# Patient Record
Sex: Male | Born: 1965 | Race: Black or African American | Hispanic: No | Marital: Single | Smoking: Never smoker
Health system: Southern US, Community
[De-identification: ages and names within clinical notes are randomized; demographics above are authoritative.]

## PROBLEM LIST (undated history)

## (undated) DIAGNOSIS — K219 Gastro-esophageal reflux disease without esophagitis: Secondary | ICD-10-CM

## (undated) DIAGNOSIS — M549 Dorsalgia, unspecified: Secondary | ICD-10-CM

## (undated) DIAGNOSIS — I480 Paroxysmal atrial fibrillation: Secondary | ICD-10-CM

## (undated) DIAGNOSIS — Z9989 Dependence on other enabling machines and devices: Secondary | ICD-10-CM

## (undated) DIAGNOSIS — I82621 Acute embolism and thrombosis of deep veins of right upper extremity: Secondary | ICD-10-CM

## (undated) DIAGNOSIS — I5189 Other ill-defined heart diseases: Secondary | ICD-10-CM

## (undated) DIAGNOSIS — G4733 Obstructive sleep apnea (adult) (pediatric): Secondary | ICD-10-CM

## (undated) DIAGNOSIS — K420 Umbilical hernia with obstruction, without gangrene: Secondary | ICD-10-CM

## (undated) DIAGNOSIS — E785 Hyperlipidemia, unspecified: Secondary | ICD-10-CM

## (undated) DIAGNOSIS — J45909 Unspecified asthma, uncomplicated: Secondary | ICD-10-CM

## (undated) DIAGNOSIS — I10 Essential (primary) hypertension: Secondary | ICD-10-CM

## (undated) DIAGNOSIS — Z8709 Personal history of other diseases of the respiratory system: Secondary | ICD-10-CM

## (undated) HISTORY — DX: Umbilical hernia with obstruction, without gangrene: K42.0

## (undated) HISTORY — DX: Dorsalgia, unspecified: M54.9

## (undated) HISTORY — PX: OTHER SURGICAL HISTORY: SHX169

## (undated) HISTORY — DX: Unspecified asthma, uncomplicated: J45.909

## (undated) HISTORY — DX: Other ill-defined heart diseases: I51.89

## (undated) HISTORY — DX: Paroxysmal atrial fibrillation: I48.0

## (undated) HISTORY — DX: Gastro-esophageal reflux disease without esophagitis: K21.9

## (undated) HISTORY — DX: Acute embolism and thrombosis of deep veins of right upper extremity: I82.621

## (undated) HISTORY — DX: Hyperlipidemia, unspecified: E78.5

## (undated) HISTORY — DX: Morbid (severe) obesity due to excess calories: E66.01

## (undated) HISTORY — PX: CYST REMOVAL NECK: SHX6281

## (undated) HISTORY — DX: Essential (primary) hypertension: I10

## (undated) HISTORY — DX: Dependence on other enabling machines and devices: Z99.89

---

## 2005-04-01 ENCOUNTER — Ambulatory Visit: Payer: Self-pay | Admitting: Family Medicine

## 2006-07-28 ENCOUNTER — Ambulatory Visit: Payer: Self-pay | Admitting: Family Medicine

## 2006-09-03 ENCOUNTER — Ambulatory Visit: Payer: Self-pay | Admitting: Family Medicine

## 2006-12-18 ENCOUNTER — Ambulatory Visit: Payer: Self-pay | Admitting: Family Medicine

## 2007-04-14 ENCOUNTER — Ambulatory Visit: Payer: Self-pay | Admitting: Family Medicine

## 2013-02-02 ENCOUNTER — Encounter: Payer: Self-pay | Admitting: *Deleted

## 2013-02-02 ENCOUNTER — Encounter: Payer: Self-pay | Admitting: Cardiology

## 2013-02-02 ENCOUNTER — Ambulatory Visit (INDEPENDENT_AMBULATORY_CARE_PROVIDER_SITE_OTHER): Payer: BC Managed Care – PPO | Admitting: Cardiology

## 2013-02-02 VITALS — BP 140/91 | HR 104 | Ht 71.0 in | Wt 278.0 lb

## 2013-02-02 DIAGNOSIS — R0602 Shortness of breath: Secondary | ICD-10-CM

## 2013-02-02 DIAGNOSIS — I10 Essential (primary) hypertension: Secondary | ICD-10-CM

## 2013-02-02 NOTE — Assessment & Plan Note (Signed)
I think this is probably secondary to his being extremely overweight and lack of conditioning. He could also be increasing his blood pressure remarkably with early exercise. I have arranged him to have an exercise treadmill with oximetry. I suspect that his heart rate and blood pressure would increase early into exercise. If this is unremarkable except for deconditioning, we will followup when necessary. I've made no changes in his medications.  Have encouraged him to lose weight and begin a walking program.

## 2013-02-02 NOTE — Assessment & Plan Note (Signed)
Most likely suboptimal control. He's been advised with weight loss, regular walking, and salt restriction. Recent med changes noted by Dr. Lysbeth Galas. Followup with primary care.

## 2013-02-02 NOTE — Patient Instructions (Addendum)
Your physician recommends that you schedule a follow-up appointment in: AS NEEDED  Your physician has requested that you have an exercise tolerance test. For further information please visit [email or web address] Please also follow instruction sheet, as given.WE WILL CALL YOU WITH THE RESULTS

## 2013-02-02 NOTE — Progress Notes (Signed)
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 Lysbeth Galas for increased dyspnea on exertion and some chest discomfort. He has noted increased shortness of breath early into activity over the past couple months. If he continues to walk he usually gets better. He denies any angina with this. He denies orthopnea, PND or edema. Echocardiogram in April 2013 was normal. There was no sign of pulmonary retention. He does have a history of hypertension which is not well-controlled today. He has gained a substantial amount weight in the last couple years. His BMI is over 35.  He denies any presyncope or syncope with exercise.  He has hyperlipidemia and labs reviewed from Dr. Lysbeth Galas office with patient. Recent med changes noted by Dr. Lysbeth Galas.  Past Medical History  Diagnosis Date  . HTN (hypertension)   . GERD (gastroesophageal reflux disease)   . Back pain     Current Outpatient Prescriptions  Medication Sig Dispense Refill  . atorvastatin (LIPITOR) 10 MG tablet Take 10 mg by mouth daily.      Marland Kitchen esomeprazole (NEXIUM) 40 MG capsule Take 40 mg by mouth daily before breakfast.      . furosemide (LASIX) 20 MG tablet Take 20 mg by mouth 2 (two) times daily.      Marland Kitchen losartan-hydrochlorothiazide (HYZAAR) 100-25 MG per tablet Take 1 tablet by mouth daily.      . meloxicam (MOBIC) 15 MG tablet Take 15 mg by mouth daily.      Marland Kitchen spironolactone (ALDACTONE) 25 MG tablet Take 25 mg by mouth daily.       No current facility-administered medications for this visit.    No Known Allergies  No family history on file.  History   Social History  . Marital Status: Single    Spouse Name: N/A    Number of Children: N/A  . Years of Education: N/A   Occupational History  . Not on file.   Social History Main Topics  . Smoking status: Never Smoker   . Smokeless tobacco: Not on file  . Alcohol Use: Yes     Comment: Socially  . Drug Use: Not on file  . Sexually Active:  Not on file   Other Topics Concern  . Not on file   Social History Narrative  . No narrative on file    ROS ALL NEGATIVE EXCEPT THOSE NOTED IN HPI  PE  General Appearance: well developed, well nourished in no acute distress, muscular, obese HEENT: symmetrical face, PERRLA, good dentition  Neck: no JVD, thyromegaly, or adenopathy, trachea midline Chest: symmetric without deformity Cardiac: PMI non-displaced, RRR, normal S1, S2, no gallop or murmur Lung: clear to ausculation and percussion Vascular: all pulses full without bruits  Abdominal: nondistended, nontender, good bowel sounds, no HSM, no bruits Extremities: no cyanosis, clubbing or edema, no sign of DVT, no varicosities  Skin: normal color, no rashes Neuro: alert and oriented x 3, non-focal Pysch: normal affect  EKG  Normal sinus rhythm, right bundle branch block.  BMET No results found for this basename: na, k, cl, co2, glucose, bun, creatinine, calcium, gfrnonaa, gfraa    Lipid Panel  No results found for this basename: chol, trig, hdl, cholhdl, vldl, ldlcalc    CBC No results found for this basename: wbc, rbc, hgb, hct, plt, mcv, mch, mchc, rdw, neutrabs, lymphsabs, monoabs, eosabs, basosabs

## 2013-02-02 NOTE — Assessment & Plan Note (Signed)
Good control

## 2013-02-02 NOTE — Assessment & Plan Note (Signed)
Noted on EKG but doubt of any clinical significance.

## 2013-02-04 ENCOUNTER — Ambulatory Visit (HOSPITAL_COMMUNITY)
Admission: RE | Admit: 2013-02-04 | Discharge: 2013-02-04 | Disposition: A | Discharge status: Home / Self Care | Payer: BC Managed Care – PPO | Source: Ambulatory Visit | Attending: Cardiology | Admitting: Cardiology

## 2013-02-04 DIAGNOSIS — R0609 Other forms of dyspnea: Secondary | ICD-10-CM | POA: Insufficient documentation

## 2013-02-04 DIAGNOSIS — R079 Chest pain, unspecified: Secondary | ICD-10-CM | POA: Insufficient documentation

## 2013-02-04 DIAGNOSIS — R0989 Other specified symptoms and signs involving the circulatory and respiratory systems: Secondary | ICD-10-CM | POA: Insufficient documentation

## 2013-02-04 NOTE — Progress Notes (Signed)
Stress Lab Nurses Notes - [facility]  ADEBAYO ENSMINGER 02/04/2013 Reason for doing test: Chest Pain and Dyspnea Type of test: Regular GTX Nurse performing test: Parke Poisson, RN Nuclear Medicine Tech: Not Applicable Echo Tech: Not Applicable MD performing test: Lovina Reach Family MD: Dr. Lysbeth Galas Test explained and consent signed: yes IV started: No IV started Symptoms: Mild SOB & O2 Sat resting 94% & exercise 87%-97% Treatment/Intervention: None Reason test stopped: fatigue and reached target HR After recovery IV was: Saline Lock flushed and NA Patient to return to Nuc. Med at :NA Patient discharged: Home Patient's Condition upon discharge was: stable Comments: During test peak BP 182/68 & HR 169.   Recovery BP 124/72 & HR 112.  Symptoms resolved in recovery. Erskine Speed T

## 2013-03-04 ENCOUNTER — Telehealth: Payer: Self-pay | Admitting: Cardiology

## 2013-03-04 NOTE — Telephone Encounter (Signed)
Pt states he has never been called with his stress test results/tmj

## 2013-03-07 NOTE — Telephone Encounter (Signed)
Noted MD Tenny Craw was to read report for MD Wall and noted pt GXT results were normal, will await pt return call to advise

## 2013-03-07 NOTE — Telephone Encounter (Signed)
Late entry: was advised by CR that this test has not been read at this point and will need to be read for pt to have results, left message for pt to be advised the MD has not reviewed the results at this time and when they are addressed we will give him a call back asap and to apologize for any inconvience,

## 2013-03-08 NOTE — Telephone Encounter (Signed)
Spoke to pt to advise results/instructions. Pt understood.  

## 2014-06-14 ENCOUNTER — Encounter: Payer: Self-pay | Admitting: Pulmonary Disease

## 2014-06-14 ENCOUNTER — Ambulatory Visit (INDEPENDENT_AMBULATORY_CARE_PROVIDER_SITE_OTHER)
Admission: RE | Admit: 2014-06-14 | Discharge: 2014-06-14 | Disposition: A | Discharge status: Home / Self Care | Payer: BC Managed Care – PPO | Source: Ambulatory Visit | Attending: Pulmonary Disease | Admitting: Pulmonary Disease

## 2014-06-14 ENCOUNTER — Ambulatory Visit (INDEPENDENT_AMBULATORY_CARE_PROVIDER_SITE_OTHER): Payer: BC Managed Care – PPO | Admitting: Pulmonary Disease

## 2014-06-14 ENCOUNTER — Encounter (INDEPENDENT_AMBULATORY_CARE_PROVIDER_SITE_OTHER): Payer: Self-pay

## 2014-06-14 VITALS — BP 130/76 | HR 92 | Ht 71.0 in | Wt 280.0 lb

## 2014-06-14 DIAGNOSIS — R0989 Other specified symptoms and signs involving the circulatory and respiratory systems: Secondary | ICD-10-CM

## 2014-06-14 DIAGNOSIS — R0602 Shortness of breath: Secondary | ICD-10-CM

## 2014-06-14 DIAGNOSIS — J4520 Mild intermittent asthma, uncomplicated: Secondary | ICD-10-CM | POA: Insufficient documentation

## 2014-06-14 DIAGNOSIS — R0609 Other forms of dyspnea: Secondary | ICD-10-CM

## 2014-06-14 DIAGNOSIS — J449 Chronic obstructive pulmonary disease, unspecified: Secondary | ICD-10-CM

## 2014-06-14 MED ORDER — AEROCHAMBER MV MISC
Status: DC
Start: 2014-06-14 — End: 2014-07-17

## 2014-06-14 NOTE — Assessment & Plan Note (Signed)
He was told as a child that he had asthma but he never required many doctor visits or a hospitalization. He was also told at last years pulmonary function testing was suggestive of asthma. His history is not entirely consistent with that and that there is no intermittent chest tightness or wheezing and there is no clear environmental triggers to his symptoms.  Plan: -As above will obtain records of pulmonary function test and will change Qvar to Symbicort for a trial.

## 2014-06-14 NOTE — Patient Instructions (Signed)
Take the Symbicort inhaler 2 puffs twice a day no matter how you feel with a spacer, let me know if it helps you breath better We will request the pulmonary function tests and the echocardiogram result from last year and will call you with the results Try to lose weight through diet and exercise We will see back in 3 months or sooner if needed

## 2014-06-14 NOTE — Assessment & Plan Note (Signed)
I explained to [name] that the differential diagnosis of shortness of breath is broad and includes lung disease, heart disease, obesity and deconditioning, and anemia as well as neuromuscular weakness. His lung exam was normal today which is reassuring, but he did have a slight drop in his O2 saturation on ambulation. He reached 90% which is a bit unexpected. Because of this, I would like to see the results of his recent echocardiogram as well as pulmonary function testing from about a year ago to see if there is any evidence of a pulmonary abnormality or a suggestion of pulmonary hypertension. I also obtain a chest x-ray today. It sounds like he has had a relatively thorough cardiac workup within the last year which was normal which is reassuring. Further, a hemoglobin level from last week was normal. I do not see evidence of neuromuscular weakness on physical exam today.  He has been told in the past that he has asthma, his history does not seem entirely consistent with this. However, if this is the case then I'm going to increase his inhaled therapy to Symbicort from Qvar to see if that seems to make a difference.  Overall, my strongest suspicion is that his shortness of breath is due to his obesity. He feels that this is likely as well.  Plan: -Obtain results of full pulmonary function testing and echocardiogram -Chest imaging today with a chest x-ray -If pulmonary function testing is abnormal we may need to perform a CT scan of his chest -Stop Qvar -Add Symbicort with a spacer for a trial. -I. encouraged regular exercise and diet and weight loss.

## 2014-06-14 NOTE — Progress Notes (Signed)
Subjective:    Patient ID: Kyle Burns, male    DOB: 09/22/1966, 48 y.o.   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 As a child he was told that he had asthma but he never required doctor visits for this or hospitalizations. Apparently this went away and was never a problem for him as a young adult. As a young adult he occasionally would smoke cigarettes when he was out with friends drinking alcohol but this was never a habitual problem and he has not done that in several decades.  He has no shortness of breath for at least the last year or so he believes that it may have increased during that time. He has felt that it is most likely due to to his obesity but he is here to see me today to try to figure out if there is anything else done on. He says that he gets short of breath when he walks across the parking lot at a brisk pace or if he climbs a flight of stairs. He said that earlier this week he had to climb 3 flights of stairs in the [place] cour[place] and he said he felt like he was about to pass out at home he reads the top. He has not been exercising regularly because of shortness of breath. For diet he has been able to lose 10 pounds over the last month or so. He says that his shortness of breath is worse when he bends over and he will sometimes no wheezing when he bends over. He denies chest pain or chest tightness or wheezing other than when bending over. He does not have environmental triggers which will make his dyspnea worse.  For the last month or so he has been taking Qvar which he says does not make a difference in his breathing. He has not been using it with a spacer but he has been using it regularly twice a day. He also takes albuterol as needed and he says that this has not made a difference in his breathing either.  Last year he had a cardiac workup which included an echocardiogram and  a stress test which apparently were normal.  Past Medical History  Diagnosis Date  . HTN (hypertension)   . GERD (gastroesophageal reflux disease)   . Back pain      Family History  Problem Relation Age of Onset  . Asthma Kyle Burns   . Asthma Kyle Burns   . Asthma Kyle Burns   . Cancer Kyle Burns     stomach     History   Social History  . Marital Status: Single    Spouse Name: N/A    Number of Children: N/A  . Years of Education: N/A   Occupational History  . Not on file.   Social History Main Topics  . Smoking status: Never Smoker   . Smokeless tobacco: Current User    Types: Chew     Comment: Uses chew "when he has a bad day"  . Alcohol Use: No     Comment: Socially  . Drug Use: Not on file  . Sexual Activity: Not on file   Other Topics Concern  . Not on file   Social History Narrative  . No narrative on file     No Known Allergies   Outpatient Prescriptions Prior to Visit  Medication Sig Dispense Refill  .  atorvastatin (LIPITOR) 10 MG tablet Take 10 mg by mouth daily.      Marland Kitchen. esomeprazole (NEXIUM) 40 MG capsule Take 40 mg by mouth daily before breakfast.      . furosemide (LASIX) 20 MG tablet Take 20 mg by mouth 2 (two) times daily.      Marland Kitchen. losartan-hydrochlorothiazide (HYZAAR) 100-25 MG per tablet Take 1 tablet by mouth daily.      . meloxicam (MOBIC) 15 MG tablet Take 15 mg by mouth daily.      Marland Kitchen. spironolactone (ALDACTONE) 25 MG tablet Take 25 mg by mouth daily.       No facility-administered medications prior to visit.      Review of Systems  Constitutional: Negative for fever and unexpected weight change.  HENT: Positive for postnasal drip and sinus pressure. Negative for congestion, dental problem, ear pain, nosebleeds, rhinorrhea, sneezing, sore throat and trouble swallowing.   Eyes: Negative for redness and itching.  Respiratory: Positive for chest tightness and shortness of breath. Negative for cough and wheezing.     Cardiovascular: Negative for palpitations and leg swelling.  Gastrointestinal: Negative for nausea and vomiting.  Genitourinary: Negative for dysuria.  Musculoskeletal: Negative for joint swelling.  Skin: Negative for rash.  Neurological: Negative for headaches.  Hematological: Does not bruise/bleed easily.  Psychiatric/Behavioral: Negative for dysphoric mood. The patient is not nervous/anxious.        Objective:   Physical Exam  Filed Vitals:   06/14/14 1440  BP: 130/76  Pulse: 92  Height: 5\' 11"  (1.803 m)  Weight: 280 lb (127.007 kg)  SpO2: 96%   RA  He ambulated 500 feet today in the office and at the end of that he became short of breath, tachycardic and his oxygen saturation dropped to 90% on room air  Gen: obese but well appearing, no acute distress HEENT: NCAT, PERRL, EOMi, OP clear, neck supple without masses PULM: CTA B CV: RRR, no mgr, no JVD AB: BS+, soft, nontender, no hsm Ext: warm, trace leg edema, no clubbing, no cyanosis Derm: no rash or skin breakdown Neuro: A&Ox4, CN II-XII intact, strength 5/5 in all 4 extremities       Assessment & Plan:   Dyspnea on exertion I explained to Kyle Burns that the differential diagnosis of shortness of breath is broad and includes lung disease, heart disease, obesity and deconditioning, and anemia as well as neuromuscular weakness. His lung exam was normal today which is reassuring, but he did have a slight drop in his O2 saturation on ambulation. He reached 90% which is a bit unexpected. Because of this, I would like to see the results of his recent echocardiogram as well as pulmonary function testing from about a year ago to see if there is any evidence of a pulmonary abnormality or a suggestion of pulmonary hypertension. I also obtain a chest x-ray today. It sounds like he has had a relatively thorough cardiac workup within the last year which was normal which is reassuring. Further, a hemoglobin level from last week was  normal. I do not see evidence of neuromuscular weakness on physical exam today.  He has been told in the past that he has asthma, his history does not seem entirely consistent with this. However, if this is the case then I'm going to increase his inhaled therapy to Symbicort from Qvar to see if that seems to make a difference.  Overall, my strongest suspicion is that his shortness of breath is due to his obesity. He  feels that this is likely as well.  Plan: -Obtain results of full pulmonary function testing and echocardiogram -Chest imaging today with a chest x-ray -If pulmonary function testing is abnormal we may need to perform a CT scan of his chest -Stop Qvar -Add Symbicort with a spacer for a trial. -I. encouraged regular exercise and diet and weight loss.  Chronic obstructive asthma, unspecified He was told as a child that he had asthma but he never required many doctor visits or a hospitalization. He was also told at last years pulmonary function testing was suggestive of asthma. His history is not entirely consistent with that and that there is no intermittent chest tightness or wheezing and there is no clear environmental triggers to his symptoms.  Plan: -As above will obtain records of pulmonary function test and will change Qvar to Symbicort for a trial.    Updated Medication List Outpatient Encounter Prescriptions as of 06/14/2014  Medication Sig  . aspirin 81 MG tablet Take 81 mg by mouth daily.  Marland Kitchen. atorvastatin (LIPITOR) 10 MG tablet Take 10 mg by mouth daily.  Marland Kitchen. esomeprazole (NEXIUM) 40 MG capsule Take 40 mg by mouth daily before breakfast.  . furosemide (LASIX) 20 MG tablet Take 20 mg by mouth 2 (two) times daily.  Marland Kitchen. losartan-hydrochlorothiazide (HYZAAR) 100-25 MG per tablet Take 1 tablet by mouth daily.  . meloxicam (MOBIC) 15 MG tablet Take 15 mg by mouth daily.  Marland Kitchen. spironolactone (ALDACTONE) 25 MG tablet Take 25 mg by mouth daily.

## 2014-06-15 ENCOUNTER — Telehealth: Payer: Self-pay

## 2014-06-15 DIAGNOSIS — J9811 Atelectasis: Secondary | ICD-10-CM

## 2014-06-15 NOTE — Telephone Encounter (Signed)
Spoke with pt, he is aware of results and recs.  A records request to Dr. Joyce CopaNyland's office was sent yesterday, I will continue to follow up on this.  Order placed for sniff test.  Nothing further needed at this time.

## 2014-06-15 NOTE — Telephone Encounter (Signed)
Message copied by Arnetta Odeh L on Thu Jun 15, 2014 11:13 AM ------      Message from: Max FickleMCQUAID, DOUGLAS B      Created: Wed Jun 14, 2014 10:09 PM       A,            Please let him know that I was surprised to see that his left diaphragm looks like it doesn't work (seen on the CXR).  He needs to have a diaphragm fluoroscopy to evaluate this further (to see if it works or not) and we still need to track down all the tests we talked about in his visit.            Thanks      B ------

## 2014-06-19 ENCOUNTER — Telehealth: Payer: Self-pay | Admitting: Cardiology

## 2014-06-19 NOTE — Telephone Encounter (Signed)
Received 7 pages from [facility] [place]. Since patient was seen by Dr. Daleen SquibbWall at [facility] sent in interoffice envelope. 06/19/14/ss.

## 2014-06-20 ENCOUNTER — Encounter: Payer: Self-pay | Admitting: Pulmonary Disease

## 2014-06-20 ENCOUNTER — Ambulatory Visit (HOSPITAL_COMMUNITY)
Admission: RE | Admit: 2014-06-20 | Discharge: 2014-06-20 | Disposition: A | Discharge status: Home / Self Care | Payer: BC Managed Care – PPO | Source: Ambulatory Visit | Attending: Pulmonary Disease | Admitting: Pulmonary Disease

## 2014-06-20 DIAGNOSIS — R0602 Shortness of breath: Secondary | ICD-10-CM | POA: Insufficient documentation

## 2014-06-20 DIAGNOSIS — J9811 Atelectasis: Secondary | ICD-10-CM

## 2014-06-22 ENCOUNTER — Telehealth: Payer: Self-pay | Admitting: Pulmonary Disease

## 2014-06-22 NOTE — Telephone Encounter (Signed)
A, Please let him know that this showed that this showed that his left diaphragm doesn't move. We will discuss more in the next clinic visit. Thanks B   I spoke with patient about results and he verbalized understanding. Pt is not due to follow up until September. He wants to know if this is okay to wait until then? Please advise thanks

## 2014-06-22 NOTE — Telephone Encounter (Signed)
I called made pt aware. Nothing further needed 

## 2014-06-22 NOTE — Telephone Encounter (Signed)
OK to wait until then

## 2014-07-16 ENCOUNTER — Other Ambulatory Visit: Payer: Self-pay | Admitting: Pulmonary Disease

## 2014-07-17 MED ORDER — AEROCHAMBER MV MISC: Status: DC

## 2014-09-12 ENCOUNTER — Encounter: Payer: Self-pay | Admitting: Pulmonary Disease

## 2014-09-12 ENCOUNTER — Ambulatory Visit (INDEPENDENT_AMBULATORY_CARE_PROVIDER_SITE_OTHER): Payer: BC Managed Care – PPO | Admitting: Pulmonary Disease

## 2014-09-12 VITALS — BP 118/66 | HR 88 | Ht 71.0 in | Wt 289.0 lb

## 2014-09-12 DIAGNOSIS — R0989 Other specified symptoms and signs involving the circulatory and respiratory systems: Secondary | ICD-10-CM

## 2014-09-12 DIAGNOSIS — R0609 Other forms of dyspnea: Secondary | ICD-10-CM

## 2014-09-12 DIAGNOSIS — J449 Chronic obstructive pulmonary disease, unspecified: Secondary | ICD-10-CM

## 2014-09-12 NOTE — Assessment & Plan Note (Signed)
He has asthma which has been a problem for years. However, his symptoms do not sound like bronchospasm so I suspect that he on he has mild asthma.  Plan: -Continue Symbicort for now -Obtain pulmonary function testing

## 2014-09-12 NOTE — Progress Notes (Signed)
Subjective:    Patient ID: Kyle Burns, male    DOB: 1966-08-15, 48 y.o.   MRN: [id number]  Synopsis: This is a very pleasant lifelong nonsmoker who first saw [facility] in 2015 for evaluation of shortness of breath. He had previously been seen by cardiology and had a normal stress test in 2015. He also has a history of a normal echocardiogram in 2013. He was told as a child that he has asthma. A chest x-ray in 2015 demonstrated a left hemidiaphragm elevation. Followup fluoroscopy of the diaphragm showed no movement of the left diaphragm. He has no history of trauma.  HPI  09/12/2014 ROV > Mr. Bua thinks that his breathing is about the same. When he is walking he gets short of breath.  Not having to stop at all.  Minimal cough.  No chest pain.  He has been using the Symbicort and he says that it makes his breathing better than the Qvar. He is using it twice a day. He says that his shortness of breath has not worsened but also has not improved. He continues to feel the symptom. He has not lost weight.  Past Medical History  Diagnosis Date  . HTN (hypertension)   . GERD (gastroesophageal reflux disease)   . Back pain      Review of Systems  Constitutional: Negative for fever, chills and fatigue.  HENT: Negative for rhinorrhea, sinus pressure and sneezing.   Respiratory: Positive for shortness of breath. Negative for cough and wheezing.   Cardiovascular: Negative for chest pain, palpitations and leg swelling.       Objective:   Physical Exam Filed Vitals:   09/12/14 1110  BP: 118/66  Pulse: 88  Height: 5\' 11"  (1.803 m)  Weight: 289 lb (131.09 kg)  SpO2: 94%   RA  Gen: obese but well appearing, no acute distress HEENT: NCAT,  EOMi, OP clear, thick neck PULM: Diminished left base otherwise clear CV: RRR, no mgr, no JVD AB: BS+, soft, nontender Ext: warm, no edema, no clubbing, no cyanosis Derm: no rash or skin breakdown Neuro: A&Ox4, MAEW     Assessment &  Plan:   Left hemidiaphragm paralysis I was surprised to find an elevated left hemidiaphragm and evidence of left hemidiaphragm paralysis on the chest x-ray and followup fluoroscopy performed after our last visit. Typically this problem is associated with trauma of which he has no history of any point in his life. Sometimes it can be associated with a viral illness, cervical stenosis, or less likely thoracic malignancy.  It certainly is contributing to his shortness of breath considering his obesity. Today we talked about the fact that it is exceedingly important for him to lose weight. Diaphragm plication surgery has not really been shown to be associated with improvement in symptoms and in someone like him I would never consider this until he loses a significant amount of weight.  Plan: -Lose weight, lose weight, lose weight -Pulmonary function test to evaluate severity of restriction -CT chest to ensure that there is no evidence of thoracic malignancy causing phrenic nerve compression -Followup 6 months  Chronic obstructive asthma, unspecified He has asthma which has been a problem for years. However, his symptoms do not sound like bronchospasm so I suspect that he on he has mild asthma.  Plan: -Continue Symbicort for now -Obtain pulmonary function testing  Morbid obesity I believe that this is the root of all of his respiratory complaints. Today we talked at length about ways  to lose weight including exercise, diet and various weight loss programs. He was educated on calorie counting and we discussed joining a group like [employer].    Updated Medication List Outpatient Encounter Prescriptions as of 09/12/2014  Medication Sig  . aspirin 81 MG tablet Take 81 mg by mouth daily.  Marland Kitchen. atorvastatin (LIPITOR) 10 MG tablet Take 10 mg by mouth daily.  Marland Kitchen. esomeprazole (NEXIUM) 40 MG capsule Take 40 mg by mouth daily before breakfast.  . furosemide (LASIX) 20 MG tablet Take 20 mg by mouth 2  (two) times daily.  Marland Kitchen. losartan-hydrochlorothiazide (HYZAAR) 100-25 MG per tablet Take 1 tablet by mouth daily.  . meloxicam (MOBIC) 15 MG tablet Take 15 mg by mouth daily.  Marland Kitchen. Spacer/Aero-Holding Chambers (AEROCHAMBER MV) inhaler Use as instructed  . spironolactone (ALDACTONE) 25 MG tablet Take 25 mg by mouth daily.

## 2014-09-12 NOTE — Assessment & Plan Note (Signed)
I believe that this is the root of all of his respiratory complaints. Today we talked at length about ways to lose weight including exercise, diet and various weight loss programs. He was educated on calorie counting and we discussed joining a group like [employer].

## 2014-09-12 NOTE — Patient Instructions (Signed)
We will arrange a lung function test and CT [facility] Do your best to lose weight (goal 30 pounds) with diet and exercise We will see you back in 6 months or sooner if needed

## 2014-09-12 NOTE — Assessment & Plan Note (Signed)
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 Typically this problem is associated with trauma of which he has no history of any point in his life. Sometimes it can be associated with a viral illness, cervical stenosis, or less likely thoracic malignancy.  It certainly is contributing to his shortness of breath considering his obesity. Today we talked about the fact that it is exceedingly important for him to lose weight. Diaphragm plication surgery has not really been shown to be associated with improvement in symptoms and in someone like him I would never consider this until he loses a significant amount of weight.  Plan: -Lose weight, lose weight, lose weight -Pulmonary function test to evaluate severity of restriction -CT chest to ensure that there is no evidence of thoracic malignancy causing phrenic nerve compression -Followup 6 months

## 2014-09-18 ENCOUNTER — Ambulatory Visit (HOSPITAL_COMMUNITY)
Admission: RE | Admit: 2014-09-18 | Discharge: 2014-09-18 | Disposition: A | Discharge status: Home / Self Care | Payer: BC Managed Care – PPO | Source: Ambulatory Visit | Attending: Pulmonary Disease | Admitting: Pulmonary Disease

## 2014-09-18 ENCOUNTER — Encounter: Payer: Self-pay | Admitting: Pulmonary Disease

## 2014-09-18 DIAGNOSIS — R0600 Dyspnea, unspecified: Secondary | ICD-10-CM | POA: Diagnosis not present

## 2014-09-18 DIAGNOSIS — J986 Disorders of diaphragm: Secondary | ICD-10-CM | POA: Diagnosis present

## 2014-09-18 DIAGNOSIS — R0602 Shortness of breath: Secondary | ICD-10-CM | POA: Diagnosis not present

## 2014-09-18 DIAGNOSIS — R0609 Other forms of dyspnea: Secondary | ICD-10-CM

## 2014-09-18 LAB — PULMONARY FUNCTION TEST
DL/VA % pred: 132 %
DL/VA: 6.23 ml/min/mmHg/L
DLCO COR: 21.96 ml/min/mmHg
DLCO cor % pred: 65 %
DLCO unc % pred: 65 %
DLCO unc: 21.96 ml/min/mmHg
FEF 25-75 Post: 2.33 L/sec
FEF 25-75 Pre: 2.49 L/sec
FEF2575-%CHANGE-POST: -6 %
FEF2575-%Pred-Post: 68 %
FEF2575-%Pred-Pre: 72 %
FEV1-%Change-Post: -2 %
FEV1-%PRED-POST: 56 %
FEV1-%Pred-Pre: 57 %
FEV1-Post: 1.96 L
FEV1-Pre: 2.01 L
FEV1FVC-%Change-Post: 2 %
FEV1FVC-%PRED-PRE: 108 %
FEV6-%Change-Post: -5 %
FEV6-%PRED-PRE: 54 %
FEV6-%Pred-Post: 51 %
FEV6-POST: 2.2 L
FEV6-PRE: 2.31 L
FEV6FVC-%PRED-POST: 103 %
FEV6FVC-%PRED-PRE: 103 %
FVC-%Change-Post: -5 %
FVC-%PRED-PRE: 53 %
FVC-%Pred-Post: 50 %
FVC-PRE: 2.31 L
FVC-Post: 2.2 L
Post FEV1/FVC ratio: 89 %
Post FEV6/FVC ratio: 100 %
Pre FEV1/FVC ratio: 87 %
Pre FEV6/FVC Ratio: 100 %
RV % pred: 104 %
RV: 2.19 L
TLC % pred: 67 %
TLC: 4.85 L

## 2014-09-18 MED ORDER — ALBUTEROL SULFATE (2.5 MG/3ML) 0.083% IN NEBU
2.5000 mg | INHALATION_SOLUTION | Freq: Once | RESPIRATORY_TRACT | Status: AC
  Administered 2014-09-18: 2.5 mg via RESPIRATORY_TRACT

## 2014-09-19 ENCOUNTER — Telehealth: Payer: Self-pay | Admitting: Pulmonary Disease

## 2014-09-19 NOTE — Telephone Encounter (Signed)
Notes Recorded by Lupita Leashouglas B McQuaid, MD on 09/18/2014 at 9:04 PM A, Please let him know that his CT chest did not show anything worrisome which is good. Also, his PFT did not show severe asthma which is also good Thanks B  Pt advised of Dr. Edwyna ShellMcquaid'ss results. The pt had a question about the 3mm nodule that hte CT scan report mentioned. I explained what a nodule was and that it was not a concern per Dr. Kendrick FriesMcquaid. The pt then asked about the following: Upper Abdomen: Diffuse low-attenuation of the liver parenchyma is  compatible with steatosis.  I advised I did not know much about this and will send a message to Dr. Kendrick FriesMcquaid to clarify what this means. Please advise. Carron CurieJennifer Ana Woodroof, CMA

## 2014-09-20 NOTE — Telephone Encounter (Signed)
It means fatty liver.  Can be due to many things, best to discuss with me or PCP in person

## 2014-09-20 NOTE — Telephone Encounter (Signed)
Called spoke with patient and advised of BQ's response regarding questions about CT.  Pt voiced his understanding and stated that he would like to see BQ to discuss in person rather than PCP.  Pt stated he would like to call back to schedule appt, advised this will be fine.  Nothing further needed at this time; will sign off.

## 2015-01-04 ENCOUNTER — Other Ambulatory Visit: Payer: Self-pay | Admitting: Pulmonary Disease

## 2015-01-08 MED ORDER — AEROCHAMBER MV MISC: Status: DC

## 2015-02-08 ENCOUNTER — Ambulatory Visit (INDEPENDENT_AMBULATORY_CARE_PROVIDER_SITE_OTHER)
Admission: RE | Admit: 2015-02-08 | Discharge: 2015-02-08 | Disposition: A | Discharge status: Home / Self Care | Payer: BLUE CROSS/BLUE SHIELD | Source: Ambulatory Visit | Attending: Adult Health | Admitting: Adult Health

## 2015-02-08 ENCOUNTER — Ambulatory Visit (INDEPENDENT_AMBULATORY_CARE_PROVIDER_SITE_OTHER): Payer: BLUE CROSS/BLUE SHIELD | Admitting: Adult Health

## 2015-02-08 ENCOUNTER — Encounter: Payer: Self-pay | Admitting: Adult Health

## 2015-02-08 VITALS — BP 118/86 | HR 92 | Temp 98.3°F | Ht 71.0 in | Wt 293.8 lb

## 2015-02-08 DIAGNOSIS — R059 Cough, unspecified: Secondary | ICD-10-CM

## 2015-02-08 DIAGNOSIS — R05 Cough: Secondary | ICD-10-CM

## 2015-02-08 DIAGNOSIS — J449 Chronic obstructive pulmonary disease, unspecified: Secondary | ICD-10-CM

## 2015-02-08 NOTE — Progress Notes (Signed)
   Subjective:    Patient ID: Kyle Burns, male    DOB: Dec 05, 1966, 49 y.o.   MRN: [id number]  HPI Synopsis: This is a very pleasant lifelong nonsmoker who first saw [facility] in 2015 for evaluation of shortness of breath. He had previously been seen by cardiology and had a normal stress test in 2015. He also has a history of a normal echocardiogram in 2013. He was told as a child that he has asthma. A chest x-ray in 2015 demonstrated a left hemidiaphragm elevation. Followup fluoroscopy of the diaphragm showed no movement of the left diaphragm. He has no history of trauma.  02/08/2015 Acute OV : obesity /left hemidiaphragm paralysis  Presents for an acute office visit.  Complains of cough, congestion, drainage for 2-3 weeks. Seen by PCP , given Zpack.  Has been using Hydromet, Tessalon, Nasacort, . Cough got better but never went away.  Went back to PCP last week , given zpack and prednisone.  Has few days left of the prednisone.  He denies any hemoptysis, chest pain, orthopnea, PND or leg swelling Has some Tessalon and Hydromet that he is using for cough. Emergency planning/management officerolice officer.        Review of Systems Constitutional:   No  weight loss, night sweats,  Fevers, chills,  +fatigue, or  lassitude.  HEENT:   No headaches,  Difficulty swallowing,  Tooth/dental problems, or  Sore throat,                No sneezing, itching, ear ache, nasal congestion, post nasal drip,   CV:  No chest pain,  Orthopnea, PND, swelling in lower extremities, anasarca, dizziness, palpitations, syncope.   GI  No heartburn, indigestion, abdominal pain, nausea, vomiting, diarrhea, change in bowel habits, loss of appetite, bloody stools.   Resp: .  No chest wall deformity  Skin: no rash or lesions.  GU: no dysuria, change in color of urine, no urgency or frequency.  No flank pain, no hematuria   MS:  No joint pain or swelling.  No decreased range of motion.  No back pain.  Psych:  No change in mood or  affect. No depression or anxiety.  No memory loss.         Objective:   Physical Exam GEN: A/Ox3; pleasant , NAD, well nourished   HEENT:  [place]/AT,  EACs-clear, TMs-wnl, NOSE-clear drainage , THROAT-clear, no lesions, no postnasal drip or exudate noted. Class 2-3 airway   NECK:  Supple w/ fair ROM; no JVD; normal carotid impulses w/o bruits; no thyromegaly or nodules palpated; no lymphadenopathy.  RESP  Clear  P & A; w/o, wheezes/ rales/ or rhonchi.no accessory muscle use, no dullness to percussion  CARD:  RRR, no m/r/g  , no peripheral edema, pulses intact, no cyanosis or clubbing.  GI:   Soft & nt; nml bowel sounds; no organomegaly or masses detected.  Musco: Warm bil, no deformities or joint swelling noted.   Neuro: alert, no focal deficits noted.    Skin: Warm, no lesions or rashes         Assessment & Plan:

## 2015-02-08 NOTE — Assessment & Plan Note (Signed)
Exacerbation -slow to resolve  Check cxr    Finish prednisone taper Begin Delsym 2 teaspoons twice daily as needed for cough May use Tessalon Perles 3 times daily as needed for cough Use Hydromet as directed as needed for cough, may make you sleepy Begin Zyrtec 10 mg at bedtime for 2 weeks, then as needed for drainage Use sips of water and sugarless candy to avoid throat clearing and coughing Avoid all mint products Add Pepcid 20 mg at bedtime for 2 weeks. Chest x-ray today Follow-up with Dr. Kathrin PennerMcQuade next month as planned and as needed Please contact office for sooner follow up if symptoms do not improve or worsen or seek emergency care

## 2015-02-08 NOTE — Patient Instructions (Signed)
Finish prednisone taper Begin Delsym 2 teaspoons twice daily as needed for cough May use Tessalon Perles 3 times daily as needed for cough Use Hydromet as directed as needed for cough, may make you sleepy Begin Zyrtec 10 mg at bedtime for 2 weeks, then as needed for drainage Use sips of water and sugarless candy to avoid throat clearing and coughing Avoid all mint products Add Pepcid 20 mg at bedtime for 2 weeks. Chest x-ray today Follow-up with Dr. Kathrin PennerMcQuade next month as planned and as needed Please contact office for sooner follow up if symptoms do not improve or worsen or seek emergency care

## 2015-02-09 NOTE — Progress Notes (Signed)
Quick Note:  LMOM TCB x1. ______ 

## 2015-02-11 NOTE — Progress Notes (Signed)
I agree with the plan outlined above.    

## 2015-02-19 ENCOUNTER — Ambulatory Visit (INDEPENDENT_AMBULATORY_CARE_PROVIDER_SITE_OTHER): Payer: BLUE CROSS/BLUE SHIELD | Admitting: Pulmonary Disease

## 2015-02-19 ENCOUNTER — Encounter: Payer: Self-pay | Admitting: Pulmonary Disease

## 2015-02-19 VITALS — BP 144/88 | HR 89 | Temp 97.1°F | Ht 71.0 in | Wt 293.6 lb

## 2015-02-19 DIAGNOSIS — J986 Disorders of diaphragm: Secondary | ICD-10-CM

## 2015-02-19 DIAGNOSIS — R0600 Dyspnea, unspecified: Secondary | ICD-10-CM

## 2015-02-19 DIAGNOSIS — J449 Chronic obstructive pulmonary disease, unspecified: Secondary | ICD-10-CM

## 2015-02-19 MED ORDER — BENZONATATE 200 MG PO CAPS: 200.0000 mg | ORAL_CAPSULE | Freq: Three times a day (TID) | ORAL | Status: DC | PRN

## 2015-02-19 MED ORDER — HYDROCODONE-HOMATROPINE 5-1.5 MG/5ML PO SYRP: 5.0000 mL | ORAL_SOLUTION | Freq: Four times a day (QID) | ORAL | Status: DC | PRN

## 2015-02-19 MED ORDER — BUDESONIDE-FORMOTEROL FUMARATE 80-4.5 MCG/ACT IN AERO: 2.0000 | INHALATION_SPRAY | Freq: Two times a day (BID) | RESPIRATORY_TRACT | Status: DC

## 2015-02-19 NOTE — Patient Instructions (Signed)
Continue taking the symbicort as you are doing We will arrange a lung function [facility] in [facility]  Exercise regularly and lose weight  For the cough Take the tessalon as needed during the daytime and hydromet at night You need to try to suppress your cough to allow your larynx (voice box) to heal.  For three days don't talk, laugh, sing, or clear your throat. Do everything you can to suppress the cough during this time. Use hard candies (sugarless Jolly Ranchers) or non-mint or non-menthol containing cough drops during this time to soothe your throat.  Use a cough suppressant (Delsym or what I have prescribed you) around the clock during this time.  After three days, gradually increase the use of your voice and back off on the cough suppressants.  We will see you back in 6 months or sooner if needed

## 2015-02-19 NOTE — Assessment & Plan Note (Addendum)
This has been a stable interval but he has not made any headway with weight loss. I again strongly encouraged weight loss through diet and exercise.  He will see a significant improvement in his breathing if he can lose weight.  Plan: -[facility]

## 2015-02-19 NOTE — Progress Notes (Signed)
Subjective:    Patient ID: Kyle Burns, male    DOB: 12/10/1966, 49 y.o.   MRN: [id number]  Synopsis: This is a very pleasant lifelong nonsmoker who first saw [facility] in 2015 for evaluation of shortness of breath. He had previously been seen by cardiology and had a normal stress test in 2015. He also has a history of a normal echocardiogram in 2013. He was told as a child that he has asthma. A chest x-ray in 2015 demonstrated a left hemidiaphragm elevation. Followup fluoroscopy of the diaphragm showed no movement of the left diaphragm. He has no history of trauma.  HPI  Chief Complaint  Patient presents with  . Follow-up    Pt denies any breathing issues since last OV. Pt reports trying to exercise more frequently. Pt states that at times it is difficult to get a full breath in when SOB.    02/19/2015 ROV> [name] is breathing better but he still has a lingering dry cough.  He says the cough is worse during theh daytime but not too bad at night.  It is worse when he is talking a lot with work.  His breathing has been OK lately with the esception of one episdoe when he had to chase someone at work.  He has been trying to walk more at work.  He has been trying to exercise more and diet.  He has not been using his dip lately.  He does not have a sinus drainage any longer . He does not have much heart burn.  No nighttime cough.  He uses his nasacort regularly.    Past Medical History  Diagnosis Date  . HTN (hypertension)   . GERD (gastroesophageal reflux disease)   . Back pain      Review of Systems  Constitutional: Negative for fever, chills and fatigue.  HENT: Negative for rhinorrhea, sinus pressure and sneezing.   Respiratory: Positive for shortness of breath. Negative for cough and wheezing.   Cardiovascular: Negative for chest pain, palpitations and leg swelling.       Objective:   Physical Exam Filed Vitals:   02/19/15 1546  BP: 144/88  Pulse: 89  Temp: 97.1 F  (36.2 C)  TempSrc: Oral  Height: 5\' 11"  (1.803 m)  Weight: 293 lb 9.6 oz (133.176 kg)  SpO2: 93%   RA  Gen: obese but well appearing, no acute distress HEENT: NCAT,  EOMi, OP clear, thick neck PULM: Diminished left base otherwise clear CV: RRR, no mgr, no JVD AB: BS+, soft, nontender Ext: warm, no edema, no clubbing, no cyanosis Derm: no rash or skin breakdown Neuro: A&Ox4, MAEW     Assessment & Plan:   Left hemidiaphragm paralysis This has been a stable interval but he has not made any headway with weight loss. I again strongly encouraged weight loss through diet and exercise.  He will see a significant improvement in his breathing if he can lose weight.  Plan: -[place] P[place] [facility]   Chronic obstructive airway disease with asthma His asthma was flaring up recently and he now has a lingering dry cough without wheezing or dyspnea.  His cough is exacerbated by GERD and post nasal drip, both of which are well controlled on his current regimen.    He has ongoing laryngeal irritation causing cyclical cough which will only improve if he can rest his voice.  Plan: Continue symbicort Voice rest Tessalon prn cough Tussionex prn cough   >  25 minutes spent today with the patient and his mother  Updated Medication List Outpatient Encounter Prescriptions as of 02/19/2015  Medication Sig  . aspirin 81 MG tablet Take 81 mg by mouth daily.  Marland Kitchen atorvastatin (LIPITOR) 10 MG tablet Take 10 mg by mouth daily.  Marland Kitchen esomeprazole (NEXIUM) 40 MG capsule Take 40 mg by mouth daily before breakfast.  . furosemide (LASIX) 20 MG tablet Take 20 mg by mouth daily.   Marland Kitchen losartan-hydrochlorothiazide (HYZAAR) 100-25 MG per tablet Take 1 tablet by mouth daily.  . meloxicam (MOBIC) 15 MG tablet Take 15 mg by mouth daily.  Marland Kitchen Spacer/Aero-Holding Chambers (AEROCHAMBER MV) inhaler Use as instructed  . spironolactone (ALDACTONE) 25 MG tablet Take 25 mg by mouth daily.  . benzonatate  (TESSALON) 200 MG capsule Take 1 capsule (200 mg total) by mouth 3 (three) times daily as needed for cough.  . budesonide-formoterol (SYMBICORT) 80-4.5 MCG/ACT inhaler Inhale 2 puffs into the lungs 2 (two) times daily.  Marland Kitchen HYDROcodone-homatropine (HYCODAN) 5-1.5 MG/5ML syrup Take 5 mLs by mouth every 6 (six) hours as needed for cough.  . [DISCONTINUED] predniSONE (DELTASONE) 20 MG tablet Take 20 mg by mouth daily.

## 2015-02-19 NOTE — Assessment & Plan Note (Signed)
His asthma was flaring up recently and he now has a lingering dry cough without wheezing or dyspnea.  His cough is exacerbated by GERD and post nasal drip, both of which are well controlled on his current regimen.    He has ongoing laryngeal irritation causing cyclical cough which will only improve if he can rest his voice.  Plan: Continue symbicort Voice rest Tessalon prn cough Tussionex prn cough

## 2015-04-04 ENCOUNTER — Encounter (HOSPITAL_COMMUNITY): Payer: BLUE CROSS/BLUE SHIELD

## 2015-04-06 ENCOUNTER — Encounter (HOSPITAL_COMMUNITY): Payer: BLUE CROSS/BLUE SHIELD

## 2015-04-06 ENCOUNTER — Ambulatory Visit (HOSPITAL_COMMUNITY)
Admission: RE | Admit: 2015-04-06 | Discharge: 2015-04-06 | Disposition: A | Discharge status: Home / Self Care | Payer: BLUE CROSS/BLUE SHIELD | Source: Ambulatory Visit | Attending: Pulmonary Disease | Admitting: Pulmonary Disease

## 2015-04-06 DIAGNOSIS — R0600 Dyspnea, unspecified: Secondary | ICD-10-CM | POA: Diagnosis not present

## 2015-04-06 LAB — PULMONARY FUNCTION TEST
DL/VA % PRED: 111 %
DL/VA: 5.23 ml/min/mmHg/L
DLCO unc % pred: 58 %
DLCO unc: 19.79 ml/min/mmHg
FEF 25-75 Post: 2.38 L/sec
FEF 25-75 Pre: 2.31 L/sec
FEF2575-%Change-Post: 3 %
FEF2575-%Pred-Post: 70 %
FEF2575-%Pred-Pre: 67 %
FEV1-%Change-Post: 1 %
FEV1-%PRED-PRE: 52 %
FEV1-%Pred-Post: 53 %
FEV1-PRE: 1.83 L
FEV1-Post: 1.85 L
FEV1FVC-%Change-Post: 0 %
FEV1FVC-%Pred-Pre: 107 %
FEV6-%CHANGE-POST: 0 %
FEV6-%Pred-Post: 50 %
FEV6-%Pred-Pre: 50 %
FEV6-Post: 2.14 L
FEV6-Pre: 2.13 L
FEV6FVC-%Pred-Post: 103 %
FEV6FVC-%Pred-Pre: 103 %
FVC-%Change-Post: 0 %
FVC-%PRED-POST: 49 %
FVC-%Pred-Pre: 49 %
FVC-Post: 2.14 L
FVC-Pre: 2.13 L
PRE FEV1/FVC RATIO: 86 %
Post FEV1/FVC ratio: 87 %
Post FEV6/FVC ratio: 100 %
Pre FEV6/FVC Ratio: 100 %
RV % pred: 58 %
RV: 1.23 L
TLC % PRED: 53 %
TLC: 3.86 L

## 2015-04-06 MED ORDER — ALBUTEROL SULFATE (2.5 MG/3ML) 0.083% IN NEBU
2.5000 mg | INHALATION_SOLUTION | Freq: Once | RESPIRATORY_TRACT | Status: AC
  Administered 2015-04-06: 2.5 mg via RESPIRATORY_TRACT

## 2015-05-08 ENCOUNTER — Ambulatory Visit (HOSPITAL_BASED_OUTPATIENT_CLINIC_OR_DEPARTMENT_OTHER): Payer: BLUE CROSS/BLUE SHIELD | Attending: Family Medicine | Admitting: Radiology

## 2015-05-08 VITALS — Ht 71.0 in | Wt 295.0 lb

## 2015-05-08 DIAGNOSIS — G4733 Obstructive sleep apnea (adult) (pediatric): Secondary | ICD-10-CM | POA: Diagnosis not present

## 2015-05-08 DIAGNOSIS — R0683 Snoring: Secondary | ICD-10-CM | POA: Insufficient documentation

## 2015-05-08 DIAGNOSIS — G4710 Hypersomnia, unspecified: Secondary | ICD-10-CM | POA: Diagnosis present

## 2015-05-13 DIAGNOSIS — G4733 Obstructive sleep apnea (adult) (pediatric): Secondary | ICD-10-CM | POA: Diagnosis not present

## 2015-05-13 NOTE — Sleep Study (Signed)
   NAME: Kyle Burns DATE OF BIRTH:  [birth year] MEDICAL RECORD NUMBER [id number]  LOCATION: [facility]  PHYSICIAN: Apolonio Cutting D  DATE OF STUDY: 05/08/2015  SLEEP STUDY TYPE: Nocturnal Polysomnogram               REFERRING PHYSICIAN: Joette CatchingNyland, Leonard, MD  INDICATION FOR STUDY: Hypersomnia with sleep apnea  EPWORTH SLEEPINESS SCORE:   15/24 HEIGHT: 5\' 11"  (180.3 cm)  WEIGHT: 295 lb (133.811 kg)    Body mass index is 41.16 kg/(m^2).  NECK SIZE: 20 in.  MEDICATIONS: Charted for review  SLEEP ARCHITECTURE: Split study protocol. During the diagnostic phase, total sleep time 136.5 minutes with sleep efficiency 88.6%. Stage I was 8.8%, stage II 75.8%, stage III absent, REM 15.4% of total sleep time. Sleep latency 10 minutes, REM latency 60.5 minutes, awake after sleep onset 3.5 minutes, arousal index 23.7, bedtime medication: None  RESPIRATORY DATA: Apnea hypopnea index (AHI) 29.5 per hour. 67 total events scored including one obstructive apnea, 1 central apnea, 65 hypopneas. All events were associated with supine sleep position. REM AHI 62.9 per hour. CPAP was titrated to 18 CWP, AHI 0.9 per hour. He wore a medium fullface mask.  OXYGEN DATA: Moderately loud snoring before CPAP with oxygen desaturation to a nadir of 60% on room air. With CPAP control, snoring was prevented with mean oxygen saturation 89.6%.  CARDIAC DATA: Sinus rhythm with rare PAC  MOVEMENT/PARASOMNIA: No significant movement disturbance, bathroom 1  IMPRESSION/ RECOMMENDATION:   1) Moderate obstructive sleep apnea/hypopnea syndrome, AHI 29.5 per hour with supine events. REM AHI 62.9 per hour. Moderately loud snoring with oxygen desaturation to a nadir of 60% on room air. 2) Successful CPAP titration to 18 CWP, AHI 0.9 per hour. He wore a medium F&P Simplus fullface mask with heated humidifier. Snoring was prevented and mean oxygen saturation was 89.6%.       Waymon BudgeYOUNG,Copelan Maultsby D Diplomate,  American Board of Sleep Medicine  ELECTRONICALLY SIGNED ON:  05/13/2015, 8:55 AM [facility] PH: [phone]   FX: [phone] ACCREDITED BY THE AMERICAN ACADEMY OF SLEEP MEDICINE

## 2015-05-15 ENCOUNTER — Other Ambulatory Visit: Payer: Self-pay | Admitting: Pulmonary Disease

## 2015-05-15 MED ORDER — AEROCHAMBER MV MISC: Status: DC

## 2015-05-15 NOTE — Addendum Note (Signed)
Addended by: Jaynee EaglesLEMONS, LINDSAY C on: 05/15/2015 02:48 PM   Modules accepted: Orders

## 2015-05-23 ENCOUNTER — Ambulatory Visit (INDEPENDENT_AMBULATORY_CARE_PROVIDER_SITE_OTHER): Payer: BLUE CROSS/BLUE SHIELD | Admitting: Pulmonary Disease

## 2015-05-23 ENCOUNTER — Encounter: Payer: Self-pay | Admitting: Pulmonary Disease

## 2015-05-23 VITALS — BP 104/60 | HR 108 | Ht 71.0 in | Wt 293.0 lb

## 2015-05-23 DIAGNOSIS — G4733 Obstructive sleep apnea (adult) (pediatric): Secondary | ICD-10-CM

## 2015-05-23 DIAGNOSIS — J986 Disorders of diaphragm: Secondary | ICD-10-CM

## 2015-05-23 DIAGNOSIS — J449 Chronic obstructive pulmonary disease, unspecified: Secondary | ICD-10-CM | POA: Diagnosis not present

## 2015-05-23 NOTE — Assessment & Plan Note (Signed)
His PFT from April showed a slight decline and his total lung capacity but if you look at the data there is a clear difference in the technique used by the technicians, so I do not think that this is representative of a true decline in his total lung capacity. Further, his symptoms have not changed. I explained to him today again at length that the most significant thing he can do is lose weight. I do not think that he would be a candidate for diaphragm plication surgery until he loses weight.  I explained to him at length today that I think he needs to get involved with a medical weight loss program, he apparently has a request in for one in [place]. If that one does not work out a [facility].  Plan: Continue efforts at weight loss Follow-up 6 months

## 2015-05-23 NOTE — Progress Notes (Signed)
Subjective:    Patient ID: Kyle Burns, male    DOB: [birth year], 49 y.o.   MRN: [id number]  Synopsis: This is a very pleasant lifelong nonsmoker who first saw [facility] in 2015 for evaluation of shortness of breath. He had previously been seen by cardiology and had a normal stress test in 2015. He also has a history of a normal echocardiogram in 2013. He was told as a child that he has asthma. A chest x-ray in 2015 demonstrated a left hemidiaphragm elevation. Followup fluoroscopy of the diaphragm showed no movement of the left diaphragm. He has no history of trauma. He was diagnosed with obstructive sleep apnea in June 2016. Polysomnogram showed an AHI of 29. CPAP was titrated to 18 cm of water.  HPI  Chief Complaint  Patient presents with  . Follow-up    Pt c/o stable sob with exertion, as well as rare bouts of chest/back tightness with exertion.  Denies cough, congestion.      Mr. Schiller is still struggling with his weight.  He is supposed to see a local physician for a dietary program for weight loss. He has been having a lot of low back pain with exercise.  He has not had worsening of his dyspnea, but he still gets short of breath with heavy exertion like running up a flight of stairs.  He had an episode of shortness of breath and chest pain.  He says this happens when he is "getting excited".  He also notes that he recently had to sprint at work and this made him quite short of breath with chest tightness. He continues to have daytime fatigue. He recently had a polysomnogram which was positive. He was treated with CPAP during that time. He said that the CPAP greatly improved his breathing in the next day he felt "great".  Past Medical History  Diagnosis Date  . HTN (hypertension)   . GERD (gastroesophageal reflux disease)   . Back pain      Review of Systems  Constitutional: Negative for fever, chills and fatigue.  HENT: Negative for rhinorrhea, sinus pressure and  sneezing.   Respiratory: Positive for shortness of breath. Negative for cough and wheezing.   Cardiovascular: Negative for chest pain, palpitations and leg swelling.       Objective:   Physical Exam Filed Vitals:   05/23/15 1508  BP: 104/60  Pulse: 108  SpO2: 92%   RA  Gen: obese but well appearing, no acute distress HEENT: NCAT,  EOMi, OP clear, thick neck PULM: Diminished left base otherwise clear CV: RRR, no mgr, no JVD AB: BS+, soft, nontender Ext: warm, no edema, no clubbing, no cyanosis Derm: no rash or skin breakdown Neuro: A&Ox4, MAEW  Pulmonary function testing from April reviewed showing a decline in total lung capacity but there is a clear difference in technique compared to the one from October 2015, FVC was relatively stable, there is no airflow obstruction Polysomnogram results reviewed as detailed above     Assessment & Plan:   Left hemidiaphragm paralysis His PFT from April showed a slight decline and his total lung capacity but if you look at the data there is a clear difference in the technique used by the technicians, so I do not think that this is representative of a true decline in his total lung capacity. Further, his symptoms have not changed. I explained to him today again at length that the most significant thing he can do is lose weight.  I do not think that he would be a candidate for diaphragm plication surgery until he loses weight.  I explained to him at length today that I think he needs to get involved with a medical weight loss program, he apparently has a request in for one in [place]. If that one does not work out a [facility] him [facility].  Plan: Continue efforts at weight loss Follow-up 6 months  Chronic obstructive airway disease with asthma Stable interval, continue Symbicort.  Obstructive sleep apnea He has a new diagnosis of sleep apnea which is in keeping with his obesity and daytime fatigue. His polysomnogram  showed an AHI of 29 and CPAP was titrated to 18 cm of water. Even with this he had some mild hypoxemia.  Plan: Start nightly CPAP Check overnight oximetry on CPAP Follow-up 6 months or sooner   Updated Medication List Outpatient Encounter Prescriptions as of 05/23/2015  Medication Sig  . aspirin 81 MG tablet Take 81 mg by mouth daily.  Marland Kitchen. atorvastatin (LIPITOR) 10 MG tablet Take 10 mg by mouth daily.  . budesonide-formoterol (SYMBICORT) 80-4.5 MCG/ACT inhaler Inhale 2 puffs into the lungs 2 (two) times daily.  Marland Kitchen. esomeprazole (NEXIUM) 40 MG capsule Take 40 mg by mouth daily before breakfast.  . furosemide (LASIX) 20 MG tablet Take 20 mg by mouth daily.   Marland Kitchen. losartan-hydrochlorothiazide (HYZAAR) 100-25 MG per tablet Take 1 tablet by mouth daily.  . meloxicam (MOBIC) 15 MG tablet Take 7.5 mg by mouth 2 (two) times daily.   Marland Kitchen. Spacer/Aero-Holding Chambers (AEROCHAMBER MV) inhaler Use as instructed  . spironolactone (ALDACTONE) 25 MG tablet Take 25 mg by mouth daily.  . [DISCONTINUED] benzonatate (TESSALON) 200 MG capsule Take 1 capsule (200 mg total) by mouth 3 (three) times daily as needed for cough. (Patient not taking: Reported on 05/23/2015)  . [DISCONTINUED] HYDROcodone-homatropine (HYCODAN) 5-1.5 MG/5ML syrup Take 5 mLs by mouth every 6 (six) hours as needed for cough. (Patient not taking: Reported on 05/23/2015)   No facility-administered encounter medications on file as of 05/23/2015.

## 2015-05-23 NOTE — Addendum Note (Signed)
Addended by: Velvet BatheAULFIELD, Rosalin Buster L on: 05/23/2015 03:44 PM   Modules accepted: Orders

## 2015-05-23 NOTE — Assessment & Plan Note (Signed)
He has a new diagnosis of sleep apnea which is in keeping with his obesity and daytime fatigue. His polysomnogram showed an AHI of 29 and CPAP was titrated to 18 cm of water. Even with this he had some mild hypoxemia.  Plan: Start nightly CPAP Check overnight oximetry on CPAP Follow-up 6 months or sooner

## 2015-05-23 NOTE — Assessment & Plan Note (Signed)
Stable interval, continue Symbicort.

## 2015-05-23 NOTE — Patient Instructions (Signed)
We will prescribe CPAP and check an oxygen level in about 2 weeks Exercise regularly and lose weight  [facility]  [address]  [phone]   Keep using your symbicort We will see you back in 6 months

## 2015-07-04 ENCOUNTER — Telehealth: Payer: Self-pay

## 2015-07-04 DIAGNOSIS — R0902 Hypoxemia: Secondary | ICD-10-CM

## 2015-07-04 NOTE — Telephone Encounter (Signed)
-----   Message from Lupita Leashouglas B McQuaid, MD sent at 07/03/2015  6:51 PM EDT ----- A, His ONO on CPAP showed that he needs 2L O2 through the CPAP machine. Please arrange. Thanks B

## 2015-07-04 NOTE — Telephone Encounter (Signed)
Spoke with pt, he is aware of ONO results/recs.  Order placed for 2lpm bleed in to cpap qhs.  Nothing further needed.

## 2015-08-06 ENCOUNTER — Encounter: Payer: Self-pay | Admitting: Pulmonary Disease

## 2015-08-21 ENCOUNTER — Ambulatory Visit: Payer: BLUE CROSS/BLUE SHIELD | Admitting: Pulmonary Disease

## 2015-10-11 ENCOUNTER — Encounter: Payer: Self-pay | Admitting: Pulmonary Disease

## 2015-10-11 ENCOUNTER — Other Ambulatory Visit: Payer: Self-pay | Admitting: Pulmonary Disease

## 2015-10-11 MED ORDER — BUDESONIDE-FORMOTEROL FUMARATE 80-4.5 MCG/ACT IN AERO: 2.0000 | INHALATION_SPRAY | Freq: Two times a day (BID) | RESPIRATORY_TRACT | Status: DC

## 2015-10-11 NOTE — Telephone Encounter (Signed)
Pt did not need a refill on the spacer. He needed Symbicort. This has been sent in. Nothing further was needed.

## 2015-11-29 ENCOUNTER — Ambulatory Visit (INDEPENDENT_AMBULATORY_CARE_PROVIDER_SITE_OTHER): Payer: BLUE CROSS/BLUE SHIELD | Admitting: Pulmonary Disease

## 2015-11-29 ENCOUNTER — Encounter: Payer: Self-pay | Admitting: Pulmonary Disease

## 2015-11-29 VITALS — BP 128/76 | HR 91 | Ht 71.0 in | Wt 293.0 lb

## 2015-11-29 DIAGNOSIS — J0100 Acute maxillary sinusitis, unspecified: Secondary | ICD-10-CM | POA: Diagnosis not present

## 2015-11-29 DIAGNOSIS — J019 Acute sinusitis, unspecified: Secondary | ICD-10-CM | POA: Insufficient documentation

## 2015-11-29 DIAGNOSIS — J986 Disorders of diaphragm: Secondary | ICD-10-CM | POA: Diagnosis not present

## 2015-11-29 DIAGNOSIS — G4733 Obstructive sleep apnea (adult) (pediatric): Secondary | ICD-10-CM

## 2015-11-29 MED ORDER — AZITHROMYCIN 500 MG PO TABS
ORAL_TABLET | ORAL | Status: DC
Start: 2015-11-29 — End: 2016-05-27

## 2015-11-29 NOTE — Assessment & Plan Note (Signed)
Continue using CPAP qHS. I have advised that he use saline gel at night for sinus congestion.

## 2015-11-29 NOTE — Assessment & Plan Note (Signed)
He has continued to have symptoms of congestion, facial pressure, and green mucus production despite over 10 days of conservative management. I believe he has an acute bacterial sinus infection. Plan: Z-Pak Saline rinses

## 2015-11-29 NOTE — Assessment & Plan Note (Signed)
This problem has not progressed but is complicated by his obesity. Today we spent an extensive amount of time talking about weight loss options. He is currently trying to diet and exercise to some degree. However, I question how seriously he stayed in this. I advised him that he may want to consider seeing a bariatric surgeon, that he said he is not interested in that right now.  Plan: We'll plan on one more follow-up visit in 6 months, has not made much progress with weight loss then I may strongly encourage him to see a bariatric surgeon At this point I don't see any further indication for lung function testing

## 2015-11-29 NOTE — Patient Instructions (Signed)
Take the Zpack as prescribed Use Lloyd HugerNeil Med saline rinses for the sinus congestion Use saline gel for your sinuses to help reduce congestion with the CPAP Keep working on weight loss We will see you back in 6 months or sooner if needed

## 2015-11-29 NOTE — Progress Notes (Signed)
Subjective:    Patient ID: Kyle Burns, male    DOB: [birth year], 49 y.o.   MRN: [id number]  Synopsis: This is a very pleasant lifelong nonsmoker who first saw [facility] in 2015 for evaluation of shortness of breath. He had previously been seen by cardiology and had a normal stress test in 2015. He also has a history of a normal echocardiogram in 2013. He was told as a child that he has asthma. A chest x-ray in 2015 demonstrated a left hemidiaphragm elevation. Followup fluoroscopy of the diaphragm showed no movement of the left diaphragm. He has no history of trauma. He was diagnosed with obstructive sleep apnea in June 2016. Polysomnogram showed an AHI of 29. CPAP was titrated to 18 cm of water.  HPI  Chief Complaint  Patient presents with  . Follow-up    pt c/o sinus congestion, PND, runny nose, prod cough with green/yellow mucus X1 week.     [name] has been doing OK except he had a bad cold for the last few weeks.  He saw Dr. Lysbeth GalasNyland and he was told to continue using his Flonase.  However after than he got even worse. He has been having a lot of sinus congestion, green mucus production, and trouble breathing from his sinus congestion.  He has bene coughing u breen mucus.  No wheezgin.  He has noticed a little blood in the mucus from time to time.  He is still coughing up some mucus.  He went Monday night to a public event and distracted others due to the coughing.  He denies fever or chills.  No wheezing or true shortness of breaht aside for the sinus congestion.   He continues to use the CPAP machine, but with the cold lately he has not been doing well.    He has been trying to lose weight.  He is not walking regularly.    Past Medical History  Diagnosis Date  . HTN (hypertension)   . GERD (gastroesophageal reflux disease)   . Back pain      Review of Systems  Constitutional: Negative for fever, chills and fatigue.  HENT: Negative for rhinorrhea, sinus pressure and  sneezing.   Respiratory: Positive for shortness of breath. Negative for cough and wheezing.   Cardiovascular: Negative for chest pain, palpitations and leg swelling.       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 Today we spent an extensive amount of time talking about weight loss options. He is currently trying to diet and exercise to some degree. However, I question how seriously he stayed in this. I advised him that he may want to consider seeing a bariatric surgeon, that he said he is not interested in that right now.  Plan: We'll plan on one more follow-up visit in 6 months, has not made much progress with weight loss then I may strongly encourage him  to see a bariatric surgeon At this point I don't see any further indication for lung function testing  Obstructive sleep apnea Continue using CPAP qHS. I have advised that he use saline gel at night for sinus congestion.  Acute sinus infection He has continued to have symptoms of congestion, facial pressure, and green mucus production despite over 10 days of conservative management. I believe he has an acute bacterial sinus infection. Plan: Z-Pak Saline rinses   Updated Medication List Outpatient Encounter Prescriptions as of 11/29/2015  Medication Sig  . aspirin 81 MG tablet Take 81 mg by mouth daily.  Marland Kitchen atorvastatin (LIPITOR) 10 MG tablet Take 10 mg by mouth daily.  . budesonide-formoterol (SYMBICORT)  80-4.5 MCG/ACT inhaler Inhale 2 puffs into the lungs 2 (two) times daily.  Marland Kitchen esomeprazole (NEXIUM) 40 MG capsule Take 40 mg by mouth daily before breakfast.  . furosemide (LASIX) 20 MG tablet Take 20 mg by mouth daily.   Marland Kitchen losartan-hydrochlorothiazide (HYZAAR) 100-25 MG per tablet Take 1 tablet by mouth daily.  . meloxicam (MOBIC) 15 MG tablet Take 7.5 mg by mouth 2 (two) times daily.   Marland Kitchen Spacer/Aero-Holding Chambers (AEROCHAMBER MV) inhaler Use as instructed  . spironolactone (ALDACTONE) 25 MG tablet Take 25 mg by mouth daily.  Marland Kitchen azithromycin (ZITHROMAX) 500 MG tablet  daily for 1 day, then  daily for 4 days   No facility-administered encounter medications on file as of 11/29/2015.

## 2016-04-23 ENCOUNTER — Encounter: Payer: Self-pay | Admitting: Pulmonary Disease

## 2016-04-24 MED ORDER — BUDESONIDE-FORMOTEROL FUMARATE 80-4.5 MCG/ACT IN AERO: 2.0000 | INHALATION_SPRAY | Freq: Two times a day (BID) | RESPIRATORY_TRACT | Status: DC

## 2016-05-27 ENCOUNTER — Ambulatory Visit (INDEPENDENT_AMBULATORY_CARE_PROVIDER_SITE_OTHER): Payer: 59 | Admitting: Pulmonary Disease

## 2016-05-27 ENCOUNTER — Encounter: Payer: Self-pay | Admitting: Pulmonary Disease

## 2016-05-27 VITALS — BP 132/84 | HR 105 | Ht 71.0 in | Wt 286.0 lb

## 2016-05-27 DIAGNOSIS — G4733 Obstructive sleep apnea (adult) (pediatric): Secondary | ICD-10-CM

## 2016-05-27 DIAGNOSIS — J45909 Unspecified asthma, uncomplicated: Secondary | ICD-10-CM

## 2016-05-27 DIAGNOSIS — J449 Chronic obstructive pulmonary disease, unspecified: Secondary | ICD-10-CM | POA: Diagnosis not present

## 2016-05-27 NOTE — Patient Instructions (Signed)
Keep taking Symbicort as you're doing Keep using her CPAP at night, we will request a download Keep making exercise a priority and make an effort to lose weight I will see you back in January

## 2016-05-27 NOTE — Progress Notes (Signed)
Subjective:    Patient ID: Kyle Burns, male    DOB: 10-Feb-1966, 50 y.o.   MRN: [id number]  Synopsis: This is a very pleasant lifelong nonsmoker who first saw [facility] in 2015 for evaluation of shortness of breath. He had previously been seen by cardiology and had a normal stress test in 2015. He also has a history of a normal echocardiogram in 2013. He was told as a child that he has asthma. A chest x-ray in 2015 demonstrated a left hemidiaphragm elevation. Followup fluoroscopy of the diaphragm showed no movement of the left diaphragm. He has no history of trauma. He was diagnosed with obstructive sleep apnea in June 2016. Polysomnogram showed an AHI of 29. CPAP was titrated to 18 cm of water.  HPI  Chief Complaint  Patient presents with  . Follow-up    pt states he's been doing well-notes SOB with exertion.     Kyle Burns has been walking more lately and watching what he eats.  Breathing is fine at rest.  But if he runs he has some dyspnea. He has been trying to exercise more.  No asthma attacks still using Symbicort. He is still using CPAP, sometimes feels like it isn't strong enough.  No longer sleepy during the daytime.   Past Medical History  Diagnosis Date  . HTN (hypertension)   . GERD (gastroesophageal reflux disease)   . Back pain      Review of Systems  Constitutional: Negative for fever, chills and fatigue.  HENT: Negative for rhinorrhea, sinus pressure and sneezing.   Respiratory: Positive for shortness of breath. Negative for cough and wheezing.   Cardiovascular: Negative for chest pain, palpitations and leg swelling.       Objective:   Physical Exam Filed Vitals:   05/27/16 1344  BP: 132/84  Pulse: 105  Height:  (1.803 m)  Weight: 286 lb (129.729 kg)  SpO2: 95%   RA  Gen: obese but well appearing, no acute distress HEENT: NCAT,  EOMi, OP clear, thick neck PULM: Diminished left base otherwise clear CV: RRR, no mgr, no JVD AB: BS+,  soft, nontender Ext: warm, no edema, no clubbing, no cyanosis Derm: no rash or skin breakdown Neuro: A&Ox4, MAEW       Assessment & Plan:   Obstructive sleep apnea From a symptom standpoint is doing really well and he reports compliance. However, he's noted that at sometimes the pressure from his machine seems to be variable and insufficient.  Plan: Request a download from his CPAP machine  Chronic obstructive airway disease with asthma Stable interval.  Plan: Continue Symbicort for now, consider stepdown therapy next visit  Morbid obesity Counseled at length to lose weight again today. We discussed the importance of calorie counting and making exercise a priority.   Updated Medication List Outpatient Encounter Prescriptions as of 05/27/2016  Medication Sig  . aspirin 81 MG tablet Take 81 mg by mouth daily.  Marland Kitchen atorvastatin (LIPITOR) 10 MG tablet Take 10 mg by mouth daily.  . budesonide-formoterol (SYMBICORT) 80-4.5 MCG/ACT inhaler Inhale 2 puffs into the lungs 2 (two) times daily.  Marland Kitchen esomeprazole (NEXIUM) 40 MG capsule Take 40 mg by mouth daily before breakfast.  . furosemide (LASIX) 20 MG tablet Take 20 mg by mouth daily.   Marland Kitchen losartan-hydrochlorothiazide (HYZAAR) 100-25 MG per tablet Take 1 tablet by mouth daily.  . meloxicam (MOBIC) 15 MG tablet Take 7.5 mg by mouth 2 (two) times daily.   Marland Kitchen Spacer/Aero-Holding Chambers (AEROCHAMBER  MV) inhaler Use as instructed  . spironolactone (ALDACTONE) 25 MG tablet Take 25 mg by mouth daily.  . [DISCONTINUED] azithromycin (ZITHROMAX) 500 MG tablet 500mg  daily for 1 day, then 250mg  daily for 4 days (Patient not taking: Reported on 05/27/2016)   No facility-administered encounter medications on file as of 05/27/2016.

## 2016-05-27 NOTE — Assessment & Plan Note (Signed)
Stable interval.  Plan: Continue Symbicort for now, consider stepdown therapy next visit

## 2016-05-27 NOTE — Assessment & Plan Note (Signed)
From a symptom standpoint is doing really well and he reports compliance. However, he's noted that at sometimes the pressure from his machine seems to be variable and insufficient.  Plan: Request a download from his CPAP machine

## 2016-05-27 NOTE — Assessment & Plan Note (Signed)
Counseled at length to lose weight again today. We discussed the importance of calorie counting and making exercise a priority.

## 2016-07-07 ENCOUNTER — Encounter: Payer: Self-pay | Admitting: Pulmonary Disease

## 2016-07-21 DIAGNOSIS — E669 Obesity, unspecified: Secondary | ICD-10-CM | POA: Insufficient documentation

## 2016-07-21 DIAGNOSIS — K219 Gastro-esophageal reflux disease without esophagitis: Secondary | ICD-10-CM | POA: Insufficient documentation

## 2016-10-18 ENCOUNTER — Emergency Department (HOSPITAL_COMMUNITY)
Admission: EM | Admit: 2016-10-18 | Discharge: 2016-10-18 | Disposition: A | Discharge status: Home / Self Care | Payer: Worker's Compensation | Attending: Emergency Medicine | Admitting: Emergency Medicine

## 2016-10-18 ENCOUNTER — Emergency Department (HOSPITAL_COMMUNITY): Payer: Worker's Compensation

## 2016-10-18 ENCOUNTER — Encounter (HOSPITAL_COMMUNITY): Payer: Self-pay | Admitting: Emergency Medicine

## 2016-10-18 DIAGNOSIS — J441 Chronic obstructive pulmonary disease with (acute) exacerbation: Secondary | ICD-10-CM | POA: Diagnosis not present

## 2016-10-18 DIAGNOSIS — I10 Essential (primary) hypertension: Secondary | ICD-10-CM | POA: Diagnosis not present

## 2016-10-18 DIAGNOSIS — M5440 Lumbago with sciatica, unspecified side: Secondary | ICD-10-CM | POA: Insufficient documentation

## 2016-10-18 DIAGNOSIS — Z7982 Long term (current) use of aspirin: Secondary | ICD-10-CM | POA: Insufficient documentation

## 2016-10-18 DIAGNOSIS — F17220 Nicotine dependence, chewing tobacco, uncomplicated: Secondary | ICD-10-CM | POA: Diagnosis not present

## 2016-10-18 DIAGNOSIS — Y929 Unspecified place or not applicable: Secondary | ICD-10-CM | POA: Insufficient documentation

## 2016-10-18 DIAGNOSIS — Y9389 Activity, other specified: Secondary | ICD-10-CM | POA: Insufficient documentation

## 2016-10-18 DIAGNOSIS — Z79899 Other long term (current) drug therapy: Secondary | ICD-10-CM | POA: Insufficient documentation

## 2016-10-18 DIAGNOSIS — M545 Low back pain: Secondary | ICD-10-CM | POA: Diagnosis present

## 2016-10-18 DIAGNOSIS — X500XXA Overexertion from strenuous movement or load, initial encounter: Secondary | ICD-10-CM | POA: Diagnosis not present

## 2016-10-18 DIAGNOSIS — Y990 Civilian activity done for income or pay: Secondary | ICD-10-CM | POA: Insufficient documentation

## 2016-10-18 LAB — URINALYSIS, ROUTINE W REFLEX MICROSCOPIC
Bilirubin Urine: NEGATIVE
Glucose, UA: NEGATIVE mg/dL
Hgb urine dipstick: NEGATIVE
Ketones, ur: NEGATIVE mg/dL
Leukocytes, UA: NEGATIVE
Nitrite: NEGATIVE
Protein, ur: NEGATIVE mg/dL
SPECIFIC GRAVITY, URINE: 1.02 (ref 1.005–1.030)
pH: 6 (ref 5.0–8.0)

## 2016-10-18 MED ORDER — HYDROMORPHONE HCL 2 MG/ML IJ SOLN
2.0000 mg | Freq: Once | INTRAMUSCULAR | Status: AC
  Administered 2016-10-18: 2 mg via INTRAMUSCULAR
  Filled 2016-10-18: qty 1

## 2016-10-18 MED ORDER — KETOROLAC TROMETHAMINE 60 MG/2ML IM SOLN
60.0000 mg | Freq: Once | INTRAMUSCULAR | Status: AC
  Administered 2016-10-18: 60 mg via INTRAMUSCULAR
  Filled 2016-10-18: qty 2

## 2016-10-18 MED ORDER — METHYLPREDNISOLONE SODIUM SUCC 125 MG IJ SOLR
125.0000 mg | Freq: Once | INTRAMUSCULAR | Status: AC
  Administered 2016-10-18: 125 mg via INTRAMUSCULAR
  Filled 2016-10-18: qty 2

## 2016-10-18 MED ORDER — HYDROCODONE-ACETAMINOPHEN 5-325 MG PO TABS: 2.0000 | ORAL_TABLET | ORAL | 0 refills | Status: DC | PRN

## 2016-10-18 NOTE — ED Triage Notes (Signed)
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 Started having some discomfort in his back. Pt states on Thursday he began having much worse pain and radiation of symptoms into his R leg. Pt reports numbness in his R lower leg. Pt ambulatory to Triage.

## 2016-10-18 NOTE — ED Notes (Signed)
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

## 2016-10-18 NOTE — ED Notes (Signed)
Pt was seen by PA at PCP office yesterday, prescribed Robaxin and PO steroids.

## 2016-10-18 NOTE — Discharge Instructions (Signed)
See your Physician for recheck next week  °

## 2016-10-19 NOTE — ED Provider Notes (Signed)
[facility] Provider Note   CSN: [id number] Arrival date & time: 10/18/16  1132     [id number]   Chief Complaint Chief Complaint  Patient presents with  . Back Pain    HPI Kyle Burns is a 50 y.o. male.  The history is provided by the patient. No language interpreter was used.  Back Pain   This is a new problem. The current episode started more than 2 days ago. The problem occurs constantly. The problem has been gradually worsening. The pain is associated with lifting heavy objects and twisting. The pain is present in the lumbar spine. The quality of the pain is described as stabbing. The pain does not radiate. The symptoms are aggravated by bending and twisting. The pain is the same all the time. Pertinent negatives include no chest pain. He has tried muscle relaxants for the symptoms. The treatment provided no relief.  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  Pt has had back problems years ago but no recent problems. No relief with robaxin.  Pt has pain that wraps around to front of leg  Past Medical History:  Diagnosis Date  . Back pain   . GERD (gastroesophageal reflux disease)   . HTN (hypertension)     Patient Active Problem List   Diagnosis Date Noted  . Acute sinus infection 11/29/2015  . Obstructive sleep apnea 05/23/2015  . Chronic obstructive airway disease with asthma (HCC) 06/14/2014  . Left hemidiaphragm paralysis 02/02/2013  . Morbid obesity (HCC) 02/02/2013  . Essential hypertension 02/02/2013  . Hyperlipidemia LDL goal < 100 02/02/2013  . Right bundle branch block 02/02/2013    Past Surgical History:  Procedure Laterality Date  . bilateral hernia repair         Home Medications    Prior to Admission medications   Medication Sig Start Date End Date Taking? Authorizing Provider  aspirin 81 MG EC tablet Take 81 mg by mouth daily.   Yes Historical Provider, MD  atorvastatin (LIPITOR) 10 MG tablet Take 10 mg by mouth daily.    Yes Historical Provider, MD  budesonide-formoterol (SYMBICORT) 80-4.5 MCG/ACT inhaler Inhale 2 puffs into the lungs 2 (two) times daily. 04/24/16  Yes Lupita Leashouglas B McQuaid, MD  esomeprazole (NEXIUM) 40 MG capsule Take 40 mg by mouth daily before breakfast.   Yes Historical Provider, MD  furosemide (LASIX) 20 MG tablet Take 20 mg by mouth daily.    Yes Historical Provider, MD  losartan-hydrochlorothiazide (HYZAAR) 100-25 MG per tablet Take 1 tablet by mouth daily.   Yes Historical Provider, MD  meloxicam (MOBIC) 15 MG tablet Take 15 mg by mouth 2 (two) times daily.    Yes Historical Provider, MD  methocarbamol (ROBAXIN) 500 MG tablet 1 tablet 3 (three) times daily. 10/17/16  Yes Historical Provider, MD  predniSONE (DELTASONE) 10 MG tablet 1 tablet as directed. 10/17/16 10/23/16 Yes Historical Provider, MD  Spacer/Aero-Holding Chambers (AEROCHAMBER MV) inhaler Use as instructed 05/15/15  Yes Lupita Leashouglas B McQuaid, MD  spironolactone (ALDACTONE) 25 MG tablet Take 25 mg by mouth daily.   Yes Historical Provider, MD  HYDROcodone-acetaminophen (NORCO/VICODIN) 5-325 MG tablet Take 2 tablets by mouth every 4 (four) hours as needed. 10/18/16   Elson AreasLeslie K Ahniya Mitchum, PA-C    Family History Family History  Problem Relation Age of Onset  . Asthma Father   . Cancer Father     stomach  . Asthma Maternal Grandfather   . Asthma Paternal Grandfather  Social History Social History  Substance Use Topics  . Smoking status: Never Smoker  . Smokeless tobacco: Current User    Types: Chew     Comment: Uses chew "when he has a bad day"  . Alcohol use No     Comment: Socially     Allergies   Patient has no known allergies.   Review of Systems Review of Systems  Cardiovascular: Negative for chest pain.  Musculoskeletal: Positive for back pain.  All other systems reviewed and are negative.    Physical Exam Updated Vital Signs BP 104/60   Pulse 86   Temp 98.2 F (36.8 C) (Oral)   Resp 18   Ht 5\' 11"  (1.803  m)   Wt 127.5 kg   SpO2 98%   BMI 39.19 kg/m   Physical Exam  Constitutional: He appears well-developed and well-nourished.  HENT:  Head: Normocephalic and atraumatic.  Eyes: Conjunctivae are normal.  Neck: Neck supple.  Cardiovascular: Normal rate and regular rhythm.   No murmur heard. Pulmonary/Chest: Effort normal and breath sounds normal. No respiratory distress.  Abdominal: Soft. There is no tenderness.  Musculoskeletal: He exhibits no edema.  Tender right low back,  Pain with movement  nv and ns intact  Neurological: He is alert.  Skin: Skin is warm and dry.  Psychiatric: He has a normal mood and affect.  Nursing note and vitals reviewed.    ED Treatments / Results  Labs (all labs ordered are listed, but only abnormal results are displayed) Labs Reviewed  URINALYSIS, ROUTINE W REFLEX MICROSCOPIC (NOT AT [facility])    EKG  EKG Interpretation None       Radiology Dg Lumbar Spine Complete  Result Date: 10/18/2016 CLINICAL DATA:  Low back pain radiating into the right leg, initial encounter EXAM: LUMBAR SPINE - COMPLETE 4+ VIEW COMPARISON:  None. FINDINGS: Five lumbar type vertebral bodies are well visualized. Vertebral body height is well maintained. No pars defects are seen. No anterolisthesis is noted. No significant facet hypertrophic changes are noted. Mild disc space narrowing is noted at L4-5. No soft tissue abnormality is seen. IMPRESSION: No acute abnormality noted.  Mild degenerative change is seen. Electronically Signed   By: Alcide CleverMark  Lukens M.D.   On: 10/18/2016 12:39    Procedures Procedures (including critical care time)  Medications Ordered in ED Medications  methylPREDNISolone sodium succinate (SOLU-MEDROL) 125 mg/2 mL injection 125 mg (125 mg Intramuscular Given 10/18/16 1301)  ketorolac (TORADOL) injection 60 mg (60 mg Intramuscular Given 10/18/16 1301)  HYDROmorphone (DILAUDID) injection 2 mg (2 mg Intramuscular Given 10/18/16 1301)     Initial  Impression / Assessment and Plan / ED Course  I have reviewed the triage vital signs and the nursing notes.  Pertinent labs & imaging results that were available during my care of the patient were reviewed by me and considered in my medical decision making (see chart for details).  Clinical Course     I am suspicious of disc disease.   Pt given torodol, dilaudid and solumedrol.   Pt counseled on possible herniated disc vs sciatica.  Pt see Dr. Noel Geroldohen Back Surgery.  I advised pt to call on Monday and schedule to be seen.    Final Clinical Impressions(s) / ED Diagnoses   Final diagnoses:  Acute right-sided low back pain with sciatica, sciatica laterality unspecified    New Prescriptions Discharge Medication List as of 10/18/2016  1:46 PM    START taking these medications   Details  HYDROcodone-acetaminophen (  NORCO/VICODIN) 5-325 MG tablet Take 2 tablets by mouth every 4 (four) hours as needed., Starting Sat 10/18/2016, Print      An After Visit Summary was printed and given to the patient.   Lonia Skinner Palacios, PA-C 10/19/16 1610    Doug Sou, MD 10/19/16 [phone]

## 2018-02-26 LAB — HM HEPATITIS C SCREENING LAB: HM Hepatitis Screen: NEGATIVE

## 2018-04-21 ENCOUNTER — Other Ambulatory Visit (INDEPENDENT_AMBULATORY_CARE_PROVIDER_SITE_OTHER): Payer: 59

## 2018-04-21 ENCOUNTER — Encounter: Payer: Self-pay | Admitting: Pulmonary Disease

## 2018-04-21 ENCOUNTER — Ambulatory Visit: Payer: 59 | Admitting: Pulmonary Disease

## 2018-04-21 ENCOUNTER — Ambulatory Visit (INDEPENDENT_AMBULATORY_CARE_PROVIDER_SITE_OTHER)
Admission: RE | Admit: 2018-04-21 | Discharge: 2018-04-21 | Disposition: A | Discharge status: Home / Self Care | Payer: 59 | Source: Ambulatory Visit | Attending: Pulmonary Disease | Admitting: Pulmonary Disease

## 2018-04-21 VITALS — BP 122/84 | HR 100 | Ht 71.0 in | Wt 288.2 lb

## 2018-04-21 DIAGNOSIS — J449 Chronic obstructive pulmonary disease, unspecified: Secondary | ICD-10-CM

## 2018-04-21 DIAGNOSIS — R0600 Dyspnea, unspecified: Secondary | ICD-10-CM

## 2018-04-21 DIAGNOSIS — J986 Disorders of diaphragm: Secondary | ICD-10-CM | POA: Diagnosis not present

## 2018-04-21 DIAGNOSIS — G4733 Obstructive sleep apnea (adult) (pediatric): Secondary | ICD-10-CM

## 2018-04-21 LAB — BRAIN NATRIURETIC PEPTIDE: Pro B Natriuretic peptide (BNP): 13 pg/mL (ref 0.0–100.0)

## 2018-04-21 NOTE — Progress Notes (Signed)
Subjective:    Patient ID: Kyle Burns, male    DOB: 07-13-1966, 52 y.o.   MRN: [id number]  Synopsis: This is a very pleasant lifelong nonsmoker who first saw [facility] in 2015 for evaluation of shortness of breath. He had previously been seen by cardiology and had a normal stress test in 2015. He also has a history of a normal echocardiogram in 2013. He was told as a child that he has asthma. A chest x-ray in 2015 demonstrated a left hemidiaphragm elevation. Followup fluoroscopy of the diaphragm showed no movement of the left diaphragm. He has no history of trauma. He was diagnosed with obstructive sleep apnea in June 2016. Polysomnogram showed an AHI of 29. CPAP was titrated to 18 cm of water.  HPI  Chief Complaint  Patient presents with  . Follow-up    ROV, PCP suggested pt come back related to CPAP and SOB    HE continues to struggle with dyspnea when he exerts himself.  He hasn't ben exercising too much due to leg cramps.    He has had a couple of episodes of colds that required antibiotics.  He is still taking symbicort 2 puffs twice a day.    He has been having ankle swelling for at least a few months.  He wondered if this was related to his boots being too tight.   OSA: he stopped using CPAP for a while, now he says that he feels like the pressure isn't enough for him when he wears it.  He says taht sometimes he feels like he isn't really getting enough pressure.    He has actually been using lasix some lately that makes him urinate a lot.   Past Medical History:  Diagnosis Date  . Back pain   . GERD (gastroesophageal reflux disease)   . HTN (hypertension)      Review of Systems  Constitutional: Negative for chills, fatigue and fever.  HENT: Negative for rhinorrhea, sinus pressure and sneezing.   Respiratory: Positive for shortness of breath. Negative for cough and wheezing.   Cardiovascular: Negative for chest pain, palpitations and leg swelling.         Objective:   Physical Exam Vitals:   04/21/18 1521  BP: 122/84  Pulse: 100  SpO2: 100%  Weight: 288 lb 3.2 oz (130.7 kg)  Height:  (1.803 m)   RA  Gen: obese but well appearing HENT: OP clear, TM's clear, neck supple PULM: Diminished l base B, normal percussion CV: RRR, no mgr, trace edema GI: BS+, soft, nontender Derm: no cyanosis or rash Psyche: normal mood and affect   No history of trauma 06/2013 sniff test> left hemidiaphragm paralysis 09/2014 PFT> no obstruction, TLC 4.85L (67% pred), DLCO 65% pred 09/2014 CT chest > no mediastinal mass or lymphadenopathy, 3mm RUL nodule April 2016 PFT> ratio normal, FEV1 1.85 L (53% predicted, no change of bronchial dilator), FVC 2.14 L (49% predicted), total lung capacity 3.86 L) 53% predicted), DLCO 19.79 (58% Pred)      Assessment & Plan:    Dyspnea, unspecified type - Plan: Pulmonary function test, ECHOCARDIOGRAM COMPLETE, Cpap titration, DG Chest 2 View, B Nat Peptide, Ambulatory referral to Physical Therapy  Obstructive sleep apnea  Chronic obstructive airway disease with asthma (HCC)  Diaphragm paralysis  Discussion: Kyle Burns returns to clinic today complaining of shortness of breath on exertion.  He has some leg swelling now which makes me worried about pulmonary hypertension because he has not been  compliant with CPAP therapy.  I believe that his deconditioning and obesity other primary causes of his shortness of breath but certainly his diaphragm paralysis uppercase matters.  It does not sound as if his asthma has been worse recently.  We need to work-up to make sure there is no new or worsening lung or heart problem, but I think the major focus of therapy is going to be exercise and weight loss.  To help you lose weight: The following behaviors have been associated with weight loss: Weigh yourself daily Write down everything you eat Drink a glass of water prior to eating a meal Only eat when you are  hungry Buy food from the periphery of the grocery store, not the middle  For deconditioning: We will refer you to Dr. Micheline Chapman for physical therapy and exercise therapy  For asthma: We will check a lung function test Keep taking Symbicort 2 puffs twice a day  For leg swelling and shortness of breath: We will check an echocardiogram and a blood test called a BNP to see if you are retaining too much fluid or if you have evidence of early heart failure  For obstructive sleep apnea: Because you feel that the pressure from your machine is inaccurate and your weight has changed some over the years we will get a CPAP titration study to make sure that the pressure we are using is appropriate for CPAP Do not drive while feeling sleepy  We will see you back in 6 to 8 weeks or sooner if needed  20 minutes spent with patient, 30-minute visit    Current Outpatient Medications:  .  aspirin 81 MG EC tablet, Take 81 mg by mouth daily., Disp: , Rfl:  .  atorvastatin (LIPITOR) 10 MG tablet, Take 10 mg by mouth daily., Disp: , Rfl:  .  budesonide-formoterol (SYMBICORT) 80-4.5 MCG/ACT inhaler, Inhale 2 puffs into the lungs 2 (two) times daily., Disp: 1 Inhaler, Rfl: 5 .  esomeprazole (NEXIUM) 40 MG capsule, Take 40 mg by mouth daily before breakfast., Disp: , Rfl:  .  furosemide (LASIX) 20 MG tablet, Take 20 mg by mouth daily. , Disp: , Rfl:  .  losartan-hydrochlorothiazide (HYZAAR) 100-25 MG per tablet, Take 1 tablet by mouth daily., Disp: , Rfl:  .  meloxicam (MOBIC) 15 MG tablet, Take 15 mg by mouth 2 (two) times daily. , Disp: , Rfl:  .  Spacer/Aero-Holding Chambers (AEROCHAMBER MV) inhaler, Use as instructed, Disp: 1 each, Rfl: 0 .  spironolactone (ALDACTONE) 25 MG tablet, Take 25 mg by mouth daily., Disp: , Rfl:

## 2018-04-21 NOTE — Patient Instructions (Signed)
To help you lose weight: The following behaviors have been associated with weight loss: Weigh yourself daily Write down everything you eat Drink a glass of water prior to eating a meal Only eat when you are hungry Buy food from the periphery of the grocery store, not the middle  For deconditioning: We will refer you to Dr. Micheline Chapman for physical therapy and exercise therapy  For asthma: We will check a lung function test Keep taking Symbicort 2 puffs twice a day  For leg swelling and shortness of breath: We will check an echocardiogram and a blood test called a BNP to see if you are retaining too much fluid or if you have evidence of early heart failure  For obstructive sleep apnea: Because you feel that the pressure from your machine is inaccurate and your weight has changed some over the years we will get a CPAP titration study to make sure that the pressure we are using is appropriate for CPAP Do not drive while feeling sleepy  We will see you back in 6 to 8 weeks or sooner if needed

## 2018-04-22 ENCOUNTER — Telehealth: Payer: Self-pay | Admitting: Pulmonary Disease

## 2018-04-22 NOTE — Telephone Encounter (Signed)
Pt is calling back [phone]

## 2018-04-22 NOTE — Telephone Encounter (Signed)
Called pt, no answer but there is no VM to leave message. Will try again later.    Notes recorded by Lupita Leash, MD on 04/22/2018 at 10:44 AM EDT A, Please let the patient know this was OK Thanks, B

## 2018-04-22 NOTE — Telephone Encounter (Signed)
Pt called back. Advised pt of results. Pt understood and nothing further is needed.

## 2018-04-23 ENCOUNTER — Ambulatory Visit (HOSPITAL_COMMUNITY)
Admission: RE | Admit: 2018-04-23 | Discharge: 2018-04-23 | Disposition: A | Discharge status: Home / Self Care | Payer: 59 | Source: Ambulatory Visit | Attending: Pulmonary Disease | Admitting: Pulmonary Disease

## 2018-04-23 DIAGNOSIS — I4510 Unspecified right bundle-branch block: Secondary | ICD-10-CM | POA: Diagnosis not present

## 2018-04-23 DIAGNOSIS — R0600 Dyspnea, unspecified: Secondary | ICD-10-CM

## 2018-04-23 DIAGNOSIS — Z6841 Body Mass Index (BMI) 40.0 and over, adult: Secondary | ICD-10-CM | POA: Diagnosis not present

## 2018-04-23 DIAGNOSIS — I10 Essential (primary) hypertension: Secondary | ICD-10-CM | POA: Insufficient documentation

## 2018-04-23 DIAGNOSIS — E785 Hyperlipidemia, unspecified: Secondary | ICD-10-CM | POA: Diagnosis not present

## 2018-04-23 DIAGNOSIS — J45909 Unspecified asthma, uncomplicated: Secondary | ICD-10-CM | POA: Insufficient documentation

## 2018-04-23 DIAGNOSIS — G4733 Obstructive sleep apnea (adult) (pediatric): Secondary | ICD-10-CM | POA: Diagnosis not present

## 2018-04-23 NOTE — Progress Notes (Signed)
*  PRELIMINARY RESULTS* Echocardiogram 2D Echocardiogram has been performed.  Kyle Burns 04/23/2018, 3:15 PM

## 2018-05-12 ENCOUNTER — Ambulatory Visit: Payer: 59 | Attending: Pulmonary Disease | Admitting: Pulmonary Disease

## 2018-05-12 DIAGNOSIS — R0600 Dyspnea, unspecified: Secondary | ICD-10-CM | POA: Diagnosis present

## 2018-05-12 DIAGNOSIS — G4733 Obstructive sleep apnea (adult) (pediatric): Secondary | ICD-10-CM | POA: Insufficient documentation

## 2018-05-13 DIAGNOSIS — G4714 Hypersomnia due to medical condition: Secondary | ICD-10-CM | POA: Diagnosis not present

## 2018-05-13 NOTE — Progress Notes (Signed)
BJ, We need to change him to BIPAP 17/13 settings. Thanks, B

## 2018-05-13 NOTE — Procedures (Signed)
Patient Name: Kyle Burns, Kyle Burns Date: 05/12/2018 Gender: Male D.O.B: [birth year] Age (years): 51 Referring Provider: Max Fickle Height (inches): 71 Interpreting Physician: Cyril Mourning MD, ABSM Weight (lbs): 288 RPSGT: Peak, Robert BMI: 40 MRN: [id number] Neck Size: 21.00 <br> <br> CLINICAL INFORMATION The patient is referred for a PAP titration to treat sleep apnea.  Date of Split Night : June 2016. Polysomnogram showed an AHI of 29. CPAP was titrated to 18 cm of water.???????  SLEEP STUDY TECHNIQUE As per the AASM Manual for the Scoring of Sleep and Associated Events v2.3 (April 2016) with a hypopnea requiring 4% desaturations.  The channels recorded and monitored were frontal, central and occipital EEG, electrooculogram (EOG), submentalis EMG (chin), nasal and oral airflow, thoracic and abdominal wall motion, anterior tibialis EMG, snore microphone, electrocardiogram, and pulse oximetry. Bilevel positive airway pressure (BPAP) was initiated at the beginning of the study and titrated to treat sleep-disordered breathing.  MEDICATIONS Medications self-administered by patient taken the night of the study : N/A  RESPIRATORY PARAMETERS Optimal IPAP Pressure (cm): 17 AHI at Optimal Pressure (/hr) 0.9 Optimal EPAP Pressure (cm): 13   Overall Minimal O2 (%): 75.0 Minimal O2 at Optimal Pressure (%): 88.0 SLEEP ARCHITECTURE Start Time: 10:33:57 PM Stop Time: 5:29:58 AM Total Time (min): 416 Total Sleep Time (min): 408.1 Sleep Latency (min): 0.4 Sleep Efficiency (%): 98.1% REM Latency (min): 54.5 WASO (min): 7.5 Stage N1 (%): 7.5% Stage N2 (%): 67.3% Stage N3 (%): 0.0% Stage R (%): 25.24 Supine (%): 100.00 Arousal Index (/hr): 7.2     CARDIAC DATA The 2 lead EKG demonstrated sinus rhythm. The mean heart rate was 69.1 beats per minute. Other EKG findings include: None. LEG MOVEMENT DATA The total Periodic Limb Movements of Sleep (PLMS) were 0. The PLMS index was 0.0. A PLMS  index of <15 is considered normal in adults.  IMPRESSIONS - An optimal BiPAP pressure was selected for this patient ( 17 /13 cm of water). Patient could not tolerate higher pressuer on CPAP during the study & events persisted on 15cm - Central sleep apnea was not noted during this titration (CAI = 0.1/h). - Severe oxygen desaturations were observed during this titration (min O2 = 75.0%). - The patient snored with loud snoring volume. - No cardiac abnormalities were observed during this study. - Clinically significant periodic limb movements were not noted during this study. Arousals associated with PLMs were rare.   DIAGNOSIS - Obstructive Sleep Apnea (327.23 [G47.33 ICD-10])    RECOMMENDATIONS - Trial of BiPAP therapy on 17/13 cm H2O with a Small Wide size Resmed Nasal CPAP Mask AirFit N30i mask and heated humidification. - Avoid alcohol, sedatives and other CNS depressants that may worsen sleep apnea and disrupt normal sleep architecture. - Sleep hygiene should be reviewed to assess factors that may improve sleep quality. - Weight management and regular exercise should be initiated or continued. - Return to [facility] Center for re-evaluation after 4 weeks of therapy   Cyril Mourning MD Board Certified in Sleep medicine

## 2018-05-14 ENCOUNTER — Other Ambulatory Visit: Payer: Self-pay | Admitting: Pulmonary Disease

## 2018-05-14 DIAGNOSIS — G4733 Obstructive sleep apnea (adult) (pediatric): Secondary | ICD-10-CM

## 2018-05-19 ENCOUNTER — Encounter: Payer: Self-pay | Admitting: Pulmonary Disease

## 2018-05-20 NOTE — Telephone Encounter (Signed)
BQ please advise, patient is requesting results from the CPAP titration.   Thank you.

## 2018-06-03 NOTE — Progress Notes (Signed)
@Patient  ID: Kyle Burns, male    DOB: [birth year], 52 y.o.   MRN: [id number]  Chief Complaint  Patient presents with  . Follow-up    PFT today    Referring provider: Joette Catching, MD  HPI: This is a very pleasant lifelong nonsmoker who first saw [facility] in 2015 for evaluation of shortness of breath. He had previously been seen by cardiology and had a normal stress test in 2015. He also has a history of a normal echocardiogram in 2013. He was told as a child that he has asthma. A chest x-ray in 2015 demonstrated a left hemidiaphragm elevation. Followup fluoroscopy of the diaphragm showed no movement of the left diaphragm. He has no history of trauma. He was diagnosed with obstructive sleep apnea in June 2016. Polysomnogram showed an AHI of 29. CPAP was titrated to 18 cm of water.  Recent [facility] Encounters:  04/21/2018-office visit-[facility] Patient returns the office under recommendations from PCP due to CPAP shortness of breath.  Patient reports he continues to struggle with dyspnea.  Patient also reporting he stopped using his CPAP for a while now does not feel the pressures in the forearm. Plan: Pulmonary function test, echocardiogram, CPAP titration, chest x-ray, BNP, referral to physical therapy, keep taking Symbicort 2 puffs twice a day, follow-up in 6 to 8 weeks   Tests:  Polysomnogram showed an AHI of 29. CPAP was titrated to 18 cm of water. 05/12/2018- CPAP titration study transitioned to BiPAP-and optimal BiPAP pressure was selected for this patient 17/13 cm of water  Imaging:  04/21/2018-chest x-ray- chronic elevation of left hemidiaphragm, no acute findings 09/18/2014-CT chest without contrast- no evidence of thoracic lymphedema or mediastinal mass lesion, elevation of left hemidiaphragm, 3 mm right upper lobe pulmonary nodule   Cardiac:  04/23/2018-echocardiogram- LV ejection fraction 60 to 65%, mild LVH,   Labs:  04/21/2018-BNP-13   Micro:    Chart Review:     06/04/18 OV Pleasant 52 year old patient seen in office today after completing pulmonary function test.  Patient recently worked up by [name] a month ago for progressive dyspnea.  The patient had echocardiogram, BNP, referral to physical therapy, and PFTs ordered.  Patient also at that time was not wearing his CPAP.  Patient is now on BiPAP.  Compliance report showing that patient has worn 10 out of the last 10 days, with 8 of those days being greater than 4 hours.  Patient reporting his average usage is about 4 hours and 33 minutes.  Patient confirms this.  Patient's AHI over the last 10 days with use is 0.1.  Unfortunately patient has not followed up with physical therapy.  He admits that they have contacted him but he is not followed back up with them.  He knows that he needs to do this.  Patient reports that his symptoms have been a little better with using his Symbicort.  Patient does report that he was not using his Symbicort correctly.  Patient will correct that and will do the 2 puffs twice daily as initially discussed.  Patient is using a spacer.  No Known Allergies  Immunization History  Administered Date(s) Administered  . Influenza Split 10/15/2013, 09/14/2014, 08/30/2015  . Influenza-Unspecified 10/14/2017    Past Medical History:  Diagnosis Date  . Back pain   . GERD (gastroesophageal reflux disease)   . HTN (hypertension)     Tobacco History: Social History   Tobacco Use  Smoking Status Never Smoker  Smokeless Tobacco Current  User  . Types: Chew  Tobacco Comment   Uses chew "when he has a bad day"   Ready to quit: Not Answered Counseling given: Yes Comment: Uses chew "when he has a bad day"   Outpatient Encounter Medications as of 06/04/2018  Medication Sig  . aspirin 81 MG EC tablet Take 81 mg by mouth daily.  Marland Kitchen. atorvastatin (LIPITOR) 10 MG tablet Take 10 mg by mouth daily.  . budesonide-formoterol (SYMBICORT) 80-4.5 MCG/ACT  inhaler Inhale 2 puffs into the lungs 2 (two) times daily.  Marland Kitchen. esomeprazole (NEXIUM) 40 MG capsule Take 40 mg by mouth daily before breakfast.  . furosemide (LASIX) 20 MG tablet Take 20 mg by mouth daily.   Marland Kitchen. losartan-hydrochlorothiazide (HYZAAR) 100-25 MG per tablet Take 1 tablet by mouth daily.  . meloxicam (MOBIC) 15 MG tablet Take 15 mg by mouth 2 (two) times daily.   Marland Kitchen. Spacer/Aero-Holding Chambers (AEROCHAMBER MV) inhaler Use as instructed  . spironolactone (ALDACTONE) 25 MG tablet Take 25 mg by mouth daily.  . budesonide-formoterol (SYMBICORT) 80-4.5 MCG/ACT inhaler Inhale 2 puffs into the lungs every 12 (twelve) hours.   No facility-administered encounter medications on file as of 06/04/2018.      Review of Systems  Constitutional:  +fatigue  No  weight loss, night sweats,  fevers, chills HEENT:   No headaches,  Difficulty swallowing,  Tooth/dental problems, or  Sore throat, No sneezing, itching, ear ache, nasal congestion, post nasal drip  CV: No chest pain,  orthopnea, PND, swelling in lower extremities, anasarca, dizziness, palpitations, syncope  GI: No heartburn, indigestion, abdominal pain, nausea, vomiting, diarrhea, change in bowel habits, loss of appetite, bloody stools Resp: +occasional sob with exertion   No shortness of breath at rest.  No excess mucus, no productive cough,  No non-productive cough,  No coughing up of blood.  No change in color of mucus.  No wheezing.  No chest wall deformity Skin: no rash, lesions, no skin changes. GU: no dysuria, change in color of urine, no urgency or frequency.  No flank pain, no hematuria  MS:  No joint pain or swelling.  No decreased range of motion.  No back pain. Psych:  No change in mood or affect. No depression or anxiety.  No memory loss.   Physical Exam  BP 118/70   Pulse 85   Ht 5\' 11"  (1.803 m)   Wt 290 lb (131.5 kg)   SpO2 97%   BMI 40.45 kg/m   Wt Readings from Last 3 Encounters:  06/04/18 290 lb (131.5 kg)    04/21/18 288 lb 3.2 oz (130.7 kg)  10/18/16 281 lb (127.5 kg)   >>>weight increasing - discussed with patient   GEN: A/Ox3; pleasant , NAD, well nourished  HEENT:  [place]/AT,  EACs-clear, TMs-wnl, NOSE-clear, THROAT-clear, mallampati III, no lesions, no postnasal drip or exudate noted.  NECK:  Supple w/ fair ROM; no JVD;  no thyromegaly or nodules palpated; no lymphadenopathy.   RESP:  Clear  P & A; w/o, diminished in bases, wheezes/ rales/ or rhonchi. no accessory muscle use, no dullness to percussion CARD:  RRR, no m/r/g, no peripheral edema, pulses intact, no cyanosis or clubbing. Musco: Warm bil, no deformities or joint swelling noted.  Neuro: alert, no focal deficits noted.   Skin: Warm, no lesions or rashes    Lab Results:  CBC No results found for: WBC, RBC, HGB, HCT, PLT, MCV, MCH, MCHC, RDW, LYMPHSABS, MONOABS, EOSABS, BASOSABS  BMET No results found for: NA,  K, CL, CO2, GLUCOSE, BUN, CREATININE, CALCIUM, GFRNONAA, GFRAA  BNP No results found for: BNP  ProBNP    Component Value Date/Time   PROBNP 13.0 04/21/2018 1643    Imaging: No results found.   Assessment & Plan:   Pleasant 52 year old patient seen in office today.  PFT completed showing some restriction.  No obstruction seen.  No significant response to bronchodilators.  Discussed results with patient.  Emphasized the importance of losing weight, wearing BiPAP daily, and continuing using Symbicort.  Patient to work aggressively on managing diet as well as exercise.  Losing weight can help manage her obstructive sleep apnea as well as a multitude of other comorbidities.  Patient to follow-up with [name] in 4 months.  Left hemidiaphragm paralysis Continue BiPAP use >>>Follow-up with our office if you have any difficulties using this device  We will have you follow back up with [name] in 4 months >>> Follow-up with our office if you have any changes in your breathing or concerns we will gladly see  you sooner than this     Obstructive sleep apnea Continue BiPAP use >>>Follow-up with our office if you have any difficulties using this device  We will have you follow back up with [name] in 4 months >>> Follow-up with our office if you have any changes in your breathing or concerns we will gladly see you sooner than this  Continue to focus on working on diet, working towards a healthy weight Make sure you follow-up with physical therapy to work on conditioning    Morbid obesity Discussed increasing weight Patient to continue to work on diet, exercise Patient to respond to physical therapy referral from last appointment Patient to work on eating healthy whole foods, specifically shopping the periphery of the grocery store, avoiding high carbohydrate or processed foods Continue to avoid fast food or eating out Working on a healthy appropriate diet can help your breathing as well as other chronic diseases     Coral Ceo, NP 06/04/2018

## 2018-06-04 ENCOUNTER — Ambulatory Visit: Payer: 59 | Admitting: Pulmonary Disease

## 2018-06-04 ENCOUNTER — Ambulatory Visit (INDEPENDENT_AMBULATORY_CARE_PROVIDER_SITE_OTHER): Payer: 59 | Admitting: Pulmonary Disease

## 2018-06-04 ENCOUNTER — Encounter: Payer: Self-pay | Admitting: Pulmonary Disease

## 2018-06-04 DIAGNOSIS — R0600 Dyspnea, unspecified: Secondary | ICD-10-CM | POA: Diagnosis not present

## 2018-06-04 DIAGNOSIS — G4733 Obstructive sleep apnea (adult) (pediatric): Secondary | ICD-10-CM | POA: Diagnosis not present

## 2018-06-04 DIAGNOSIS — J986 Disorders of diaphragm: Secondary | ICD-10-CM

## 2018-06-04 LAB — PULMONARY FUNCTION TEST
DL/VA % pred: 118 %
DL/VA: 5.54 ml/min/mmHg/L
DLCO unc % pred: 62 %
DLCO unc: 21.16 ml/min/mmHg
FEF 25-75 Post: 2.64 L/sec
FEF 25-75 Pre: 3.09 L/sec
FEF2575-%CHANGE-POST: -14 %
FEF2575-%Pred-Post: 80 %
FEF2575-%Pred-Pre: 94 %
FEV1-%CHANGE-POST: 0 %
FEV1-%PRED-POST: 62 %
FEV1-%PRED-PRE: 62 %
FEV1-PRE: 2.11 L
FEV1-Post: 2.11 L
FEV1FVC-%Change-Post: 1 %
FEV1FVC-%Pred-Pre: 110 %
FEV6-%CHANGE-POST: 0 %
FEV6-%PRED-PRE: 57 %
FEV6-%Pred-Post: 57 %
FEV6-PRE: 2.39 L
FEV6-Post: 2.38 L
FEV6FVC-%PRED-PRE: 103 %
FEV6FVC-%Pred-Post: 103 %
FVC-%CHANGE-POST: -1 %
FVC-%PRED-PRE: 56 %
FVC-%Pred-Post: 55 %
FVC-POST: 2.38 L
FVC-Pre: 2.41 L
POST FEV1/FVC RATIO: 89 %
POST FEV6/FVC RATIO: 100 %
PRE FEV6/FVC RATIO: 100 %
Pre FEV1/FVC ratio: 87 %
RV % PRED: 78 %
RV: 1.7 L
TLC % pred: 68 %
TLC: 4.89 L

## 2018-06-04 MED ORDER — BUDESONIDE-FORMOTEROL FUMARATE 80-4.5 MCG/ACT IN AERO: 2.0000 | INHALATION_SPRAY | Freq: Two times a day (BID) | RESPIRATORY_TRACT | 0 refills | Status: DC

## 2018-06-04 NOTE — Assessment & Plan Note (Signed)
Continue BiPAP use >>>Follow-up with our office if you have any difficulties using this device  We will have you follow back up with Dr. Kendrick FriesMcquaid in 4 months >>> Follow-up with our office if you have any changes in your breathing or concerns we will gladly see you sooner than this

## 2018-06-04 NOTE — Assessment & Plan Note (Signed)
Continue BiPAP use >>>Follow-up with our office if you have any difficulties using this device  We will have you follow back up with Dr. Kendrick FriesMcquaid in 4 months >>> Follow-up with our office if you have any changes in your breathing or concerns we will gladly see you sooner than this  Continue to focus on working on diet, working towards a healthy weight Make sure you follow-up with physical therapy to work on conditioning

## 2018-06-04 NOTE — Assessment & Plan Note (Signed)
Discussed increasing weight Patient to continue to work on diet, exercise Patient to respond to physical therapy referral from last appointment Patient to work on eating healthy whole foods, specifically shopping the periphery of the grocery store, avoiding high carbohydrate or processed foods Continue to avoid fast food or eating out Working on a healthy appropriate diet can help your breathing as well as other chronic diseases

## 2018-06-04 NOTE — Progress Notes (Signed)
PFT completed today. 06/04/18  

## 2018-06-04 NOTE — Patient Instructions (Addendum)
Continue BiPAP use >>>Follow-up with our office if you have any difficulties using this device  Continue Symbicort >>>2 puffs in the morning right when you wake up, rinse out your mouth, 12 hours later repeat 2 more puffs, rinse out your mouth >>>Take this daily  We will have you follow back up with Dr. Kendrick FriesMcquaid in 4 months >>> Follow-up with our office if you have any changes in your breathing or concerns we will gladly see you sooner than this  Continue to focus on working on diet, working towards a healthy weight Make sure you follow-up with physical therapy to work on conditioning     Please contact the office if your symptoms worsen or you have concerns that you are not improving.   Thank you for choosing [facility] for your healthcare, and for allowing us to partner with you on your healthcare journey. I am thankful to be able to provide care to you today.   Elisha HeadlandBrian Mack FNP-C

## 2018-06-05 NOTE — Progress Notes (Signed)
Reviewed, agree Restriction is due to obesity and elevated left hemidiaphragm paralysis

## 2019-07-08 ENCOUNTER — Encounter: Payer: Self-pay | Admitting: Pulmonary Disease

## 2019-07-08 ENCOUNTER — Telehealth (INDEPENDENT_AMBULATORY_CARE_PROVIDER_SITE_OTHER): Payer: 59 | Admitting: Pulmonary Disease

## 2019-07-08 ENCOUNTER — Encounter: Payer: Self-pay | Admitting: *Deleted

## 2019-07-08 DIAGNOSIS — J449 Chronic obstructive pulmonary disease, unspecified: Secondary | ICD-10-CM | POA: Diagnosis not present

## 2019-07-08 DIAGNOSIS — J986 Disorders of diaphragm: Secondary | ICD-10-CM | POA: Diagnosis not present

## 2019-07-08 DIAGNOSIS — J4489 Other specified chronic obstructive pulmonary disease: Secondary | ICD-10-CM

## 2019-07-08 DIAGNOSIS — G4733 Obstructive sleep apnea (adult) (pediatric): Secondary | ICD-10-CM | POA: Diagnosis not present

## 2019-07-08 NOTE — Assessment & Plan Note (Signed)
Plan: Continue BiPAP Follow-up in 1 year

## 2019-07-08 NOTE — Progress Notes (Signed)
Virtual Visit via Video Note  I connected with Kyle Burns on 07/08/19 at 12:00 PM EDT by a video enabled telemedicine application and verified that I am speaking with the correct person using two identifiers.  Location: Patient: Home Provider: Office - [facility] - [address], [address], [place], [place] [address]  I discussed the limitations of evaluation and management by telemedicine and the availability of in person appointments. The patient expressed understanding and agreed to proceed. I also discussed with the patient that there may be a patient responsible charge related to this service. The patient expressed understanding and agreed to proceed.  Patient consented to consult via telephone: Yes People present and their role in pt care: Pt   History of Present Illness:  53 year old male followed in our office for moderate obstructive sleep apnea, 2015 chest x-ray demonstrated left hemo-diaphragm elevation  Past medical history: Obesity, GERD, high blood pressure, hyperlipidemia Smoking history: Never smoker Maintenance: Symbicort 80 Patient of Dr. Lake Bells  Chief complaint: BiPAP follow up    53 year old male never smoker followed in our office for moderate obstructive sleep apnea as well as chronic left hemidiaphragm elevation.  Patient is currently managed on BiPAP therapy.  Patient reports that the BiPAP therapy is going well.  BiPAP compliance report confirms this.  See compliance report listed below  06/08/2019-07/07/2019 20-30 had a last 30 days use, 22 those days greater than 4 hours, 5 hours and 13 minutes average use, IPAP 17, EPAP 13, AHI 0.2  Patient reports that when he started on BiPAP he was given a nasal mask which worked well.  The nasal mask broke in July and then he has had to use his old CPAP fullface mask which he has been having lots of leaks.  CPAP compliance report confirms this.  Patient reports the cost of a replacement mask was over $80.   Patient also reports that the monthly cost of his BiPAP has been quite high 88 bucks a month plus an additional charge of around $50 a month.  He says this is different than his initial quote from [place] which was given be around $17 a month.  He is wondering if there is any way to reduce these cost.  Observations/Objective:  06/04/2018-pulmonary function test- FVC 2.41 (56% predicted), postbronchodilator ratio 89, postbronchodilator FEV1 2.11 (62% predicted), no bronchodilator response, DLCO 62  Polysomnogram showed an AHI of 29. CPAP was titrated to 18 cm of water. 05/12/2018- CPAP titration study transitioned to BiPAP-and optimal BiPAP pressure was selected for this patient 17/13 cm of water  Imaging:  04/21/2018-chest x-ray- chronic elevation of left hemidiaphragm, no acute findings 09/18/2014-CT chest without contrast- no evidence of thoracic lymphedema or mediastinal mass lesion, elevation of left hemidiaphragm, 3 mm right upper lobe pulmonary nodule  Cardiac:  04/23/2018-echocardiogram- LV ejection fraction 60 to 65%, mild LVH  Labs:  04/21/2018-BNP-13  Assessment and Plan:  Left hemidiaphragm paralysis Plan: Continue BiPAP Follow-up in 1 year  Obstructive sleep apnea Plan: Continue BiPAP Mask samples provided to patient today, patient to pick up at his convenience 26-month compliance download We will follow-up with another DME company to evaluate patient's out-of-pocket cost Follow-up in 1 year  Morbid obesity Plan: Continue to work towards reducing weight and BMI  Chronic obstructive airway disease with asthma Plan: Continue Symbicort 80  Follow Up Instructions:  Return in about 1 year (around 07/07/2020), or if symptoms worsen or fail to improve, for Follow up with Wyn Quaker FNP-C.  I discussed the assessment and treatment plan with the patient. The patient was provided an opportunity to ask questions and all were answered. The patient agreed with the  plan and demonstrated an understanding of the instructions.   The patient was advised to call back or seek an in-person evaluation if the symptoms worsen or if the condition fails to improve as anticipated.  I provided 18 minutes of non-face-to-face time during this encounter.   Coral CeoBrian P Raechelle Sarti, NP

## 2019-07-08 NOTE — Assessment & Plan Note (Signed)
Plan: Continue to work towards reducing weight and BMI

## 2019-07-08 NOTE — Assessment & Plan Note (Signed)
Plan: Continue BiPAP Mask samples provided to patient today, patient to pick up at his convenience 49-month compliance download We will follow-up with another DME company to evaluate patient's out-of-pocket cost Follow-up in 1 year

## 2019-07-08 NOTE — Progress Notes (Signed)
Reviewed, agree 

## 2019-07-08 NOTE — Patient Instructions (Addendum)
We will look to see if we have any sample mask for you to use with your BiPAP  We will also follow-up with a different DME company to see if they could provide similar care with lower cost    We recommend that you continue using your BIPAP daily >>>Keep up the hard work using your device >>> Goal should be wearing this for the entire night that you are sleeping, at least 4 to 6 hours  Remember:  . Do not drive or operate heavy machinery if tired or drowsy.  . Please notify the supply company and office if you are unable to use your device regularly due to missing supplies or machine being broken.  . Work on maintaining a healthy weight and following your recommended nutrition plan  . Maintain proper daily exercise and movement  . Maintaining proper use of your device can also help improve management of other chronic illnesses such as: Blood pressure, blood sugars, and weight management.   BiPAP/ CPAP Cleaning:  >>>Clean weekly, with Dawn soap, and bottle brush.  Set up to air dry.   Return in about 1 year (around 07/07/2020), or if symptoms worsen or fail to improve, for Follow up with Elisha HeadlandBrian Jailen Coward FNP-C.    Coronavirus (COVID-19) Are you at risk?  Are you at risk for the Coronavirus (COVID-19)?  To be considered HIGH RISK for Coronavirus (COVID-19), you have to meet the following criteria:  . Traveled to [place], [place], [place], [place] or [place]; or in the [place] to [place], [place], or [place]; and have fever, cough, and shortness of breath within the last 2 weeks of travel OR . Been in close contact with a person diagnosed with COVID-19 within the last 2 weeks and have fever, cough, and shortness of breath . IF YOU DO NOT MEET THESE CRITERIA, YOU ARE CONSIDERED LOW RISK FOR COVID-19.  What to do if you are HIGH RISK for COVID-19?  Marland Kitchen. If you are having a medical emergency, call 911. . Seek medical care right away. Before you go to a doctor's office, urgent  care or emergency department, call ahead and tell them about your recent travel, contact with someone diagnosed with COVID-19, and your symptoms. You should receive instructions from your physician's office regarding next steps of care.  . When you arrive at healthcare provider, tell the healthcare staff immediately you have returned from visiting [place], [place], [place], [place] or [place]; or traveled in the [place] to [place], [place], [place], or [place]; in the last two weeks or you have been in close contact with a person diagnosed with COVID-19 in the last 2 weeks.   . Tell the health care staff about your symptoms: fever, cough and shortness of breath. . After you have been seen by a medical provider, you will be either: o Tested for (COVID-19) and discharged home on quarantine except to seek medical care if symptoms worsen, and asked to  - Stay home and avoid contact with others until you get your results (4-5 days)  - Avoid travel on public transportation if possible (such as bus, train, or airplane) or o Sent to the Emergency Department by EMS for evaluation, COVID-19 testing, and possible admission depending on your condition and test results.  What to do if you are LOW RISK for COVID-19?  Reduce your risk of any infection by using the same precautions used for avoiding the common cold or flu:  Marland Kitchen. Wash  your hands often with soap and warm water for at least 20 seconds.  If soap and water are not readily available, use an alcohol-based hand sanitizer with at least 60% alcohol.  . If coughing or sneezing, cover your mouth and nose by coughing or sneezing into the elbow areas of your shirt or coat, into a tissue or into your sleeve (not your hands). . Avoid shaking hands with others and consider head nods or verbal greetings only. . Avoid touching your eyes, nose, or mouth with unwashed hands.  . Avoid close contact with people who are sick. . Avoid places or events with large  numbers of people in one location, like concerts or sporting events. . Carefully consider travel plans you have or are making. . If you are planning any travel outside or inside the [place], visit the CDC's Travelers' Health webpage for the latest health notices. . If you have some symptoms but not all symptoms, continue to monitor at home and seek medical attention if your symptoms worsen. . If you are having a medical emergency, call 911.   [place] / e-Visit: [email or web address]         [facility]: [facility]: [phone]                   [facility]: [phone]           It is flu season:   >>> Best ways to protect herself from the flu: Receive the yearly flu vaccine, practice good hand hygiene washing with soap and also using hand sanitizer when available, eat a nutritious meals, get adequate rest, hydrate appropriately   Please contact the office if your symptoms worsen or you have concerns that you are not improving.   Thank you for choosing [facility] for your healthcare, and for allowing [place] to partner with you on your healthcare journey. I am thankful to be able to provide care to you today.   Wyn Quaker FNP-C

## 2019-07-08 NOTE — Assessment & Plan Note (Addendum)
Plan: Continue Symbicort 80 

## 2019-07-22 DIAGNOSIS — G4733 Obstructive sleep apnea (adult) (pediatric): Secondary | ICD-10-CM

## 2019-07-22 NOTE — Telephone Encounter (Signed)
Spoke with pt. He is needing an order sent to [facility] for BiPAP supplies. Pt is needing a certain mask, this will have to be special ordered for him through his DME. Order has been placed for BiPAP. Nothing further was needed.

## 2019-11-15 ENCOUNTER — Other Ambulatory Visit: Payer: Self-pay

## 2019-11-16 ENCOUNTER — Ambulatory Visit (INDEPENDENT_AMBULATORY_CARE_PROVIDER_SITE_OTHER): Payer: 59 | Admitting: Family Medicine

## 2019-11-16 ENCOUNTER — Encounter: Payer: Self-pay | Admitting: Family Medicine

## 2019-11-16 VITALS — BP 113/71 | HR 81 | Temp 98.7°F | Resp 20 | Ht 71.0 in | Wt 293.0 lb

## 2019-11-16 DIAGNOSIS — Z125 Encounter for screening for malignant neoplasm of prostate: Secondary | ICD-10-CM

## 2019-11-16 DIAGNOSIS — Z6841 Body Mass Index (BMI) 40.0 and over, adult: Secondary | ICD-10-CM

## 2019-11-16 DIAGNOSIS — J449 Chronic obstructive pulmonary disease, unspecified: Secondary | ICD-10-CM | POA: Diagnosis not present

## 2019-11-16 DIAGNOSIS — G4733 Obstructive sleep apnea (adult) (pediatric): Secondary | ICD-10-CM

## 2019-11-16 DIAGNOSIS — I10 Essential (primary) hypertension: Secondary | ICD-10-CM

## 2019-11-16 DIAGNOSIS — K219 Gastro-esophageal reflux disease without esophagitis: Secondary | ICD-10-CM | POA: Diagnosis not present

## 2019-11-16 DIAGNOSIS — M1990 Unspecified osteoarthritis, unspecified site: Secondary | ICD-10-CM | POA: Insufficient documentation

## 2019-11-16 DIAGNOSIS — E782 Mixed hyperlipidemia: Secondary | ICD-10-CM

## 2019-11-16 MED ORDER — FUROSEMIDE 20 MG PO TABS: 20.0000 mg | ORAL_TABLET | Freq: Every day | ORAL | 6 refills | Status: DC

## 2019-11-16 MED ORDER — ALBUTEROL SULFATE HFA 108 (90 BASE) MCG/ACT IN AERS: INHALATION_SPRAY | RESPIRATORY_TRACT | 11 refills | Status: DC

## 2019-11-16 MED ORDER — MELOXICAM 15 MG PO TABS: 15.0000 mg | ORAL_TABLET | Freq: Every day | ORAL | 2 refills | Status: AC

## 2019-11-16 MED ORDER — LOSARTAN POTASSIUM-HCTZ 100-25 MG PO TABS: 1.0000 | ORAL_TABLET | Freq: Every day | ORAL | 2 refills | Status: DC

## 2019-11-16 MED ORDER — ATORVASTATIN CALCIUM 10 MG PO TABS: 10.0000 mg | ORAL_TABLET | Freq: Every day | ORAL | 2 refills | Status: DC

## 2019-11-16 MED ORDER — BUDESONIDE-FORMOTEROL FUMARATE 80-4.5 MCG/ACT IN AERO: 2.0000 | INHALATION_SPRAY | Freq: Two times a day (BID) | RESPIRATORY_TRACT | 11 refills | Status: DC

## 2019-11-16 MED ORDER — SPIRONOLACTONE 25 MG PO TABS: 25.0000 mg | ORAL_TABLET | Freq: Every day | ORAL | 2 refills | Status: DC

## 2019-11-16 NOTE — Progress Notes (Signed)
Subjective:  Patient ID: Kyle Burns, male    DOB: [birth year], 53 y.o.   MRN: [id number]  Patient Care Team: Dione Housekeeper, MD as PCP - General (Family Medicine) Verl Blalock, Marijo Conception, MD (Inactive) as Attending Physician (Cardiology)   Chief Complaint:  Establish Care (new - [place] )   HPI: Kyle Burns is a 53 y.o. male presenting on 11/16/2019 for Establish Care (new - [place] )   Pt presents today to establish with new PCP as his previous PCP retired. Pt is also here for management of chronic medical conditions. 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 He states he has been taking zinc and Vit C since the pandemic started. He has a history of HTN that has been well controlled. States he takes medications as prescribed without associated side effects. He does try to watch diet. Does try to stay active.  He has a history of asthma and is on Symbicort and Albuterol. Uses as prescribed. SABA use is infrequent.  He has a history of GERD and is on omeprazole for control. States this is working well. No red flags such as cough, voice change, dysphagia, hemoptysis, melena, or hematochezia.  Pt has OSA and a history of left hemidiaphragm paralysis. He does use a BiPAP and is tolerating therapy well. He is followed by pulmonology.  He has hyperlipidemia. He does take his statin as prescribed without associated side effects. He watches diet but does not exercise on a regular basis.  He does suffer from arthritic pain. States he usually manages this with tylenol or rest when the meloxicam does not work.        Relevant past medical, surgical, family, and social history reviewed and updated as indicated.  Allergies and medications reviewed and updated. Date reviewed: Chart in Epic.   Past Medical History:  Diagnosis Date  . Asthma    hx of   . Back pain   . Bilevel positive airway pressure (BPAP) dependence   . GERD (gastroesophageal reflux disease)   . HTN  (hypertension)   . Hyperlipidemia     Past Surgical History:  Procedure Laterality Date  . bilateral hernia repair     total of 3   . CYST REMOVAL NECK     age - 59s    Social History   Socioeconomic History  . Marital status: Single    Spouse name: Not on file  . Number of children: 0  . Years of education: Not on file  . Highest education level: Not on file  Occupational History  . Not on file  Social Needs  . Financial resource strain: Not on file  . Food insecurity    Worry: Not on file    Inability: Not on file  . Transportation needs    Medical: Not on file    Non-medical: Not on file  Tobacco Use  . Smoking status: Never Smoker  . Smokeless tobacco: Current User    Types: Chew  . Tobacco comment: Uses chew "when he has a bad day"  Substance and Sexual Activity  . Alcohol use: No    Comment: Socially  . Drug use: No  . Sexual activity: Not on file  Lifestyle  . Physical activity    Days per week: Not on file    Minutes per session: Not on file  . Stress: Not on file  Relationships  . Social Herbalist on phone: Not  on file    Gets together: Not on file    Attends religious service: Not on file    Active member of club or organization: Not on file    Attends meetings of clubs or organizations: Not on file    Relationship status: Not on file  . Intimate partner violence    Fear of current or ex partner: Not on file    Emotionally abused: Not on file    Physically abused: Not on file    Forced sexual activity: Not on file  Other Topics Concern  . Not on file  Social History Narrative  . Not on file    Outpatient Encounter Medications as of 11/16/2019  Medication Sig  . albuterol (VENTOLIN HFA) 108 (90 Base) MCG/ACT inhaler USE 2 PUFFS EVERY 6 HOURS AS NEEDED FOR WHEEZING  . aspirin 81 MG EC tablet Take 162 mg by mouth daily.   Marland Kitchen atorvastatin (LIPITOR) 10 MG tablet Take 1 tablet (10 mg total) by mouth daily.  . budesonide-formoterol  (SYMBICORT) 80-4.5 MCG/ACT inhaler Inhale 2 puffs into the lungs 2 (two) times daily.  . furosemide (LASIX) 20 MG tablet Take 1 tablet (20 mg total) by mouth daily.  Marland Kitchen losartan-hydrochlorothiazide (HYZAAR) 100-25 MG tablet Take 1 tablet by mouth daily.  . meloxicam (MOBIC) 15 MG tablet Take 1 tablet (15 mg total) by mouth daily.  Marland Kitchen Spacer/Aero-Holding Chambers (AEROCHAMBER MV) inhaler Use as instructed  . spironolactone (ALDACTONE) 25 MG tablet Take 1 tablet (25 mg total) by mouth daily.  . [DISCONTINUED] albuterol (VENTOLIN HFA) 108 (90 Base) MCG/ACT inhaler USE 2 PUFFS EVERY 6 HOURS AS NEEDED FOR WHEEZING  . [DISCONTINUED] atorvastatin (LIPITOR) 10 MG tablet Take 10 mg by mouth daily.  . [DISCONTINUED] budesonide-formoterol (SYMBICORT) 80-4.5 MCG/ACT inhaler Inhale 2 puffs into the lungs 2 (two) times daily.  . [DISCONTINUED] furosemide (LASIX) 20 MG tablet Take 20 mg by mouth daily.   . [DISCONTINUED] losartan-hydrochlorothiazide (HYZAAR) 100-25 MG per tablet Take 1 tablet by mouth daily.  . [DISCONTINUED] meloxicam (MOBIC) 15 MG tablet Take 15 mg by mouth daily.   . [DISCONTINUED] spironolactone (ALDACTONE) 25 MG tablet Take 25 mg by mouth daily.  . [DISCONTINUED] budesonide-formoterol (SYMBICORT) 80-4.5 MCG/ACT inhaler Inhale 2 puffs into the lungs every 12 (twelve) hours.  . [DISCONTINUED] esomeprazole (NEXIUM) 40 MG capsule Take 40 mg by mouth daily before breakfast.   No facility-administered encounter medications on file as of 11/16/2019.     No Known Allergies  Review of Systems  Constitutional: Negative for activity change, appetite change, chills, diaphoresis, fatigue, fever and unexpected weight change.  HENT: Negative.  Negative for sore throat, trouble swallowing and voice change.   Eyes: Negative.  Negative for photophobia and visual disturbance.  Respiratory: Negative for cough, choking, chest tightness, shortness of breath, wheezing and stridor.   Cardiovascular: Negative  for chest pain, palpitations and leg swelling.  Gastrointestinal: Negative for abdominal pain, blood in stool, constipation, diarrhea, nausea and vomiting.  Endocrine: Negative.  Negative for cold intolerance, heat intolerance, polydipsia, polyphagia and polyuria.  Genitourinary: Negative for decreased urine volume, difficulty urinating, dysuria, frequency and urgency.  Musculoskeletal: Positive for arthralgias. Negative for myalgias.  Skin: Negative.  Negative for rash.  Allergic/Immunologic: Negative.   Neurological: Negative for dizziness, tremors, seizures, syncope, facial asymmetry, speech difficulty, weakness, light-headedness, numbness and headaches.  Hematological: Negative.   Psychiatric/Behavioral: Negative for confusion, hallucinations, sleep disturbance and suicidal ideas.  All other systems reviewed and are negative.  Objective:  BP 113/71   Pulse 81   Temp 98.7 F (37.1 C)   Resp 20   Ht '5\' 11"'$  (1.803 m)   Wt 293 lb (132.9 kg)   SpO2 96%   BMI 40.87 kg/m    Wt Readings from Last 3 Encounters:  11/16/19 293 lb (132.9 kg)  06/04/18 290 lb (131.5 kg)  04/21/18 288 lb 3.2 oz (130.7 kg)    Physical Exam Vitals signs and nursing note reviewed.  Constitutional:      General: He is not in acute distress.    Appearance: Normal appearance. He is well-developed and well-groomed. He is morbidly obese. He is not ill-appearing, toxic-appearing or diaphoretic.  HENT:     Head: Normocephalic and atraumatic.     Jaw: There is normal jaw occlusion.     Right Ear: Hearing normal.     Left Ear: Hearing normal.     Nose: Nose normal.     Mouth/Throat:     Lips: Pink.     Mouth: Mucous membranes are moist.     Pharynx: Oropharynx is clear. Uvula midline.  Eyes:     General: Lids are normal.     Extraocular Movements: Extraocular movements intact.     Conjunctiva/sclera: Conjunctivae normal.     Pupils: Pupils are equal, round, and reactive to light.  Neck:      Musculoskeletal: Normal range of motion and neck supple.     Thyroid: No thyroid mass, thyromegaly or thyroid tenderness.     Vascular: No carotid bruit or JVD.     Trachea: Trachea and phonation normal.  Cardiovascular:     Rate and Rhythm: Normal rate and regular rhythm.     Chest Wall: PMI is not displaced.     Pulses: Normal pulses.     Heart sounds: Normal heart sounds. No murmur. No friction rub. No gallop.   Pulmonary:     Effort: Pulmonary effort is normal. No respiratory distress.     Breath sounds: Normal breath sounds. No wheezing.  Abdominal:     General: Bowel sounds are normal. There is no distension or abdominal bruit.     Palpations: Abdomen is soft. There is no hepatomegaly or splenomegaly.     Tenderness: There is no abdominal tenderness. There is no right CVA tenderness or left CVA tenderness.     Hernia: No hernia is present.  Musculoskeletal: Normal range of motion.     Right lower leg: No edema.     Left lower leg: No edema.  Lymphadenopathy:     Cervical: No cervical adenopathy.  Skin:    General: Skin is warm and dry.     Capillary Refill: Capillary refill takes less than 2 seconds.     Coloration: Skin is not cyanotic, jaundiced or pale.     Findings: No rash.  Neurological:     General: No focal deficit present.     Mental Status: He is alert and oriented to person, place, and time.     Cranial Nerves: Cranial nerves are intact. No cranial nerve deficit.     Sensory: Sensation is intact. No sensory deficit.     Motor: Motor function is intact. No weakness.     Coordination: Coordination is intact. Coordination normal.     Gait: Gait is intact. Gait normal.     Deep Tendon Reflexes: Reflexes are normal and symmetric. Reflexes normal.  Psychiatric:        Attention and Perception: Attention and perception normal.  Mood and Affect: Mood and affect normal.        Speech: Speech normal.        Behavior: Behavior normal. Behavior is cooperative.         Thought Content: Thought content normal.        Cognition and Memory: Cognition and memory normal.        Judgment: Judgment normal.     Results for orders placed or performed in visit on 06/04/18  Pulmonary function test  Result Value Ref Range   FVC-Pre 2.41 L   FVC-%Pred-Pre 56 %   FVC-Post 2.38 L   FVC-%Pred-Post 55 %   FVC-%Change-Post -1 %   FEV1-Pre 2.11 L   FEV1-%Pred-Pre 62 %   FEV1-Post 2.11 L   FEV1-%Pred-Post 62 %   FEV1-%Change-Post 0 %   FEV6-Pre 2.39 L   FEV6-%Pred-Pre 57 %   FEV6-Post 2.38 L   FEV6-%Pred-Post 57 %   FEV6-%Change-Post 0 %   Pre FEV1/FVC ratio 87 %   FEV1FVC-%Pred-Pre 110 %   Post FEV1/FVC ratio 89 %   FEV1FVC-%Change-Post 1 %   Pre FEV6/FVC Ratio 100 %   FEV6FVC-%Pred-Pre 103 %   Post FEV6/FVC ratio 100 %   FEV6FVC-%Pred-Post 103 %   FEF 25-75 Pre 3.09 L/sec   FEF2575-%Pred-Pre 94 %   FEF 25-75 Post 2.64 L/sec   FEF2575-%Pred-Post 80 %   FEF2575-%Change-Post -14 %   RV 1.70 L   RV % pred 78 %   TLC 4.89 L   TLC % pred 68 %   DLCO unc 21.16 ml/min/mmHg   DLCO unc % pred 62 %   DL/VA 5.54 ml/min/mmHg/L   DL/VA % pred 118 %       Pertinent labs & imaging results that were available during my care of the patient were reviewed by me and considered in my medical decision making.  Assessment & Plan:  Omid was seen today for establish care.  Diagnoses and all orders for this visit:  Essential hypertension with goal blood pressure less than 130/80 BP well controlled. Changes were not made in regimen. Goal BP is 130/80. Pt aware to report any persistent high or low readings. DASH diet and exercise encouraged. Exercise at least 150 minutes per week and increase as tolerated. Goal BMI > 25. Stress management encouraged. Avoid nicotine and tobacco product use. Avoid excessive alcohol and NSAID's. Avoid more than 2000 mg of sodium daily. Medications as prescribed. Follow up as scheduled.  -     furosemide (LASIX) 20 MG tablet; Take 1  tablet (20 mg total) by mouth daily. -     losartan-hydrochlorothiazide (HYZAAR) 100-25 MG tablet; Take 1 tablet by mouth daily. -     spironolactone (ALDACTONE) 25 MG tablet; Take 1 tablet (25 mg total) by mouth daily. -     CBC with Differential/Platelet -     CMP14+EGFR -     Thyroid Panel With TSH  Chronic obstructive airway disease with asthma ([place]) Doing well with current medications. No recent exacerbations. Pt is followed by pulmonology.  -     albuterol (VENTOLIN HFA) 108 (90 Base) MCG/ACT inhaler; USE 2 PUFFS EVERY 6 HOURS AS NEEDED FOR WHEEZING -     budesonide-formoterol (SYMBICORT) 80-4.5 MCG/ACT inhaler; Inhale 2 puffs into the lungs 2 (two) times daily.  Class 3 severe obesity due to excess calories with serious comorbidity and body mass index (BMI) of 40.0 to 44.9 in adult [facility]) Diet and exercise encouraged. Labs pending.  -  CBC with Differential/Platelet -     CMP14+EGFR -     Lipid panel -     Thyroid Panel With TSH  Gastroesophageal reflux disease without esophagitis No red flags present. Diet discussed. Avoid fried, spicy, fatty, greasy, and acidic foods. Avoid caffeine, nicotine, and alcohol. Do not eat 2-3 hours before bedtime and stay upright for at least 1-2 hours after eating. Eat small frequent meals. Avoid NSAID's like motrin and aleve. Medications as prescribed. Report any new or worsening symptoms. Follow up as discussed or sooner if needed.   -     CBC with Differential/Platelet  Obstructive sleep apnea Doing well with BiPAP therapy. Keep follow up with pulmonology.   Mixed hyperlipidemia Diet encouraged - increase intake of fresh fruits and vegetables, increase intake of lean proteins. Bake, broil, or grill foods. Avoid fried, greasy, and fatty foods. Avoid fast foods. Increase intake of fiber-rich whole grains. Exercise encouraged - at least 150 minutes per week and advance as tolerated.  Goal BMI < 25. Continue medications as prescribed. Follow up in  3-6 months as discussed.  -     atorvastatin (LIPITOR) 10 MG tablet; Take 1 tablet (10 mg total) by mouth daily. -     Lipid panel  Chronic arthritis Does well with Mobic. Uses tylenol for breakthrough. Discussed topical Biofreeze. Report any new or worsening symptoms.  -     meloxicam (MOBIC) 15 MG tablet; Take 1 tablet (15 mg total) by mouth daily.  Screening for prostate cancer -     PSA, total and free     Continue all other maintenance medications.  Follow up plan: Return in about 6 months (around 05/16/2020), or if symptoms worsen or fail to improve.  Continue healthy lifestyle choices, including diet (rich in fruits, vegetables, and lean proteins, and low in salt and simple carbohydrates) and exercise (at least 30 minutes of moderate physical activity daily).  Educational handout given for survey, COVID-19  The above assessment and management plan was discussed with the patient. The patient verbalized understanding of and has agreed to the management plan. Patient is aware to call the clinic if they develop any new symptoms or if symptoms persist or worsen. Patient is aware when to return to the clinic for a follow-up visit. Patient educated on when it is appropriate to go to the emergency department.   Monia Pouch, FNP-C [facility] [phone]

## 2019-11-16 NOTE — Patient Instructions (Signed)
It was a pleasure seeing you today, Kyle Burns.  Information regarding what we discussed is included in this packet.  Please make an appointment to see me in 6 months.    If you had labs performed today, you will be contacted with the abnormal results once they are available, usually in the next 3 business days for routine lab work.  If you had STI testing, a pap smear, or a biopsy performed, expect to be contacted in about 7-10 days. If results are normal, you will not be notified.    In a few days you may receive a survey in the mail or online from [employer] regarding your visit with [place] today. Please take a moment to fill this out. Your feedback is very important to our office. It can help [place] better understand your needs as well as improve your experience and satisfaction. Thank you for taking your time to complete it. We care about you.  Because of recent events of COVID-19 ("Coronavirus"), please follow CDC recommendations:   1. Wash your hand frequently 2. Avoid touching your face 3. Stay away from people who are sick 4. If you have symptoms such as fever, cough, shortness of breath then call your healthcare provider for further guidance 5. If you are sick, STAY AT HOME, unless otherwise directed by your healthcare provider. 6. Follow directions from state and national officials regarding staying safe    Please feel free to call our office if any questions or concerns arise.  Warm Regards, Monia Pouch, FNP-C [facility] [address] [place], [place] [address] [phone]

## 2019-11-17 ENCOUNTER — Encounter: Payer: Self-pay | Admitting: Family Medicine

## 2019-11-17 LAB — CMP14+EGFR
ALT: 29 IU/L (ref 0–44)
AST: 21 IU/L (ref 0–40)
Albumin/Globulin Ratio: 1.9 (ref 1.2–2.2)
Albumin: 4.8 g/dL (ref 3.8–4.9)
Alkaline Phosphatase: 83 IU/L (ref 39–117)
BUN/Creatinine Ratio: 20 (ref 9–20)
BUN: 23 mg/dL (ref 6–24)
Bilirubin Total: 0.3 mg/dL (ref 0.0–1.2)
CO2: 27 mmol/L (ref 20–29)
Calcium: 10.1 mg/dL (ref 8.7–10.2)
Chloride: 101 mmol/L (ref 96–106)
Creatinine, Ser: 1.16 mg/dL (ref 0.76–1.27)
GFR calc Af Amer: 81 mL/min/{1.73_m2} (ref >59)
GFR calc non Af Amer: 70 mL/min/{1.73_m2} (ref >59)
Globulin, Total: 2.5 g/dL (ref 1.5–4.5)
Glucose: 107 mg/dL — ABNORMAL HIGH (ref 65–99)
Potassium: 3.9 mmol/L (ref 3.5–5.2)
Sodium: 140 mmol/L (ref 134–144)
Total Protein: 7.3 g/dL (ref 6.0–8.5)

## 2019-11-17 LAB — CBC WITH DIFFERENTIAL/PLATELET
Basophils Absolute: 0.1 10*3/uL (ref 0.0–0.2)
Basos: 1 %
EOS (ABSOLUTE): 0.1 10*3/uL (ref 0.0–0.4)
Eos: 2 %
Hematocrit: 47.1 % (ref 37.5–51.0)
Hemoglobin: 15.6 g/dL (ref 13.0–17.7)
Immature Grans (Abs): 0 10*3/uL (ref 0.0–0.1)
Immature Granulocytes: 1 %
Lymphocytes Absolute: 2.7 10*3/uL (ref 0.7–3.1)
Lymphs: 34 %
MCH: 28.9 pg (ref 26.6–33.0)
MCHC: 33.1 g/dL (ref 31.5–35.7)
MCV: 87 fL (ref 79–97)
Monocytes Absolute: 0.8 10*3/uL (ref 0.1–0.9)
Monocytes: 10 %
Neutrophils Absolute: 4.2 10*3/uL (ref 1.4–7.0)
Neutrophils: 52 %
Platelets: 338 10*3/uL (ref 150–450)
RBC: 5.39 x10E6/uL (ref 4.14–5.80)
RDW: 12 % (ref 11.6–15.4)
WBC: 7.9 10*3/uL (ref 3.4–10.8)

## 2019-11-17 LAB — LIPID PANEL
Chol/HDL Ratio: 2.6 ratio (ref 0.0–5.0)
Cholesterol, Total: 125 mg/dL (ref 100–199)
HDL: 48 mg/dL (ref >39)
LDL Chol Calc (NIH): 66 mg/dL (ref 0–99)
Triglycerides: 49 mg/dL (ref 0–149)
VLDL Cholesterol Cal: 11 mg/dL (ref 5–40)

## 2019-11-17 LAB — PSA, TOTAL AND FREE
PSA, Free Pct: 50 %
PSA, Free: 0.2 ng/mL
Prostate Specific Ag, Serum: 0.4 ng/mL (ref 0.0–4.0)

## 2019-11-17 LAB — THYROID PANEL WITH TSH
Free Thyroxine Index: 1.9 (ref 1.2–4.9)
T3 Uptake Ratio: 26 % (ref 24–39)
T4, Total: 7.4 ug/dL (ref 4.5–12.0)
TSH: 1.27 u[IU]/mL (ref 0.450–4.500)

## 2019-11-29 NOTE — Telephone Encounter (Signed)
Received the following message from patient:   "I wanted to reach out to you and Dr. Lake Bells to get your guidance on my taking the COVID vaccine with my medical conditions????  Thank Dennis Bast,  Willard Farquharson [phone]"  Patient last had a video visit on 07/08/19 with Aaron Edelman for F/U on OSA, dyspnea and left hemidiaphragm paralysis. He was advised to follow up in a year. He wants to know if he should receive the COVID vaccine. Based on his problem list I started to advise him yes but wanted to make sure.   Aaron Edelman, please advise. Thanks!

## 2019-11-29 NOTE — Telephone Encounter (Signed)
11/29/2019 1124  Hi Mr. Theil hope you are doing well.  Continue to stay safe.  For right now based off your obstructive sleep apnea, obesity, and left diaphragm paralysis.  I would recommend that you receive the Covid vaccine when it is available to the general public.  This will be sometime over the coming months.  We are still getting more information on this daily.  I would continue to pay attention with the news.  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  If you are please contact our office around that time and if we have different information we can always discuss at that time.  But for right now I would plan on receiving the vaccine that would be our recommendation.  As for a new provider since Dr. Lake Bells is no longer seeing patients in the outpatient setting with our team.  He is working full-time inpatient at [place].  I would recommend that we get you established with either Dr. Halford Chessman or Dr. Ander Slade.  They are both pulmonologist as well as board-certified sleep physicians.  I would recommend that you get established with one of them in about 3 months in a 30-minute office slot.  Continue to stay safe.  Thank you for your service.  Happy holidays.  Wyn Quaker FNP

## 2019-12-22 ENCOUNTER — Telehealth: Payer: Self-pay | Admitting: Pulmonary Disease

## 2019-12-22 NOTE — Telephone Encounter (Signed)
Please coordinate the patient to be scheduled with our clinical pharmacy team.  This can be over the phone they can help him with his cost of his inhalers.  It appears that [facility] may be the best option for him.  But clinical pharmacy can review the link and see what their options are.Yes he would still be a candidate and okay to receive his Covid vaccine.  This is ultimately his choice but yes I recommendation would be that he receives it.  I would recommend getting him established with Dr. [name] in a slot.   [name] FNP

## 2019-12-22 NOTE — Telephone Encounter (Signed)
Called the patient to make him aware of the response received from [name], NP. The patient has been schedule to see Dr. [name] 01/11/20 at 9:30 and the pharmacy team same day at 10:00.  Nothing further needed at this time.

## 2019-12-22 NOTE — Telephone Encounter (Addendum)
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 He wants to know with his diaphragm issues would it be ok to receive the covid vaccine.   He also needs another inhaler to use because the Symbicort is over 100 dollars and he will not be able to afford this. I looked at [facility]'s formulary and the tier one medications are the nebulizer medications. The link to the formulary is below and the inhalers are on page 62 if you wanted to review them. Arlys John please advise   Please advise on which physician you recommend to take over his care. He wants to make an appt when I call him back.   [email or web address]

## 2019-12-30 ENCOUNTER — Encounter: Payer: Self-pay | Admitting: Family Medicine

## 2019-12-30 ENCOUNTER — Other Ambulatory Visit: Payer: Self-pay

## 2019-12-30 ENCOUNTER — Ambulatory Visit (INDEPENDENT_AMBULATORY_CARE_PROVIDER_SITE_OTHER): Payer: 59 | Admitting: Family Medicine

## 2019-12-30 DIAGNOSIS — R42 Dizziness and giddiness: Secondary | ICD-10-CM

## 2019-12-30 DIAGNOSIS — H9312 Tinnitus, left ear: Secondary | ICD-10-CM | POA: Diagnosis not present

## 2019-12-30 MED ORDER — FLUTICASONE PROPIONATE 50 MCG/ACT NA SUSP: 2.0000 | Freq: Every day | NASAL | 6 refills | Status: DC

## 2019-12-30 NOTE — Progress Notes (Signed)
Virtual Visit via telephone Note Due to COVID-19 pandemic this visit was conducted virtually. This visit type was conducted due to national recommendations for restrictions regarding the COVID-19 Pandemic (e.g. social distancing, sheltering in place) in an effort to limit this patient's exposure and mitigate transmission in our community. All issues noted in this document were discussed and addressed.  A physical exam was not performed with this format.   I connected with Kyle Burns on 12/30/2019 at 1355 by telephone and verified that I am speaking with the correct person using two identifiers. Kyle Burns is currently located at home and family is currently with them during visit. The provider, Kari Baars, FNP is located in their office at time of visit.  I discussed the limitations, risks, security and privacy concerns of performing an evaluation and management service by telephone and the availability of in person appointments. I also discussed with the patient that there may be a patient responsible charge related to this service. The patient expressed understanding and agreed to proceed.  Subjective:  Patient ID: Kyle Burns, male    DOB: [birth year], 54 y.o.   MRN: [id number]  Chief Complaint:  Dizziness   HPI: Kyle Burns is a 54 y.o. male presenting on 12/30/2019 for Dizziness   Patient reports 2-3 incidents of dizziness and slight nausea. Patient states this 1st happened about 2 to 3 weeks ago and has occurred 1-2 times since then. He noticed the incidents when he has not had anything to eat or drink. States after he eats or drinks something the symptoms seem to go away. States that he has had some popping and ringing in his left ear. No ear pain or drainage. No fever, chills, or weakness. He denies any focal deficits, slurred speech, facial droop, unilateral weakness, visual changes, confusion, or syncope. He has not checked his blood pressure, heart rate, or blood  sugar during any of these episodes. He states he was taking some vitamin C, zinc and vitamin D3 for immunity support and was concerned this could have been causative. He stopped these items and the symptoms did not cease. No chest pain, shortness of breath, or palpitations. No diaphoresis or vomiting.  Dizziness This is a new problem. The current episode started 1 to 4 weeks ago. The problem occurs intermittently. Associated symptoms include nausea. Pertinent negatives include no abdominal pain, anorexia, arthralgias, change in bowel habit, chest pain, chills, congestion, coughing, diaphoresis, fatigue, fever, headaches, joint swelling, myalgias, neck pain, numbness, rash, sore throat, swollen glands, urinary symptoms, vertigo, visual change, vomiting or weakness. Nothing aggravates the symptoms. He has tried eating for the symptoms. The treatment provided significant relief.     Relevant past medical, surgical, family, and social history reviewed and updated as indicated.  Allergies and medications reviewed and updated.   Past Medical History:  Diagnosis Date  . Asthma    hx of   . Back pain   . Bilevel positive airway pressure (BPAP) dependence   . GERD (gastroesophageal reflux disease)   . HTN (hypertension)   . Hyperlipidemia     Past Surgical History:  Procedure Laterality Date  . bilateral hernia repair     total of 3   . CYST REMOVAL NECK     age - 89s    Social History   Socioeconomic History  . Marital status: Single    Spouse name: Not on file  . Number of children: 0  . Years of education: Not on file  .  Highest education level: Not on file  Occupational History  . Not on file  Tobacco Use  . Smoking status: Never Smoker  . Smokeless tobacco: Current User    Types: Chew  . Tobacco comment: Uses chew "when he has a bad day"  Substance and Sexual Activity  . Alcohol use: No    Comment: Socially  . Drug use: No  . Sexual activity: Not on file  Other Topics  Concern  . Not on file  Social History Narrative  . Not on file   Social Determinants of Health   Financial Resource Strain:   . Difficulty of Paying Living Expenses: Not on file  Food Insecurity:   . Worried About Programme researcher, broadcasting/film/video in the Last Year: Not on file  . Ran Out of Food in the Last Year: Not on file  Transportation Needs:   . Lack of Transportation (Medical): Not on file  . Lack of Transportation (Non-Medical): Not on file  Physical Activity:   . Days of Exercise per Week: Not on file  . Minutes of Exercise per Session: Not on file  Stress:   . Feeling of Stress : Not on file  Social Connections:   . Frequency of Communication with Friends and Family: Not on file  . Frequency of Social Gatherings with Friends and Family: Not on file  . Attends Religious Services: Not on file  . Active Member of Clubs or Organizations: Not on file  . Attends Banker Meetings: Not on file  . Marital Status: Not on file  Intimate Partner Violence:   . Fear of Current or Ex-Partner: Not on file  . Emotionally Abused: Not on file  . Physically Abused: Not on file  . Sexually Abused: Not on file    Outpatient Encounter Medications as of 12/30/2019  Medication Sig  . albuterol (VENTOLIN HFA) 108 (90 Base) MCG/ACT inhaler USE 2 PUFFS EVERY 6 HOURS AS NEEDED FOR WHEEZING  . aspirin 81 MG EC tablet Take 162 mg by mouth daily.   Marland Kitchen atorvastatin (LIPITOR) 10 MG tablet Take 1 tablet (10 mg total) by mouth daily.  . budesonide-formoterol (SYMBICORT) 80-4.5 MCG/ACT inhaler Inhale 2 puffs into the lungs 2 (two) times daily.  . fluticasone (FLONASE) 50 MCG/ACT nasal spray Place 2 sprays into both nostrils daily.  . furosemide (LASIX) 20 MG tablet Take 1 tablet (20 mg total) by mouth daily.  Marland Kitchen losartan-hydrochlorothiazide (HYZAAR) 100-25 MG tablet Take 1 tablet by mouth daily.  . meloxicam (MOBIC) 15 MG tablet Take 1 tablet (15 mg total) by mouth daily.  Marland Kitchen Spacer/Aero-Holding  Chambers (AEROCHAMBER MV) inhaler Use as instructed  . spironolactone (ALDACTONE) 25 MG tablet Take 1 tablet (25 mg total) by mouth daily.   No facility-administered encounter medications on file as of 12/30/2019.    No Known Allergies  Review of Systems  Constitutional: Negative for activity change, appetite change, chills, diaphoresis, fatigue, fever and unexpected weight change.  HENT: Positive for tinnitus. Negative for congestion, ear discharge, ear pain and sore throat.   Eyes: Negative.  Negative for photophobia and visual disturbance.  Respiratory: Negative for cough, chest tightness and shortness of breath.   Cardiovascular: Negative for chest pain, palpitations and leg swelling.  Gastrointestinal: Positive for nausea. Negative for abdominal pain, anorexia, blood in stool, change in bowel habit, constipation, diarrhea and vomiting.  Endocrine: Negative.   Genitourinary: Negative for decreased urine volume, difficulty urinating, dysuria, frequency and urgency.  Musculoskeletal: Negative for arthralgias, gait  problem, joint swelling, myalgias and neck pain.  Skin: Negative.  Negative for color change, pallor and rash.  Allergic/Immunologic: Negative.   Neurological: Positive for dizziness. Negative for vertigo, tremors, seizures, syncope, facial asymmetry, speech difficulty, weakness, light-headedness, numbness and headaches.  Hematological: Negative.   Psychiatric/Behavioral: Negative for confusion, hallucinations, sleep disturbance and suicidal ideas.  All other systems reviewed and are negative.        Observations/Objective: No vital signs or physical exam, this was a telephone or virtual health encounter.  Pt alert and oriented, answers all questions appropriately, and able to speak in full sentences.    Assessment and Plan: Laron was seen today for dizziness.  Diagnoses and all orders for this visit:  Episode of dizziness Tinnitus of left ear Patient reports  intermittent episodes of dizziness. These episodes seem to occur when patient has not had anything to eat. The dizziness does resolve after eating. Episodes of dizziness could be associated with low blood pressure, low heart rate, or low blood sugar. Patient advised to keep a log of episodes and surrounding circumstances. Patient aware to try to check blood pressure, heart rate, and blood sugar during the episodes. Patient aware of symptoms that require emergent evaluation and treatment. No red flags today concerning for acute neurovascular event. Patient aware of these symptoms and to go the emergency room if they occur. Patient does report intermittent tendinitis of the left ear, symptoms could be due to otitis media with effusion. Symptomatic care discussed. Will initiate Flonase. Patient to follow-up in office in about 2 to 3 weeks for reevaluation and lab work. -     fluticasone (FLONASE) 50 MCG/ACT nasal spray; Place 2 sprays into both nostrils daily.     Follow Up Instructions: Return in about 2 weeks (around 01/13/2020), or if symptoms worsen or fail to improve, for dizziness.    I discussed the assessment and treatment plan with the patient. The patient was provided an opportunity to ask questions and all were answered. The patient agreed with the plan and demonstrated an understanding of the instructions.   The patient was advised to call back or seek an in-person evaluation if the symptoms worsen or if the condition fails to improve as anticipated.  The above assessment and management plan was discussed with the patient. The patient verbalized understanding of and has agreed to the management plan. Patient is aware to call the clinic if they develop any new symptoms or if symptoms persist or worsen. Patient is aware when to return to the clinic for a follow-up visit. Patient educated on when it is appropriate to go to the emergency department.    I provided 25 minutes of non-face-to-face  time during this encounter. The call started at 1355. The call ended at 1425. The other time was used for coordination of care.    Monia Pouch, FNP-C [facility] [address] [place], [place] [address] [phone] 12/30/2019

## 2020-01-03 ENCOUNTER — Encounter: Payer: Self-pay | Admitting: Family Medicine

## 2020-01-03 ENCOUNTER — Other Ambulatory Visit: Payer: Self-pay

## 2020-01-03 ENCOUNTER — Ambulatory Visit: Payer: 59 | Admitting: Family Medicine

## 2020-01-03 VITALS — BP 125/67 | HR 104 | Temp 98.0°F | Resp 20 | Ht 71.0 in | Wt 289.0 lb

## 2020-01-03 DIAGNOSIS — R42 Dizziness and giddiness: Secondary | ICD-10-CM | POA: Diagnosis not present

## 2020-01-03 DIAGNOSIS — R000 Tachycardia, unspecified: Secondary | ICD-10-CM

## 2020-01-03 NOTE — Patient Instructions (Signed)

## 2020-01-03 NOTE — Progress Notes (Signed)
Subjective:  Patient ID: Kyle Burns, male    DOB: 1966-05-06, 54 y.o.   MRN: [id number]  Patient Care Team: Baruch Gouty, FNP as PCP - General (Family Medicine) Verl Blalock, Marijo Conception, MD (Inactive) as Attending Physician (Cardiology)   Chief Complaint:  Dizziness and increased heart rate at times   HPI: Kyle Burns is a 54 y.o. male presenting on 01/03/2020 for Dizziness and increased heart rate at times   Pt presents today for ongoing intermittent dizziness with palpitations. States he has had three episodes in the last 5 days. States he notices this when he is getting worked up or nervous about something going on at work. 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 He states the last incident occurred when one of his officers had a car pulled over and would not answer his radio after reporting several individuals approaching him during the stop. States he had a brief episode of palpitations and dizziness that subsided when the officer responded. States he was able to calm himself down and all of the symptoms subsided. He denies other associated symptoms.     Relevant past medical, surgical, family, and social history reviewed and updated as indicated.  Allergies and medications reviewed and updated. Date reviewed: Chart in Epic.   Past Medical History:  Diagnosis Date  . Asthma    hx of   . Back pain   . Bilevel positive airway pressure (BPAP) dependence   . GERD (gastroesophageal reflux disease)   . HTN (hypertension)   . Hyperlipidemia     Past Surgical History:  Procedure Laterality Date  . bilateral hernia repair     total of 3   . CYST REMOVAL NECK     age - 32s    Social History   Socioeconomic History  . Marital status: Single    Spouse name: Not on file  . Number of children: 0  . Years of education: Not on file  . Highest education level: Not on file  Occupational History  .  Not on file  Tobacco Use  . Smoking status: Never Smoker  . Smokeless tobacco: Current User    Types: Chew  . Tobacco comment: Uses chew "when he has a bad day"  Substance and Sexual Activity  . Alcohol use: No    Comment: Socially  . Drug use: No  . Sexual activity: Not on file  Other Topics Concern  . Not on file  Social History Narrative  . Not on file   Social Determinants of Health   Financial Resource Strain:   . Difficulty of Paying Living Expenses: Not on file  Food Insecurity:   . Worried About Charity fundraiser in the Last Year: Not on file  . Ran Out of Food in the Last Year: Not on file  Transportation Needs:   . Lack of Transportation (Medical): Not on file  . Lack of Transportation (Non-Medical): Not on file  Physical Activity:   . Days of Exercise per Week: Not on file  . Minutes of Exercise per Session: Not on file  Stress:   . Feeling of Stress : Not on file  Social Connections:   . Frequency of Communication with Friends and Family: Not on file  . Frequency of Social Gatherings with Friends and Family: Not on file  . Attends Religious Services: Not on file  . Active Member  of Clubs or Organizations: Not on file  . Attends Archivist Meetings: Not on file  . Marital Status: Not on file  Intimate Partner Violence:   . Fear of Current or Ex-Partner: Not on file  . Emotionally Abused: Not on file  . Physically Abused: Not on file  . Sexually Abused: Not on file    Outpatient Encounter Medications as of 01/03/2020  Medication Sig  . albuterol (VENTOLIN HFA) 108 (90 Base) MCG/ACT inhaler USE 2 PUFFS EVERY 6 HOURS AS NEEDED FOR WHEEZING  . aspirin 81 MG EC tablet Take 162 mg by mouth daily.   Marland Kitchen atorvastatin (LIPITOR) 10 MG tablet Take 1 tablet (10 mg total) by mouth daily.  . budesonide-formoterol (SYMBICORT) 80-4.5 MCG/ACT inhaler Inhale 2 puffs into the lungs 2 (two) times daily.  . fluticasone (FLONASE) 50 MCG/ACT nasal spray Place 2 sprays  into both nostrils daily.  . furosemide (LASIX) 20 MG tablet Take 1 tablet (20 mg total) by mouth daily.  Marland Kitchen losartan-hydrochlorothiazide (HYZAAR) 100-25 MG tablet Take 1 tablet by mouth daily.  . meloxicam (MOBIC) 15 MG tablet Take 1 tablet (15 mg total) by mouth daily.  Marland Kitchen OVER THE COUNTER MEDICATION VIT C , VIT D, Zinc, MVI 50 plus - all one a day  . Spacer/Aero-Holding Chambers (AEROCHAMBER MV) inhaler Use as instructed  . spironolactone (ALDACTONE) 25 MG tablet Take 1 tablet (25 mg total) by mouth daily.   No facility-administered encounter medications on file as of 01/03/2020.    No Known Allergies  Review of Systems  Constitutional: Negative for activity change, appetite change, chills, diaphoresis, fatigue, fever and unexpected weight change.  HENT: Negative.   Eyes: Negative.  Negative for photophobia and visual disturbance.  Respiratory: Negative for cough, chest tightness and shortness of breath.   Cardiovascular: Positive for palpitations. Negative for chest pain and leg swelling.  Gastrointestinal: Negative for blood in stool, constipation, diarrhea, nausea and vomiting.  Endocrine: Negative.   Genitourinary: Negative for decreased urine volume, difficulty urinating, dysuria, frequency and urgency.  Musculoskeletal: Negative for arthralgias and myalgias.  Skin: Negative.   Allergic/Immunologic: Negative.   Neurological: Positive for dizziness. Negative for tremors, seizures, syncope, facial asymmetry, speech difficulty, weakness, light-headedness, numbness and headaches.  Hematological: Negative.   Psychiatric/Behavioral: Negative for confusion, hallucinations, sleep disturbance and suicidal ideas. The patient is nervous/anxious. The patient is not hyperactive.   All other systems reviewed and are negative.       Objective:  BP 125/67   Pulse (!) 104   Temp 98 F (36.7 C)   Resp 20   Ht '5\' 11"'$  (1.803 m)   Wt 289 lb (131.1 kg)   SpO2 96%   BMI 40.31 kg/m    Wt  Readings from Last 3 Encounters:  01/03/20 289 lb (131.1 kg)  11/16/19 293 lb (132.9 kg)  06/04/18 290 lb (131.5 kg)    Physical Exam Vitals and nursing note reviewed.  Constitutional:      General: He is not in acute distress.    Appearance: Normal appearance. He is well-developed and well-groomed. He is not ill-appearing, toxic-appearing or diaphoretic.  HENT:     Head: Normocephalic and atraumatic.     Jaw: There is normal jaw occlusion.     Right Ear: Hearing normal.     Left Ear: Hearing normal.     Nose: Nose normal.     Mouth/Throat:     Lips: Pink.     Mouth: Mucous membranes are moist.  Pharynx: Oropharynx is clear. Uvula midline.  Eyes:     General: Lids are normal.     Extraocular Movements: Extraocular movements intact.     Conjunctiva/sclera: Conjunctivae normal.     Pupils: Pupils are equal, round, and reactive to light.  Neck:     Thyroid: No thyroid mass, thyromegaly or thyroid tenderness.     Vascular: No carotid bruit or JVD.     Trachea: Trachea and phonation normal.  Cardiovascular:     Rate and Rhythm: Normal rate and regular rhythm.     Chest Wall: PMI is not displaced.     Pulses: Normal pulses.     Heart sounds: Normal heart sounds. No murmur. No friction rub. No gallop.   Pulmonary:     Effort: Pulmonary effort is normal. No respiratory distress.     Breath sounds: Normal breath sounds. No wheezing.  Abdominal:     General: Bowel sounds are normal. There is no distension or abdominal bruit.     Palpations: Abdomen is soft. There is no hepatomegaly or splenomegaly.     Tenderness: There is no abdominal tenderness. There is no right CVA tenderness or left CVA tenderness.     Hernia: No hernia is present.  Musculoskeletal:        General: Normal range of motion.     Cervical back: Normal range of motion and neck supple.     Right lower leg: No edema.     Left lower leg: No edema.  Lymphadenopathy:     Cervical: No cervical adenopathy.   Skin:    General: Skin is warm and dry.     Capillary Refill: Capillary refill takes less than 2 seconds.     Coloration: Skin is not cyanotic, jaundiced or pale.     Findings: No rash.  Neurological:     General: No focal deficit present.     Mental Status: He is alert and oriented to person, place, and time.     Cranial Nerves: Cranial nerves are intact. No cranial nerve deficit.     Sensory: Sensation is intact. No sensory deficit.     Motor: Motor function is intact. No weakness.     Coordination: Coordination is intact. Coordination normal.     Gait: Gait is intact. Gait normal.     Deep Tendon Reflexes: Reflexes are normal and symmetric. Reflexes normal.  Psychiatric:        Attention and Perception: Attention and perception normal.        Mood and Affect: Mood and affect normal.        Speech: Speech normal.        Behavior: Behavior normal. Behavior is cooperative.        Thought Content: Thought content normal.        Cognition and Memory: Cognition and memory normal.        Judgment: Judgment normal.     Results for orders placed or performed in visit on 11/16/19  CBC with Differential/Platelet  Result Value Ref Range   WBC 7.9 3.4 - 10.8 x10E3/uL   RBC 5.39 4.14 - 5.80 x10E6/uL   Hemoglobin 15.6 13.0 - 17.7 g/dL   Hematocrit 47.1 37.5 - 51.0 %   MCV 87 79 - 97 fL   MCH 28.9 26.6 - 33.0 pg   MCHC 33.1 31.5 - 35.7 g/dL   RDW 12.0 11.6 - 15.4 %   Platelets 338 150 - 450 x10E3/uL   Neutrophils 52 Not Estab. %  Lymphs 34 Not Estab. %   Monocytes 10 Not Estab. %   Eos 2 Not Estab. %   Basos 1 Not Estab. %   Neutrophils Absolute 4.2 1.4 - 7.0 x10E3/uL   Lymphocytes Absolute 2.7 0.7 - 3.1 x10E3/uL   Monocytes Absolute 0.8 0.1 - 0.9 x10E3/uL   EOS (ABSOLUTE) 0.1 0.0 - 0.4 x10E3/uL   Basophils Absolute 0.1 0.0 - 0.2 x10E3/uL   Immature Granulocytes 1 Not Estab. %   Immature Grans (Abs) 0.0 0.0 - 0.1 x10E3/uL  CMP14+EGFR  Result Value Ref Range   Glucose 107 (H)  65 - 99 mg/dL   BUN 23 6 - 24 mg/dL   Creatinine, Ser 1.16 0.76 - 1.27 mg/dL   GFR calc non Af Amer 70 >59 mL/min/1.73   GFR calc Af Amer 81 >59 mL/min/1.73   BUN/Creatinine Ratio 20 9 - 20   Sodium 140 134 - 144 mmol/L   Potassium 3.9 3.5 - 5.2 mmol/L   Chloride 101 96 - 106 mmol/L   CO2 27 20 - 29 mmol/L   Calcium 10.1 8.7 - 10.2 mg/dL   Total Protein 7.3 6.0 - 8.5 g/dL   Albumin 4.8 3.8 - 4.9 g/dL   Globulin, Total 2.5 1.5 - 4.5 g/dL   Albumin/Globulin Ratio 1.9 1.2 - 2.2   Bilirubin Total 0.3 0.0 - 1.2 mg/dL   Alkaline Phosphatase 83 39 - 117 IU/L   AST 21 0 - 40 IU/L   ALT 29 0 - 44 IU/L  Lipid panel  Result Value Ref Range   Cholesterol, Total 125 100 - 199 mg/dL   Triglycerides 49 0 - 149 mg/dL   HDL 48 >39 mg/dL   VLDL Cholesterol Cal 11 5 - 40 mg/dL   LDL Chol Calc (NIH) 66 0 - 99 mg/dL   Chol/HDL Ratio 2.6 0.0 - 5.0 ratio  Thyroid Panel With TSH  Result Value Ref Range   TSH 1.270 0.450 - 4.500 uIU/mL   T4, Total 7.4 4.5 - 12.0 ug/dL   T3 Uptake Ratio 26 24 - 39 %   Free Thyroxine Index 1.9 1.2 - 4.9  PSA, total and free  Result Value Ref Range   Prostate Specific Ag, Serum 0.4 0.0 - 4.0 ng/mL   PSA, Free 0.20 N/A ng/mL   PSA, Free Pct 50.0 %  [facility] HEPATITIS C SCREENING LAB  Result Value Ref Range   [facility] Hepatitis Screen Negative-Validated      EKG: RBBB without ectopy or acute ST changes. No changes from previous EKG. Monia Pouch, FNP-C  Pertinent labs & imaging results that were available during my care of the patient were reviewed by me and considered in my medical decision making.  Assessment & Plan:  Erven was seen today for dizziness and increased heart rate at times.  Diagnoses and all orders for this visit:  Increased heart rate Reported symptoms consistent with anxiety response. No associated chest pain. EKG without acute changes. Discussed symptomatic and stress control. If symptoms persist, will refer to cardiology for holter monitor and  further evaluation. Will check TSH today.  -     EKG 12-Lead -     Thyroid Panel With TSH  Dizziness Reported symptoms consistent with anxiety response. Pt declines medications at this time. Will like to trial symptomatic and stress management. Pt aware of symptoms that require emergent evaluation and treatment. No red flags concerning for acute cardiovascular or neurovascular events. Labs pending. Follow up in 6 weeks or sooner if symptoms  persist.   -     Thyroid Panel With TSH     Continue all other maintenance medications.  Follow up plan: Return in 6 weeks (on 02/14/2020), or if symptoms worsen or fail to improve.  Continue healthy lifestyle choices, including diet (rich in fruits, vegetables, and lean proteins, and low in salt and simple carbohydrates) and exercise (at least 30 minutes of moderate physical activity daily).  Educational handout given for GAD  The above assessment and management plan was discussed with the patient. The patient verbalized understanding of and has agreed to the management plan. Patient is aware to call the clinic if they develop any new symptoms or if symptoms persist or worsen. Patient is aware when to return to the clinic for a follow-up visit. Patient educated on when it is appropriate to go to the emergency department.   Monia Pouch, FNP-C [facility] [phone]

## 2020-01-04 LAB — THYROID PANEL WITH TSH
Free Thyroxine Index: 1.8 (ref 1.2–4.9)
T3 Uptake Ratio: 25 % (ref 24–39)
T4, Total: 7.3 ug/dL (ref 4.5–12.0)
TSH: 0.662 u[IU]/mL (ref 0.450–4.500)

## 2020-01-09 NOTE — Progress Notes (Signed)
**  Patient seen by the pharmacy team in conjunction with [name], FNP.  Please refer to office visit encounter on 01/11/2020 for provider and pharmacy assessment and plan.Verlin Fester, PharmD, [place], CPP Clinical Specialty Pharmacist [phone]  01/11/2020 9:45 AM

## 2020-01-10 NOTE — Progress Notes (Signed)
@Patient  ID: Kyle Burns, male    DOB: 1966/08/19, 54 y.o.   MRN: [id number]  Chief Complaint  Patient presents with  . Follow-up    F/U on OSA.     Referring provider: Baruch Gouty, FNP  HPI:  54 year old male followed in our office for moderate obstructive sleep apnea, 2015 chest x-ray demonstrated left hemo-diaphragm elevation  Past medical history: Obesity, GERD, high blood pressure, hyperlipidemia Smoking history: Never smoker Maintenance: Symbicort 80 Patient of Dr. Lake Bells  01/11/2020  - Visit   54 year old male never smoker followed in our office for obstructive sleep apnea and suspected asthma.  Patient reports has been doing well since last being seen virtually.  He has followed up with primary care for some intermittent dizzy spells where he was diagnosed with eustachian tube dysfunction.  He is now started Zyrtec as well as Flonase.  He reports that his symptoms have improved.  He continues to use his BiPAP.  BiPAP compliance report confirms this:  12/11/2019-01/09/2020-29 of the last 30 days greater than 4 hours, average usage 7 hours and 11 minutes, IPAP 17, EPAP 13, AHI 0.3  Patient is also planning on working with pharmacy team today regarding cost of inhalers.  He is also interested in discussing the COVID-19 vaccine as this will be available to him through his work.  Tests:   06/04/2018-pulmonary function test- FVC 2.41 (56% predicted), postbronchodilator ratio 89, postbronchodilator FEV1 2.11 (62% predicted), no bronchodilator response, DLCO 62  Polysomnogram showed an AHI of 29. CPAP was titrated to 18 cm of water. 05/12/2018- CPAP titration study transitioned to BiPAP-and optimal BiPAP pressure was selected for this patient 17/13 cm of water  Imaging:  04/21/2018-chest x-ray- chronic elevation of left hemidiaphragm, no acute findings 09/18/2014-CT chest without contrast- no evidence of thoracic lymphedema or mediastinal mass lesion, elevation of left  hemidiaphragm, 3 mm right upper lobe pulmonary nodule  Cardiac:  04/23/2018-echocardiogram- LV ejection fraction 60 to 65%, mild LVH  Labs:  04/21/2018-BNP-13  FENO:  No results found for: NITRICOXIDE  PFT: PFT Results Latest Ref Rng & Units 06/04/2018 04/06/2015 09/18/2014  FVC-Pre L 2.41 2.13 2.31  FVC-Predicted Pre % 56 49 53  FVC-Post L 2.38 2.14 2.20  FVC-Predicted Post % 55 49 50  Pre FEV1/FVC % % 87 86 87  Post FEV1/FCV % % 89 87 89  FEV1-Pre L 2.11 1.83 2.01  FEV1-Predicted Pre % 62 52 57  FEV1-Post L 2.11 1.85 1.96  DLCO UNC% % 62 58 65  DLCO COR %Predicted % 118 111 132  TLC L 4.89 3.86 4.85  TLC % Predicted % 68 53 67  RV % Predicted % 78 58 104    WALK:  SIX MIN WALK 06/14/2014  Supplimental Oxygen during Test? (L/min) No    Imaging: No results found.  Lab Results:  CBC    Component Value Date/Time   WBC 7.9 11/16/2019 1040   RBC 5.39 11/16/2019 1040   HGB 15.6 11/16/2019 1040   HCT 47.1 11/16/2019 1040   PLT 338 11/16/2019 1040   MCV 87 11/16/2019 1040   MCH 28.9 11/16/2019 1040   MCHC 33.1 11/16/2019 1040   RDW 12.0 11/16/2019 1040   LYMPHSABS 2.7 11/16/2019 1040   EOSABS 0.1 11/16/2019 1040   BASOSABS 0.1 11/16/2019 1040    BMET    Component Value Date/Time   NA 140 11/16/2019 1040   K 3.9 11/16/2019 1040   CL 101 11/16/2019 1040   CO2 27 11/16/2019  1040   GLUCOSE 107 (H) 11/16/2019 1040   BUN 23 11/16/2019 1040   CREATININE 1.16 11/16/2019 1040   CALCIUM 10.1 11/16/2019 1040   GFRNONAA 70 11/16/2019 1040   GFRAA 81 11/16/2019 1040    BNP No results found for: BNP  ProBNP    Component Value Date/Time   PROBNP 13.0 04/21/2018 1643    Specialty Problems      Pulmonary Problems   Left hemidiaphragm paralysis    No history of trauma 06/2013 sniff test> left hemidiaphragm paralysis 09/2014 PFT> no obstruction, TLC 4.85L (67% pred), DLCO 65% pred 09/2014 CT chest > no mediastinal mass or lymphadenopathy, 66mm RUL nodule April 2016  PFT> ratio normal, FEV1 1.85 L (53% predicted, no change of bronchial dilator), FVC 2.14 L (49% predicted), total lung capacity 3.86 L) 53% predicted), DLCO 19.79 (58% Pred)       Chronic obstructive airway disease with asthma (HCC)   Obstructive sleep apnea    May 2016 polysomnogram AHI 29, CPAP titrated to 18  cm of water, O2 saturation 89% on CPAP. 06/2015 ONO on CPAP < 89% 27 min 05/2016 Download> 89% usage, average time is 4 hours and 30 minutes         No Known Allergies  Immunization History  Administered Date(s) Administered  . Influenza Split 10/15/2013, 09/14/2014, 08/30/2015  . Influenza, Quadrivalent, Recombinant, Inj, Pf 09/27/2019  . Influenza-Unspecified 10/14/2017  . Pneumococcal Polysaccharide-23 12/15/2010  . Tdap 03/20/2016    Past Medical History:  Diagnosis Date  . Asthma    hx of   . Back pain   . Bilevel positive airway pressure (BPAP) dependence   . GERD (gastroesophageal reflux disease)   . HTN (hypertension)   . Hyperlipidemia     Tobacco History: Social History   Tobacco Use  Smoking Status Never Smoker  Smokeless Tobacco Current User  . Types: Chew  Tobacco Comment   Uses chew "when he has a bad day"   Ready to quit: Yes Counseling given: Yes Comment: Uses chew "when he has a bad day"   Continue to not smoke  Outpatient Encounter Medications as of 01/11/2020  Medication Sig  . albuterol (VENTOLIN HFA) 108 (90 Base) MCG/ACT inhaler USE 2 PUFFS EVERY 6 HOURS AS NEEDED FOR WHEEZING  . aspirin 81 MG EC tablet Take 162 mg by mouth daily.   Marland Kitchen atorvastatin (LIPITOR) 10 MG tablet Take 1 tablet (10 mg total) by mouth daily.  . budesonide-formoterol (SYMBICORT) 80-4.5 MCG/ACT inhaler Inhale 2 puffs into the lungs 2 (two) times daily.  . fluticasone (FLONASE) 50 MCG/ACT nasal spray Place 2 sprays into both nostrils daily.  . furosemide (LASIX) 20 MG tablet Take 1 tablet (20 mg total) by mouth daily.  Marland Kitchen losartan-hydrochlorothiazide (HYZAAR)  100-25 MG tablet Take 1 tablet by mouth daily.  . meloxicam (MOBIC) 15 MG tablet Take 1 tablet (15 mg total) by mouth daily.  Marland Kitchen OVER THE COUNTER MEDICATION VIT C , VIT D, Zinc, MVI 50 plus - all one a day  . Spacer/Aero-Holding Chambers (AEROCHAMBER MV) inhaler Use as instructed  . spironolactone (ALDACTONE) 25 MG tablet Take 1 tablet (25 mg total) by mouth daily.   No facility-administered encounter medications on file as of 01/11/2020.     Review of Systems  Review of Systems  Constitutional: Negative for activity change, chills, fatigue, fever and unexpected weight change.  HENT: Positive for congestion and postnasal drip. Negative for rhinorrhea, sinus pressure, sinus pain and sore throat.  Eyes: Negative.   Respiratory: Negative for cough, shortness of breath and wheezing.   Cardiovascular: Negative for chest pain and palpitations.  Gastrointestinal: Negative for constipation, diarrhea, nausea and vomiting.  Endocrine: Negative.   Genitourinary: Negative.   Musculoskeletal: Negative.   Skin: Negative.   Neurological: Negative for dizziness and headaches.  Psychiatric/Behavioral: Negative for dysphoric mood. The patient is nervous/anxious (working with PCP on this ).   All other systems reviewed and are negative.    Physical Exam  BP 118/80   Pulse 89   Temp 98 F (36.7 C) (Temporal)   Ht 5\' 11"  (1.803 m)   Wt 290 lb (131.5 kg)   SpO2 98%   BMI 40.45 kg/m   Wt Readings from Last 5 Encounters:  01/11/20 290 lb (131.5 kg)  01/03/20 289 lb (131.1 kg)  11/16/19 293 lb (132.9 kg)  06/04/18 290 lb (131.5 kg)  04/21/18 288 lb 3.2 oz (130.7 kg)    BMI Readings from Last 5 Encounters:  01/11/20 40.45 kg/m  01/03/20 40.31 kg/m  11/16/19 40.87 kg/m  06/04/18 40.45 kg/m  04/21/18 40.20 kg/m     Physical Exam Vitals and nursing note reviewed.  Constitutional:      General: He is not in acute distress.    Appearance: Normal appearance. He is obese.  HENT:      Head: Normocephalic and atraumatic.     Right Ear: Hearing, tympanic membrane, ear canal and external ear normal. There is no impacted cerumen.     Left Ear: Hearing, tympanic membrane, ear canal and external ear normal. There is no impacted cerumen.     Nose: Nose normal. No mucosal edema or rhinorrhea.     Right Turbinates: Not enlarged.     Left Turbinates: Not enlarged.     Mouth/Throat:     Mouth: Mucous membranes are dry.     Pharynx: Oropharynx is clear. No oropharyngeal exudate.  Eyes:     Pupils: Pupils are equal, round, and reactive to light.  Cardiovascular:     Rate and Rhythm: Normal rate and regular rhythm.     Pulses: Normal pulses.     Heart sounds: Normal heart sounds. No murmur.  Pulmonary:     Effort: Pulmonary effort is normal.     Breath sounds: Normal breath sounds. No decreased breath sounds, wheezing or rales.  Musculoskeletal:     Cervical back: Normal range of motion.     Right lower leg: No edema.     Left lower leg: No edema.  Lymphadenopathy:     Cervical: No cervical adenopathy.  Skin:    General: Skin is warm and dry.     Capillary Refill: Capillary refill takes less than 2 seconds.     Findings: No erythema or rash.  Neurological:     General: No focal deficit present.     Mental Status: He is alert and oriented to person, place, and time.     Motor: No weakness.     Coordination: Coordination normal.     Gait: Gait is intact. Gait normal.  Psychiatric:        Mood and Affect: Mood normal.        Behavior: Behavior normal. Behavior is cooperative.        Thought Content: Thought content normal.        Judgment: Judgment normal.       Assessment & Plan:   Obstructive sleep apnea Plan: Continue BiPAP  Chronic obstructive airway disease with asthma  Plan: Work with clinical pharmacy team today to see which inhalers may be more affordable May not be unreasonable consider as needed ICS/LABA use or trialing off of inhalers completely   Left hemidiaphragm paralysis Plan: Continue BiPAP  Medication management Plan: Complete medication reconciliation with clinical pharmacy team today Work with clinical pharmacy team today and inspiratory flows for inhaler education Work with clinical pharmacy team today to see which inhalers may be most affordable Okay to give Pneumovax 23 if patient would like  Healthcare maintenance Plan:  Okay to receive Pneumovax 23 today with pharmacy team if patient would like  Patient be great candidate for Covid vaccine if and when it is available  Eustachian tube dysfunction Plan: Continue Flonase Continue Zyrtec     Return in about 6 months (around 07/10/2020), or if symptoms worsen or fail to improve, for Follow up with Dr. [name]- former BQ pt.   Coral Ceo, NP 01/11/2020   This appointment required 50 minutes of patient care (this includes precharting, chart review, review of results, face-to-face care, pharmacy team appt etc.).

## 2020-01-11 ENCOUNTER — Ambulatory Visit: Payer: 59 | Admitting: Pulmonary Disease

## 2020-01-11 ENCOUNTER — Other Ambulatory Visit: Payer: Self-pay

## 2020-01-11 ENCOUNTER — Encounter: Payer: Self-pay | Admitting: Pulmonary Disease

## 2020-01-11 ENCOUNTER — Ambulatory Visit (INDEPENDENT_AMBULATORY_CARE_PROVIDER_SITE_OTHER): Payer: 59 | Admitting: Pharmacist

## 2020-01-11 VITALS — BP 118/80 | HR 89 | Temp 98.0°F | Ht 71.0 in | Wt 290.0 lb

## 2020-01-11 DIAGNOSIS — J449 Chronic obstructive pulmonary disease, unspecified: Secondary | ICD-10-CM | POA: Diagnosis not present

## 2020-01-11 DIAGNOSIS — J986 Disorders of diaphragm: Secondary | ICD-10-CM

## 2020-01-11 DIAGNOSIS — H6983 Other specified disorders of Eustachian tube, bilateral: Secondary | ICD-10-CM

## 2020-01-11 DIAGNOSIS — G4733 Obstructive sleep apnea (adult) (pediatric): Secondary | ICD-10-CM

## 2020-01-11 DIAGNOSIS — Z79899 Other long term (current) drug therapy: Secondary | ICD-10-CM

## 2020-01-11 DIAGNOSIS — H6980 Other specified disorders of Eustachian tube, unspecified ear: Secondary | ICD-10-CM | POA: Insufficient documentation

## 2020-01-11 DIAGNOSIS — Z6841 Body Mass Index (BMI) 40.0 and over, adult: Secondary | ICD-10-CM

## 2020-01-11 DIAGNOSIS — Z0000 Encounter for general adult medical examination without abnormal findings: Secondary | ICD-10-CM | POA: Insufficient documentation

## 2020-01-11 NOTE — Assessment & Plan Note (Signed)
Plan: Continue BiPAP 

## 2020-01-11 NOTE — Assessment & Plan Note (Signed)
Plan: Continue Flonase Continue Zyrtec

## 2020-01-11 NOTE — Progress Notes (Signed)
   Subjective Patient presents today to [facility] to see pharmacy team for inhaler optimization.  He was referred by [name], FNP and last visit was virtual in July 2020. Pertinent medical history includes COPD, OSA, GERD, HTN, hyperlipidemia and obesity.  Respiratory medications Current: Symbicort, flonase, albuterol rescue inhaler Patient reports no known adherence challenges  Objective  Outpatient Encounter Medications as of 01/11/2020  Medication Sig  . albuterol (VENTOLIN HFA) 108 (90 Base) MCG/ACT inhaler USE 2 PUFFS EVERY 6 HOURS AS NEEDED FOR WHEEZING  . aspirin 81 MG EC tablet Take 162 mg by mouth daily.   Marland Kitchen atorvastatin (LIPITOR) 10 MG tablet Take 1 tablet (10 mg total) by mouth daily.  . budesonide-formoterol (SYMBICORT) 80-4.5 MCG/ACT inhaler Inhale 2 puffs into the lungs 2 (two) times daily.  . fluticasone (FLONASE) 50 MCG/ACT nasal spray Place 2 sprays into both nostrils daily.  . furosemide (LASIX) 20 MG tablet Take 1 tablet (20 mg total) by mouth daily.  Marland Kitchen losartan-hydrochlorothiazide (HYZAAR) 100-25 MG tablet Take 1 tablet by mouth daily.  . meloxicam (MOBIC) 15 MG tablet Take 1 tablet (15 mg total) by mouth daily.  Marland Kitchen OVER THE COUNTER MEDICATION VIT C , VIT D, Zinc, MVI 50 plus - all one a day  . Spacer/Aero-Holding Chambers (AEROCHAMBER MV) inhaler Use as instructed  . spironolactone (ALDACTONE) 25 MG tablet Take 1 tablet (25 mg total) by mouth daily.   No facility-administered encounter medications on file as of 01/11/2020.   Immunization History  Administered Date(s) Administered  . Influenza Split 10/15/2013, 09/14/2014, 08/30/2015  . Influenza, Quadrivalent, Recombinant, Inj, Pf 09/27/2019  . Influenza-Unspecified 10/14/2017  . Pneumococcal Polysaccharide-23 12/15/2010  . Tdap 03/20/2016   Assessment and Plan  1. Inhaler Optimization  Patient is currently on Symbicort 2 puffs twice a day.  His copay is over $100.  Elwin Sleight requires a PA but has a  co-pay card available. All other ICS/LABA combination inhalers had similar co-pays.  Also reviewed ICS and LABA non-combination inhalers and each preferred inhaler in the class had a $60 co-pay.  Patient would like to continue on Symbicort.  His PFT's do not show true obstruction but does have restriction due to weight. Questionable how much benefit he is getting from his inhaler.  Discussed a trial off Symbicort and to use as needed.  Patient is agreeable.  He also plans to try and lose weight.  Instructed patient to reach out if his symptoms worsen or would like to do a trial of another inhaler.  Patient verbalized understanding.  2. Medication Reconciliation  A drug regimen assessment was performed, including review of allergies, interactions, disease-state management, dosing and immunization history. Medications were reviewed with the patient, including name, instructions, indication, goals of therapy, potential side effects, importance of adherence, and safe use.    Identified drug interaction between lasix, Mobic, and HyZaar increasing risk of nephrotoxicity.  Discussed with patient and reviewed indication for each medication.  He is taking Mobic for back pain but doesn't feel it is very effective.  Recommended patient to stop Mobic and replace with Tylenol 500 mg 2 tabs twice daily if needed.  Patient verbalized understanding.  3. Immunizations  Patient is up to date with annual flu vaccine and pneumonia vaccine.  Recommend Shingrix and Covid-19 vaccine.  Thank you for allowing pharmacy to participate in this patient's care.  Verlin Fester, PharmD, [place], CPP Clinical Specialty Pharmacist [phone]  01/11/2020 10:46 AM

## 2020-01-11 NOTE — Assessment & Plan Note (Signed)
Plan: Complete medication reconciliation with clinical pharmacy team today Work with clinical pharmacy team today and inspiratory flows for inhaler education Work with clinical pharmacy team today to see which inhalers may be most affordable Okay to give Pneumovax 23 if patient would like

## 2020-01-11 NOTE — Assessment & Plan Note (Signed)
Plan:  Okay to receive Pneumovax 23 today with pharmacy team if patient would like  Patient be great candidate for Covid vaccine if and when it is available

## 2020-01-11 NOTE — Patient Instructions (Addendum)
You were seen today by Coral Ceo, NP  for:   1. Obstructive sleep apnea  We recommend that you continue using your BIPAP daily >>>Keep up the hard work using your device >>> Goal should be wearing this for the entire night that you are sleeping, at least 4 to 6 hours  Remember:  . Do not drive or operate heavy machinery if tired or drowsy.  . Please notify the supply company and office if you are unable to use your device regularly due to missing supplies or machine being broken.  . Work on maintaining a healthy weight and following your recommended nutrition plan  . Maintain proper daily exercise and movement  . Maintaining proper use of your device can also help improve management of other chronic illnesses such as: Blood pressure, blood sugars, and weight management.   BiPAP/ CPAP Cleaning:  >>>Clean weekly, with Dawn soap, and bottle brush.  Set up to air dry. >>> Wipe mask out daily with wet wipe or towelette   2. Chronic obstructive airway disease with asthma (HCC)  Continue Symbicort 80 >>> 2 puffs in the morning right when you wake up, rinse out your mouth after use, 12 hours later 2 puffs, rinse after use >>> Take this daily, no matter what >>> This is not a rescue inhaler    3. Left hemidiaphragm paralysis  4. Class 3 severe obesity due to excess calories with serious comorbidity and body mass index (BMI) of 40.0 to 44.9 in adult [facility])    Follow Up:    Return in about 6 months (around 07/10/2020), or if symptoms worsen or fail to improve, for Follow up with Dr. [name]- former BQ pt.   Please do your part to reduce the spread of COVID-19:      Reduce your risk of any infection  and COVID19 by using the similar precautions used for avoiding the common cold or flu:  Marland Kitchen Wash your hands often with soap and warm water for at least 20 seconds.  If soap and water are not readily available, use an alcohol-based hand sanitizer with at least 60% alcohol.  . If  coughing or sneezing, cover your mouth and nose by coughing or sneezing into the elbow areas of your shirt or coat, into a tissue or into your sleeve (not your hands). Drinda Butts A MASK when in public  . Avoid shaking hands with others and consider head nods or verbal greetings only. . Avoid touching your eyes, nose, or mouth with unwashed hands.  . Avoid close contact with people who are sick. . Avoid places or events with large numbers of people in one location, like concerts or sporting events. . If you have some symptoms but not all symptoms, continue to monitor at home and seek medical attention if your symptoms worsen. . If you are having a medical emergency, call 911.   ADDITIONAL HEALTHCARE OPTIONS FOR PATIENTS  [facility] / e-Visit: [email or web address]         [facility]: [phone]  [facility]: [phone]                   [facility]: [phone]     It is flu season:   >>> Best ways to protect herself from the flu: Receive the yearly flu vaccine, practice good hand hygiene washing with soap and also using hand sanitizer when available, eat a nutritious meals, get adequate rest, hydrate appropriately  Please contact the office if your symptoms worsen or you have concerns that you are not improving.   Thank you for choosing [facility] for your healthcare, and for allowing [place] to partner with you on your healthcare journey. I am thankful to be able to provide care to you today.   Wyn Quaker FNP-C

## 2020-01-11 NOTE — Assessment & Plan Note (Signed)
Plan: Work with clinical pharmacy team today to see which inhalers may be more affordable May not be unreasonable consider as needed ICS/LABA use or trialing off of inhalers completely

## 2020-01-23 ENCOUNTER — Other Ambulatory Visit: Payer: Self-pay | Admitting: Family Medicine

## 2020-03-01 ENCOUNTER — Encounter: Payer: Self-pay | Admitting: Family Medicine

## 2020-03-01 ENCOUNTER — Other Ambulatory Visit: Payer: Self-pay

## 2020-03-01 ENCOUNTER — Ambulatory Visit: Payer: 59 | Admitting: Family Medicine

## 2020-03-01 ENCOUNTER — Ambulatory Visit (INDEPENDENT_AMBULATORY_CARE_PROVIDER_SITE_OTHER): Payer: 59

## 2020-03-01 VITALS — BP 113/70 | HR 92 | Temp 98.7°F | Resp 20 | Ht 71.0 in | Wt 292.0 lb

## 2020-03-01 DIAGNOSIS — M545 Low back pain, unspecified: Secondary | ICD-10-CM

## 2020-03-01 DIAGNOSIS — F418 Other specified anxiety disorders: Secondary | ICD-10-CM | POA: Diagnosis not present

## 2020-03-01 DIAGNOSIS — R221 Localized swelling, mass and lump, neck: Secondary | ICD-10-CM

## 2020-03-01 MED ORDER — PREDNISONE 20 MG PO TABS: ORAL_TABLET | ORAL | 0 refills | Status: DC

## 2020-03-01 MED ORDER — FLUOXETINE HCL 10 MG PO CAPS: 10.0000 mg | ORAL_CAPSULE | Freq: Every day | ORAL | 3 refills | Status: DC

## 2020-03-01 NOTE — Progress Notes (Addendum)
Subjective:  Patient ID: Kyle Burns, male    DOB: 13-Jan-1966, 54 y.o.   MRN: [id number]  Patient Care Team: Raliegh Ip, DO as PCP - General (Family Medicine) Daleen Squibb, Jesse Sans, MD (Inactive) as Attending Physician (Cardiology)   Chief Complaint:  Back Pain (lower - center - running down bilateral buttock and thigh)   HPI: Kyle Burns is a 54 y.o. male presenting on 03/01/2020 for Back Pain (lower - center - running down bilateral buttock and thigh)   Back Pain This is a new problem. The current episode started 1 to 4 weeks ago. The problem occurs intermittently. The problem has been waxing and waning since onset. The pain is present in the gluteal and lumbar spine. The quality of the pain is described as aching. The pain does not radiate. The pain is at a severity of 3/10. The pain is mild. The symptoms are aggravated by bending (walking). Stiffness is present in the morning. Associated symptoms include tingling. Pertinent negatives include no abdominal pain, bladder incontinence, bowel incontinence, chest pain, dysuria, fever, headaches, leg pain, numbness, paresis, paresthesias, pelvic pain, perianal numbness, weakness or weight loss. Risk factors include obesity. He has tried analgesics for the symptoms. The treatment provided mild relief.   Pt also reports continued stress with noted heart palpitations when he feels worked up about something. 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 He has tried stress management and relaxation without relief of symptoms. States he feels the symptoms are not worsening but are still present.   He complains of a lump on his neck. States this has been present for a while but seems to be getting bigger. No erythema or pain. No known injury.    Relevant past medical, surgical, family, and social history reviewed and updated  as indicated.  Allergies and medications reviewed and updated. Date reviewed: Chart in Epic.   Past Medical History:  Diagnosis Date  . Asthma    hx of   . Back pain   . Bilevel positive airway pressure (BPAP) dependence   . GERD (gastroesophageal reflux disease)   . HTN (hypertension)   . Hyperlipidemia     Past Surgical History:  Procedure Laterality Date  . bilateral hernia repair     total of 3   . CYST REMOVAL NECK     age - 41s    Social History   Socioeconomic History  . Marital status: Single    Spouse name: Not on file  . Number of children: 0  . Years of education: Not on file  . Highest education level: Not on file  Occupational History  . Not on file  Tobacco Use  . Smoking status: Never Smoker  . Smokeless tobacco: Current User    Types: Chew  . Tobacco comment: Uses chew "when he has a bad day"  Substance and Sexual Activity  . Alcohol use: No    Comment: Socially  . Drug use: No  . Sexual activity: Not on file  Other Topics Concern  . Not on file  Social History Narrative  . Not on file   Social Determinants of Health   Financial Resource Strain:   . Difficulty of Paying Living Expenses:   Food Insecurity:   . Worried About Programme researcher, broadcasting/film/video in the Last Year:   .  Ran Out of Food in the Last Year:   Transportation Needs:   . Freight forwarder (Medical):   Marland Kitchen Lack of Transportation (Non-Medical):   Physical Activity:   . Days of Exercise per Week:   . Minutes of Exercise per Session:   Stress:   . Feeling of Stress :   Social Connections:   . Frequency of Communication with Friends and Family:   . Frequency of Social Gatherings with Friends and Family:   . Attends Religious Services:   . Active Member of Clubs or Organizations:   . Attends Banker Meetings:   Marland Kitchen Marital Status:   Intimate Partner Violence:   . Fear of Current or Ex-Partner:   . Emotionally Abused:   Marland Kitchen Physically Abused:   . Sexually Abused:      Outpatient Encounter Medications as of 03/01/2020  Medication Sig  . albuterol (VENTOLIN HFA) 108 (90 Base) MCG/ACT inhaler USE 2 PUFFS EVERY 6 HOURS AS NEEDED FOR WHEEZING  . aspirin 81 MG EC tablet Take 162 mg by mouth daily.   Marland Kitchen atorvastatin (LIPITOR) 10 MG tablet Take 1 tablet (10 mg total) by mouth daily.  . budesonide-formoterol (SYMBICORT) 80-4.5 MCG/ACT inhaler Inhale 2 puffs into the lungs 2 (two) times daily.  . cetirizine (ZYRTEC) 10 MG tablet Take 10 mg by mouth at bedtime.  . fluticasone (FLONASE) 50 MCG/ACT nasal spray Place 2 sprays into both nostrils daily.  . furosemide (LASIX) 20 MG tablet Take 1 tablet (20 mg total) by mouth daily.  Marland Kitchen losartan-hydrochlorothiazide (HYZAAR) 100-25 MG tablet Take 1 tablet by mouth daily.  Marland Kitchen omeprazole (PRILOSEC) 40 MG capsule TAKE ONE (1) CAPSULE EACH DAY  . OVER THE COUNTER MEDICATION VIT C , VIT D, Zinc, MVI 50 plus - all one a day  . Spacer/Aero-Holding Chambers (AEROCHAMBER MV) inhaler Use as instructed  . spironolactone (ALDACTONE) 25 MG tablet Take 1 tablet (25 mg total) by mouth daily.  Marland Kitchen FLUoxetine (PROZAC) 10 MG capsule Take 1 capsule (10 mg total) by mouth daily.  . predniSONE (DELTASONE) 20 MG tablet 2 po at sametime daily for 5 days   No facility-administered encounter medications on file as of 03/01/2020.    No Known Allergies  Review of Systems  Constitutional: Negative for activity change, appetite change, chills, diaphoresis, fatigue, fever, unexpected weight change and weight loss.  HENT: Negative.   Eyes: Negative.  Negative for photophobia and visual disturbance.  Respiratory: Negative for cough, chest tightness and shortness of breath.   Cardiovascular: Positive for palpitations. Negative for chest pain and leg swelling.  Gastrointestinal: Negative for abdominal pain, blood in stool, bowel incontinence, constipation, diarrhea, nausea and vomiting.  Endocrine: Negative.  Negative for cold intolerance, heat  intolerance, polydipsia, polyphagia and polyuria.  Genitourinary: Negative for bladder incontinence, decreased urine volume, difficulty urinating, dysuria, frequency, pelvic pain and urgency.  Musculoskeletal: Positive for arthralgias, back pain and myalgias.  Skin: Negative.   Allergic/Immunologic: Negative.   Neurological: Positive for tingling. Negative for dizziness, tremors, seizures, syncope, facial asymmetry, speech difficulty, weakness, light-headedness, numbness, headaches and paresthesias.  Hematological: Negative.   Psychiatric/Behavioral: Negative for agitation, behavioral problems, confusion, decreased concentration, dysphoric mood, hallucinations, self-injury, sleep disturbance and suicidal ideas. The patient is nervous/anxious. The patient is not hyperactive.   All other systems reviewed and are negative.       Objective:  BP 113/70   Pulse 92   Temp 98.7 F (37.1 C)   Resp 20   Ht 5\' 11"  (  1.803 m)   Wt 292 lb (132.5 kg) Comment: with police attire)  SpO2 96%   BMI 40.73 kg/m    Wt Readings from Last 3 Encounters:  03/01/20 292 lb (132.5 kg)  01/11/20 290 lb (131.5 kg)  01/03/20 289 lb (131.1 kg)    Physical Exam Vitals and nursing note reviewed.  Constitutional:      General: He is not in acute distress.    Appearance: Normal appearance. He is well-developed and well-groomed. He is obese. He is not ill-appearing, toxic-appearing or diaphoretic.  HENT:     Head: Normocephalic and atraumatic.     Jaw: There is normal jaw occlusion.     Right Ear: Hearing normal.     Left Ear: Hearing normal.     Nose: Nose normal.     Mouth/Throat:     Lips: Pink.     Mouth: Mucous membranes are moist.     Pharynx: Oropharynx is clear. Uvula midline.  Eyes:     General: Lids are normal.     Extraocular Movements: Extraocular movements intact.     Conjunctiva/sclera: Conjunctivae normal.     Pupils: Pupils are equal, round, and reactive to light.  Neck:     Thyroid: No  thyroid mass, thyromegaly or thyroid tenderness.     Vascular: No carotid bruit or JVD.     Trachea: Trachea and phonation normal.   Cardiovascular:     Rate and Rhythm: Normal rate and regular rhythm.     Chest Wall: PMI is not displaced.     Pulses: Normal pulses.     Heart sounds: Normal heart sounds. No murmur. No friction rub. No gallop.   Pulmonary:     Effort: Pulmonary effort is normal. No respiratory distress.     Breath sounds: Normal breath sounds. No wheezing.  Abdominal:     General: Bowel sounds are normal. There is no distension or abdominal bruit.     Palpations: Abdomen is soft. There is no hepatomegaly or splenomegaly.     Tenderness: There is no abdominal tenderness. There is no right CVA tenderness or left CVA tenderness.     Hernia: No hernia is present.  Musculoskeletal:     Cervical back: Normal range of motion and neck supple.     Lumbar back: Positive left straight leg raise test.     Right lower leg: No edema.     Left lower leg: No edema.  Lymphadenopathy:     Cervical: No cervical adenopathy.  Skin:    General: Skin is warm and dry.     Capillary Refill: Capillary refill takes less than 2 seconds.     Coloration: Skin is not cyanotic, jaundiced or pale.     Findings: No rash.  Neurological:     General: No focal deficit present.     Mental Status: He is alert and oriented to person, place, and time.     Cranial Nerves: Cranial nerves are intact. No cranial nerve deficit.     Sensory: Sensation is intact. No sensory deficit.     Motor: Motor function is intact. No weakness.     Coordination: Coordination is intact. Coordination normal.     Gait: Gait is intact. Gait normal.     Deep Tendon Reflexes: Reflexes are normal and symmetric. Reflexes normal.  Psychiatric:        Attention and Perception: Attention and perception normal.        Mood and Affect: Mood and affect normal.  Speech: Speech normal.        Behavior: Behavior normal. Behavior  is cooperative.        Thought Content: Thought content normal.        Cognition and Memory: Cognition and memory normal.        Judgment: Judgment normal.     Results for orders placed or performed in visit on 01/03/20  Thyroid Panel With TSH  Result Value Ref Range   TSH 0.662 0.450 - 4.500 uIU/mL   T4, Total 7.3 4.5 - 12.0 ug/dL   T3 Uptake Ratio 25 24 - 39 %   Free Thyroxine Index 1.8 1.2 - 4.9     X-Ray: lumbar spine: No acute findings. Preliminary x-ray reading by Monia Pouch, FNP-C, [facility].   Pertinent labs & imaging results that were available during my care of the patient were reviewed by me and considered in my medical decision making.  Assessment & Plan:  Kyndall was seen today for back pain.  Diagnoses and all orders for this visit:  Bilateral low back pain, unspecified chronicity, unspecified whether sciatica present Imaging without acute findings in the office. Will notify pt if radiology reading differs. No red flags concerning for Causa Equina syndrome. Will burst with steroids. Pt will use Voltaren gel three times daily.  -     DG Lumbar Spine 2-3 Views; Future -     predniSONE (DELTASONE) 20 MG tablet; 2 po at sametime daily for 5 days  Situational anxiety Has failed stress management for symptom control. Will initiate below. Pt aware to give medications time to work. Pt aware to report any new, worsening, or persistent symptoms.  -     FLUoxetine (PROZAC) 10 MG capsule; Take 1 capsule (10 mg total) by mouth daily.  Neck mass Will obtain US.  -     US Soft Tissue Head/Neck (NON-THYROID); Future     Continue all other maintenance medications.  Follow up plan: Return if symptoms worsen or fail to improve.  Continue healthy lifestyle choices, including diet (rich in fruits, vegetables, and lean proteins, and low in salt and simple carbohydrates) and exercise (at least 30 minutes of moderate physical activity daily).  Educational handout given for  sciatica.  The above assessment and management plan was discussed with the patient. The patient verbalized understanding of and has agreed to the management plan. Patient is aware to call the clinic if they develop any new symptoms or if symptoms persist or worsen. Patient is aware when to return to the clinic for a follow-up visit. Patient educated on when it is appropriate to go to the emergency department.   Robynn Pane, FNP student [facility] [phone]  I personally was present during the history, physical exam, and medical decision-making activities of this service and have verified that the service and findings are accurately documented in the nurse practitioner student's note.  Monia Pouch, FNP-C [facility] [address] [place], [place] [address] [phone]

## 2020-03-06 ENCOUNTER — Ambulatory Visit (HOSPITAL_COMMUNITY)
Admission: RE | Admit: 2020-03-06 | Discharge: 2020-03-06 | Disposition: A | Discharge status: Home / Self Care | Payer: 59 | Source: Ambulatory Visit | Attending: Family Medicine | Admitting: Family Medicine

## 2020-03-06 ENCOUNTER — Other Ambulatory Visit: Payer: Self-pay

## 2020-03-06 DIAGNOSIS — R221 Localized swelling, mass and lump, neck: Secondary | ICD-10-CM | POA: Diagnosis present

## 2020-03-22 ENCOUNTER — Ambulatory Visit: Payer: 59 | Attending: Internal Medicine

## 2020-03-22 DIAGNOSIS — Z23 Encounter for immunization: Secondary | ICD-10-CM

## 2020-03-22 NOTE — Progress Notes (Signed)
   Covid-19 Vaccination Clinic  Name:  Kyle Burns    MRN: [id number] DOB: 1966-03-13  03/22/2020  [name] was observed post Covid-19 immunization for 15 minutes without incident. He was provided with Vaccine Information Sheet and instruction to access the V-Safe system.   [name] was instructed to call 911 with any severe reactions post vaccine: Marland Kitchen Difficulty breathing  . Swelling of face and throat  . A fast heartbeat  . A bad rash all over body  . Dizziness and weakness   Immunizations Administered    Name Date Dose VIS Date Route   Moderna COVID-19 Vaccine 03/22/2020 10:45 AM 0.5 mL 11/15/2019 Intramuscular   Manufacturer: Moderna   Lot: [id number]   NDC: 99833-825-05

## 2020-03-27 ENCOUNTER — Encounter: Payer: Self-pay | Admitting: Family Medicine

## 2020-03-27 ENCOUNTER — Other Ambulatory Visit: Payer: Self-pay

## 2020-03-27 ENCOUNTER — Ambulatory Visit: Payer: 59 | Admitting: Family Medicine

## 2020-03-27 VITALS — BP 103/65 | HR 85 | Temp 98.1°F | Ht 71.0 in | Wt 291.0 lb

## 2020-03-27 DIAGNOSIS — F418 Other specified anxiety disorders: Secondary | ICD-10-CM | POA: Diagnosis not present

## 2020-03-27 MED ORDER — FLUOXETINE HCL 20 MG PO CAPS: 20.0000 mg | ORAL_CAPSULE | Freq: Every day | ORAL | 3 refills | Status: DC

## 2020-03-27 NOTE — Progress Notes (Signed)
Subjective:  Patient ID: Kyle Burns, male    DOB: [birth year], 54 y.o.   MRN: [id number]  Patient Care Team: Raliegh Ip, DO as PCP - General (Family Medicine) Daleen Squibb, Jesse Sans, MD (Inactive) as Attending Physician (Cardiology)   Chief Complaint:  Anxiety (following up with Dr. Nadine Counts)   HPI: Kyle Burns is a 54 y.o. male presenting on 03/27/2020 for Anxiety (following up with Dr. Nadine Counts)   Pt reports his anxiety has decreased but he does still have situational anxiety at times. He reports he will feel slightly nervous and have palpitations, states this only lasts a few seconds and then subsides. He is compliant with the fluoxetine without associated side effects.   Anxiety Presents for follow-up visit. Symptoms include muscle tension, nervous/anxious behavior and palpitations. Patient reports no chest pain, compulsions, confusion, decreased concentration, depressed mood, dizziness, dry mouth, excessive worry, feeling of choking, hyperventilation, impotence, insomnia, irritability, malaise, nausea, obsessions, panic, restlessness, shortness of breath or suicidal ideas. Symptoms occur occasionally. The severity of symptoms is mild. The quality of sleep is good.   Compliance with medications is 76-100%.    Relevant past medical, surgical, family, and social history reviewed and updated as indicated.  Allergies and medications reviewed and updated. Date reviewed: Chart in Epic.   Past Medical History:  Diagnosis Date  . Asthma    hx of   . Back pain   . Bilevel positive airway pressure (BPAP) dependence   . GERD (gastroesophageal reflux disease)   . HTN (hypertension)   . Hyperlipidemia     Past Surgical History:  Procedure Laterality Date  . bilateral hernia repair     total of 3   . CYST REMOVAL NECK     age - 45s    Social History   Socioeconomic History  . Marital status: Single    Spouse name: Not on file  . Number of children: 0  .  Years of education: Not on file  . Highest education level: Not on file  Occupational History  . Not on file  Tobacco Use  . Smoking status: Never Smoker  . Smokeless tobacco: Current User    Types: Chew  . Tobacco comment: Uses chew "when he has a bad day"  Substance and Sexual Activity  . Alcohol use: No    Comment: Socially  . Drug use: No  . Sexual activity: Not on file  Other Topics Concern  . Not on file  Social History Narrative  . Not on file   Social Determinants of Health   Financial Resource Strain:   . Difficulty of Paying Living Expenses:   Food Insecurity:   . Worried About Programme researcher, broadcasting/film/video in the Last Year:   . Barista in the Last Year:   Transportation Needs:   . Freight forwarder (Medical):   Marland Kitchen Lack of Transportation (Non-Medical):   Physical Activity:   . Days of Exercise per Week:   . Minutes of Exercise per Session:   Stress:   . Feeling of Stress :   Social Connections:   . Frequency of Communication with Friends and Family:   . Frequency of Social Gatherings with Friends and Family:   . Attends Religious Services:   . Active Member of Clubs or Organizations:   . Attends Banker Meetings:   Marland Kitchen Marital Status:   Intimate Partner Violence:   . Fear of Current or Ex-Partner:   . Emotionally  Abused:   Marland Kitchen Physically Abused:   . Sexually Abused:     Outpatient Encounter Medications as of 03/27/2020  Medication Sig  . albuterol (VENTOLIN HFA) 108 (90 Base) MCG/ACT inhaler USE 2 PUFFS EVERY 6 HOURS AS NEEDED FOR WHEEZING  . aspirin 81 MG EC tablet Take 162 mg by mouth daily.   . budesonide-formoterol (SYMBICORT) 80-4.5 MCG/ACT inhaler Inhale 2 puffs into the lungs 2 (two) times daily.  . cetirizine (ZYRTEC) 10 MG tablet Take 10 mg by mouth at bedtime.  . fluticasone (FLONASE) 50 MCG/ACT nasal spray Place 2 sprays into both nostrils daily.  . furosemide (LASIX) 20 MG tablet Take 1 tablet (20 mg total) by mouth daily.  Marland Kitchen  omeprazole (PRILOSEC) 40 MG capsule TAKE ONE (1) CAPSULE EACH DAY  . OVER THE COUNTER MEDICATION VIT C , VIT D, Zinc, MVI 50 plus - all one a day  . Spacer/Aero-Holding Chambers (AEROCHAMBER MV) inhaler Use as instructed  . [DISCONTINUED] FLUoxetine (PROZAC) 10 MG capsule Take 1 capsule (10 mg total) by mouth daily.  . [DISCONTINUED] predniSONE (DELTASONE) 20 MG tablet 2 po at sametime daily for 5 days  . atorvastatin (LIPITOR) 10 MG tablet Take 1 tablet (10 mg total) by mouth daily.  Marland Kitchen FLUoxetine (PROZAC) 20 MG capsule Take 1 capsule (20 mg total) by mouth daily.  Marland Kitchen losartan-hydrochlorothiazide (HYZAAR) 100-25 MG tablet Take 1 tablet by mouth daily.  Marland Kitchen spironolactone (ALDACTONE) 25 MG tablet Take 1 tablet (25 mg total) by mouth daily.   No facility-administered encounter medications on file as of 03/27/2020.    No Known Allergies  Review of Systems  Constitutional: Negative for activity change, appetite change, chills, diaphoresis, fatigue, fever, irritability and unexpected weight change.  HENT: Negative.   Eyes: Negative.  Negative for photophobia and visual disturbance.  Respiratory: Negative for cough, chest tightness and shortness of breath.   Cardiovascular: Positive for palpitations. Negative for chest pain and leg swelling.  Gastrointestinal: Negative for abdominal pain, blood in stool, constipation, diarrhea, nausea and vomiting.  Endocrine: Negative.  Negative for polydipsia, polyphagia and polyuria.  Genitourinary: Negative for decreased urine volume, difficulty urinating, dysuria, frequency, impotence and urgency.  Musculoskeletal: Positive for myalgias. Negative for arthralgias.  Skin: Negative.   Allergic/Immunologic: Negative.   Neurological: Negative for dizziness, tremors, seizures, syncope, facial asymmetry, speech difficulty, weakness, light-headedness, numbness and headaches.  Hematological: Negative.   Psychiatric/Behavioral: Negative for confusion, decreased  concentration, hallucinations, sleep disturbance and suicidal ideas. The patient is nervous/anxious. The patient does not have insomnia.   All other systems reviewed and are negative.       Objective:  BP 103/65   Pulse 85   Temp 98.1 F (36.7 C)   Ht 5\' 11"  (1.803 m)   Wt 291 lb (132 kg)   SpO2 97%   BMI 40.59 kg/m    Wt Readings from Last 3 Encounters:  03/27/20 291 lb (132 kg)  03/01/20 292 lb (132.5 kg)  01/11/20 290 lb (131.5 kg)    Physical Exam Vitals and nursing note reviewed.  Constitutional:      General: He is not in acute distress.    Appearance: Normal appearance. He is well-developed and well-groomed. He is obese. He is not ill-appearing, toxic-appearing or diaphoretic.  HENT:     Head: Normocephalic and atraumatic.     Jaw: There is normal jaw occlusion.     Right Ear: Hearing normal.     Left Ear: Hearing normal.     Nose: Nose  normal.     Mouth/Throat:     Lips: Pink.     Mouth: Mucous membranes are moist.     Pharynx: Oropharynx is clear. Uvula midline.  Eyes:     General: Lids are normal.     Extraocular Movements: Extraocular movements intact.     Conjunctiva/sclera: Conjunctivae normal.     Pupils: Pupils are equal, round, and reactive to light.  Neck:     Thyroid: No thyroid mass, thyromegaly or thyroid tenderness.     Vascular: No carotid bruit or JVD.     Trachea: Trachea and phonation normal.  Cardiovascular:     Rate and Rhythm: Normal rate and regular rhythm.     Chest Wall: PMI is not displaced.     Pulses: Normal pulses.     Heart sounds: Normal heart sounds. No murmur. No friction rub. No gallop.   Pulmonary:     Effort: Pulmonary effort is normal. No respiratory distress.     Breath sounds: Normal breath sounds. No wheezing.  Abdominal:     General: Bowel sounds are normal. There is no distension or abdominal bruit.     Palpations: Abdomen is soft. There is no hepatomegaly or splenomegaly.     Tenderness: There is no abdominal  tenderness. There is no right CVA tenderness or left CVA tenderness.     Hernia: No hernia is present.  Musculoskeletal:        General: Normal range of motion.     Cervical back: Normal range of motion and neck supple.     Right lower leg: No edema.     Left lower leg: No edema.  Lymphadenopathy:     Cervical: No cervical adenopathy.  Skin:    General: Skin is warm and dry.     Capillary Refill: Capillary refill takes less than 2 seconds.     Coloration: Skin is not cyanotic, jaundiced or pale.     Findings: No rash.  Neurological:     General: No focal deficit present.     Mental Status: He is alert and oriented to person, place, and time.     Cranial Nerves: Cranial nerves are intact. No cranial nerve deficit.     Sensory: Sensation is intact. No sensory deficit.     Motor: Motor function is intact. No weakness.     Coordination: Coordination is intact. Coordination normal.     Gait: Gait is intact. Gait normal.     Deep Tendon Reflexes: Reflexes are normal and symmetric. Reflexes normal.  Psychiatric:        Attention and Perception: Attention and perception normal.        Mood and Affect: Mood and affect normal.        Speech: Speech normal.        Behavior: Behavior normal. Behavior is cooperative.        Thought Content: Thought content normal.        Cognition and Memory: Cognition and memory normal.        Judgment: Judgment normal.     Results for orders placed or performed in visit on 01/03/20  Thyroid Panel With TSH  Result Value Ref Range   TSH 0.662 0.450 - 4.500 uIU/mL   T4, Total 7.3 4.5 - 12.0 ug/dL   T3 Uptake Ratio 25 24 - 39 %   Free Thyroxine Index 1.8 1.2 - 4.9       Pertinent labs & imaging results that were available during my care of  the patient were reviewed by me and considered in my medical decision making.  Assessment & Plan:  Majour was seen today for medical management of chronic issues, back pain and heart palpatations.  Diagnoses and  all orders for this visit:  Situational anxiety Has improved greatly on 10 mg daily but still having anxiety at times with palpitations. Will increase to 20 mg daily. Pt aware to report any new, worsening, or persistent symptoms. Follow up in 3 months.  -     FLUoxetine (PROZAC) 20 MG capsule; Take 1 capsule (20 mg total) by mouth daily.     Continue all other maintenance medications.  Follow up plan: Return in about 3 months (around 06/26/2020), or if symptoms worsen or fail to improve.  Continue healthy lifestyle choices, including diet (rich in fruits, vegetables, and lean proteins, and low in salt and simple carbohydrates) and exercise (at least 30 minutes of moderate physical activity daily).  The above assessment and management plan was discussed with the patient. The patient verbalized understanding of and has agreed to the management plan. Patient is aware to call the clinic if they develop any new symptoms or if symptoms persist or worsen. Patient is aware when to return to the clinic for a follow-up visit. Patient educated on when it is appropriate to go to the emergency department.   Kari Baars, FNP-C [facility] [phone]

## 2020-04-19 ENCOUNTER — Ambulatory Visit: Payer: 59 | Attending: Internal Medicine

## 2020-04-19 DIAGNOSIS — Z23 Encounter for immunization: Secondary | ICD-10-CM

## 2020-04-19 NOTE — Progress Notes (Signed)
   Covid-19 Vaccination Clinic  Name:  ROCHELLE NEPHEW    MRN: [id number] DOB: 1966-08-11  04/19/2020  Mr. Chittum was observed post Covid-19 immunization for 15 minutes without incident. He was provided with Vaccine Information Sheet and instruction to access the V-Safe system.   Mr. Brozek was instructed to call 911 with any severe reactions post vaccine: Marland Kitchen Difficulty breathing  . Swelling of face and throat  . A fast heartbeat  . A bad rash all over body  . Dizziness and weakness   Immunizations Administered    Name Date Dose VIS Date Route   Moderna COVID-19 Vaccine 04/19/2020 10:15 AM 0.5 mL 11/2019 Intramuscular   Manufacturer: Moderna   Lot: [id number]   NDC: 26203-559-74

## 2020-05-16 ENCOUNTER — Ambulatory Visit: Payer: 59 | Admitting: Family Medicine

## 2020-05-16 ENCOUNTER — Other Ambulatory Visit: Payer: Self-pay

## 2020-05-16 ENCOUNTER — Encounter: Payer: Self-pay | Admitting: Family Medicine

## 2020-05-16 VITALS — BP 132/89 | HR 77 | Temp 96.9°F | Ht 70.0 in | Wt 284.0 lb

## 2020-05-16 DIAGNOSIS — G4733 Obstructive sleep apnea (adult) (pediatric): Secondary | ICD-10-CM

## 2020-05-16 DIAGNOSIS — J449 Chronic obstructive pulmonary disease, unspecified: Secondary | ICD-10-CM | POA: Diagnosis not present

## 2020-05-16 DIAGNOSIS — Z125 Encounter for screening for malignant neoplasm of prostate: Secondary | ICD-10-CM

## 2020-05-16 DIAGNOSIS — I10 Essential (primary) hypertension: Secondary | ICD-10-CM | POA: Diagnosis not present

## 2020-05-16 DIAGNOSIS — E782 Mixed hyperlipidemia: Secondary | ICD-10-CM

## 2020-05-16 DIAGNOSIS — F418 Other specified anxiety disorders: Secondary | ICD-10-CM

## 2020-05-16 DIAGNOSIS — Z7689 Persons encountering health services in other specified circumstances: Secondary | ICD-10-CM

## 2020-05-16 DIAGNOSIS — Z6841 Body Mass Index (BMI) 40.0 and over, adult: Secondary | ICD-10-CM

## 2020-05-16 MED ORDER — ALBUTEROL SULFATE HFA 108 (90 BASE) MCG/ACT IN AERS: INHALATION_SPRAY | RESPIRATORY_TRACT | 1 refills | Status: DC

## 2020-05-16 NOTE — Progress Notes (Signed)
Subjective: CC: HTN, HLD, OSA, COPD/ Asthma, est care PCP: Janora Norlander, DO HGD:[id number] Kyle Burns is a 54 y.o. male presenting to clinic today for:  1.  Hypertension with hyperlipidemia Patient reports compliance with Lipitor 10 mg daily, Hyzaar 100-25 mg daily, Lasix 20 mg daily and Aldactone 25 mg daily.  No chest pain, shortness of breath, visual disturbance, lower extremity edema.  He reports occasional sensation of heart palpitations but he finds this to be true when he drinks his flavored waters which are high in potassium.  2.  OSA/COPD/asthma Patient is followed closely by Dr. Lake Bells every 6 months.  He uses Symbicort and albuterol as needed.  He thinks his albuterol is about to expire would like to go to get new ones.  He reports very rare use of the albuterol, less than 1 time per month.  3.  Situational anxiety Patient notes that he typically has a globus sensation whenever he is in public eating.  He does have history of injury to the neck such that he developed a cyst that was never fully removed secondary to excessive bleeding in his 79s.  He does not have globus sensation outside of being in public.  Symptoms do seem to be improved after adding the fluoxetine 20 mg daily.  There certainly less frequent/intense.   Past Medical History:  Diagnosis Date   Asthma    hx of    Back pain    Bilevel positive airway pressure (BPAP) dependence    GERD (gastroesophageal reflux disease)    HTN (hypertension)    Hyperlipidemia     Current Outpatient Medications:    albuterol (VENTOLIN HFA) 108 (90 Base) MCG/ACT inhaler, USE 2 PUFFS EVERY 6 HOURS AS NEEDED FOR WHEEZING, Disp: 18 g, Rfl: 11   aspirin 81 MG EC tablet, Take 162 mg by mouth daily. , Disp: , Rfl:    atorvastatin (LIPITOR) 10 MG tablet, Take 1 tablet (10 mg total) by mouth daily., Disp: 90 tablet, Rfl: 2   budesonide-formoterol (SYMBICORT) 80-4.5 MCG/ACT inhaler, Inhale 2 puffs into the lungs 2 (two)  times daily., Disp: 1 Inhaler, Rfl: 11   cetirizine (ZYRTEC) 10 MG tablet, Take 10 mg by mouth at bedtime., Disp: , Rfl:    FLUoxetine (PROZAC) 20 MG capsule, Take 1 capsule (20 mg total) by mouth daily., Disp: 90 capsule, Rfl: 3   fluticasone (FLONASE) 50 MCG/ACT nasal spray, Place 2 sprays into both nostrils daily., Disp: 16 g, Rfl: 6   furosemide (LASIX) 20 MG tablet, Take 1 tablet (20 mg total) by mouth daily., Disp: 90 tablet, Rfl: 6   losartan-hydrochlorothiazide (HYZAAR) 100-25 MG tablet, Take 1 tablet by mouth daily., Disp: 90 tablet, Rfl: 2   omeprazole (PRILOSEC) 40 MG capsule, TAKE ONE (1) CAPSULE EACH DAY, Disp: 90 capsule, Rfl: 1   OVER THE COUNTER MEDICATION, VIT C , VIT D, Zinc, MVI 50 plus - all one a day, Disp: , Rfl:    Spacer/Aero-Holding Chambers (AEROCHAMBER MV) inhaler, Use as instructed, Disp: 1 each, Rfl: 0   spironolactone (ALDACTONE) 25 MG tablet, Take 1 tablet (25 mg total) by mouth daily., Disp: 90 tablet, Rfl: 2 Social History   Socioeconomic History   Marital status: Single    Spouse name: Not on file   Number of children: 0   Years of education: Not on file   Highest education level: Not on file  Occupational History   Not on file  Tobacco Use   Smoking status: Never  Smoker   Smokeless tobacco: Current User    Types: Chew   Tobacco comment: Uses chew "when he has a bad day"  Substance and Sexual Activity   Alcohol use: No    Comment: Socially   Drug use: No   Sexual activity: Not on file  Other Topics Concern   Not on file  Social History Narrative   Not on file   Social Determinants of Health   Financial Resource Strain:    Difficulty of Paying Living Expenses:   Food Insecurity:    Worried About Charity fundraiser in the Last Year:    Arboriculturist in the Last Year:   Transportation Needs:    Film/video editor (Medical):    Lack of Transportation (Non-Medical):   Physical Activity:    Days of Exercise  per Week:    Minutes of Exercise per Session:   Stress:    Feeling of Stress :   Social Connections:    Frequency of Communication with Friends and Family:    Frequency of Social Gatherings with Friends and Family:    Attends Religious Services:    Active Member of Clubs or Organizations:    Attends Music therapist:    Marital Status:   Intimate Partner Violence:    Fear of Current or Ex-Partner:    Emotionally Abused:    Physically Abused:    Sexually Abused:    Family History  Problem Relation Age of Onset   Asthma Father    Cancer Father        stomach   Arthritis Father    Asthma Maternal Grandfather    Diabetes Maternal Grandfather    Stroke Maternal Grandfather    Asthma Paternal Grandfather    Crohn's disease Mother    Arthritis Mother        back pain    Diabetes Mother    Hypertension Sister    Multiple sclerosis Brother    Hypertension Maternal Grandmother     Objective: Office vital signs reviewed. BP 132/89    Pulse 77    Temp (!) 96.9 F (36.1 C) (Temporal)    Ht _0  (1.778 m)    Wt 284 lb (128.8 kg)    BMI 40.75 kg/m   Physical Examination:  General: Awake, alert, well nourished, No acute distress HEENT: Normal, sclera white, MMM Cardio: regular rate and rhythm, S1S2 heard, no murmurs appreciated Pulm: Decreased breath sounds in the left lower bases.  He is otherwise clear to auscultation bilaterally with no wheezes, rhonchi or rales.  His normal work of breathing on room air. Extremities: warm, well perfused, No edema, cyanosis or clubbing; +2 pulses bilaterally MSK: normal gait and station Psych: Mood stable, speech normal, affect appropriate, pleasant and interactive Depression screen [facility] 2/9 05/16/2020 03/27/2020 03/27/2020  Decreased Interest 0 0 0  Down, Depressed, Hopeless 0 0 0  PHQ - 2 Score 0 0 0  Altered sleeping 0 0 -  Tired, decreased energy 0 0 -  Change in appetite 0 0 -  Feeling bad or failure  about yourself  0 0 -  Trouble concentrating 0 0 -  Moving slowly or fidgety/restless 0 0 -  Suicidal thoughts 0 0 -  PHQ-9 Score 0 0 -   GAD 7 : Generalized Anxiety Score 05/16/2020  Nervous, Anxious, on Edge 1  Control/stop worrying 0  Worry too much - different things 0  Trouble relaxing 1  Restless 1  Easily annoyed or irritable 0  Afraid - awful might happen 0  Total GAD 7 Score 3   Assessment/ Plan: 54 y.o. male   1. Essential hypertension with goal blood pressure less than 130/80 Well-controlled.  Continue current regimen - CMP14+EGFR; Future  2. Mixed hyperlipidemia Plan for fasting lipid panel in 6 months at his annual physical - CMP14+EGFR; Future - Lipid panel; Future - TSH; Future  3. Obstructive sleep apnea  4. Chronic obstructive airway disease with asthma ([place]) As needed use of inhalers.  Albuterol sent - albuterol (VENTOLIN HFA) 108 (90 Base) MCG/ACT inhaler; USE 2 PUFFS EVERY 6 HOURS AS NEEDED FOR WHEEZING (1 for home, 1 for work)  Dispense: 36 g; Refill: 1  5. Class 3 severe obesity due to excess calories with serious comorbidity and body mass index (BMI) of 40.0 to 44.9 in adult [facility]) Working on weight modification  6. Establishing care with new doctor, encounter for  7. Screening for malignant neoplasm of prostate Advised to abstain from intercourse for 24 hours prior to collection. - PSA; Future  8. Situational anxiety Well-controlled with present   No orders of the defined types were placed in this encounter.  No orders of the defined types were placed in this encounter.    Janora Norlander, DO [place] [phone]

## 2020-05-16 NOTE — Patient Instructions (Signed)
See me in 6 months.  I have put future labs in for you.  Come fasting a few days before the appointment.

## 2020-06-26 ENCOUNTER — Other Ambulatory Visit: Payer: Self-pay | Admitting: *Deleted

## 2020-06-26 MED ORDER — OMEPRAZOLE 40 MG PO CPDR: 40.0000 mg | DELAYED_RELEASE_CAPSULE | Freq: Every day | ORAL | 3 refills | Status: DC

## 2020-07-23 ENCOUNTER — Other Ambulatory Visit: Payer: Self-pay | Admitting: *Deleted

## 2020-07-23 DIAGNOSIS — I10 Essential (primary) hypertension: Secondary | ICD-10-CM

## 2020-07-23 MED ORDER — SPIRONOLACTONE 25 MG PO TABS
25.0000 mg | ORAL_TABLET | Freq: Every day | ORAL | 1 refills | Status: DC
Start: 2020-07-23 — End: 2021-01-23

## 2020-07-25 ENCOUNTER — Encounter: Payer: Self-pay | Admitting: *Deleted

## 2020-08-13 ENCOUNTER — Ambulatory Visit: Payer: PRIVATE HEALTH INSURANCE | Admitting: Nurse Practitioner

## 2020-08-13 ENCOUNTER — Other Ambulatory Visit: Payer: Self-pay

## 2020-08-13 ENCOUNTER — Encounter: Payer: Self-pay | Admitting: Nurse Practitioner

## 2020-08-13 VITALS — BP 114/79 | HR 79 | Temp 97.0°F | Ht 70.0 in | Wt 289.4 lb

## 2020-08-13 DIAGNOSIS — L918 Other hypertrophic disorders of the skin: Secondary | ICD-10-CM | POA: Diagnosis not present

## 2020-08-13 NOTE — Assessment & Plan Note (Signed)
Patient is a 54 year old male who presents to clinic with bleeding skin tag on right side of face closer to ear..  Patient reports that in the last few weeks after shaving his shaving blade will nick skin tag and cause it to bleed.  Patient went to the urgent care and was given clindamycin antibiotic.  Patient wants to make sure that skin is fine.  After assessment, advised patient to complete antibiotic and return in 2 to 3 weeks for reassessment and continuous management.  Patient denies fever, ear pain.  Advised patient to continue to keep skin dry and avoid using blade around skin.

## 2020-08-13 NOTE — Patient Instructions (Signed)
Skin Tag, Adult  A skin tag (acrochordon) is a soft, extra growth of skin. Most skin tags are flesh-colored and rarely bigger than a pencil eraser. They commonly form near areas where there are folds in the skin, such as the armpit or groin. Skin tags are not dangerous, and they do not spread from person to person (are not contagious). You may have one skin tag or several. Skin tags do not require treatment. However, your health care provider may recommend removal of a skin tag if it:  Gets irritated from clothing.  Bleeds.  Is visible and unsightly. Your health care provider can remove skin tags with a simple surgical procedure or a procedure that involves freezing the skin tag. Follow these instructions at home:  Watch for any changes in your skin tag. A normal skin tag does not require any other special care at home.  Take over-the-counter and prescription medicines only as told by your health care provider.  Keep all follow-up visits as told by your health care provider. This is important. Contact a health care provider if:  You have a skin tag that: ? Becomes painful. ? Changes color. ? Bleeds. ? Swells.  You develop more skin tags. This information is not intended to replace advice given to you by your health care provider. Make sure you discuss any questions you have with your health care provider. Document Revised: 11/13/2017 Document Reviewed: 12/16/2015 Elsevier Patient Education  2020 [employer]  

## 2020-08-13 NOTE — Progress Notes (Signed)
Acute Office Visit  Subjective:    Patient ID: Kyle Burns, male    DOB: May 29, 1966, 54 y.o.   MRN: [id number]  Chief Complaint  Patient presents with   Sore on Face    right side     Patient is 54 year old male who is in clinic today for  skin tag on right side of face closer to ear.  Patient reports that in the last few weeks after shaving his shaving blade nicks skin tag and cause it to bleed.  Patient went to the urgent care and was given clindamycin antibiotics.  Patient wants to make sure his skin is fine and needs no further treatments.  Past Medical History:  Diagnosis Date   Asthma    hx of    Back pain    Bilevel positive airway pressure (BPAP) dependence    GERD (gastroesophageal reflux disease)    HTN (hypertension)    Hyperlipidemia     Past Surgical History:  Procedure Laterality Date   bilateral hernia repair     total of 3    CYST REMOVAL NECK     age - 64s    Family History  Problem Relation Age of Onset   Asthma Father    Cancer Father        stomach   Arthritis Father    Asthma Maternal Grandfather    Diabetes Maternal Grandfather    Stroke Maternal Grandfather    Asthma Paternal Grandfather    Crohn's disease Mother    Arthritis Mother        back pain    Diabetes Mother    Hypertension Sister    Multiple sclerosis Brother    Hypertension Maternal Grandmother     Social History   Socioeconomic History   Marital status: Single    Spouse name: Not on file   Number of children: 0   Years of education: Not on file   Highest education level: Not on file  Occupational History   Not on file  Tobacco Use   Smoking status: Never Smoker   Smokeless tobacco: Current User    Types: Chew   Tobacco comment: Uses chew "when he has a bad day"  Vaping Use   Vaping Use: Never used  Substance and Sexual Activity   Alcohol use: No    Comment: Socially   Drug use: No   Sexual activity: Not on file  Other  Topics Concern   Not on file  Social History Narrative   Not on file   Social Determinants of Health                                                                         Outpatient Medications Prior to Visit  Medication Sig Dispense Refill   albuterol (VENTOLIN HFA) 108 (90 Base) MCG/ACT inhaler USE 2 PUFFS EVERY 6 HOURS AS NEEDED FOR WHEEZING (1 for home, 1 for work) 36 g 1   aspirin 81 MG EC tablet Take 162 mg by mouth daily.      budesonide-formoterol (SYMBICORT) 80-4.5 MCG/ACT inhaler Inhale 2 puffs into the lungs 2 (two) times daily. 1 Inhaler 11   cetirizine (ZYRTEC) 10 MG tablet Take 10 mg by  mouth at bedtime.     doxycycline (VIBRA-TABS) 100 MG tablet Take by mouth.     fluticasone (FLONASE) 50 MCG/ACT nasal spray Place 2 sprays into both nostrils daily. 16 g 6   furosemide (LASIX) 20 MG tablet Take 1 tablet (20 mg total) by mouth daily. 90 tablet 6   omeprazole (PRILOSEC) 40 MG capsule Take 1 capsule (40 mg total) by mouth daily. 90 capsule 3   OVER THE COUNTER MEDICATION VIT C , VIT D, Zinc, MVI 50 plus - all one a day     Spacer/Aero-Holding Chambers (AEROCHAMBER MV) inhaler Use as instructed 1 each 0   spironolactone (ALDACTONE) 25 MG tablet Take 1 tablet (25 mg total) by mouth daily. 90 tablet 1   atorvastatin (LIPITOR) 10 MG tablet Take 1 tablet (10 mg total) by mouth daily. 90 tablet 2   FLUoxetine (PROZAC) 20 MG capsule Take 1 capsule (20 mg total) by mouth daily. 90 capsule 3   losartan-hydrochlorothiazide (HYZAAR) 100-25 MG tablet Take 1 tablet by mouth daily. 90 tablet 2   No facility-administered medications prior to visit.    No Known Allergies  Review of Systems  Constitutional: Negative.   HENT: Negative.   Eyes: Negative.   Respiratory: Negative.   Cardiovascular: Negative.   Gastrointestinal: Negative.   Musculoskeletal: Negative.   Skin:       Skin tag       Objective:    Physical Exam Vitals  reviewed.  Constitutional:      Appearance: Normal appearance.  HENT:     Nose: Nose normal.  Eyes:     Conjunctiva/sclera: Conjunctivae normal.  Cardiovascular:     Rate and Rhythm: Normal rate and regular rhythm.     Pulses: Normal pulses.     Heart sounds: Normal heart sounds.  Pulmonary:     Effort: Pulmonary effort is normal.     Breath sounds: Normal breath sounds.  Abdominal:     General: Bowel sounds are normal.  Musculoskeletal:        General: No tenderness.  Neurological:     Mental Status: He is alert.  Psychiatric:        Mood and Affect: Mood normal.        Behavior: Behavior normal.     BP 114/79    Pulse 79    Temp (!) 97 F (36.1 C) (Temporal)    Ht 5\' 10"  (1.778 m)    Wt 289 lb 6.4 oz (131.3 kg)    SpO2 93%    BMI 41.52 kg/m  Wt Readings from Last 3 Encounters:  08/13/20 289 lb 6.4 oz (131.3 kg)  05/16/20 284 lb (128.8 kg)  03/27/20 291 lb (132 kg)    Health Maintenance Due  Topic Date Due   INFLUENZA VACCINE  07/15/2020      Lab Results  Component Value Date   TSH 0.662 01/03/2020   Lab Results  Component Value Date   WBC 7.9 11/16/2019   HGB 15.6 11/16/2019   HCT 47.1 11/16/2019   MCV 87 11/16/2019   PLT 338 11/16/2019   Lab Results  Component Value Date   NA 140 11/16/2019   K 3.9 11/16/2019   CO2 27 11/16/2019   GLUCOSE 107 (H) 11/16/2019   BUN 23 11/16/2019   CREATININE 1.16 11/16/2019   BILITOT 0.3 11/16/2019   ALKPHOS 83 11/16/2019   AST 21 11/16/2019   ALT 29 11/16/2019   PROT 7.3 11/16/2019   ALBUMIN 4.8 11/16/2019  CALCIUM 10.1 11/16/2019   Lab Results  Component Value Date   CHOL 125 11/16/2019   Lab Results  Component Value Date   HDL 48 11/16/2019   Lab Results  Component Value Date   LDLCALC 66 11/16/2019   Lab Results  Component Value Date   TRIG 49 11/16/2019   Lab Results  Component Value Date   CHOLHDL 2.6 11/16/2019     Assessment & Plan:  Skin tag Patient is a 54 year old male who  presents to clinic with bleeding skin tag on right side of face closer to ear..  Patient reports that in the last few weeks after shaving his shaving blade will nick skin tag and cause it to bleed.  Patient went to the urgent care and was given clindamycin antibiotic.  Patient wants to make sure that skin is fine.  After assessment, advised patient to complete antibiotic and return in 2 to 3 weeks for reassessment and continuous management.  Patient denies fever, ear pain.  Advised patient to continue to keep skin dry and avoid using blade around skin.     Problem List Items Addressed This Visit      Musculoskeletal and Integument   Skin tag - Primary         Daryll Drown, NP

## 2020-08-14 ENCOUNTER — Other Ambulatory Visit: Payer: Self-pay | Admitting: *Deleted

## 2020-08-14 DIAGNOSIS — E782 Mixed hyperlipidemia: Secondary | ICD-10-CM

## 2020-08-14 MED ORDER — ATORVASTATIN CALCIUM 10 MG PO TABS: 10.0000 mg | ORAL_TABLET | Freq: Every day | ORAL | 0 refills | Status: DC

## 2020-09-03 ENCOUNTER — Encounter: Payer: Self-pay | Admitting: Nurse Practitioner

## 2020-09-03 ENCOUNTER — Other Ambulatory Visit: Payer: Self-pay

## 2020-09-03 ENCOUNTER — Ambulatory Visit: Payer: PRIVATE HEALTH INSURANCE | Admitting: Nurse Practitioner

## 2020-09-03 VITALS — BP 105/58 | HR 87 | Temp 97.4°F | Ht 70.0 in | Wt 290.6 lb

## 2020-09-03 DIAGNOSIS — Z23 Encounter for immunization: Secondary | ICD-10-CM | POA: Diagnosis not present

## 2020-09-03 DIAGNOSIS — L918 Other hypertrophic disorders of the skin: Secondary | ICD-10-CM

## 2020-09-03 NOTE — Assessment & Plan Note (Signed)
Patient's skin tag is unchanged in the last 3 weeks.  Patient is not reporting worsening symptoms. Redness remains unchanged, no signs or symptoms of bleeding or infection.  Provided education with printed handouts given to patient.  Referral to dermatology completed  Follow-up with worsening or unresolved symptoms.

## 2020-09-03 NOTE — Patient Instructions (Addendum)
Skin Tag, Adult  A skin tag (acrochordon) is a soft, extra growth of skin. Most skin tags are flesh-colored and rarely bigger than a pencil eraser. They commonly form near areas where there are folds in the skin, such as the armpit or groin. Skin tags are not dangerous, and they do not spread from person to person (are not contagious). You may have one skin tag or several. Skin tags do not require treatment. However, your health care provider may recommend removal of a skin tag if it: Gets irritated from Hypertension, Adult Hypertension is another name for high blood pressure. High blood pressure forces your heart to work harder to pump blood. This can cause problems over time. There are two numbers in a blood pressure reading. There is a top number (systolic) over a bottom number (diastolic). It is best to have a blood pressure that is below 120/80. Healthy choices can help lower your blood pressure, or you may need medicine to help lower it. What are the causes? The cause of this condition is not known. Some conditions may be related to high blood pressure. What increases the risk?  Smoking.  Having type 2 diabetes mellitus, high cholesterol, or both.  Not getting enough exercise or physical activity.  Being overweight.  Having too much fat, sugar, calories, or salt (sodium) in your diet.  Drinking too much alcohol.  Having long-term (chronic) kidney disease.  Having a family history of high blood pressure.  Age. Risk increases with age.  Race. You may be at higher risk if you are African American.  Gender. Men are at higher risk than women before age 16. After age 74, women are at higher risk than men.  Having obstructive sleep apnea.  Stress. What are the signs or symptoms?  High blood pressure may not cause symptoms. Very high blood pressure (hypertensive crisis) may cause: ? Headache. ? Feelings of worry or nervousness (anxiety). ? Shortness of  breath. ? Nosebleed. ? A feeling of being sick to your stomach (nausea). ? Throwing up (vomiting). ? Changes in how you see. ? Very bad chest pain. ? Seizures. How is this treated?  This condition is treated by making healthy lifestyle changes, such as: ? Eating healthy foods. ? Exercising more. ? Drinking less alcohol.  Your health care provider may prescribe medicine if lifestyle changes are not enough to get your blood pressure under control, and if: ? Your top number is above 130. ? Your bottom number is above 80.  Your personal target blood pressure may vary. Follow these instructions at home: Eating and drinking   If told, follow the DASH eating plan. To follow this plan: ? Fill one half of your plate at each meal with fruits and vegetables. ? Fill one fourth of your plate at each meal with whole grains. Whole grains include whole-wheat pasta, brown rice, and whole-grain bread. ? Eat or drink low-fat dairy products, such as skim milk or low-fat yogurt. ? Fill one fourth of your plate at each meal with low-fat (lean) proteins. Low-fat proteins include fish, chicken without skin, eggs, beans, and tofu. ? Avoid fatty meat, cured and processed meat, or chicken with skin. ? Avoid pre-made or processed food.  Eat less than 1,500 mg of salt each day.  Do not drink alcohol if: ? Your doctor tells you not to drink. ? You are pregnant, may be pregnant, or are planning to become pregnant.  If you drink alcohol: ? Limit how much you use to:  0-1 drink a day for women.  0-2 drinks a day for men. ? Be aware of how much alcohol is in your drink. In the [place] one drink equals one 12 oz bottle of beer (355 mL), one 5 oz glass of wine (148 mL), or one 1 oz glass of hard liquor (44 mL). Lifestyle   Work with your doctor to stay at a healthy weight or to lose weight. Ask your doctor what the best weight is for you.  Get at least 30 minutes of exercise most days of the week. This  may include walking, swimming, or biking.  Get at least 30 minutes of exercise that strengthens your muscles (resistance exercise) at least 3 days a week. This may include lifting weights or doing Pilates.  Do not use any products that contain nicotine or tobacco, such as cigarettes, e-cigarettes, and chewing tobacco. If you need help quitting, ask your doctor.  Check your blood pressure at home as told by your doctor.  Keep all follow-up visits as told by your doctor. This is important. Medicines  Take over-the-counter and prescription medicines only as told by your doctor. Follow directions carefully.  Do not skip doses of blood pressure medicine. The medicine does not work as well if you skip doses. Skipping doses also puts you at risk for problems.  Ask your doctor about side effects or reactions to medicines that you should watch for. Contact a doctor if you:  Think you are having a reaction to the medicine you are taking.  Have headaches that keep coming back (recurring).  Feel dizzy.  Have swelling in your ankles.  Have trouble with your vision. Get help right away if you:  Get a very bad headache.  Start to feel mixed up (confused).  Feel weak or numb.  Feel faint.  Have very bad pain in your: ? Chest. ? Belly (abdomen).  Throw up more than once.  Have trouble breathing. Summary  Hypertension is another name for high blood pressure.  High blood pressure forces your heart to work harder to pump blood.  For most people, a normal blood pressure is less than 120/80.  Making healthy choices can help lower blood pressure. If your blood pressure does not get lower with healthy choices, you may need to take medicine. This information is not intended to replace advice given to you by your health care provider. Make sure you discuss any questions you have with your health care provider. Document Revised: 08/11/2018 Document Reviewed: 08/11/2018 Elsevier Patient  Education  2020 [employer].   clothing.  Bleeds.  Is visible and unsightly. Your health care provider can remove skin tags with a simple surgical procedure or a procedure that involves freezing the skin tag. Follow these instructions at home:  Watch for any changes in your skin tag. A normal skin tag does not require any other special care at home.  Take over-the-counter and prescription medicines only as told by your health care provider.  Keep all follow-up visits as told by your health care provider. This is important. Contact a health care provider if:  You have a skin tag that: ? Becomes painful. ? Changes color. ? Bleeds. ? Swells.  You develop more skin tags. This information is not intended to replace advice given to you by your health care provider. Make sure you discuss any questions you have with your health care provider. Document Revised: 11/13/2017 Document Reviewed: 12/16/2015 Elsevier Patient Education  2020 [employer].

## 2020-09-03 NOTE — Progress Notes (Signed)
Established Patient Office Visit  Subjective:  Patient ID: Kyle Burns, male    DOB: [birth year]  Age: 54 y.o. MRN: [id number]  CC:  Chief Complaint  Patient presents with  . Follow-up    skin tag    HPI THERMAN HUGHLETT is a 54 year old male who presents for follow-up skin tag on right side of face.  Patient is not reporting worsening symptoms.  Skin tag is not any better, redness unchanged.  Patient is not reporting any bleeding, signs or symptoms of infection.  Past Medical History:  Diagnosis Date  . Asthma    hx of   . Back pain   . Bilevel positive airway pressure (BPAP) dependence   . GERD (gastroesophageal reflux disease)   . HTN (hypertension)   . Hyperlipidemia     Past Surgical History:  Procedure Laterality Date  . bilateral hernia repair     total of 3   . CYST REMOVAL NECK     age - 20s    Family History  Problem Relation Age of Onset  . Asthma Father   . Cancer Father        stomach  . Arthritis Father   . Asthma Maternal Grandfather   . Diabetes Maternal Grandfather   . Stroke Maternal Grandfather   . Asthma Paternal Grandfather   . Crohn's disease Mother   . Arthritis Mother        back pain   . Diabetes Mother   . Hypertension Sister   . Multiple sclerosis Brother   . Hypertension Maternal Grandmother     Social History   Socioeconomic History  . Marital status: Single    Spouse name: Not on file  . Number of children: 0  . Years of education: Not on file  . Highest education level: Not on file  Occupational History  . Not on file  Tobacco Use  . Smoking status: Never Smoker  . Smokeless tobacco: Current User    Types: Chew  . Tobacco comment: Uses chew "when he has a bad day"  Vaping Use  . Vaping Use: Never used  Substance and Sexual Activity  . Alcohol use: No    Comment: Socially  . Drug use: No  . Sexual activity: Not on file  Other Topics Concern  . Not on file  Social History Narrative  . Not on file    Social Determinants of Health   Financial Resource Strain:   . Difficulty of Paying Living Expenses: Not on file  Food Insecurity:   . Worried About Programme researcher, broadcasting/film/video in the Last Year: Not on file  . Ran Out of Food in the Last Year: Not on file  Transportation Needs:   . Lack of Transportation (Medical): Not on file  . Lack of Transportation (Non-Medical): Not on file  Physical Activity:   . Days of Exercise per Week: Not on file  . Minutes of Exercise per Session: Not on file  Stress:   . Feeling of Stress : Not on file  Social Connections:   . Frequency of Communication with Friends and Family: Not on file  . Frequency of Social Gatherings with Friends and Family: Not on file  . Attends Religious Services: Not on file  . Active Member of Clubs or Organizations: Not on file  . Attends Banker Meetings: Not on file  . Marital Status: Not on file  Intimate Partner Violence:   . Fear of Current or Ex-Partner:  Not on file  . Emotionally Abused: Not on file  . Physically Abused: Not on file  . Sexually Abused: Not on file    Outpatient Medications Prior to Visit  Medication Sig Dispense Refill  . albuterol (VENTOLIN HFA) 108 (90 Base) MCG/ACT inhaler USE 2 PUFFS EVERY 6 HOURS AS NEEDED FOR WHEEZING (1 for home, 1 for work) 36 g 1  . aspirin 81 MG EC tablet Take 162 mg by mouth daily.     Marland Kitchen atorvastatin (LIPITOR) 10 MG tablet Take 1 tablet (10 mg total) by mouth daily. 90 tablet 0  . budesonide-formoterol (SYMBICORT) 80-4.5 MCG/ACT inhaler Inhale 2 puffs into the lungs 2 (two) times daily. 1 Inhaler 11  . cetirizine (ZYRTEC) 10 MG tablet Take 10 mg by mouth at bedtime.    . fluticasone (FLONASE) 50 MCG/ACT nasal spray Place 2 sprays into both nostrils daily. 16 g 6  . furosemide (LASIX) 20 MG tablet Take 1 tablet (20 mg total) by mouth daily. 90 tablet 6  . omeprazole (PRILOSEC) 40 MG capsule Take 1 capsule (40 mg total) by mouth daily. 90 capsule 3  . OVER THE  COUNTER MEDICATION VIT C , VIT D, Zinc, MVI 50 plus - all one a day    . Spacer/Aero-Holding Chambers (AEROCHAMBER MV) inhaler Use as instructed 1 each 0  . spironolactone (ALDACTONE) 25 MG tablet Take 1 tablet (25 mg total) by mouth daily. 90 tablet 1  . FLUoxetine (PROZAC) 20 MG capsule Take 1 capsule (20 mg total) by mouth daily. 90 capsule 3  . losartan-hydrochlorothiazide (HYZAAR) 100-25 MG tablet Take 1 tablet by mouth daily. 90 tablet 2   No facility-administered medications prior to visit.      ROS Review of Systems  Skin: Positive for color change.       Skin tag  All other systems reviewed and are negative.     Objective:    Physical Exam Vitals reviewed.  HENT:     Head: Normocephalic.  Eyes:     Conjunctiva/sclera: Conjunctivae normal.  Cardiovascular:     Rate and Rhythm: Normal rate.     Pulses: Normal pulses.  Abdominal:     General: Bowel sounds are normal.  Musculoskeletal:        General: Normal range of motion.     Cervical back: Normal range of motion.  Skin:    Findings: Erythema present.     Comments: Skin tag  Neurological:     Mental Status: He is alert and oriented to person, place, and time.  Psychiatric:        Mood and Affect: Mood normal.     BP (!) 105/58   Pulse 87   Temp (!) 97.4 F (36.3 C)   Ht 5\' 10"  (1.778 m)   Wt 290 lb 9.6 oz (131.8 kg)   SpO2 95%   BMI 41.70 kg/m  Wt Readings from Last 3 Encounters:  09/03/20 290 lb 9.6 oz (131.8 kg)  08/13/20 289 lb 6.4 oz (131.3 kg)  05/16/20 284 lb (128.8 kg)     There are no preventive care reminders to display for this patient.    Lab Results  Component Value Date   TSH 0.662 01/03/2020   Lab Results  Component Value Date   WBC 7.9 11/16/2019   HGB 15.6 11/16/2019   HCT 47.1 11/16/2019   MCV 87 11/16/2019   PLT 338 11/16/2019   Lab Results  Component Value Date   NA 140 11/16/2019  K 3.9 11/16/2019   CO2 27 11/16/2019   GLUCOSE 107 (H) 11/16/2019   BUN 23  11/16/2019   CREATININE 1.16 11/16/2019   BILITOT 0.3 11/16/2019   ALKPHOS 83 11/16/2019   AST 21 11/16/2019   ALT 29 11/16/2019   PROT 7.3 11/16/2019   ALBUMIN 4.8 11/16/2019   CALCIUM 10.1 11/16/2019   Lab Results  Component Value Date   CHOL 125 11/16/2019   Lab Results  Component Value Date   HDL 48 11/16/2019   Lab Results  Component Value Date   LDLCALC 66 11/16/2019   Lab Results  Component Value Date   TRIG 49 11/16/2019   Lab Results  Component Value Date   CHOLHDL 2.6 11/16/2019   No results found for: HGBA1C    Assessment & Plan:   Problem List Items Addressed This Visit      Musculoskeletal and Integument   Skin tag - Primary    Patient's skin tag is unchanged in the last 3 weeks.  Patient is not reporting worsening symptoms. Redness remains unchanged, no signs or symptoms of bleeding or infection.  Provided education with printed handouts given to patient.  Referral to dermatology completed  Follow-up with worsening or unresolved symptoms.      Relevant Orders   Ambulatory referral to Dermatology    Other Visit Diagnoses    Need for immunization against influenza       Relevant Orders   Flu Vaccine QUAD 36+ mos IM (Completed)       Follow-up: Return if symptoms worsen or fail to improve.    Daryll Drown, NP

## 2020-10-19 ENCOUNTER — Other Ambulatory Visit: Payer: Self-pay | Admitting: *Deleted

## 2020-10-19 DIAGNOSIS — I10 Essential (primary) hypertension: Secondary | ICD-10-CM

## 2020-10-19 MED ORDER — FUROSEMIDE 20 MG PO TABS: 20.0000 mg | ORAL_TABLET | Freq: Every day | ORAL | 3 refills | Status: DC

## 2020-11-03 DIAGNOSIS — G4733 Obstructive sleep apnea (adult) (pediatric): Secondary | ICD-10-CM

## 2020-11-05 NOTE — Telephone Encounter (Signed)
01/06/2020  Patient was last seen by me in January/2021.  He had excellent compliance at that point in time.  He should easily be able to obtain supplies until January/2022.  He does need to be scheduled for a appointment in January/2022 with a new sleep MD.  Please ensure the patient is scheduled with a sleep MD (former [facility] pt).  I am okay signing for supplies through January/2022 as patient has been seen within the last year and had excellent compliance.  [name], FNP

## 2020-11-05 NOTE — Telephone Encounter (Signed)
Message was received from Patient this weekend. 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  They sent me a letter stating that the prescription on file for BPAP supplies has expired, they need a new one from you. You may fax to them at [phone]. Any questions Resp. Dept is [phone]. Letter was sent by Terrilee Croak  Also when is my next appointment with you, I feel I need to set up one.  Thank you  Kyle Burns   Message routed to [facility]

## 2020-11-12 ENCOUNTER — Other Ambulatory Visit: Payer: Self-pay | Admitting: Family Medicine

## 2020-11-12 DIAGNOSIS — E782 Mixed hyperlipidemia: Secondary | ICD-10-CM

## 2020-11-13 ENCOUNTER — Other Ambulatory Visit: Payer: Self-pay

## 2020-11-13 ENCOUNTER — Encounter: Payer: Self-pay | Admitting: Pulmonary Disease

## 2020-11-13 ENCOUNTER — Ambulatory Visit (INDEPENDENT_AMBULATORY_CARE_PROVIDER_SITE_OTHER): Payer: PRIVATE HEALTH INSURANCE

## 2020-11-13 ENCOUNTER — Ambulatory Visit: Payer: PRIVATE HEALTH INSURANCE | Admitting: Pulmonary Disease

## 2020-11-13 VITALS — BP 120/78 | HR 90 | Temp 97.6°F | Ht 71.0 in | Wt 297.6 lb

## 2020-11-13 DIAGNOSIS — R0602 Shortness of breath: Secondary | ICD-10-CM | POA: Diagnosis not present

## 2020-11-13 DIAGNOSIS — G4733 Obstructive sleep apnea (adult) (pediatric): Secondary | ICD-10-CM | POA: Diagnosis not present

## 2020-11-13 LAB — COMPREHENSIVE METABOLIC PANEL
ALT: 40 U/L (ref 0–53)
AST: 26 U/L (ref 0–37)
Albumin: 4.7 g/dL (ref 3.5–5.2)
Alkaline Phosphatase: 86 U/L (ref 39–117)
BUN: 19 mg/dL (ref 6–23)
CO2: 32 mEq/L (ref 19–32)
Calcium: 9.8 mg/dL (ref 8.4–10.5)
Chloride: 101 mEq/L (ref 96–112)
Creatinine, Ser: 1.07 mg/dL (ref 0.40–1.50)
GFR: 77.7 mL/min (ref >60.00)
Glucose, Bld: 98 mg/dL (ref 70–99)
Potassium: 3.7 mEq/L (ref 3.5–5.1)
Sodium: 140 mEq/L (ref 135–145)
Total Bilirubin: 0.4 mg/dL (ref 0.2–1.2)
Total Protein: 7.7 g/dL (ref 6.0–8.3)

## 2020-11-13 LAB — PSA: PSA: 0.47 ng/mL (ref 0.10–4.00)

## 2020-11-13 LAB — TSH: TSH: 0.78 u[IU]/mL (ref 0.35–4.50)

## 2020-11-13 LAB — CBC WITH DIFFERENTIAL/PLATELET
Basophils Absolute: 0.1 10*3/uL (ref 0.0–0.1)
Basophils Relative: 0.6 % (ref 0.0–3.0)
Eosinophils Absolute: 0.1 10*3/uL (ref 0.0–0.7)
Eosinophils Relative: 1.1 % (ref 0.0–5.0)
HCT: 44.2 % (ref 39.0–52.0)
Hemoglobin: 14.7 g/dL (ref 13.0–17.0)
Lymphocytes Relative: 26.2 % (ref 12.0–46.0)
Lymphs Abs: 2.6 10*3/uL (ref 0.7–4.0)
MCHC: 33.4 g/dL (ref 30.0–36.0)
MCV: 87.8 fl (ref 78.0–100.0)
Monocytes Absolute: 0.9 10*3/uL (ref 0.1–1.0)
Monocytes Relative: 9.1 % (ref 3.0–12.0)
Neutro Abs: 6.3 10*3/uL (ref 1.4–7.7)
Neutrophils Relative %: 63 % (ref 43.0–77.0)
Platelets: 336 10*3/uL (ref 150.0–400.0)
RBC: 5.03 Mil/uL (ref 4.22–5.81)
RDW: 13.4 % (ref 11.5–15.5)
WBC: 10 10*3/uL (ref 4.0–10.5)

## 2020-11-13 LAB — BRAIN NATRIURETIC PEPTIDE: Pro B Natriuretic peptide (BNP): 46 pg/mL (ref 0.0–100.0)

## 2020-11-13 NOTE — Patient Instructions (Addendum)
You were seen today by Coral Ceo, NP  for:   1. Obstructive sleep apnea  - Desensitization mask fit; Future  We recommend that you continue using your BIPAP daily >>>Keep up the hard work using your device >>> Goal should be wearing this for the entire night that you are sleeping, at least 4 to 6 hours  Remember:   Do not drive or operate heavy machinery if tired or drowsy.   Please notify the supply company and office if you are unable to use your device regularly due to missing supplies or machine being broken.   Work on maintaining a healthy weight and following your recommended nutrition plan   Maintain proper daily exercise and movement   Maintaining proper use of your device can also help improve management of other chronic illnesses such as: Blood pressure, blood sugars, and weight management.   BiPAP/ CPAP Cleaning:  >>>Clean weekly, with Dawn soap, and bottle brush.  Set up to air dry. >>> Wipe mask out daily with wet wipe or towelette    2. Shortness of breath  - DG Chest 2 View; Future - Comp Met (CMET); Future - TSH; Future - PSA; Future - B Nat Peptide; Future - CBC with Differential/Platelet; Future   We recommend today:  Orders Placed This Encounter  Procedures   DG Chest 2 View    Standing Status:   Future    Standing Expiration Date:   03/13/2021    Order Specific Question:   Reason for Exam (SYMPTOM  OR DIAGNOSIS REQUIRED)    Answer:   doe    Order Specific Question:   Preferred imaging location?    Answer:   Internal    Order Specific Question:   Radiology Contrast Protocol - do NOT remove file path    Answer:   [email or web address]   Comp Met (CMET)    Standing Status:   Future    Standing Expiration Date:   11/13/2021   TSH    Standing Status:   Future    Standing Expiration Date:   11/13/2021   PSA    Standing Status:   Future    Standing Expiration Date:   11/13/2021   B Nat  Peptide    Standing Status:   Future    Standing Expiration Date:   11/13/2021   CBC with Differential/Platelet    Standing Status:   Future    Standing Expiration Date:   11/13/2021   Desensitization mask fit    Standing Status:   Future    Standing Expiration Date:   11/13/2021    Scheduling Instructions:     Schedule in jan/2022     With Marita Kansas    Order Specific Question:   Where should this test be performed:    Answer:   [facility]   Orders Placed This Encounter  Procedures   DG Chest 2 View   Comp Met (CMET)   TSH   PSA   B Nat Peptide   CBC with Differential/Platelet   Desensitization mask fit   No orders of the defined types were placed in this encounter.   Follow Up:    Return in about 3 months (around 02/11/2021), or if symptoms worsen or fail to improve, for Follow up with Dr. [name], 30 MINUTE SLOT.   Notification of test results are managed in the following manner: If there are  any recommendations or changes to the  plan of care discussed in office today,  we will contact you and let you know what they are. If you do not hear from [place], then your results are normal and you can view them through your  MyChart account , or a letter will be sent to you. Thank you again for trusting [place] with your care  - Thank you, [facility]    It is flu season:   >>> Best ways to protect herself from the flu: Receive the yearly flu vaccine, practice good hand hygiene washing with soap and also using hand sanitizer when available, eat a nutritious meals, get adequate rest, hydrate appropriately       Please contact the office if your symptoms worsen or you have concerns that you are not improving.   Thank you for choosing [facility] for your healthcare, and for allowing [place] to partner with you on your healthcare journey. I am thankful to be able to provide care to you today.   Wyn Quaker FNP-C

## 2020-11-13 NOTE — Progress Notes (Signed)
@Patient  ID: , male    DOB: 04/18/1966, 54 y.o.   MRN: [id number]  Chief Complaint  Patient presents with  . Follow-up    "winded" quick     Referring provider: [id number], DO  HPI:  54 year old male followed in our office for moderate obstructive sleep apnea, 2015 chest x-ray demonstrated left hemo-diaphragm elevation  Past medical history: Obesity, GERD, high blood pressure, hyperlipidemia Smoking history: Never smoker Maintenance: None  Patient of Dr. 59  11/13/2020  - Visit   54 year old male never smoker current smokeless tobacco user upon our office for moderate effective sleep apnea.  Previously established with Dr. 59.  Needs new outpatient pulmonologist/sleep MD in our practice.  Presenting to our office today as a follow-up visit for refill of supplies.  He is reporting today that he has noticed he is had more shortness of breath lately.  We will discuss and evaluate this.  BiPAP compliance report shows excellent compliance see compliance were listed below:  10/13/2020-11/11/2020-30 had a last 30 days used, 28 of those days greater than 4 hours, average usage 6 hours and 6 minutes, IPAP setting 17, EPAP setting 13, AHI 0.4  Patient feels he has had increased shortness of breath.  He is unsure what may be driving this.  Could be increased weight gain.  He has not had baseline lab work in quite some time.  He has plans obtain baseline lab work with primary care next week for follow-up.  We will discuss this today.  Questionaires / Pulmonary Flowsheets:   ACT:  No flowsheet data found.  MMRC: mMRC Dyspnea Scale mMRC Score  11/13/2020 2  01/11/2020 0    Epworth:  No flowsheet data found.  Tests:   06/04/2018-pulmonary function test- FVC 2.41 (56% predicted), postbronchodilator ratio 89, postbronchodilator FEV1 2.11 (62% predicted), no bronchodilator response, DLCO 62  Polysomnogram showed an AHI of 29. CPAP was titrated to 18 cm of  water. 05/12/2018- CPAP titration study transitioned to BiPAP-and optimal BiPAP pressure was selected for this patient 17/13 cm of water  Imaging:  04/21/2018-chest x-ray- chronic elevation of left hemidiaphragm, no acute findings 09/18/2014-CT chest without contrast- no evidence of thoracic lymphedema or mediastinal mass lesion, elevation of left hemidiaphragm, 3 mm right upper lobe pulmonary nodule  Cardiac:  04/23/2018-echocardiogram- LV ejection fraction 60 to 65%, mild LVH  Labs:  04/21/2018-BNP-13  FENO:  No results found for: NITRICOXIDE  PFT: PFT Results Latest Ref Rng & Units 06/04/2018 04/06/2015 09/18/2014  FVC-Pre L 2.41 2.13 2.31  FVC-Predicted Pre % 56 49 53  FVC-Post L 2.38 2.14 2.20  FVC-Predicted Post % 55 49 50  Pre FEV1/FVC % % 87 86 87  Post FEV1/FCV % % 89 87 89  FEV1-Pre L 2.11 1.83 2.01  FEV1-Predicted Pre % 62 52 57  FEV1-Post L 2.11 1.85 1.96  DLCO uncorrected ml/min/mmHg 21.16 19.79 21.96  DLCO UNC% % 62 58 65  DLCO corrected ml/min/mmHg - - 21.96  DLCO COR %Predicted % - - 65  DLVA Predicted % 118 111 132  TLC L 4.89 3.86 4.85  TLC % Predicted % 68 53 67  RV % Predicted % 78 58 104    WALK:  SIX MIN WALK 06/14/2014  Supplimental Oxygen during Test? (L/min) No    Imaging: No results found.  Lab Results:  CBC    Component Value Date/Time   WBC 7.9 11/16/2019 1040   RBC 5.39 11/16/2019 1040   HGB 15.6 11/16/2019 1040  HCT 47.1 11/16/2019 1040   PLT 338 11/16/2019 1040   MCV 87 11/16/2019 1040   MCH 28.9 11/16/2019 1040   MCHC 33.1 11/16/2019 1040   RDW 12.0 11/16/2019 1040   LYMPHSABS 2.7 11/16/2019 1040   EOSABS 0.1 11/16/2019 1040   BASOSABS 0.1 11/16/2019 1040    BMET    Component Value Date/Time   NA 140 11/16/2019 1040   K 3.9 11/16/2019 1040   CL 101 11/16/2019 1040   CO2 27 11/16/2019 1040   GLUCOSE 107 (H) 11/16/2019 1040   BUN 23 11/16/2019 1040   CREATININE 1.16 11/16/2019 1040   CALCIUM 10.1 11/16/2019 1040   GFRNONAA  70 11/16/2019 1040   GFRAA 81 11/16/2019 1040    BNP No results found for: BNP  ProBNP    Component Value Date/Time   PROBNP 13.0 04/21/2018 1643    Specialty Problems      Pulmonary Problems   Left hemidiaphragm paralysis    No history of trauma 06/2013 sniff test> left hemidiaphragm paralysis 09/2014 PFT> no obstruction, TLC 4.85L (67% pred), DLCO 65% pred 09/2014 CT chest > no mediastinal mass or lymphadenopathy, 69mm RUL nodule April 2016 PFT> ratio normal, FEV1 1.85 L (53% predicted, no change of bronchial dilator), FVC 2.14 L (49% predicted), total lung capacity 3.86 L) 53% predicted), DLCO 19.79 (58% Pred)       Chronic obstructive airway disease with asthma (HCC)   Obstructive sleep apnea    May 2016 polysomnogram AHI 29, CPAP titrated to 18  cm of water, O2 saturation 89% on CPAP. 06/2015 ONO on CPAP < 89% 27 min 05/2016 Download> 89% usage, average time is 4 hours and 30 minutes      Shortness of breath      No Known Allergies  Immunization History  Administered Date(s) Administered  . Influenza Split 10/15/2013, 09/14/2014, 08/30/2015  . Influenza, Quadrivalent, Recombinant, Inj, Pf 09/27/2019  . Influenza,inj,Quad PF,6+ Mos 09/03/2020  . Influenza-Unspecified 10/14/2017  . Moderna SARS-COV2 Booster Vaccination 11/05/2020  . Moderna SARS-COVID-2 Vaccination 03/22/2020, 04/19/2020  . Pneumococcal Polysaccharide-23 12/15/2010  . Tdap 03/20/2016    Past Medical History:  Diagnosis Date  . Asthma    hx of   . Back pain   . Bilevel positive airway pressure (BPAP) dependence   . GERD (gastroesophageal reflux disease)   . HTN (hypertension)   . Hyperlipidemia     Tobacco History: Social History   Tobacco Use  Smoking Status Never Smoker  Smokeless Tobacco Former Neurosurgeon  . Types: Chew  Tobacco Comment   Uses chew "when he has a bad day"   Counseling given: Not Answered Comment: Uses chew "when he has a bad day"   Continue to not  smoke  Outpatient Encounter Medications as of 11/13/2020  Medication Sig  . albuterol (VENTOLIN HFA) 108 (90 Base) MCG/ACT inhaler USE 2 PUFFS EVERY 6 HOURS AS NEEDED FOR WHEEZING (1 for home, 1 for work)  . aspirin 81 MG EC tablet Take 162 mg by mouth daily.   Marland Kitchen atorvastatin (LIPITOR) 10 MG tablet TAKE ONE (1) TABLET EACH DAY  . cetirizine (ZYRTEC) 10 MG tablet Take 10 mg by mouth at bedtime.  . fluticasone (FLONASE) 50 MCG/ACT nasal spray Place 2 sprays into both nostrils daily.  . furosemide (LASIX) 20 MG tablet Take 1 tablet (20 mg total) by mouth daily.  Marland Kitchen omeprazole (PRILOSEC) 40 MG capsule Take 1 capsule (40 mg total) by mouth daily.  Marland Kitchen OVER THE COUNTER MEDICATION VIT  C , VIT D, Zinc, MVI 50 plus - all one a day  . Spacer/Aero-Holding Chambers (AEROCHAMBER MV) inhaler Use as instructed  . budesonide-formoterol (SYMBICORT) 80-4.5 MCG/ACT inhaler Inhale 2 puffs into the lungs 2 (two) times daily. (Patient not taking: Reported on 11/13/2020)  . FLUoxetine (PROZAC) 20 MG capsule Take 1 capsule (20 mg total) by mouth daily.  Marland Kitchen losartan-hydrochlorothiazide (HYZAAR) 100-25 MG tablet Take 1 tablet by mouth daily.  Marland Kitchen spironolactone (ALDACTONE) 25 MG tablet Take 1 tablet (25 mg total) by mouth daily.   No facility-administered encounter medications on file as of 11/13/2020.     Review of Systems  Review of Systems  Constitutional: Negative for activity change, chills, fatigue, fever and unexpected weight change.  HENT: Negative for postnasal drip, rhinorrhea, sinus pressure, sinus pain and sore throat.   Eyes: Negative.   Respiratory: Positive for shortness of breath. Negative for cough and wheezing.   Cardiovascular: Negative for chest pain and palpitations.  Gastrointestinal: Negative for constipation, diarrhea, nausea and vomiting.  Endocrine: Negative.   Genitourinary: Negative.   Musculoskeletal: Negative.   Skin: Negative.   Neurological: Negative for dizziness and headaches.   Psychiatric/Behavioral: Negative.  Negative for dysphoric mood. The patient is not nervous/anxious.   All other systems reviewed and are negative.    Physical Exam  BP 120/78 (BP Location: Left Arm, Cuff Size: Large)   Pulse 90   Temp 97.6 F (36.4 C)   Ht 5\' 11"  (1.803 m)   Wt 297 lb 9.6 oz (135 kg)   SpO2 96%   BMI 41.51 kg/m   Wt Readings from Last 5 Encounters:  11/13/20 297 lb 9.6 oz (135 kg)  09/03/20 290 lb 9.6 oz (131.8 kg)  08/13/20 289 lb 6.4 oz (131.3 kg)  05/16/20 284 lb (128.8 kg)  03/27/20 291 lb (132 kg)    BMI Readings from Last 5 Encounters:  11/13/20 41.51 kg/m  09/03/20 41.70 kg/m  08/13/20 41.52 kg/m  05/16/20 40.75 kg/m  03/27/20 40.59 kg/m     Physical Exam Vitals and nursing note reviewed.  Constitutional:      General: He is not in acute distress.    Appearance: Normal appearance. He is obese.  HENT:     Head: Normocephalic and atraumatic.     Right Ear: Hearing and external ear normal.     Left Ear: Hearing and external ear normal.     Nose: Nose normal. No mucosal edema or rhinorrhea.     Right Turbinates: Not enlarged.     Left Turbinates: Not enlarged.     Mouth/Throat:     Mouth: Mucous membranes are dry.     Pharynx: Oropharynx is clear. No oropharyngeal exudate.  Eyes:     Pupils: Pupils are equal, round, and reactive to light.  Cardiovascular:     Rate and Rhythm: Normal rate and regular rhythm.     Pulses: Normal pulses.     Heart sounds: Normal heart sounds. No murmur heard.   Pulmonary:     Effort: Pulmonary effort is normal.     Breath sounds: Normal breath sounds. No decreased breath sounds, wheezing or rales.  Musculoskeletal:     Cervical back: Normal range of motion.     Right lower leg: No edema.     Left lower leg: No edema.  Lymphadenopathy:     Cervical: No cervical adenopathy.  Skin:    General: Skin is warm and dry.     Capillary Refill: Capillary refill takes  less than 2 seconds.     Findings: No  erythema or rash.  Neurological:     General: No focal deficit present.     Mental Status: He is alert and oriented to person, place, and time.     Motor: No weakness.     Coordination: Coordination normal.     Gait: Gait is intact. Gait normal.  Psychiatric:        Mood and Affect: Mood normal.        Behavior: Behavior normal. Behavior is cooperative.        Thought Content: Thought content normal.        Judgment: Judgment normal.       Assessment & Plan:   Obstructive sleep apnea Plan: Continue BiPAP New mask order to DME  We will coordinate mask fitting and sleep lab with sleep tech Marita KansasVernon  Shortness of breath Plan:  Chest Xray today  Lab work today      Return in about 3 months (around 02/11/2021), or if symptoms worsen or fail to improve, for Follow up with Dr. Wynona Neatlalere, 30 MINUTE SLOT.   Coral CeoBrian P Aryaan Persichetti, NP 11/13/2020   This appointment required 32 minutes of patient care (this includes precharting, chart review, review of results, face-to-face care, etc.).

## 2020-11-13 NOTE — Assessment & Plan Note (Addendum)
Plan: Continue BiPAP New mask order to DME  We will coordinate mask fitting and sleep lab with sleep tech Marita Kansas

## 2020-11-13 NOTE — Assessment & Plan Note (Signed)
Plan:  Chest Xray today  Lab work today

## 2020-11-16 ENCOUNTER — Encounter: Payer: 59 | Admitting: Family Medicine

## 2020-11-19 ENCOUNTER — Other Ambulatory Visit: Payer: Self-pay

## 2020-11-19 ENCOUNTER — Encounter: Payer: Self-pay | Admitting: Family Medicine

## 2020-11-19 ENCOUNTER — Telehealth: Payer: Self-pay

## 2020-11-19 ENCOUNTER — Ambulatory Visit (INDEPENDENT_AMBULATORY_CARE_PROVIDER_SITE_OTHER): Payer: PRIVATE HEALTH INSURANCE | Admitting: Family Medicine

## 2020-11-19 VITALS — BP 128/79 | HR 91 | Temp 97.9°F | Ht 71.0 in | Wt 278.8 lb

## 2020-11-19 DIAGNOSIS — J3489 Other specified disorders of nose and nasal sinuses: Secondary | ICD-10-CM

## 2020-11-19 DIAGNOSIS — Z6841 Body Mass Index (BMI) 40.0 and over, adult: Secondary | ICD-10-CM

## 2020-11-19 DIAGNOSIS — I10 Essential (primary) hypertension: Secondary | ICD-10-CM | POA: Diagnosis not present

## 2020-11-19 DIAGNOSIS — Z0001 Encounter for general adult medical examination with abnormal findings: Secondary | ICD-10-CM

## 2020-11-19 DIAGNOSIS — K429 Umbilical hernia without obstruction or gangrene: Secondary | ICD-10-CM

## 2020-11-19 DIAGNOSIS — E782 Mixed hyperlipidemia: Secondary | ICD-10-CM | POA: Diagnosis not present

## 2020-11-19 DIAGNOSIS — Z0000 Encounter for general adult medical examination without abnormal findings: Secondary | ICD-10-CM

## 2020-11-19 MED ORDER — LOSARTAN POTASSIUM-HCTZ 100-25 MG PO TABS: 1.0000 | ORAL_TABLET | Freq: Every day | ORAL | 3 refills | Status: DC

## 2020-11-19 MED ORDER — MUPIROCIN CALCIUM 2 % EX CREA: TOPICAL_CREAM | CUTANEOUS | 0 refills | Status: DC

## 2020-11-19 MED ORDER — ATORVASTATIN CALCIUM 10 MG PO TABS: ORAL_TABLET | ORAL | 3 refills | Status: DC

## 2020-11-19 NOTE — Progress Notes (Signed)
Kyle Burns is a 54 y.o. male presents to office today for annual physical exam examination.    Concerns today include: 1.  Nasal lesions Patient reports that since he switched his CPAP machine mask he has been developing these small nasal lesions exteriorly.  They are red and slightly irritated.  No treatments thus far but has had similar lesions in the past that required treatment.  Occupation: Technical sales engineer  Diet: Fair, Exercise: Tries to stay active  Past Medical History:  Diagnosis Date  . Asthma    hx of   . Back pain   . Bilevel positive airway pressure (BPAP) dependence   . GERD (gastroesophageal reflux disease)   . HTN (hypertension)   . Hyperlipidemia    Social History   Socioeconomic History  . Marital status: Single    Spouse name: Not on file  . Number of children: 0  . Years of education: Not on file  . Highest education level: Not on file  Occupational History  . Not on file  Tobacco Use  . Smoking status: Never Smoker  . Smokeless tobacco: Former Neurosurgeon    Types: Chew  . Tobacco comment: Uses chew "when he has a bad day"  Vaping Use  . Vaping Use: Never used  Substance and Sexual Activity  . Alcohol use: No    Comment: Socially  . Drug use: No  . Sexual activity: Not on file  Other Topics Concern  . Not on file  Social History Narrative  . Not on file   Social Determinants of Health   Financial Resource Strain:   . Difficulty of Paying Living Expenses: Not on file  Food Insecurity:   . Worried About Programme researcher, broadcasting/film/video in the Last Year: Not on file  . Ran Out of Food in the Last Year: Not on file  Transportation Needs:   . Lack of Transportation (Medical): Not on file  . Lack of Transportation (Non-Medical): Not on file  Physical Activity:   . Days of Exercise per Week: Not on file  . Minutes of Exercise per Session: Not on file  Stress:   . Feeling of Stress : Not on file  Social Connections:   . Frequency of Communication with Friends  and Family: Not on file  . Frequency of Social Gatherings with Friends and Family: Not on file  . Attends Religious Services: Not on file  . Active Member of Clubs or Organizations: Not on file  . Attends Banker Meetings: Not on file  . Marital Status: Not on file  Intimate Partner Violence:   . Fear of Current or Ex-Partner: Not on file  . Emotionally Abused: Not on file  . Physically Abused: Not on file  . Sexually Abused: Not on file   Past Surgical History:  Procedure Laterality Date  . bilateral hernia repair     total of 3   . CYST REMOVAL NECK     age - 20s   Family History  Problem Relation Age of Onset  . Asthma Father   . Cancer Father        stomach  . Arthritis Father   . Asthma Maternal Grandfather   . Diabetes Maternal Grandfather   . Stroke Maternal Grandfather   . Asthma Paternal Grandfather   . Crohn's disease Mother   . Arthritis Mother        back pain   . Diabetes Mother   . Hypertension Sister   .  Multiple sclerosis Brother   . Hypertension Maternal Grandmother     Current Outpatient Medications:  .  albuterol (VENTOLIN HFA) 108 (90 Base) MCG/ACT inhaler, USE 2 PUFFS EVERY 6 HOURS AS NEEDED FOR WHEEZING (1 for home, 1 for work), Disp: 36 g, Rfl: 1 .  aspirin 81 MG EC tablet, Take 162 mg by mouth daily. , Disp: , Rfl:  .  atorvastatin (LIPITOR) 10 MG tablet, TAKE ONE (1) TABLET EACH DAY, Disp: 90 tablet, Rfl: 0 .  budesonide-formoterol (SYMBICORT) 80-4.5 MCG/ACT inhaler, Inhale 2 puffs into the lungs 2 (two) times daily. (Patient not taking: Reported on 11/13/2020), Disp: 1 Inhaler, Rfl: 11 .  cetirizine (ZYRTEC) 10 MG tablet, Take 10 mg by mouth at bedtime., Disp: , Rfl:  .  FLUoxetine (PROZAC) 20 MG capsule, Take 1 capsule (20 mg total) by mouth daily., Disp: 90 capsule, Rfl: 3 .  fluticasone (FLONASE) 50 MCG/ACT nasal spray, Place 2 sprays into both nostrils daily., Disp: 16 g, Rfl: 6 .  furosemide (LASIX) 20 MG tablet, Take 1 tablet  (20 mg total) by mouth daily., Disp: 90 tablet, Rfl: 3 .  losartan-hydrochlorothiazide (HYZAAR) 100-25 MG tablet, Take 1 tablet by mouth daily., Disp: 90 tablet, Rfl: 2 .  omeprazole (PRILOSEC) 40 MG capsule, Take 1 capsule (40 mg total) by mouth daily., Disp: 90 capsule, Rfl: 3 .  OVER THE COUNTER MEDICATION, VIT C , VIT D, Zinc, MVI 50 plus - all one a day, Disp: , Rfl:  .  Spacer/Aero-Holding Chambers (AEROCHAMBER MV) inhaler, Use as instructed, Disp: 1 each, Rfl: 0 .  spironolactone (ALDACTONE) 25 MG tablet, Take 1 tablet (25 mg total) by mouth daily., Disp: 90 tablet, Rfl: 1  No Known Allergies   ROS: Review of Systems Pertinent items noted in HPI and remainder of comprehensive ROS otherwise negative.    Physical exam BP 128/79   Pulse 91   Temp 97.9 F (36.6 C)   Ht 5\' 11"  (1.803 m)   Wt 278 lb 12.8 oz (126.5 kg)   SpO2 94%   BMI 38.88 kg/m  General appearance: alert, cooperative, appears stated age and no distress Head: Normocephalic, without obvious abnormality, atraumatic Eyes: negative findings: lids and lashes normal, conjunctivae and sclerae normal, corneas clear and pupils equal, round, reactive to light and accomodation Ears: normal TM's and external ear canals both ears Nose: Nares normal. Septum midline. Mucosa normal. No drainage or sinus tenderness. Throat: Moist mucous membranes.  Oropharynx without masses Neck: no adenopathy, no carotid bruit, no JVD, supple, symmetrical, trachea midline and thyroid not enlarged, symmetric, no tenderness/mass/nodules Back: symmetric, no curvature. ROM normal. No CVA tenderness. Lungs: clear to auscultation bilaterally Chest wall: no tenderness Heart: regular rate and rhythm, S1, S2 normal, no murmur, click, rub or gallop Abdomen: Obese but nontender, nondistended; he has about a golf ball size umbilical hernia that is easily reduced and nontender Extremities: extremities normal, atraumatic, no cyanosis or edema Pulses: 2+ and  symmetric Skin: He has small areas of erythema and mild inflammation noted along the left base of the nare and right ala.  No no exudates.  No significant tenderness to touch Lymph nodes: Cervical, supraclavicular, and axillary nodes normal. Neurologic: Alert and oriented X 3, normal strength and tone. Normal symmetric reflexes. Normal coordination and gait Psych: Mood stable, speech normal, affect appropriate   Assessment/ Plan: here for annual physical exam.   Annual physical exam  Essential hypertension with goal blood pressure less than 130/80 -  Plan: losartan-hydrochlorothiazide (HYZAAR) 100-25 MG tablet  Class 3 severe obesity due to excess calories with serious comorbidity and body mass index (BMI) of 40.0 to 44.9 in adult [facility]) - Plan: Lipid Panel  Mixed hyperlipidemia - Plan: Lipid Panel, atorvastatin (LIPITOR) 10 MG tablet  Nasal vestibulitis - Plan: mupirocin cream (BACTROBAN) 2 %  Umbilical hernia without obstruction and without gangrene  Blood pressure at goal.  Continue current regimen.  Check fasting labs.  Plan to continue statin  Nasal symptoms are likely secondary to use of new CPAP machine.  They may need to consider switching back to his old fit.  In the meantime I want to empirically treat him with Bactroban topically.  If symptoms worsen for any reason he will come back and be reevaluated.  May need to consider oral antibiotics at that point  He is noted to have umbilical hernia on exam today but this did not show evidence of complication.  We discussed consideration for evaluation by general surgery for intervention of this as he has had issues with hernias in the groin which required surgical intervention previously.  Handout on healthy lifestyle choices, including diet (rich in fruits, vegetables and lean meats and low in salt and simple carbohydrates) and exercise (at least 30 minutes of moderate physical activity daily).  Patient to follow up  in 1 year for annual exam or sooner if needed.  Taym Twist M. Nadine Counts, DO

## 2020-11-19 NOTE — Patient Instructions (Signed)
You had labs performed today.  You will be contacted with the results of the labs once they are available, usually in the next 3 business days for routine lab work.  If you have an active my chart account, they will be released to your MyChart.  If you prefer to have these labs released to you via telephone, please let us know.  If you had a pap smear or biopsy performed, expect to be contacted in about 7-10 days.   Preventive Care 40-56 Years Old, Male Preventive care refers to lifestyle choices and visits with your health care provider that can promote health and wellness. This includes:  A yearly physical exam. This is also called an annual well check.  Regular dental and eye exams.  Immunizations.  Screening for certain conditions.  Healthy lifestyle choices, such as eating a healthy diet, getting regular exercise, not using drugs or products that contain nicotine and tobacco, and limiting alcohol use. What can I expect for my preventive care visit? Physical exam Your health care provider will check:  Height and weight. These may be used to calculate body mass index (BMI), which is a measurement that tells if you are at a healthy weight.  Heart rate and blood pressure.  Your skin for abnormal spots. Counseling Your health care provider may ask you questions about:  Alcohol, tobacco, and drug use.  Emotional well-being.  Home and relationship well-being.  Sexual activity.  Eating habits.  Work and work environment. What immunizations do I need?  Influenza (flu) vaccine  This is recommended every year. Tetanus, diphtheria, and pertussis (Tdap) vaccine  You may need a Td booster every 10 years. Varicella (chickenpox) vaccine  You may need this vaccine if you have not already been vaccinated. Zoster (shingles) vaccine  You may need this after age 60. Measles, mumps, and rubella (MMR) vaccine  You may need at least one dose of MMR if you were born in 1957 or  later. You may also need a second dose. Pneumococcal conjugate (PCV13) vaccine  You may need this if you have certain conditions and were not previously vaccinated. Pneumococcal polysaccharide (PPSV23) vaccine  You may need one or two doses if you smoke cigarettes or if you have certain conditions. Meningococcal conjugate (MenACWY) vaccine  You may need this if you have certain conditions. Hepatitis A vaccine  You may need this if you have certain conditions or if you travel or work in places where you may be exposed to hepatitis A. Hepatitis B vaccine  You may need this if you have certain conditions or if you travel or work in places where you may be exposed to hepatitis B. Haemophilus influenzae type b (Hib) vaccine  You may need this if you have certain risk factors. Human papillomavirus (HPV) vaccine  If recommended by your health care provider, you may need three doses over 6 months. You may receive vaccines as individual doses or as more than one vaccine together in one shot (combination vaccines). Talk with your health care provider about the risks and benefits of combination vaccines. What tests do I need? Blood tests  Lipid and cholesterol levels. These may be checked every 5 years, or more frequently if you are over 56 years old.  Hepatitis C test.  Hepatitis B test. Screening  Lung cancer screening. You may have this screening every year starting at age 56 if you have a 30-pack-year history of smoking and currently smoke or have quit within the past 15 years.    Prostate cancer screening. Recommendations will vary depending on your family history and other risks.  Colorectal cancer screening. All adults should have this screening starting at age 56 and continuing until age 75. Your health care provider may recommend screening at age 45 if you are at increased risk. You will have tests every 1-10 years, depending on your results and the type of screening  test.  Diabetes screening. This is done by checking your blood sugar (glucose) after you have not eaten for a while (fasting). You may have this done every 1-3 years.  Sexually transmitted disease (STD) testing. Follow these instructions at home: Eating and drinking  Eat a diet that includes fresh fruits and vegetables, whole grains, lean protein, and low-fat dairy products.  Take vitamin and mineral supplements as recommended by your health care provider.  Do not drink alcohol if your health care provider tells you not to drink.  If you drink alcohol: ? Limit how much you have to 0-2 drinks a day. ? Be aware of how much alcohol is in your drink. In the [place] one drink equals one 12 oz bottle of beer (355 mL), one 5 oz glass of wine (148 mL), or one 1 oz glass of hard liquor (44 mL). Lifestyle  Take daily care of your teeth and gums.  Stay active. Exercise for at least 30 minutes on 5 or more days each week.  Do not use any products that contain nicotine or tobacco, such as cigarettes, e-cigarettes, and chewing tobacco. If you need help quitting, ask your health care provider.  If you are sexually active, practice safe sex. Use a condom or other form of protection to prevent STIs (sexually transmitted infections).  Talk with your health care provider about taking a low-dose aspirin every day starting at age 56. What's next?  Go to your health care provider once a year for a well check visit.  Ask your health care provider how often you should have your eyes and teeth checked.  Stay up to date on all vaccines. This information is not intended to replace advice given to you by your health care provider. Make sure you discuss any questions you have with your health care provider. Document Revised: 11/25/2018 Document Reviewed: 11/25/2018 Elsevier Patient Education  2020 [employer] Inc[employer]   

## 2020-11-19 NOTE — Telephone Encounter (Signed)
Did not see anything in chart yet was something going to be called in?

## 2020-11-19 NOTE — Telephone Encounter (Signed)
Meds have been sent.

## 2020-11-20 LAB — LIPID PANEL
Chol/HDL Ratio: 3.2 ratio (ref 0.0–5.0)
Cholesterol, Total: 152 mg/dL (ref 100–199)
HDL: 47 mg/dL (ref >39)
LDL Chol Calc (NIH): 93 mg/dL (ref 0–99)
Triglycerides: 57 mg/dL (ref 0–149)
VLDL Cholesterol Cal: 12 mg/dL (ref 5–40)

## 2020-11-20 NOTE — Telephone Encounter (Signed)
Patient aware.

## 2020-11-27 ENCOUNTER — Other Ambulatory Visit: Payer: Self-pay

## 2020-11-27 ENCOUNTER — Ambulatory Visit (HOSPITAL_BASED_OUTPATIENT_CLINIC_OR_DEPARTMENT_OTHER): Payer: PRIVATE HEALTH INSURANCE | Attending: Pulmonary Disease | Admitting: Internal Medicine

## 2020-11-27 DIAGNOSIS — G4733 Obstructive sleep apnea (adult) (pediatric): Secondary | ICD-10-CM

## 2021-01-16 IMAGING — DX DG CHEST 2V
2 series · 2 of 2 positions shown · non-contrast
Comparison: April 21, 2018

CLINICAL DATA: Shortness of breath

EXAM:
CHEST - 2 VIEW

[chest pa]
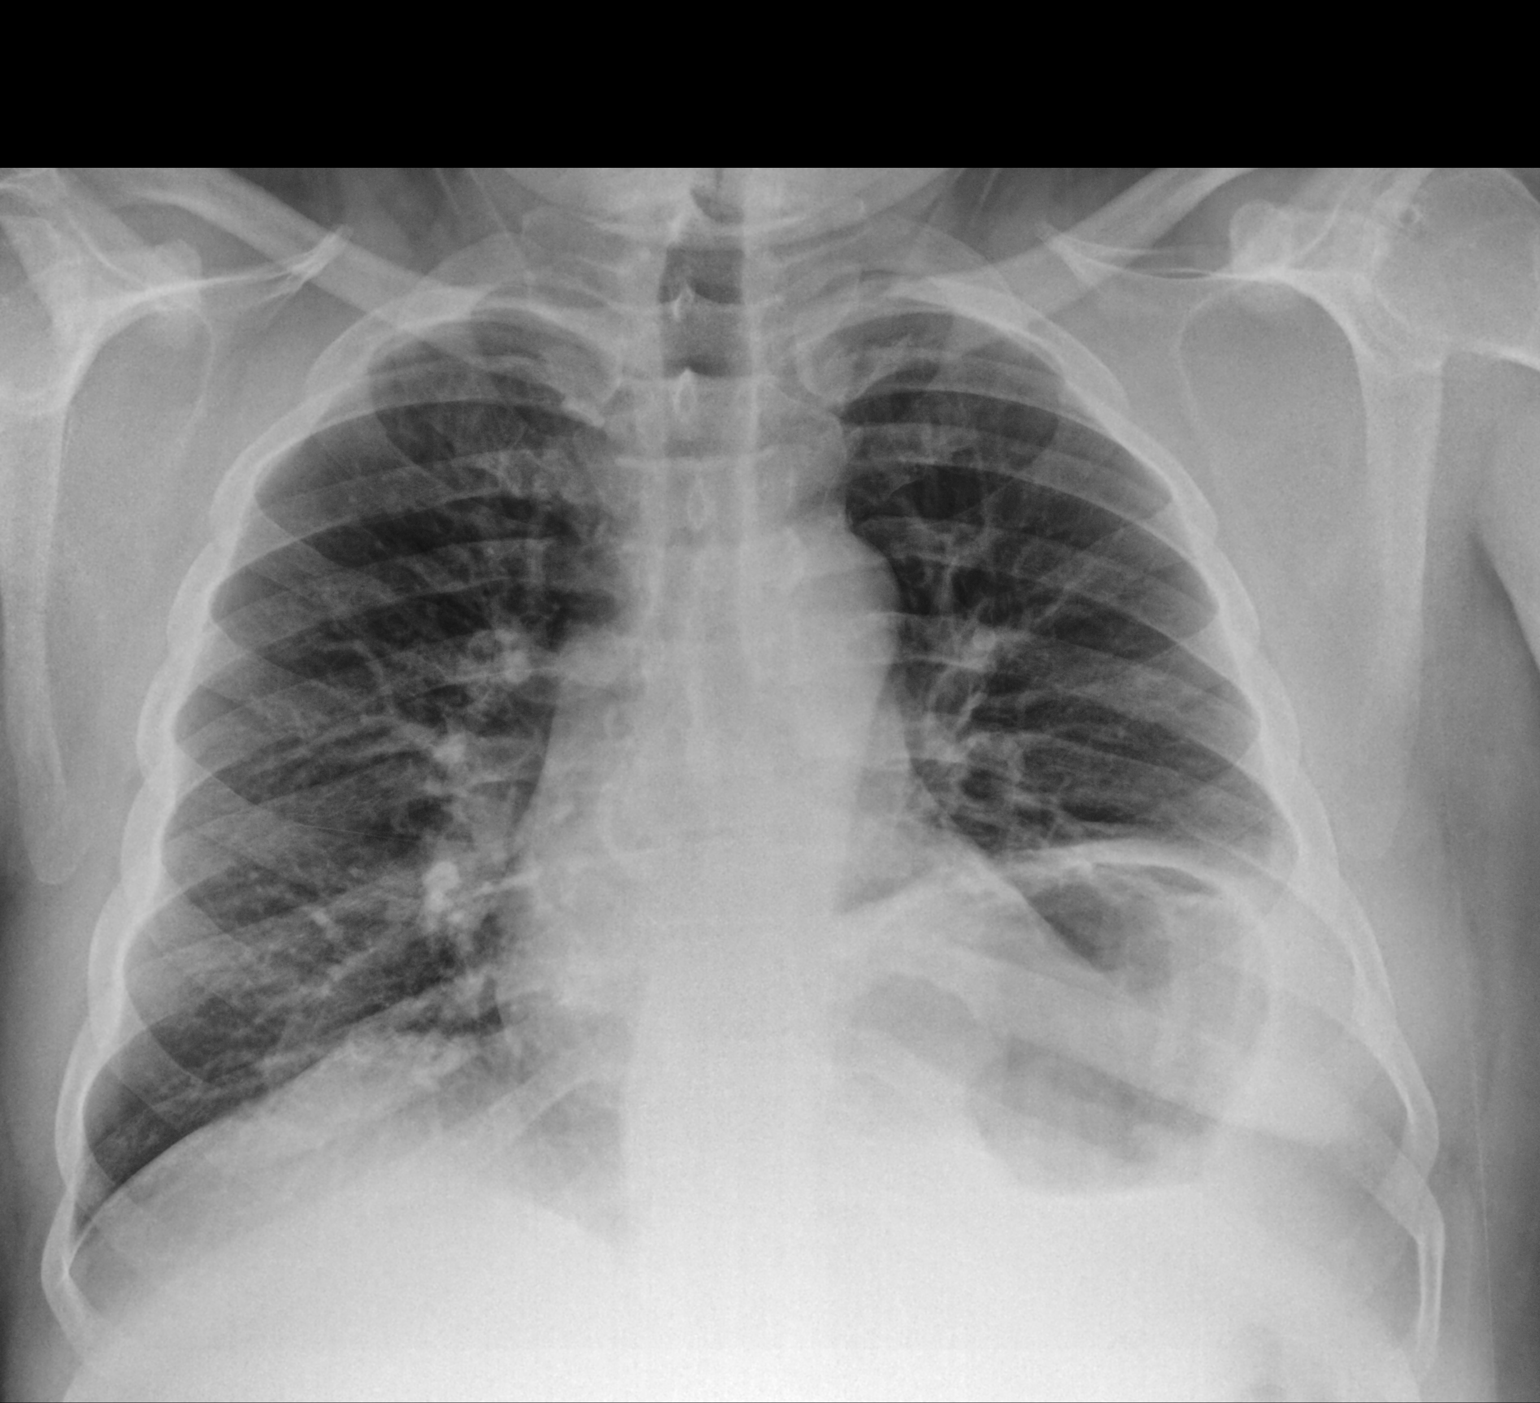

[chest lat]
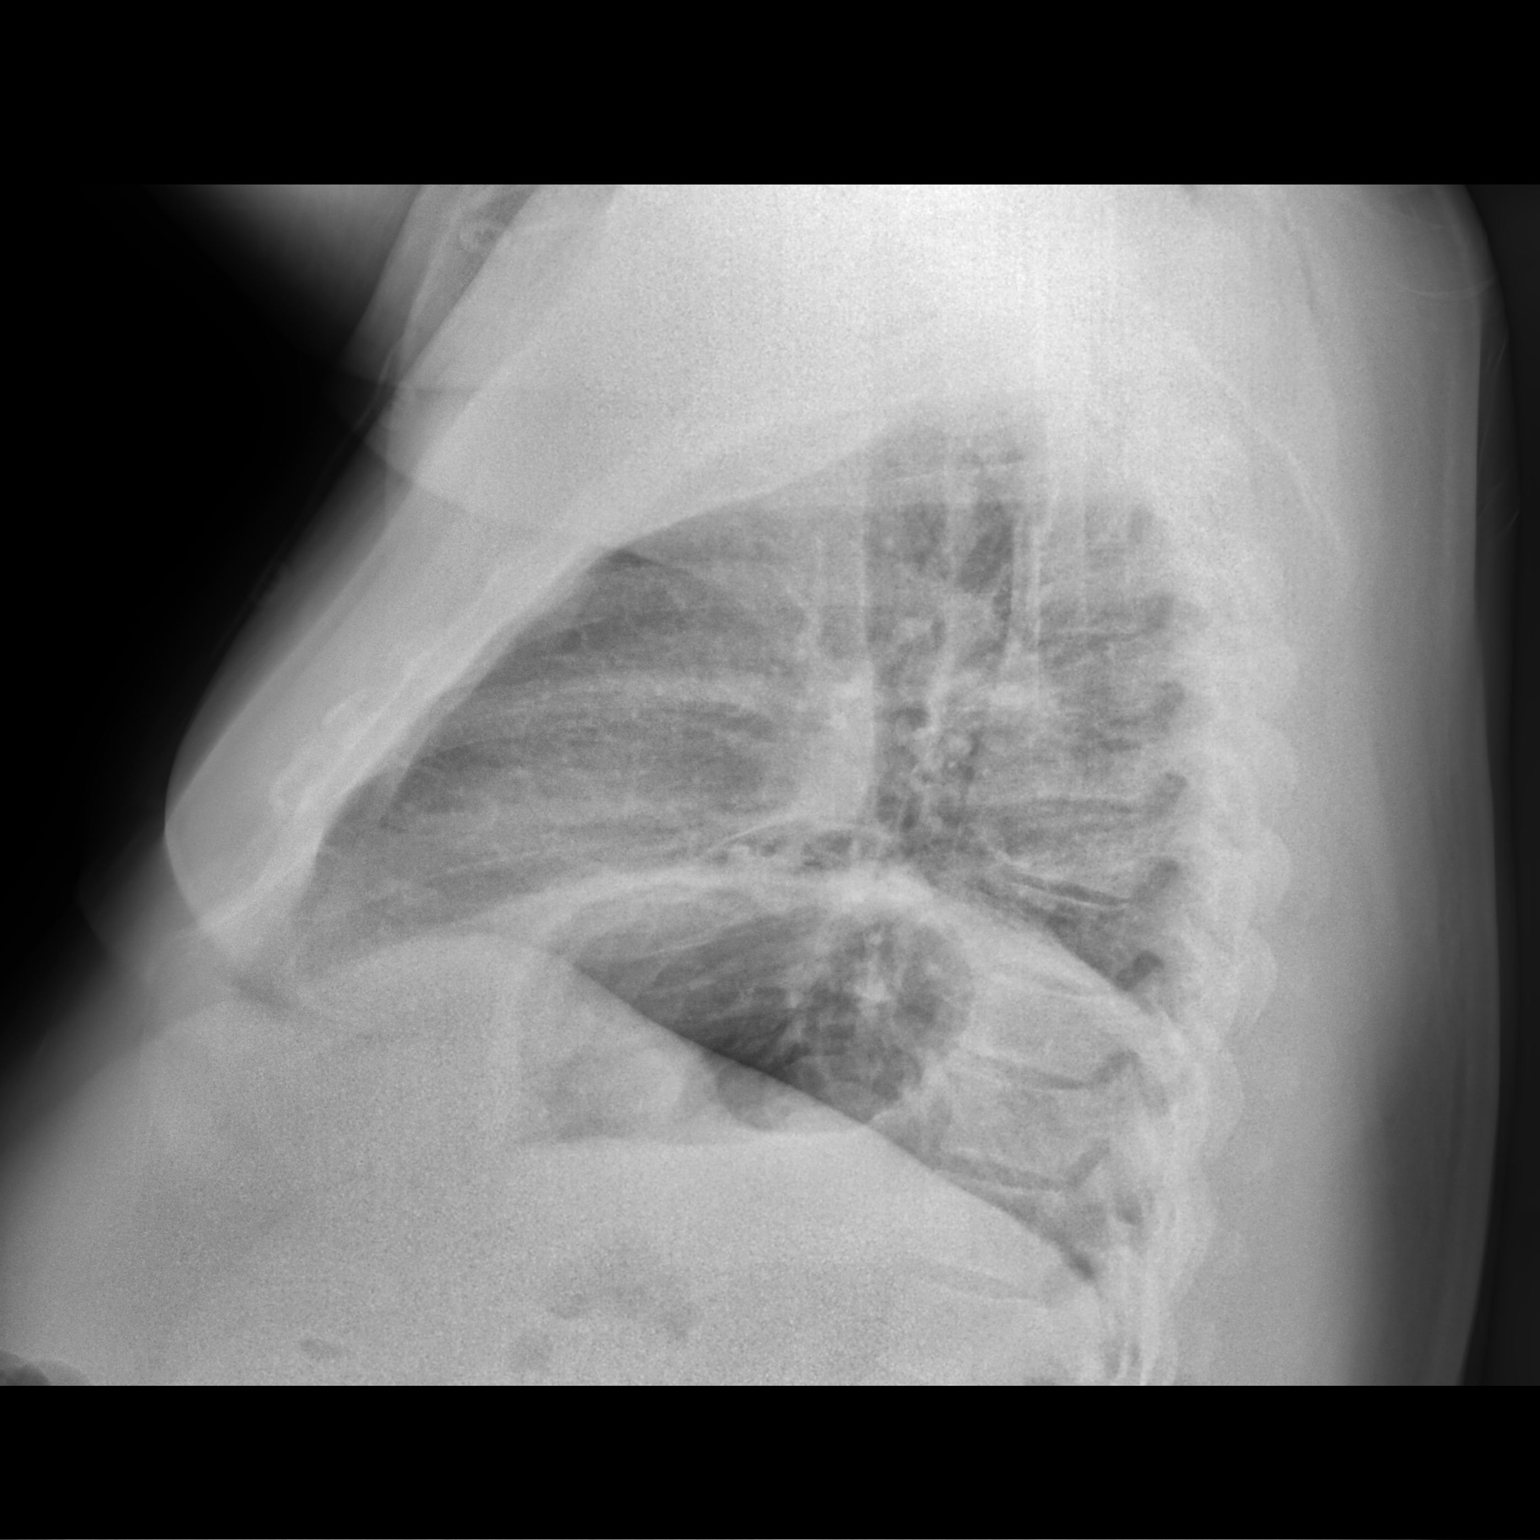

[2 of 2 positions shown; findings below may reference images not displayed]

FINDINGS: There is stable elevation of the left hemidiaphragm. There is mild
left base atelectasis. Lungs elsewhere are clear. Heart size and
pulmonary vascularity are normal. No adenopathy. No bone lesions.
IMPRESSION: Stable elevation left hemidiaphragm with mild left base atelectasis.
Lungs elsewhere clear. Cardiac silhouette normal.

## 2021-01-22 ENCOUNTER — Other Ambulatory Visit: Payer: Self-pay | Admitting: Family Medicine

## 2021-01-22 DIAGNOSIS — I10 Essential (primary) hypertension: Secondary | ICD-10-CM

## 2021-01-28 NOTE — Telephone Encounter (Signed)
Please advise on patient mychart message   I hate doing this through the portal but I have called a few times and the process was long. I need to have mailed to me what I paid in for visits, etc. 2021.

## 2021-02-06 ENCOUNTER — Ambulatory Visit (INDEPENDENT_AMBULATORY_CARE_PROVIDER_SITE_OTHER): Payer: PRIVATE HEALTH INSURANCE

## 2021-02-06 ENCOUNTER — Encounter: Payer: Self-pay | Admitting: Family Medicine

## 2021-02-06 ENCOUNTER — Other Ambulatory Visit: Payer: Self-pay

## 2021-02-06 ENCOUNTER — Ambulatory Visit: Payer: PRIVATE HEALTH INSURANCE | Admitting: Family Medicine

## 2021-02-06 VITALS — BP 129/86 | HR 82 | Temp 98.1°F | Resp 20 | Ht 71.0 in | Wt 294.1 lb

## 2021-02-06 DIAGNOSIS — M25532 Pain in left wrist: Secondary | ICD-10-CM | POA: Diagnosis not present

## 2021-02-06 NOTE — Progress Notes (Signed)
Subjective:  Patient ID: Kyle Burns, male    DOB: April 04, 1966  Age: 55 y.o. MRN: [id number]  CC: Left wrist pain   HPI BRONSYN SHAPPELL presents for wrist pain present for couple of weeks.  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  They recently moved to their facility to the new building.  He was doing a lot of bending and lifting but does not remember any specific injury.  Currently he is having pain in the left wrist.  He is left-handed.  It hurts primarily to dorsiflex the wrist.  He denies numbness.  It does feel weak.  Depression screen [facility] 2/9 02/06/2021 11/19/2020 09/03/2020  Decreased Interest 0 0 0  Down, Depressed, Hopeless 0 0 0  PHQ - 2 Score 0 0 0  Altered sleeping - - -  Tired, decreased energy - - -  Change in appetite - - -  Feeling bad or failure about yourself  - - -  Trouble concentrating - - -  Moving slowly or fidgety/restless - - -  Suicidal thoughts - - -  PHQ-9 Score - - -    History Gurman has a past medical history of Asthma, Back pain, Bilevel positive airway pressure (BPAP) dependence, GERD (gastroesophageal reflux disease), HTN (hypertension), and Hyperlipidemia.   He has a past surgical history that includes bilateral hernia repair and Cyst removal neck.   His family history includes Arthritis in his father and mother; Asthma in his father, maternal grandfather, and paternal grandfather; Cancer in his father; Crohn's disease in his mother; Diabetes in his maternal grandfather and mother; Hypertension in his maternal grandmother and sister; Multiple sclerosis in his brother; Stroke in his maternal grandfather.He reports that he has never smoked. He quit smokeless tobacco use about 15 months ago.  His smokeless tobacco use included chew. He reports that he does not drink alcohol and does not use drugs.    ROS Review of Systems  Constitutional: Negative for chills, diaphoresis and fever.  Respiratory: Negative for shortness of  breath.   Cardiovascular: Negative for chest pain.  Musculoskeletal: Positive for arthralgias. Negative for back pain.  Skin: Negative for rash.  Neurological: Positive for weakness. Negative for numbness.    Objective:  BP 129/86   Pulse 82   Temp 98.1 F (36.7 C) (Temporal)   Resp 20   Ht 5\' 11"  (1.803 m)   Wt 294 lb 2 oz (133.4 kg)   SpO2 98%   BMI 41.02 kg/m   BP Readings from Last 3 Encounters:  02/06/21 129/86  11/19/20 128/79  11/13/20 120/78    Wt Readings from Last 3 Encounters:  02/06/21 294 lb 2 oz (133.4 kg)  11/19/20 278 lb 12.8 oz (126.5 kg)  11/13/20 297 lb 9.6 oz (135 kg)     Physical Exam Vitals reviewed.  Constitutional:      Appearance: He is well-developed and well-nourished.  HENT:     Head: Normocephalic and atraumatic.     Right Ear: External ear normal.     Left Ear: External ear normal.  Eyes:     Pupils: Pupils are equal, round, and reactive to light.  Cardiovascular:     Rate and Rhythm: Normal rate and regular rhythm.     Heart sounds: No murmur heard.   Pulmonary:     Effort: No respiratory distress.     Breath sounds: Normal breath sounds.  Musculoskeletal:        General: Tenderness (  Dorsum of left wrist with decreased dorsiflexion) present.  Neurological:     Mental Status: He is alert and oriented to person, place, and time.       Assessment & Plan:   Denzell was seen today for left wrist pain.  Diagnoses and all orders for this visit:  Wrist pain, acute, left -     DG Wrist Complete Left       I am having Susann Givens E. Tennyson "Homero Fellers" maintain his aspirin, AeroChamber MV, budesonide-formoterol, fluticasone, OVER THE COUNTER MEDICATION, cetirizine, FLUoxetine, albuterol, omeprazole, furosemide, mupirocin cream, losartan-hydrochlorothiazide, atorvastatin, and spironolactone.  Allergies as of 02/06/2021   No Known Allergies     Medication List       Accurate as of February 06, 2021  6:49 PM. If you have any  questions, ask your nurse or doctor.        AeroChamber MV inhaler Use as instructed   albuterol 108 (90 Base) MCG/ACT inhaler Commonly known as: Ventolin HFA USE 2 PUFFS EVERY 6 HOURS AS NEEDED FOR WHEEZING (1 for home, 1 for work)   aspirin 81 MG EC tablet Take 162 mg by mouth daily.   atorvastatin 10 MG tablet Commonly known as: LIPITOR TAKE ONE (1) TABLET EACH DAY   budesonide-formoterol 80-4.5 MCG/ACT inhaler Commonly known as: Symbicort Inhale 2 puffs into the lungs 2 (two) times daily.   cetirizine 10 MG tablet Commonly known as: ZYRTEC Take 10 mg by mouth at bedtime.   FLUoxetine 20 MG capsule Commonly known as: PROZAC Take 1 capsule (20 mg total) by mouth daily.   fluticasone 50 MCG/ACT nasal spray Commonly known as: FLONASE Place 2 sprays into both nostrils daily.   furosemide 20 MG tablet Commonly known as: LASIX Take 1 tablet (20 mg total) by mouth daily.   losartan-hydrochlorothiazide 100-25 MG tablet Commonly known as: HYZAAR Take 1 tablet by mouth daily.   mupirocin cream 2 % Commonly known as: Bactroban Apply to affected areas twice daily for 5 days.   omeprazole 40 MG capsule Commonly known as: PRILOSEC Take 1 capsule (40 mg total) by mouth daily.   OVER THE COUNTER MEDICATION VIT C , VIT D, Zinc, MVI 50 plus - all one a day   spironolactone 25 MG tablet Commonly known as: ALDACTONE TAKE ONE (1) TABLET EACH DAY        Follow-up: Return if symptoms worsen or fail to improve.  Mechele Claude, M.D.

## 2021-02-15 NOTE — Telephone Encounter (Signed)
Patrice, See patient message.

## 2021-02-25 ENCOUNTER — Other Ambulatory Visit: Payer: Self-pay | Admitting: Family Medicine

## 2021-02-25 DIAGNOSIS — F418 Other specified anxiety disorders: Secondary | ICD-10-CM

## 2021-04-10 ENCOUNTER — Other Ambulatory Visit: Payer: Self-pay

## 2021-04-10 ENCOUNTER — Encounter: Payer: Self-pay | Admitting: Family Medicine

## 2021-04-10 ENCOUNTER — Ambulatory Visit: Payer: PRIVATE HEALTH INSURANCE | Admitting: Family Medicine

## 2021-04-10 VITALS — BP 114/77 | HR 81 | Temp 97.6°F | Ht 71.0 in | Wt 295.2 lb

## 2021-04-10 DIAGNOSIS — L249 Irritant contact dermatitis, unspecified cause: Secondary | ICD-10-CM

## 2021-04-10 DIAGNOSIS — K219 Gastro-esophageal reflux disease without esophagitis: Secondary | ICD-10-CM

## 2021-04-10 MED ORDER — ESOMEPRAZOLE MAGNESIUM 40 MG PO CPDR: 40.0000 mg | DELAYED_RELEASE_CAPSULE | Freq: Every day | ORAL | 3 refills | Status: DC

## 2021-04-10 MED ORDER — TRIAMCINOLONE ACETONIDE 0.1 % EX CREA: 1.0000 "application " | TOPICAL_CREAM | Freq: Two times a day (BID) | CUTANEOUS | 0 refills | Status: DC

## 2021-04-10 NOTE — Patient Instructions (Signed)
Ok to use Allegra each morning or Zyrtec each evening.   Triamcinolone cream applied to spots on your legs twice daily for 1 week for itching and inflammation  Omeprazole switched to Nexium.   Insect Bite, Adult An insect bite can make your skin red, itchy, and swollen. Some insects can spread disease to people with a bite. However, most insect bites do not lead to disease, and most are not serious. What are the causes? Insects may bite for many reasons, including:  Hunger.  To defend themselves. Insects that bite include:  Spiders.  Mosquitoes.  Ticks.  Fleas.  Ants.  Flies.  Kissing bugs.  Chiggers. What are the signs or symptoms? Symptoms of this condition include:  Itching or pain in the bite area.  Redness and swelling in the bite area.  An open wound (skin ulcer). Symptoms often last for 2-4 days. In rare cases, a person may have a very bad allergic reaction (anaphylactic reaction) to a bite. Symptoms of an anaphylactic reaction may include:  Feeling warm in the face (flushed). Your face may turn red.  Itchy, red, swollen areas of skin (hives).  Swelling of the: ? Eyes. ? Lips. ? Face. ? Mouth. ? Tongue. ? Throat.  Trouble with any of these: ? Breathing. ? Talking. ? Swallowing.  Loud breathing (wheezing).  Feeling dizzy or light-headed.  Passing out (fainting).  Pain or cramps in your belly.  Throwing up (vomiting).  Watery poop (diarrhea). How is this treated? Treatment is usually not needed. Symptoms often go away on their own. When treatment is needed, it may involve:  Putting a cream or lotion on the bite area. This helps with itching.  Taking an antibiotic medicine. This treatment is needed if the bite area gets infected.  Getting a tetanus shot, if you are not up to date on this vaccine.  Putting ice on the affected area.  Using medicines called antihistamines. This treatment may be needed if you have itching or an allergic  reaction to the insect bite.  Giving yourself a shot of medicine (epinephrine) using an auto-injector "pen" if you have an anaphylactic reaction to a bite. Your doctor will teach you how to use this pen. Follow these instructions at home: Bite area care  Do not scratch the bite area.  Keep the bite area clean and dry.  Wash the bite area every day with soap and water as told by your doctor.  Check the bite area every day for signs of infection. Check for: ? Redness, swelling, or pain. ? Fluid or blood. ? Warmth. ? Pus or a bad smell.   Managing pain, itching, and swelling  You may put any of these on the bite area as told by your doctor: ? A paste made of baking soda and water. ? Cortisone cream. ? Calamine lotion.  If told, put ice on the bite area. ? Put ice in a plastic bag. ? Place a towel between your skin and the bag. ? Leave the ice on for 20 minutes, 2-3 times a day.   General instructions  Apply or take over-the-counter and prescription medicines only as told by your doctor.  If you were prescribed an antibiotic medicine, take or apply it as told by your doctor. Do not stop using the antibiotic even if your condition improves.  Keep all follow-up visits as told by your doctor. This is important. How is this prevented? To help you have a lower risk of insect bites:  When you  are outside, wear clothing that covers your arms and legs.  Use insect repellent. The best insect repellents contain one of these: ? DEET. ? Picaridin. ? Oil of lemon eucalyptus (OLE). ? IR3535.  Consider spraying your clothing with a pesticide called permethrin. Permethrin helps prevent insect bites. It works for several weeks and for up to 5-6 clothing washes. Do not apply permethrin directly to the skin.  If your home windows do not have screens, think about putting some in.  If you will be sleeping in an area where there are mosquitoes, consider covering your sleeping area with a  mosquito net. Contact a doctor if:  You have redness, swelling, or pain in the bite area.  You have fluid or blood coming from the bite area.  The bite area feels warm to the touch.  You have pus or a bad smell coming from the bite area.  You have a fever. Get help right away if:  You have joint pain.  You have a rash.  You feel more tired or sleepy than you normally do.  You have neck pain.  You have a headache.  You feel weaker than you normally do.  You have signs of an anaphylactic reaction. Signs may include: ? Feeling warm in the face. ? Itchy, red, swollen areas of skin. ? Swelling of your:  Eyes.  Lips.  Face.  Mouth.  Tongue.  Throat. ? Trouble with any of these:  Breathing.  Talking.  Swallowing. ? Loud breathing. ? Feeling dizzy or light-headed. ? Passing out. ? Pain or cramps in your belly. ? Throwing up. ? Watery poop. These symptoms may be an emergency. Do not wait to see if the symptoms will go away. Do this right away:  Use your auto-injector pen as you have been told.  Get medical help. Call your local emergency services (911 in the [place] Do not drive yourself to the hospital. Summary  An insect bite can make your skin red, itchy, and swollen.  Treatment is usually not needed. Symptoms often go away on their own.  Do not scratch the bite area. Keep it clean and dry.  Ice can help with pain and itching from the bite. This information is not intended to replace advice given to you by your health care provider. Make sure you discuss any questions you have with your health care provider. Document Revised: 10/23/2020 Document Reviewed: 06/11/2018 Elsevier Patient Education  2021 [employer].

## 2021-04-10 NOTE — Progress Notes (Signed)
Subjective: CC: Rash PCP: Raliegh Ip, DO KKX:FGHWEXHB Kyle Burns is a 55 y.o. male presenting to clinic today for:  1.  Rash Patient reports he developed a rash after putting together a playset outside.  He notes it is itchy and localized to the lower extremities.  No lesions elsewhere.  He thought it might be consistent with chiggers but was not sure.  He takes Allegra daily but again still has itching.  Not using any topicals currently.  No one with lesions similar  2.  GERD Patient would like to try going back on Nexium.  He felt this worked better than the Prilosec.  He denies any vomiting, hematochezia or melena   ROS: Per HPI  No Known Allergies Past Medical History:  Diagnosis Date  . Asthma    hx of   . Back pain   . Bilevel positive airway pressure (BPAP) dependence   . GERD (gastroesophageal reflux disease)   . HTN (hypertension)   . Hyperlipidemia     Current Outpatient Medications:  .  albuterol (VENTOLIN HFA) 108 (90 Base) MCG/ACT inhaler, USE 2 PUFFS EVERY 6 HOURS AS NEEDED FOR WHEEZING (1 for home, 1 for work), Disp: 36 g, Rfl: 1 .  aspirin 81 MG EC tablet, Take 162 mg by mouth daily. , Disp: , Rfl:  .  atorvastatin (LIPITOR) 10 MG tablet, TAKE ONE (1) TABLET EACH DAY, Disp: 90 tablet, Rfl: 3 .  budesonide-formoterol (SYMBICORT) 80-4.5 MCG/ACT inhaler, Inhale 2 puffs into the lungs 2 (two) times daily., Disp: 1 Inhaler, Rfl: 11 .  cetirizine (ZYRTEC) 10 MG tablet, Take 10 mg by mouth at bedtime., Disp: , Rfl:  .  FLUoxetine (PROZAC) 20 MG capsule, TAKE ONE (1) CAPSULE EACH DAY, Disp: 90 capsule, Rfl: 3 .  fluticasone (FLONASE) 50 MCG/ACT nasal spray, Place 2 sprays into both nostrils daily., Disp: 16 g, Rfl: 6 .  furosemide (LASIX) 20 MG tablet, Take 1 tablet (20 mg total) by mouth daily., Disp: 90 tablet, Rfl: 3 .  losartan-hydrochlorothiazide (HYZAAR) 100-25 MG tablet, Take 1 tablet by mouth daily., Disp: 90 tablet, Rfl: 3 .  mupirocin cream (BACTROBAN)  2 %, Apply to affected areas twice daily for 5 days., Disp: 15 g, Rfl: 0 .  omeprazole (PRILOSEC) 40 MG capsule, Take 1 capsule (40 mg total) by mouth daily., Disp: 90 capsule, Rfl: 3 .  OVER THE COUNTER MEDICATION, VIT C , VIT D, Zinc, MVI 50 plus - all one a day, Disp: , Rfl:  .  Spacer/Aero-Holding Chambers (AEROCHAMBER MV) inhaler, Use as instructed, Disp: 1 each, Rfl: 0 .  spironolactone (ALDACTONE) 25 MG tablet, TAKE ONE (1) TABLET EACH DAY, Disp: 90 tablet, Rfl: 1 Social History   Socioeconomic History  . Marital status: Single    Spouse name: Not on file  . Number of children: 0  . Years of education: Not on file  . Highest education level: Not on file  Occupational History  . Not on file  Tobacco Use  . Smoking status: Never Smoker  . Smokeless tobacco: Former Neurosurgeon    Types: Chew  . Tobacco comment: Uses chew "when he has a bad day"  Vaping Use  . Vaping Use: Never used  Substance and Sexual Activity  . Alcohol use: No    Comment: Socially  . Drug use: No  . Sexual activity: Not on file  Other Topics Concern  . Not on file  Social History Narrative  . Not on file  Social Determinants of Health   Financial Resource Strain: Not on file  Food Insecurity: Not on file  Transportation Needs: Not on file  Physical Activity: Not on file  Stress: Not on file  Social Connections: Not on file  Intimate Partner Violence: Not on file   Family History  Problem Relation Age of Onset  . Asthma Father   . Cancer Father        stomach  . Arthritis Father   . Asthma Maternal Grandfather   . Diabetes Maternal Grandfather   . Stroke Maternal Grandfather   . Asthma Paternal Grandfather   . Crohn's disease Mother   . Arthritis Mother        back pain   . Diabetes Mother   . Hypertension Sister   . Multiple sclerosis Brother   . Hypertension Maternal Grandmother     Objective: Office vital signs reviewed. BP 114/77   Pulse 81   Temp 97.6 F (36.4 C)   Ht 5\' 11"   (1.803 m)   Wt 295 lb 3.2 oz (133.9 kg)   SpO2 97%   BMI 41.17 kg/m   Physical Examination:  General: Awake, alert, well nourished, No acute distress Skin: Various maculopapular lesions noted along the bilateral anterior legs.  Some are excoriated.  No evidence of secondary bacterial infection Extremities: Warm, well perfused.  No significant edema appreciated  Assessment/ Plan: 55 y.o. male   Irritant dermatitis - Plan: triamcinolone cream (KENALOG) 0.1 %  Gastroesophageal reflux disease without esophagitis - Plan: esomeprazole (NEXIUM) 40 MG capsule  I agree that this looks to be irritant dermatitis in the setting of chigger.  Continue oral antihistamine each morning and could add supplemental antihistamine like Zyrtec or Xyzal each evening.  Triamcinolone apply topically twice daily for up to 10 days.  Home care instructions reviewed and handout was provided  Switch Prilosec to Nexium.  Follow-up as needed  No orders of the defined types were placed in this encounter.  No orders of the defined types were placed in this encounter.    57, DO [facility] [phone]

## 2021-04-11 IMAGING — DX DG WRIST COMPLETE 3+V*L*
4 series · 4 of 4 positions shown · non-contrast
Comparison: None.

CLINICAL DATA: Pain

EXAM:
LEFT WRIST - COMPLETE 3+ VIEW

[wrist ap (1 of 3)]
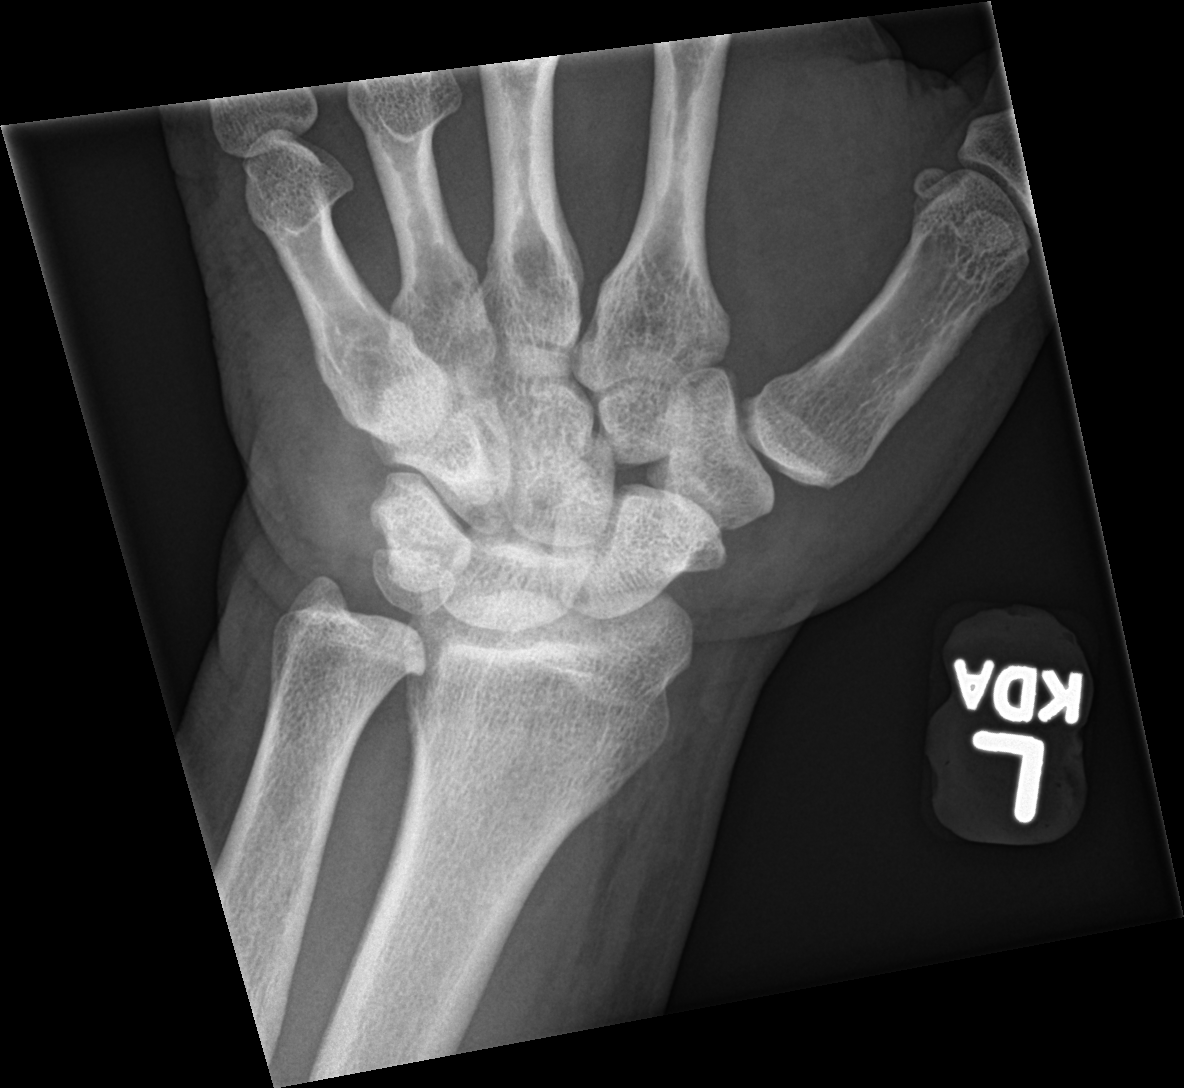

[wrist lat]
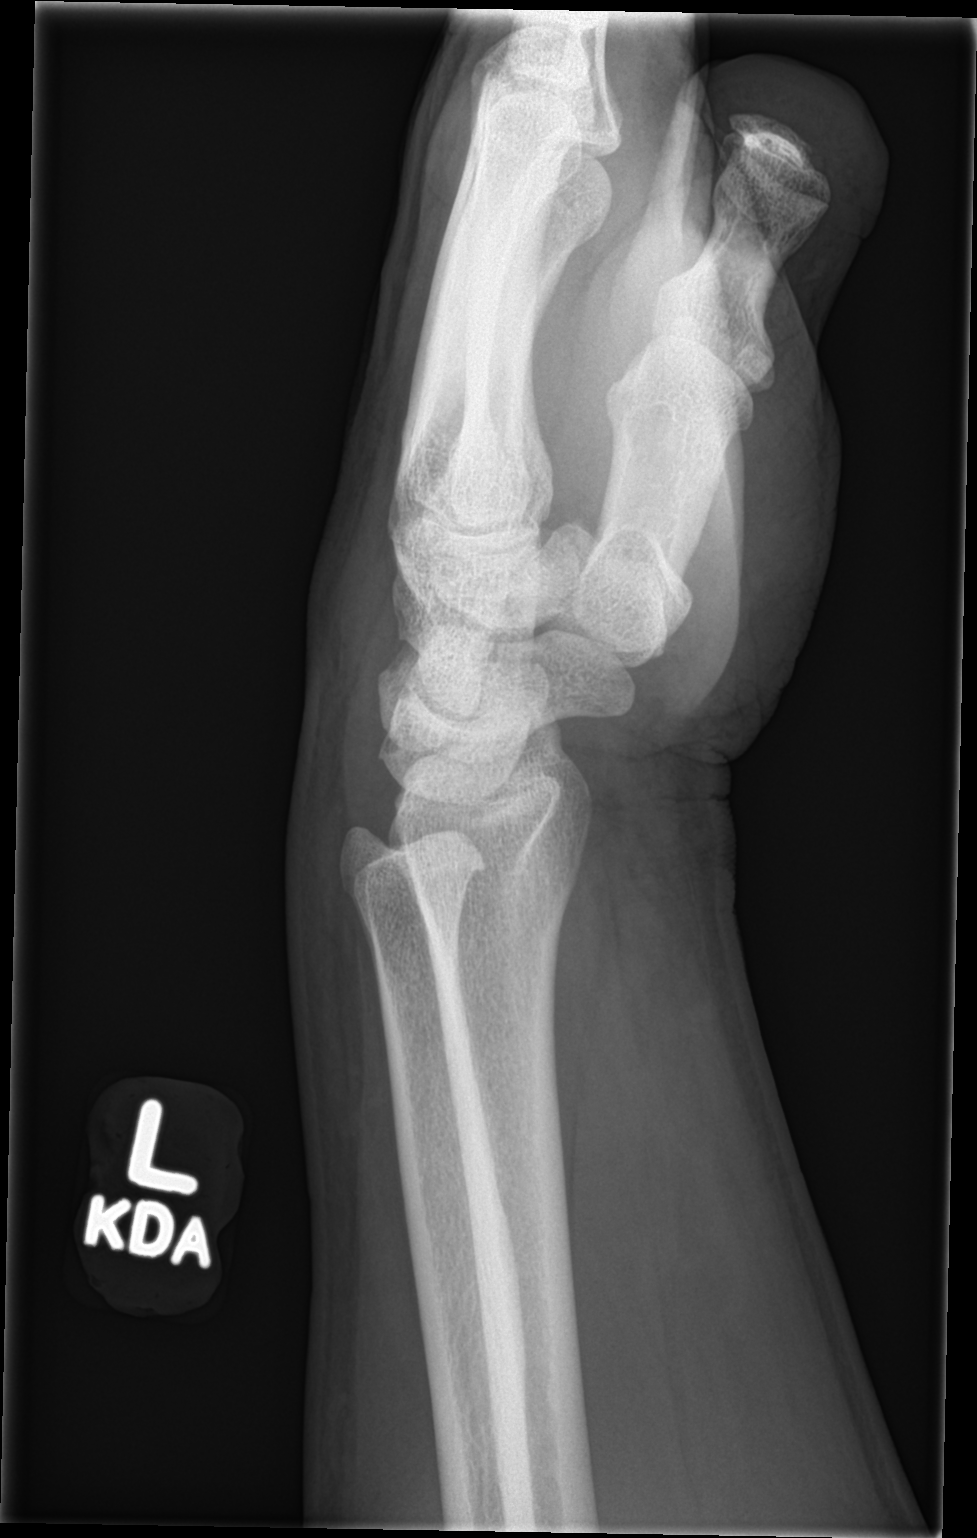

[wrist ap (2 of 3)]
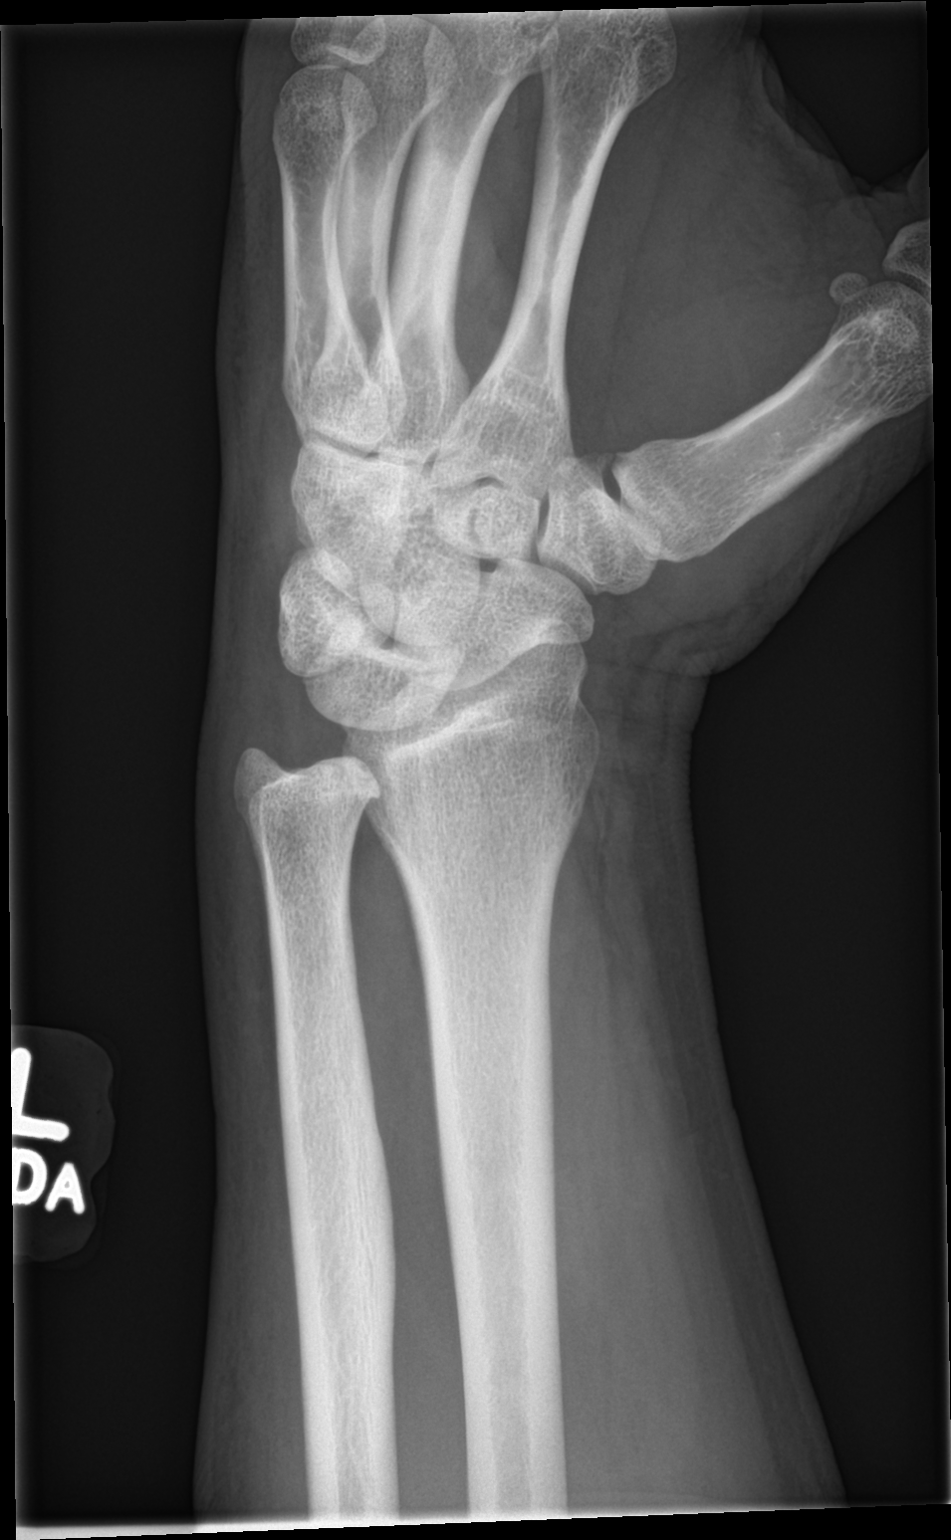

[wrist ap (3 of 3)]
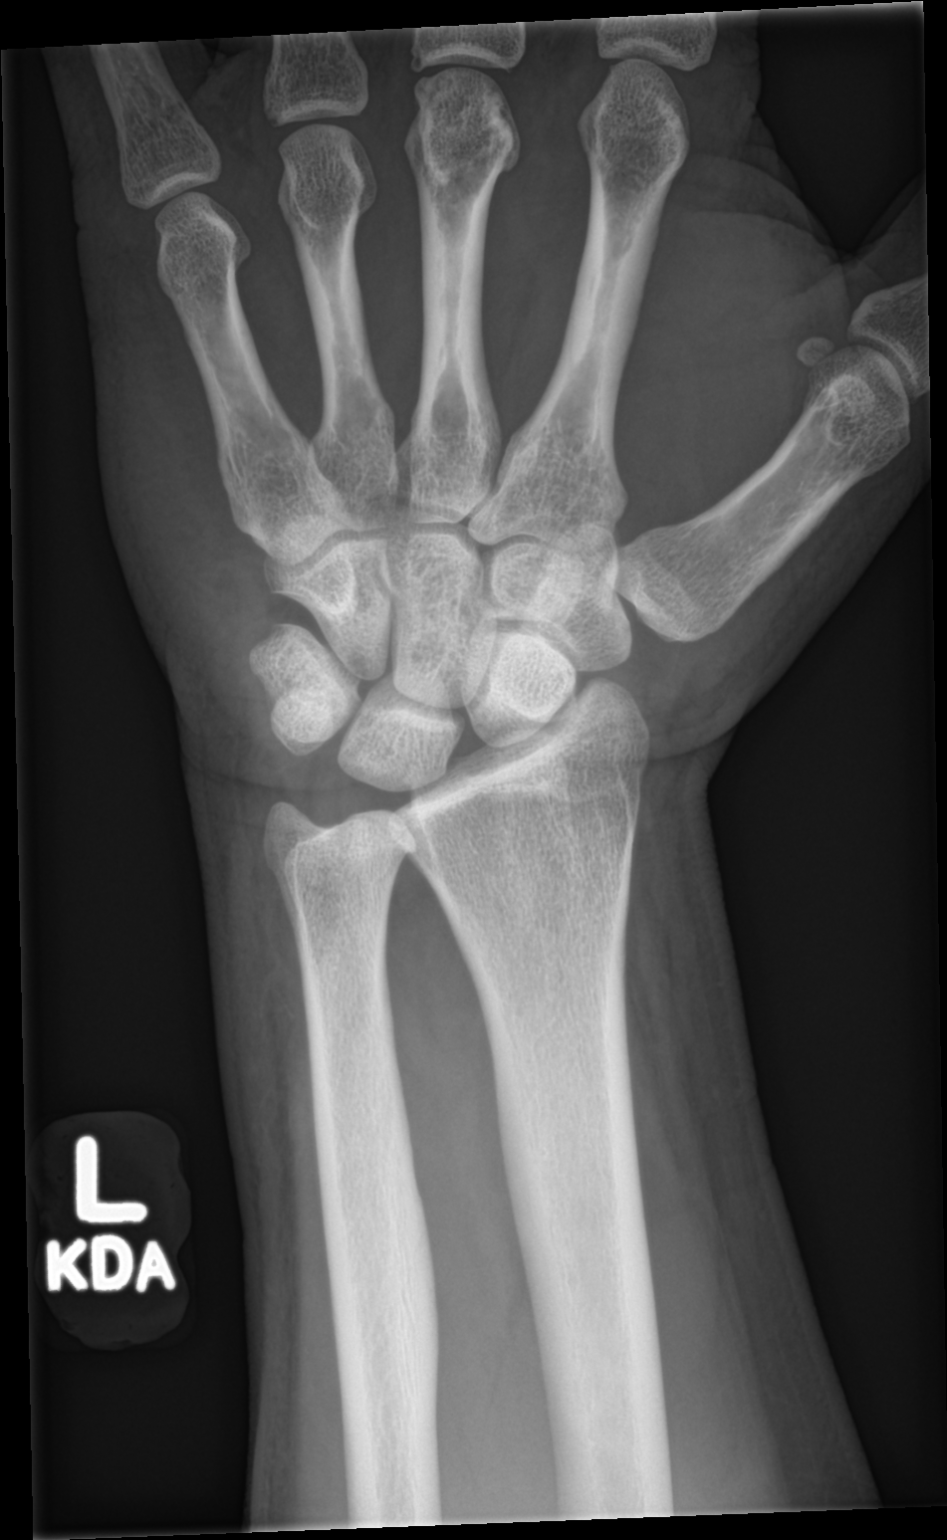

[4 of 4 positions shown; findings below may reference images not displayed]

FINDINGS: Frontal, oblique, lateral, and ulnar deviation scaphoid images were
obtained. No fracture or dislocation. Joint spaces appear normal. No
erosive change.
IMPRESSION: No fracture or dislocation.  No evident arthropathy.

## 2021-04-18 ENCOUNTER — Encounter: Payer: Self-pay | Admitting: Family Medicine

## 2021-07-19 ENCOUNTER — Other Ambulatory Visit: Payer: Self-pay | Admitting: Family Medicine

## 2021-07-19 DIAGNOSIS — I10 Essential (primary) hypertension: Secondary | ICD-10-CM

## 2021-08-27 ENCOUNTER — Other Ambulatory Visit: Payer: Self-pay

## 2021-08-27 ENCOUNTER — Encounter: Payer: Self-pay | Admitting: Nurse Practitioner

## 2021-08-27 ENCOUNTER — Ambulatory Visit: Payer: No Typology Code available for payment source | Admitting: Nurse Practitioner

## 2021-08-27 VITALS — BP 99/63 | HR 71 | Temp 97.7°F | Ht 71.0 in | Wt 281.0 lb

## 2021-08-27 DIAGNOSIS — M25512 Pain in left shoulder: Secondary | ICD-10-CM

## 2021-08-27 DIAGNOSIS — M25511 Pain in right shoulder: Secondary | ICD-10-CM | POA: Diagnosis not present

## 2021-08-27 MED ORDER — CYCLOBENZAPRINE HCL 5 MG PO TABS: 5.0000 mg | ORAL_TABLET | Freq: Three times a day (TID) | ORAL | 1 refills | Status: DC | PRN

## 2021-08-27 MED ORDER — IBUPROFEN 600 MG PO TABS: 600.0000 mg | ORAL_TABLET | Freq: Three times a day (TID) | ORAL | 0 refills | Status: DC | PRN

## 2021-08-27 NOTE — Assessment & Plan Note (Signed)
Unresolved shoulder pain in the past 3 to 4 days.  Patient verbalized helping family member assemble toys and was doing a lot of hand raises and lifting.  Advised patient to rest shoulders, apply warm compress as tolerated, anti-inflammatory ibuprofen 600 mg tablet by mouth as needed.  Advised patient not to take anti-inflammatory at the same time with antidepressants.  Hold aspirin while on anti-inflammatory due to risk of bleeding.  Patient verbalized understanding.  Education printed out and given to patient.  Rx sent to pharmacy.  Follow-up with unresolved symptoms.

## 2021-08-27 NOTE — Progress Notes (Signed)
Acute Office Visit  Subjective:    Patient ID: Kyle Burns, male    DOB: Jun 19, 1966, 55 y.o.   MRN: [id number]  Chief Complaint  Patient presents with  . Shoulder Pain    Shoulder Pain  The pain is present in the left shoulder and right shoulder. This is a new problem. The current episode started yesterday (3-4 days). The problem has been unchanged. The pain is at a severity of 4/10. The pain is mild. Pertinent negatives include no fever or joint locking. The symptoms are aggravated by activity.    Past Medical History:  Diagnosis Date  . Asthma    hx of   . Back pain   . Bilevel positive airway pressure (BPAP) dependence   . GERD (gastroesophageal reflux disease)   . HTN (hypertension)   . Hyperlipidemia     Past Surgical History:  Procedure Laterality Date  . bilateral hernia repair     total of 3   . CYST REMOVAL NECK     age - 12s    Family History  Problem Relation Age of Onset  . Asthma Father   . Cancer Father        stomach  . Arthritis Father   . Asthma Maternal Grandfather   . Diabetes Maternal Grandfather   . Stroke Maternal Grandfather   . Asthma Paternal Grandfather   . Crohn's disease Mother   . Arthritis Mother        back pain   . Diabetes Mother   . Hypertension Sister   . Multiple sclerosis Brother   . Hypertension Maternal Grandmother     Social History   Socioeconomic History  . Marital status: Single    Spouse name: Not on file  . Number of children: 0  . Years of education: Not on file  . Highest education level: Not on file  Occupational History  . Not on file  Tobacco Use  . Smoking status: Never  . Smokeless tobacco: Former    Types: Chew    Quit date: 10/2019  . Tobacco comments:    Uses chew "when he has a bad day"  Vaping Use  . Vaping Use: Never used  Substance and Sexual Activity  . Alcohol use: No    Comment: Socially  . Drug use: No  . Sexual activity: Not on file  Other Topics Concern  . Not on file   Social History Narrative  . Not on file   Social Determinants of Health   Financial Resource Strain: Not on file  Food Insecurity: Not on file  Transportation Needs: Not on file  Physical Activity: Not on file  Stress: Not on file  Social Connections: Not on file  Intimate Partner Violence: Not on file    Outpatient Medications Prior to Visit  Medication Sig Dispense Refill  . albuterol (VENTOLIN HFA) 108 (90 Base) MCG/ACT inhaler USE 2 PUFFS EVERY 6 HOURS AS NEEDED FOR WHEEZING (1 for home, 1 for work) 36 g 1  . aspirin 81 MG EC tablet Take 162 mg by mouth daily.     Marland Kitchen atorvastatin (LIPITOR) 10 MG tablet TAKE ONE (1) TABLET EACH DAY 90 tablet 3  . budesonide-formoterol (SYMBICORT) 80-4.5 MCG/ACT inhaler Inhale 2 puffs into the lungs 2 (two) times daily. 1 Inhaler 11  . cetirizine (ZYRTEC) 10 MG tablet Take 10 mg by mouth at bedtime.    Marland Kitchen esomeprazole (NEXIUM) 40 MG capsule Take 1 capsule (40 mg total) by mouth  daily. 90 capsule 3  . FLUoxetine (PROZAC) 20 MG capsule TAKE ONE (1) CAPSULE EACH DAY 90 capsule 3  . fluticasone (FLONASE) 50 MCG/ACT nasal spray Place 2 sprays into both nostrils daily. 16 g 6  . furosemide (LASIX) 20 MG tablet Take 1 tablet (20 mg total) by mouth daily. 90 tablet 3  . losartan-hydrochlorothiazide (HYZAAR) 100-25 MG tablet Take 1 tablet by mouth daily. 90 tablet 3  . OVER THE COUNTER MEDICATION VIT C , VIT D, Zinc, MVI 50 plus - all one a day    . Spacer/Aero-Holding Chambers (AEROCHAMBER MV) inhaler Use as instructed 1 each 0  . spironolactone (ALDACTONE) 25 MG tablet TAKE ONE (1) TABLET EACH DAY 90 tablet 0  . mupirocin cream (BACTROBAN) 2 % Apply to affected areas twice daily for 5 days. (Patient not taking: No sig reported) 15 g 0  . triamcinolone cream (KENALOG) 0.1 % Apply 1 application topically 2 (two) times daily. x7-10 days for itching/ inflammation 30 g 0   No facility-administered medications prior to visit.    No Known Allergies  Review  of Systems  Constitutional:  Negative for fever.  HENT: Negative.    Respiratory: Negative.    Gastrointestinal: Negative.   Musculoskeletal: Negative.   Skin:  Negative for rash.  All other systems reviewed and are negative.     Objective:    Physical Exam Vitals and nursing note reviewed.  Constitutional:      Appearance: Normal appearance.  HENT:     Head: Normocephalic.     Nose: Nose normal.     Mouth/Throat:     Mouth: Mucous membranes are moist.     Pharynx: Oropharynx is clear.  Eyes:     Conjunctiva/sclera: Conjunctivae normal.  Cardiovascular:     Rate and Rhythm: Normal rate and regular rhythm.  Pulmonary:     Effort: Pulmonary effort is normal.     Breath sounds: Normal breath sounds.  Abdominal:     General: Bowel sounds are normal.  Musculoskeletal:     Right shoulder: Tenderness present. Decreased range of motion.     Left shoulder: Tenderness present. Decreased range of motion.  Skin:    Findings: No rash.  Neurological:     Mental Status: He is alert and oriented to person, place, and time.  Psychiatric:        Behavior: Behavior normal.    BP 99/63   Pulse 71   Temp 97.7 F (36.5 C) (Temporal)   Ht 5\' 11"  (1.803 m)   Wt 281 lb (127.5 kg)   BMI 39.19 kg/m  Wt Readings from Last 3 Encounters:  08/27/21 281 lb (127.5 kg)  04/10/21 295 lb 3.2 oz (133.9 kg)  02/06/21 294 lb 2 oz (133.4 kg)    Health Maintenance Due  Topic Date Due  . HIV Screening  Never done  . Zoster Vaccines- Shingrix (1 of 2) Never done  . Pneumococcal Vaccine 4-21 Years old (2 - PCV) 12/16/2011  . COVID-19 Vaccine (4 - Booster for Moderna series) 01/28/2021  . INFLUENZA VACCINE  07/15/2021    There are no preventive care reminders to display for this patient.   Lab Results  Component Value Date   TSH 0.78 11/13/2020   Lab Results  Component Value Date   WBC 10.0 11/13/2020   HGB 14.7 11/13/2020   HCT 44.2 11/13/2020   MCV 87.8 11/13/2020   PLT 336.0  11/13/2020   Lab Results  Component Value Date  NA 140 11/13/2020   K 3.7 11/13/2020   CO2 32 11/13/2020   GLUCOSE 98 11/13/2020   BUN 19 11/13/2020   CREATININE 1.07 11/13/2020   BILITOT 0.4 11/13/2020   ALKPHOS 86 11/13/2020   AST 26 11/13/2020   ALT 40 11/13/2020   PROT 7.7 11/13/2020   ALBUMIN 4.7 11/13/2020   CALCIUM 9.8 11/13/2020   GFR 77.70 11/13/2020   Lab Results  Component Value Date   CHOL 152 11/19/2020   Lab Results  Component Value Date   HDL 47 11/19/2020   Lab Results  Component Value Date   LDLCALC 93 11/19/2020   Lab Results  Component Value Date   TRIG 57 11/19/2020   Lab Results  Component Value Date   CHOLHDL 3.2 11/19/2020   No results found for: HGBA1C     Assessment & Plan:   Problem List Items Addressed This Visit       Other   Acute pain of both shoulders - Primary    Unresolved shoulder pain in the past 3 to 4 days.  Patient verbalized helping family member assemble toys and was doing a lot of hand raises and lifting.  Advised patient to rest shoulders, apply warm compress as tolerated, anti-inflammatory ibuprofen 600 mg tablet by mouth as needed.  Advised patient not to take anti-inflammatory at the same time with antidepressants.  Hold aspirin while on anti-inflammatory due to risk of bleeding.  Patient verbalized understanding.  Education printed out and given to patient.  Rx sent to pharmacy.  Follow-up with unresolved symptoms.      Relevant Medications   ibuprofen (ADVIL) 600 MG tablet   cyclobenzaprine (FLEXERIL) 5 MG tablet     Meds ordered this encounter  Medications  . ibuprofen (ADVIL) 600 MG tablet    Sig: Take 1 tablet (600 mg total) by mouth every 8 (eight) hours as needed.    Dispense:  30 tablet    Refill:  0    Order Specific Question:   Supervising Provider    Answer:   Raliegh Ip [[id number]]  . cyclobenzaprine (FLEXERIL) 5 MG tablet    Sig: Take 1 tablet (5 mg total) by mouth 3 (three) times  daily as needed for muscle spasms.    Dispense:  30 tablet    Refill:  1    Order Specific Question:   Supervising Provider    Answer:   Raliegh Ip [[id number]]     Daryll Drown, NP

## 2021-08-27 NOTE — Patient Instructions (Signed)
Shoulder Pain °Many things can cause shoulder pain, including: °An injury to the shoulder. °Overuse of the shoulder. °Arthritis. °The source of the pain can be: °Inflammation. °An injury to the shoulder joint. °An injury to a tendon, ligament, or bone. °Follow these instructions at home: °Pay attention to changes in your symptoms. Let your health care provider know about them. Follow these instructions to relieve your pain. °If you have a sling: °Wear the sling as told by your health care provider. Remove it only as told by your health care provider. °Loosen the sling if your fingers tingle, become numb, or turn cold and blue. °Keep the sling clean. °If the sling is not waterproof: °Do not let it get wet. Remove it to shower or bathe. °Move your arm as little as possible, but keep your hand moving to prevent swelling. °Managing pain, stiffness, and swelling ° °If directed, put ice on the painful area: °Put ice in a plastic bag. °Place a towel between your skin and the bag. °Leave the ice on for 20 minutes, 2-3 times per day. Stop applying ice if it does not help with the pain. °Squeeze a soft ball or a foam pad as much as possible. This helps to keep the shoulder from swelling. It also helps to strengthen the arm. °General instructions °Take over-the-counter and prescription medicines only as told by your health care provider. °Keep all follow-up visits as told by your health care provider. This is important. °Contact a health care provider if: °Your pain gets worse. °Your pain is not relieved with medicines. °New pain develops in your arm, hand, or fingers. °Get help right away if: °Your arm, hand, or fingers: °Tingle. °Become numb. °Become swollen. °Become painful. °Turn white or blue. °Summary °Shoulder pain can be caused by an injury, overuse, or arthritis. °Pay attention to changes in your symptoms. Let your health care provider know about them. °This condition may be treated with a sling, ice, and pain  medicines. °Contact your health care provider if the pain gets worse or new pain develops. Get help right away if your arm, hand, or fingers tingle or become numb, swollen, or painful. °Keep all follow-up visits as told by your health care provider. This is important. °This information is not intended to replace advice given to you by your health care provider. Make sure you discuss any questions you have with your health care provider. °Document Revised: 06/15/2018 Document Reviewed: 06/15/2018 °Elsevier Patient Education © 2022 [employer] ° °

## 2021-10-07 ENCOUNTER — Other Ambulatory Visit: Payer: Self-pay | Admitting: Family Medicine

## 2021-10-07 DIAGNOSIS — I10 Essential (primary) hypertension: Secondary | ICD-10-CM

## 2021-10-16 ENCOUNTER — Other Ambulatory Visit: Payer: Self-pay | Admitting: Family Medicine

## 2021-10-16 DIAGNOSIS — I10 Essential (primary) hypertension: Secondary | ICD-10-CM

## 2021-10-21 ENCOUNTER — Other Ambulatory Visit: Payer: Self-pay | Admitting: Family Medicine

## 2021-10-21 DIAGNOSIS — I10 Essential (primary) hypertension: Secondary | ICD-10-CM

## 2021-10-30 ENCOUNTER — Encounter: Payer: Self-pay | Admitting: Family Medicine

## 2021-11-01 ENCOUNTER — Other Ambulatory Visit: Payer: Self-pay | Admitting: Family Medicine

## 2021-11-01 DIAGNOSIS — E782 Mixed hyperlipidemia: Secondary | ICD-10-CM

## 2021-11-01 DIAGNOSIS — M25511 Pain in right shoulder: Secondary | ICD-10-CM

## 2021-11-01 NOTE — Telephone Encounter (Signed)
Apt scheduled 11/04/2021

## 2021-11-01 NOTE — Telephone Encounter (Signed)
I've placed for him to see Dr Dallas Schimke.  I think he has a visit to our office in the next week or so?

## 2021-11-01 NOTE — Telephone Encounter (Signed)
30 day supply sent  Needs chronic follow up with PCP

## 2021-11-04 ENCOUNTER — Encounter: Payer: Self-pay | Admitting: Family Medicine

## 2021-11-04 ENCOUNTER — Ambulatory Visit: Payer: No Typology Code available for payment source | Admitting: Family Medicine

## 2021-11-04 ENCOUNTER — Other Ambulatory Visit: Payer: Self-pay

## 2021-11-04 ENCOUNTER — Telehealth: Payer: Self-pay | Admitting: Family Medicine

## 2021-11-04 VITALS — BP 109/65 | HR 80 | Temp 98.0°F | Resp 20 | Ht 71.0 in | Wt 292.0 lb

## 2021-11-04 DIAGNOSIS — I10 Essential (primary) hypertension: Secondary | ICD-10-CM | POA: Diagnosis not present

## 2021-11-04 DIAGNOSIS — Z6841 Body Mass Index (BMI) 40.0 and over, adult: Secondary | ICD-10-CM

## 2021-11-04 DIAGNOSIS — Z125 Encounter for screening for malignant neoplasm of prostate: Secondary | ICD-10-CM

## 2021-11-04 DIAGNOSIS — J449 Chronic obstructive pulmonary disease, unspecified: Secondary | ICD-10-CM | POA: Diagnosis not present

## 2021-11-04 DIAGNOSIS — H9312 Tinnitus, left ear: Secondary | ICD-10-CM

## 2021-11-04 DIAGNOSIS — E782 Mixed hyperlipidemia: Secondary | ICD-10-CM

## 2021-11-04 DIAGNOSIS — F418 Other specified anxiety disorders: Secondary | ICD-10-CM

## 2021-11-04 DIAGNOSIS — M25511 Pain in right shoulder: Secondary | ICD-10-CM

## 2021-11-04 DIAGNOSIS — H65193 Other acute nonsuppurative otitis media, bilateral: Secondary | ICD-10-CM

## 2021-11-04 DIAGNOSIS — M25512 Pain in left shoulder: Secondary | ICD-10-CM

## 2021-11-04 DIAGNOSIS — K219 Gastro-esophageal reflux disease without esophagitis: Secondary | ICD-10-CM

## 2021-11-04 MED ORDER — FLUOXETINE HCL 20 MG PO CAPS: ORAL_CAPSULE | ORAL | 3 refills | Status: DC

## 2021-11-04 MED ORDER — DICLOFENAC SODIUM 75 MG PO TBEC: 75.0000 mg | DELAYED_RELEASE_TABLET | Freq: Two times a day (BID) | ORAL | 0 refills | Status: DC | PRN

## 2021-11-04 MED ORDER — LEVOCETIRIZINE DIHYDROCHLORIDE 5 MG PO TABS: 5.0000 mg | ORAL_TABLET | Freq: Every evening | ORAL | 3 refills | Status: DC

## 2021-11-04 MED ORDER — LOSARTAN POTASSIUM-HCTZ 100-25 MG PO TABS: 1.0000 | ORAL_TABLET | Freq: Every day | ORAL | 3 refills | Status: DC

## 2021-11-04 MED ORDER — ESOMEPRAZOLE MAGNESIUM 40 MG PO CPDR: 40.0000 mg | DELAYED_RELEASE_CAPSULE | Freq: Every day | ORAL | 3 refills | Status: DC

## 2021-11-04 MED ORDER — BUDESONIDE-FORMOTEROL FUMARATE 80-4.5 MCG/ACT IN AERO: 2.0000 | INHALATION_SPRAY | Freq: Two times a day (BID) | RESPIRATORY_TRACT | 4 refills | Status: DC

## 2021-11-04 MED ORDER — ATORVASTATIN CALCIUM 10 MG PO TABS: 10.0000 mg | ORAL_TABLET | Freq: Every day | ORAL | 3 refills | Status: DC

## 2021-11-04 MED ORDER — SPIRONOLACTONE 25 MG PO TABS: ORAL_TABLET | ORAL | 3 refills | Status: DC

## 2021-11-04 MED ORDER — FUROSEMIDE 20 MG PO TABS: ORAL_TABLET | ORAL | 3 refills | Status: DC

## 2021-11-04 MED ORDER — FLUTICASONE PROPIONATE 50 MCG/ACT NA SUSP: 2.0000 | Freq: Every day | NASAL | 6 refills | Status: AC

## 2021-11-04 NOTE — Telephone Encounter (Signed)
Arlys John called from [facility] to let us know that they received pts Symbicort Rx but says insurance requires dosage to be listed a certain way. Says if provider sent it in for 3 inhalers then it needs to be listed as 30.6 grams.  Please correct and resend.

## 2021-11-04 NOTE — Patient Instructions (Addendum)
Concern for possible rotator cuff tendinitis vs partial tear given findings on exam. I have replaced the Motrin with Diclofenac.  USE IF NEEDED ONLY  You have prescribed a nonsteroidal anti-inflammatory drug (NSAID) today. This will help with your pain and inflammation. Please do not take any other NSAIDs (ibuprofen/Motrin/Advil, naproxen/Aleve, meloxicam/Mobic, Voltaren/diclofenac). Please make sure to eat a meal when taking this medication.   Caution: If you have a history of acid reflux/indigestion, I recommend that you take an antacid (such as Prilosec, Prevacid) daily while on the NSAID. If you have a history of bleeding disorder, gastric ulcer, are on a blood thinner (like warfarin/Coumadin, Xarelto, Eliquis, etc) please do not take NSAID. If you have ever had a heart attack, you should not take NSAIDs.

## 2021-11-04 NOTE — Progress Notes (Signed)
Subjective: CC: Shoulder pain PCP: Janora Norlander, DO SMO:Kyle Burns is a 55 y.o. male presenting to clinic today for:  1.  Shoulder pain Patient with bilateral shoulder pain.  He initially had a right shoulder injury this summer where he had a pop when wrestling with someone.  However, that injury seemed to improve.  He had an insidious onset of left shoulder pain which she points to the posterior lateral aspect of the shoulder as the primary area of concern.  He notes pain with elevation of the arm beyond 90 degrees.  He has been utilizing OTC NSAIDs but this has not been helpful.  He never started the Flexeril that was prescribed to him.  2.  Hyperlipidemia and hypertension Compliant with medications.  No chest pain, shortness of breath outside of normal reported.  Compliant with Symbicort.  Does not need Ventolin.  Had fasting labs performed this morning  3.  Ear ringing Patient reports some ear ringing/wheezy headedness that occurs intermittently bilaterally.  He is currently treated with Flonase and Zyrtec.  No reports of hearing loss, drainage or fevers.   ROS: Per HPI  No Known Allergies Past Medical History:  Diagnosis Date   Asthma    hx of    Back pain    Bilevel positive airway pressure (BPAP) dependence    GERD (gastroesophageal reflux disease)    HTN (hypertension)    Hyperlipidemia     Current Outpatient Medications:    albuterol (VENTOLIN HFA) 108 (90 Base) MCG/ACT inhaler, USE 2 PUFFS EVERY 6 HOURS AS NEEDED FOR WHEEZING (1 for home, 1 for work), Disp: 36 g, Rfl: 1   aspirin 81 MG EC tablet, Take 162 mg by mouth daily. , Disp: , Rfl:    atorvastatin (LIPITOR) 10 MG tablet, TAKE ONE (1) TABLET EACH DAY, Disp: 30 tablet, Rfl: 0   budesonide-formoterol (SYMBICORT) 80-4.5 MCG/ACT inhaler, Inhale 2 puffs into the lungs 2 (two) times daily., Disp: 1 Inhaler, Rfl: 11   cetirizine (ZYRTEC) 10 MG tablet, Take 10 mg by mouth at bedtime., Disp: , Rfl:     cyclobenzaprine (FLEXERIL) 5 MG tablet, Take 1 tablet (5 mg total) by mouth 3 (three) times daily as needed for muscle spasms., Disp: 30 tablet, Rfl: 1   esomeprazole (NEXIUM) 40 MG capsule, Take 1 capsule (40 mg total) by mouth daily., Disp: 90 capsule, Rfl: 3   FLUoxetine (PROZAC) 20 MG capsule, TAKE ONE (1) CAPSULE EACH DAY, Disp: 90 capsule, Rfl: 3   fluticasone (FLONASE) 50 MCG/ACT nasal spray, Place 2 sprays into both nostrils daily., Disp: 16 g, Rfl: 6   furosemide (LASIX) 20 MG tablet, TAKE ONE (1) TABLET EACH DAY, Disp: 90 tablet, Rfl: 0   ibuprofen (ADVIL) 600 MG tablet, Take 1 tablet (600 mg total) by mouth every 8 (eight) hours as needed., Disp: 30 tablet, Rfl: 0   losartan-hydrochlorothiazide (HYZAAR) 100-25 MG tablet, Take 1 tablet by mouth daily. (NEEDS TO BE SEEN BEFORE NEXT REFILL), Disp: 30 tablet, Rfl: 0   mupirocin cream (BACTROBAN) 2 %, Apply to affected areas twice daily for 5 days. (Patient not taking: No sig reported), Disp: 15 g, Rfl: 0   OVER THE COUNTER MEDICATION, VIT C , VIT D, Zinc, MVI 50 plus - all one a day, Disp: , Rfl:    Spacer/Aero-Holding Chambers (AEROCHAMBER MV) inhaler, Use as instructed, Disp: 1 each, Rfl: 0   spironolactone (ALDACTONE) 25 MG tablet, TAKE ONE (1) TABLET EACH DAY, Disp: 90 tablet, Rfl:  0 Social History   Socioeconomic History   Marital status: Single    Spouse name: Not on file   Number of children: 0   Years of education: Not on file   Highest education level: Not on file  Occupational History   Not on file  Tobacco Use   Smoking status: Never   Smokeless tobacco: Former    Types: Chew    Quit date: 10/2019   Tobacco comments:    Uses chew "when he has a bad day"  Vaping Use   Vaping Use: Never used  Substance and Sexual Activity   Alcohol use: No    Comment: Socially   Drug use: No   Sexual activity: Not on file  Other Topics Concern   Not on file  Social History Narrative   Not on file   Social Determinants of Health    Financial Resource Strain: Not on file  Food Insecurity: Not on file  Transportation Needs: Not on file  Physical Activity: Not on file  Stress: Not on file  Social Connections: Not on file  Intimate Partner Violence: Not on file   Family History  Problem Relation Age of Onset   Asthma Father    Cancer Father        stomach   Arthritis Father    Asthma Maternal Grandfather    Diabetes Maternal Grandfather    Stroke Maternal Grandfather    Asthma Paternal Grandfather    Crohn's disease Mother    Arthritis Mother        back pain    Diabetes Mother    Hypertension Sister    Multiple sclerosis Brother    Hypertension Maternal Grandmother     Objective: Office vital signs reviewed. BP 109/65   Pulse 80   Temp 98 F (36.7 C)   Resp 20   Ht $R'5\' 11"'Of$  (1.803 m)   Wt 292 lb (132.5 kg)   SpO2 95%   BMI 40.73 kg/m   Physical Examination:  General: Awake, alert, well nourished, obese.  No acute distress HEENT: Normal; bilateral TMs with dulled light reflex.  No purulence.  No erythema or bulging. Cardio: regular rate and rhythm, S1S2 heard, no murmurs appreciated Pulm: clear to auscultation bilaterally, no wheezes, rhonchi or rales; normal work of breathing on room air MSK:   Left shoulder: Painful arc sign present.  Pain with empty can testing.  Pain with Hawkins testing.  No palpable deformities. 5/5 upper extremity strength bilaterally Neuro: Light touch and station grossly intact  Assessment/ Plan: 55 y.o. male   Essential hypertension with goal blood pressure less than 130/80 - Plan: furosemide (LASIX) 20 MG tablet, losartan-hydrochlorothiazide (HYZAAR) 100-25 MG tablet, spironolactone (ALDACTONE) 25 MG tablet, CMP14+EGFR, CANCELED: CMP14+EGFR  Class 3 severe obesity due to excess calories with serious comorbidity and body mass index (BMI) of 40.0 to 44.9 in adult ([place]) - Plan: Lipid Panel, TSH, Bayer DCA Hb A1c Waived, CBC with Differential/Platelet, CANCELED:  CMP14+EGFR  Mixed hyperlipidemia - Plan: Lipid Panel, atorvastatin (LIPITOR) 10 MG tablet, CANCELED: CMP14+EGFR  Chronic obstructive airway disease with asthma (HCC) - Plan: DISCONTINUED: budesonide-formoterol (SYMBICORT) 80-4.5 MCG/ACT inhaler  Acute pain of both shoulders - Plan: diclofenac (VOLTAREN) 75 MG EC tablet, CANCELED: CBC with Differential, CANCELED: C-reactive protein, CANCELED: Sedimentation Rate, CANCELED: ANA w/Reflex if Positive  Screening for malignant neoplasm of prostate - Plan: PSA  Gastroesophageal reflux disease without esophagitis - Plan: esomeprazole (NEXIUM) 40 MG capsule  Situational anxiety - Plan: FLUoxetine (PROZAC)  20 MG capsule  Tinnitus of left ear - Plan: fluticasone (FLONASE) 50 MCG/ACT nasal spray  Acute MEE (middle ear effusion), bilateral - Plan: levocetirizine (XYZAL) 5 MG tablet  Blood pressure is controlled.  No changes needed.  Refills have been sent.  Patient came in for fasting labs this morning so we have collected those  Continue statin.  Check fasting lipid  Check TSH, A1c, CMP and lipid panel given obesity  Symbicort renewed.  Did not need albuterol  Am going to switch him off of Motrin and onto Voltaren twice daily as needed until he can be assessed by orthopedics.  I checked in on this referral and Loma Sousa is reaching out to the coordinator for Dr. Amedeo Kinsman' office.  I suspect that he may have a tendinitis of the rotator cuff given pain and weakness with empty can testing.  He also demonstrated pain with Hawkins maneuver but again this was a posterior pain of the shoulder.  PSA collected  Anxiety was not discussed but Prozac needed to be refilled.  He has some evidence of middle ear effusions bilaterally.  Continue Flonase.  Switch Zyrtec to Xyzal.  Orders Placed This Encounter  Procedures   CMP14+EGFR   CBC with Differential   Lipid Panel   TSH   Bayer DCA Hb A1c Waived   PSA   No orders of the defined types were placed in  this encounter.    Janora Norlander, DO [place] [phone]

## 2021-11-04 NOTE — Telephone Encounter (Signed)
Script corrected & resent

## 2021-11-05 LAB — CMP14+EGFR
ALT: 25 IU/L (ref 0–44)
AST: 19 IU/L (ref 0–40)
Albumin/Globulin Ratio: 1.6 (ref 1.2–2.2)
Albumin: 4.6 g/dL (ref 3.8–4.9)
Alkaline Phosphatase: 99 IU/L (ref 44–121)
BUN/Creatinine Ratio: 21 — ABNORMAL HIGH (ref 9–20)
BUN: 23 mg/dL (ref 6–24)
Bilirubin Total: 0.3 mg/dL (ref 0.0–1.2)
CO2: 29 mmol/L (ref 20–29)
Calcium: 9.7 mg/dL (ref 8.7–10.2)
Chloride: 100 mmol/L (ref 96–106)
Creatinine, Ser: 1.1 mg/dL (ref 0.76–1.27)
Globulin, Total: 2.8 g/dL (ref 1.5–4.5)
Glucose: 96 mg/dL (ref 70–99)
Potassium: 3.9 mmol/L (ref 3.5–5.2)
Sodium: 142 mmol/L (ref 134–144)
Total Protein: 7.4 g/dL (ref 6.0–8.5)
eGFR: 78 mL/min/{1.73_m2} (ref >59)

## 2021-11-05 LAB — CBC WITH DIFFERENTIAL/PLATELET
Basophils Absolute: 0 10*3/uL (ref 0.0–0.2)
Basos: 1 %
EOS (ABSOLUTE): 0.2 10*3/uL (ref 0.0–0.4)
Eos: 2 %
Hematocrit: 45.7 % (ref 37.5–51.0)
Hemoglobin: 15.1 g/dL (ref 13.0–17.7)
Immature Grans (Abs): 0 10*3/uL (ref 0.0–0.1)
Immature Granulocytes: 0 %
Lymphocytes Absolute: 2.9 10*3/uL (ref 0.7–3.1)
Lymphs: 39 %
MCH: 28.3 pg (ref 26.6–33.0)
MCHC: 33 g/dL (ref 31.5–35.7)
MCV: 86 fL (ref 79–97)
Monocytes Absolute: 0.7 10*3/uL (ref 0.1–0.9)
Monocytes: 10 %
Neutrophils Absolute: 3.7 10*3/uL (ref 1.4–7.0)
Neutrophils: 48 %
Platelets: 323 10*3/uL (ref 150–450)
RBC: 5.34 x10E6/uL (ref 4.14–5.80)
RDW: 12.2 % (ref 11.6–15.4)
WBC: 7.6 10*3/uL (ref 3.4–10.8)

## 2021-11-05 LAB — LIPID PANEL
Chol/HDL Ratio: 2.8 ratio (ref 0.0–5.0)
Cholesterol, Total: 132 mg/dL (ref 100–199)
HDL: 47 mg/dL (ref >39)
LDL Chol Calc (NIH): 69 mg/dL (ref 0–99)
Triglycerides: 84 mg/dL (ref 0–149)
VLDL Cholesterol Cal: 16 mg/dL (ref 5–40)

## 2021-11-05 LAB — BAYER DCA HB A1C WAIVED: HB A1C (BAYER DCA - WAIVED): 5.7 % — ABNORMAL HIGH (ref 4.8–5.6)

## 2021-11-05 LAB — PSA: Prostate Specific Ag, Serum: 0.3 ng/mL (ref 0.0–4.0)

## 2021-11-05 LAB — TSH: TSH: 1.57 u[IU]/mL (ref 0.450–4.500)

## 2021-11-22 ENCOUNTER — Encounter: Payer: Self-pay | Admitting: Orthopedic Surgery

## 2021-11-22 ENCOUNTER — Other Ambulatory Visit: Payer: Self-pay

## 2021-11-22 ENCOUNTER — Ambulatory Visit: Payer: No Typology Code available for payment source | Admitting: Orthopedic Surgery

## 2021-11-22 ENCOUNTER — Ambulatory Visit: Payer: No Typology Code available for payment source

## 2021-11-22 VITALS — BP 121/83 | HR 89 | Ht 71.0 in | Wt 290.0 lb

## 2021-11-22 DIAGNOSIS — G8929 Other chronic pain: Secondary | ICD-10-CM

## 2021-11-22 DIAGNOSIS — M25512 Pain in left shoulder: Secondary | ICD-10-CM

## 2021-11-22 DIAGNOSIS — M25511 Pain in right shoulder: Secondary | ICD-10-CM

## 2021-11-22 NOTE — Progress Notes (Signed)
New Patient Visit  Assessment: Kyle Burns is a 55 y.o. male with the following: 1. Chronic pain of both shoulders; left worse than right currently  Plan: Patient has excellent strength and range of motion of bilateral shoulders.  Pain is elicited with physical exam of the left shoulder much more easily than the right shoulder.  Based on his mobility, and strength, I have low concern for a significant rotator cuff injury at this time.  Nonetheless, he does have some irritation of both rotator cuffs.  As such, I discussed the possibility of proceeding with NSAIDs as needed.  He states he was given a prescription for diclofenac, and this has been helpful.  I have advised him to take his medication regardless of how he feels for the next 10 days or so.  After that, he can transition to medications as needed.  He can alternate this medication with Tylenol.  It is also safe for him to take Flexeril, but we discussed taking this at nighttime to limit his fatigue.  I have also provided him with some home exercises.  If he continues to have difficulty, we could consider a formal physical therapy consult.  In addition, future visits could include a steroid injection.  All questions were answered, and he is amenable to this plan.   Follow-up: Return if symptoms worsen or fail to improve.  Subjective:  Chief Complaint  Patient presents with   Shoulder Pain    Pt with bilateral shoulder pain for years. Lt has been worse than right but right shoulder has been hurting longer. Pt is a Engineer, structural.     History of Present Illness: Kyle Burns is a 55 y.o. male who has been referred to clinic today by Ronnie Doss, DO for evaluation of bilateral shoulder pain.  He has had right shoulder pain for many years.  No specific injury.  More recently, he has been having pain in the left shoulder.  Once again, no specific injury.  He states he was moving recently, and has had progressively worsening pain  since then.  He is recently started on diclofenac, and this is helped with some of his symptoms.  Pain is worse at night.  Pain radiates distally.   Review of Systems: No fevers or chills No numbness or tingling No chest pain No shortness of breath No bowel or bladder dysfunction No GI distress No headaches   Medical History:  Past Medical History:  Diagnosis Date   Asthma    hx of    Back pain    Bilevel positive airway pressure (BPAP) dependence    GERD (gastroesophageal reflux disease)    HTN (hypertension)    Hyperlipidemia     Past Surgical History:  Procedure Laterality Date   bilateral hernia repair     total of 3    CYST REMOVAL NECK     age - 57s    Family History  Problem Relation Age of Onset   Asthma Father    Cancer Father        stomach   Arthritis Father    Asthma Maternal Grandfather    Diabetes Maternal Grandfather    Stroke Maternal Grandfather    Asthma Paternal Grandfather    Crohn's disease Mother    Arthritis Mother        back pain    Diabetes Mother    Hypertension Sister    Multiple sclerosis Brother    Hypertension Maternal Grandmother    Social  History   Tobacco Use   Smoking status: Never   Smokeless tobacco: Former    Types: Chew    Quit date: 10/2019   Tobacco comments:    Uses chew "when he has a bad day"  Vaping Use   Vaping Use: Never used  Substance Use Topics   Alcohol use: No    Comment: Socially   Drug use: No    No Known Allergies  No outpatient medications have been marked as taking for the 11/22/21 encounter (Office Visit) with Oliver Barre, MD.    Objective: BP 121/83   Pulse 89   Ht 5\' 11"  (1.803 m)   Wt 290 lb (131.5 kg)   BMI 40.45 kg/m   Physical Exam:  General: Alert and oriented. and No acute distress. Gait: Normal gait.  Bilateral shoulders without deformity.  Tenderness to palpation over the anterior left shoulder.  Some tenderness to palpation over the lateral shoulder.  Near full  range of motion bilaterally.  Strength is 5/5 throughout.  Pain in the posterior shoulder with O'Brien's testing.  Fingers are warm and well-perfused.  IMAGING: I personally ordered and reviewed the following images  X-rays of bilateral shoulders were obtained in clinic today.  No acute injuries are noted.  No evidence of proximal humeral migration.  Minimal degenerative changes noted overall.  Glenohumeral joints are reduced.  Impression: Normal-appearing bilateral shoulder x-rays.   New Medications:  No orders of the defined types were placed in this encounter.     , MD  11/22/2021 9:00 PM

## 2021-11-22 NOTE — Patient Instructions (Signed)

## 2021-11-25 ENCOUNTER — Ambulatory Visit (INDEPENDENT_AMBULATORY_CARE_PROVIDER_SITE_OTHER): Payer: No Typology Code available for payment source | Admitting: Nurse Practitioner

## 2021-11-25 ENCOUNTER — Encounter: Payer: Self-pay | Admitting: Nurse Practitioner

## 2021-11-25 VITALS — BP 121/77 | HR 94 | Temp 98.2°F | Resp 20 | Ht 71.0 in | Wt 294.0 lb

## 2021-11-25 DIAGNOSIS — Z0001 Encounter for general adult medical examination with abnormal findings: Secondary | ICD-10-CM | POA: Diagnosis not present

## 2021-11-25 DIAGNOSIS — Z6841 Body Mass Index (BMI) 40.0 and over, adult: Secondary | ICD-10-CM | POA: Diagnosis not present

## 2021-11-25 DIAGNOSIS — E785 Hyperlipidemia, unspecified: Secondary | ICD-10-CM | POA: Diagnosis not present

## 2021-11-25 DIAGNOSIS — Z0000 Encounter for general adult medical examination without abnormal findings: Secondary | ICD-10-CM

## 2021-11-25 NOTE — Progress Notes (Signed)
Kyle Burns  Established Patient Office Visit  Subjective:  Patient ID: Kyle Burns, male    DOB: 1966-07-29  Age: 55 y.o. MRN: [id number]  CC:  Chief Complaint  Patient presents with   Annual Exam    HPI [name] presents for .   Encounter for general adult medical examination without abnormal findings  Physical: Patient's last physical exam was 1 year ago .  Weight: Appropriate for height (BMI less greater 27%) ;  Blood Pressure: Normal (BP less than 120/80) ;  Medical History: Patient history reviewed ; Family history reviewed ;  Allergies Reviewed: No change in current allergies ;  Medications Reviewed: Medications reviewed - no changes ;  Lipids: Normal lipid levels ; Yes Smoking: Life-long non-smoker ; (quit 2 years ago) Physical Activity: Exercises at least 3 times per week ;  Alcohol/Drug Use: Is a non-drinker ; No illicit drug use ;  Patient is not afflicted from Stress Incontinence and Urge Incontinence  Safety: reviewed ; Patient wears a seat belt, has smoke detectors, has carbon monoxide detectors, practices appropriate gun safety, and wears sunscreen with extended sun exposure. Dental Care: biannual cleanings, brushes and flosses daily. Ophthalmology/Optometry: Annual visit.  Hearing loss: none Vision impairments: none   Past Medical History:  Diagnosis Date   Asthma    hx of    Back pain    Bilevel positive airway pressure (BPAP) dependence    GERD (gastroesophageal reflux disease)    HTN (hypertension)    Hyperlipidemia     Past Surgical History:  Procedure Laterality Date   bilateral hernia repair     total of 3    CYST REMOVAL NECK     age - 60s    Family History  Problem Relation Age of Onset   Asthma Father    Cancer Father        stomach   Arthritis Father    Asthma Maternal Grandfather    Diabetes Maternal Grandfather    Stroke Maternal Grandfather    Asthma Paternal Grandfather    Crohn's disease Mother    Arthritis Mother         back pain    Diabetes Mother    Hypertension Sister    Multiple sclerosis Brother    Hypertension Maternal Grandmother     Social History   Socioeconomic History   Marital status: Single    Spouse name: Not on file   Number of children: 0   Years of education: Not on file   Highest education level: Not on file  Occupational History   Not on file  Tobacco Use   Smoking status: Never   Smokeless tobacco: Former    Types: Chew    Quit date: 10/2019   Tobacco comments:    Uses chew "when he has a bad day"  Vaping Use   Vaping Use: Never used  Substance and Sexual Activity   Alcohol use: No    Comment: Socially   Drug use: No   Sexual activity: Not on file  Other Topics Concern   Not on file  Social History Narrative   Not on file   Social Determinants of Health   Financial Resource Strain: Not on file  Food Insecurity: Not on file  Transportation Needs: Not on file  Physical Activity: Not on file  Stress: Not on file  Social Connections: Not on file  Intimate Partner Violence: Not on file    Outpatient Medications Prior to Visit  Medication Sig  Dispense Refill   albuterol (VENTOLIN HFA) 108 (90 Base) MCG/ACT inhaler USE 2 PUFFS EVERY 6 HOURS AS NEEDED FOR WHEEZING (1 for home, 1 for work) 36 g 1   aspirin 81 MG EC tablet Take 162 mg by mouth daily.      atorvastatin (LIPITOR) 10 MG tablet Take 1 tablet (10 mg total) by mouth daily. 90 tablet 3   budesonide-formoterol (SYMBICORT) 80-4.5 MCG/ACT inhaler Inhale 2 puffs into the lungs 2 (two) times daily. 30.6 g 4   diclofenac (VOLTAREN) 75 MG EC tablet Take 1 tablet (75 mg total) by mouth 2 (two) times daily as needed for moderate pain. 60 tablet 0   esomeprazole (NEXIUM) 40 MG capsule Take 1 capsule (40 mg total) by mouth daily. 90 capsule 3   FLUoxetine (PROZAC) 20 MG capsule TAKE ONE (1) CAPSULE EACH DAY 90 capsule 3   fluticasone (FLONASE) 50 MCG/ACT nasal spray Place 2 sprays into both nostrils daily. 16 g 6    furosemide (LASIX) 20 MG tablet TAKE ONE (1) TABLET EACH DAY 90 tablet 3   levocetirizine (XYZAL) 5 MG tablet Take 1 tablet (5 mg total) by mouth every evening. To replace Zyrtec 90 tablet 3   losartan-hydrochlorothiazide (HYZAAR) 100-25 MG tablet Take 1 tablet by mouth daily. 90 tablet 3   OVER THE COUNTER MEDICATION VIT C , VIT D, Zinc, MVI 50 plus - all one a day     Spacer/Aero-Holding Chambers (AEROCHAMBER MV) inhaler Use as instructed 1 each 0   spironolactone (ALDACTONE) 25 MG tablet TAKE ONE (1) TABLET EACH DAY 90 tablet 3   No facility-administered medications prior to visit.    No Known Allergies  ROS Review of Systems  Constitutional: Negative.   HENT: Negative.    Eyes: Negative.   Respiratory: Negative.    Cardiovascular: Negative.   Gastrointestinal: Negative.   Genitourinary: Negative.   Musculoskeletal: Negative.   Skin: Negative.  Negative for rash.  Neurological: Negative.   Hematological: Negative.   Psychiatric/Behavioral: Negative.    All other systems reviewed and are negative.    Objective:    Physical Exam Vitals and nursing note reviewed.  Constitutional:      Appearance: Normal appearance. He is obese.  HENT:     Head: Normocephalic.     Right Ear: Ear canal and external ear normal.     Left Ear: Ear canal and external ear normal.     Nose: Nose normal.     Mouth/Throat:     Mouth: Mucous membranes are moist.     Pharynx: Oropharynx is clear.  Eyes:     Conjunctiva/sclera: Conjunctivae normal.  Cardiovascular:     Rate and Rhythm: Normal rate and regular rhythm.     Pulses: Normal pulses.     Heart sounds: Normal heart sounds.  Pulmonary:     Effort: Pulmonary effort is normal.     Breath sounds: Normal breath sounds.  Abdominal:     General: Bowel sounds are normal.  Musculoskeletal:        General: Normal range of motion.  Skin:    General: Skin is warm.  Neurological:     General: No focal deficit present.     Mental Status: He  is alert and oriented to person, place, and time. Mental status is at baseline.  Psychiatric:        Mood and Affect: Mood normal.        Behavior: Behavior normal.    BP 121/77  Pulse 94   Temp 98.2 F (36.8 C)   Resp 20   Ht $R'5\' 11"'kg$  (1.803 m)   Wt 294 lb (133.4 kg)   SpO2 95%   BMI 41.00 kg/m  Wt Readings from Last 3 Encounters:  11/25/21 294 lb (133.4 kg)  11/22/21 290 lb (131.5 kg)  11/04/21 292 lb (132.5 kg)     Health Maintenance Due  Topic Date Due   HIV Screening  Never done   Zoster Vaccines- Shingrix (1 of 2) Never done   Pneumococcal Vaccine 63-21 Years old (2 - PCV) 12/16/2011   COVID-19 Vaccine (3 - Moderna risk series) 12/03/2020   INFLUENZA VACCINE  07/15/2021    There are no preventive care reminders to display for this patient.  Lab Results  Component Value Date   TSH 1.570 11/04/2021   Lab Results  Component Value Date   WBC 7.6 11/04/2021   HGB 15.1 11/04/2021   HCT 45.7 11/04/2021   MCV 86 11/04/2021   PLT 323 11/04/2021   Lab Results  Component Value Date   NA 142 11/04/2021   K 3.9 11/04/2021   CO2 29 11/04/2021   GLUCOSE 96 11/04/2021   BUN 23 11/04/2021   CREATININE 1.10 11/04/2021   BILITOT 0.3 11/04/2021   ALKPHOS 99 11/04/2021   AST 19 11/04/2021   ALT 25 11/04/2021   PROT 7.4 11/04/2021   ALBUMIN 4.6 11/04/2021   CALCIUM 9.7 11/04/2021   EGFR 78 11/04/2021   GFR 77.70 11/13/2020   Lab Results  Component Value Date   CHOL 132 11/04/2021   Lab Results  Component Value Date   HDL 47 11/04/2021   Lab Results  Component Value Date   LDLCALC 69 11/04/2021   Lab Results  Component Value Date   TRIG 84 11/04/2021   Lab Results  Component Value Date   CHOLHDL 2.8 11/04/2021   Lab Results  Component Value Date   HGBA1C 5.7 (H) 11/04/2021      Assessment & Plan:   Problem List Items Addressed This Visit       Other   Hyperlipidemia    No signs or symptoms of hyperlipidemia.  Continue on low-cholesterol  diet.      Annual physical exam - Primary    Completed annual physical exam with head to toe assessment.  Education provided on health maintenance and preventative care.  Printed handouts given.  Follow-up in 1 week.  Labs completed 10/2021       BMI 40.0-44.9, adult [facility])    Education provided on weight loss healthy maintenance and exercise.  Increase hydration and follow in 3 months. Patient verbalize understanding          No orders of the defined types were placed in this encounter.   Follow-up: Return in about 1 year (around 11/25/2022).    Ivy Lynn, NP

## 2021-11-25 NOTE — Assessment & Plan Note (Signed)
Education provided on weight loss healthy maintenance and exercise.  Increase hydration and follow in 3 months. Patient verbalize understanding

## 2021-11-25 NOTE — Patient Instructions (Signed)

## 2021-11-25 NOTE — Assessment & Plan Note (Signed)
Completed annual physical exam with head to toe assessment.  Education provided on health maintenance and preventative care.  Printed handouts given.  Follow-up in 1 week.  Labs completed 10/2021

## 2021-11-25 NOTE — Assessment & Plan Note (Signed)
No signs or symptoms of hyperlipidemia.  Continue on low-cholesterol diet.

## 2021-11-27 ENCOUNTER — Encounter: Payer: Self-pay | Admitting: Family Medicine

## 2021-12-05 ENCOUNTER — Encounter: Payer: Self-pay | Admitting: Family Medicine

## 2021-12-05 NOTE — Telephone Encounter (Signed)
Avoid caffeine Keep diary of episodes If occurs and does not resolve within 30 minutes need to go to the ED Make appointment  to see PCP for evaluation.

## 2022-02-24 ENCOUNTER — Encounter: Payer: Self-pay | Admitting: Family Medicine

## 2022-02-24 ENCOUNTER — Ambulatory Visit: Payer: No Typology Code available for payment source | Admitting: Family Medicine

## 2022-02-24 VITALS — BP 101/63 | HR 84 | Temp 97.6°F | Ht 71.0 in | Wt 285.6 lb

## 2022-02-24 DIAGNOSIS — J301 Allergic rhinitis due to pollen: Secondary | ICD-10-CM | POA: Diagnosis not present

## 2022-02-24 DIAGNOSIS — R42 Dizziness and giddiness: Secondary | ICD-10-CM

## 2022-02-24 MED ORDER — BETAMETHASONE SOD PHOS & ACET 6 (3-3) MG/ML IJ SUSP
6.0000 mg | Freq: Once | INTRAMUSCULAR | Status: AC
  Administered 2022-02-24: 6 mg via INTRAMUSCULAR

## 2022-02-24 NOTE — Progress Notes (Signed)
? ?Subjective:  ?Patient ID: Kyle Burns, male    DOB: [birth year]  Age: 56 y.o. MRN: [id number] ? ?CC: Dizziness ? ? ?HPI ?Kyle Burns presents for light headed. Onset 5 days ago. Comes and goes. Intermittent through the day. Some sensation of things moving. No otalgia. Lasts a few seconds. Leaning to side, sitting to standing may trigger.No numbness or weakness unilaterally has been noted.  ? ?Depression screen [facility] 2/9 02/24/2022 11/25/2021 11/04/2021  ?Decreased Interest 0 0 0  ?Down, Depressed, Hopeless 0 0 0  ?PHQ - 2 Score 0 0 0  ?Altered sleeping - - 0  ?Tired, decreased energy - - 0  ?Change in appetite - - 1  ?Feeling bad or failure about yourself  - - 0  ?Trouble concentrating - - 0  ?Moving slowly or fidgety/restless - - 0  ?Suicidal thoughts - - 0  ?PHQ-9 Score - - 1  ?Difficult doing work/chores - - Not difficult at all  ? ? ?History ?Kyle Burns has a past medical history of Asthma, Back pain, Bilevel positive airway pressure (BPAP) dependence, GERD (gastroesophageal reflux disease), HTN (hypertension), and Hyperlipidemia.  ? ?He has a past surgical history that includes bilateral hernia repair and Cyst removal neck.  ? ?His family history includes Arthritis in his father and mother; Asthma in his father, maternal grandfather, and paternal grandfather; Cancer in his father; Crohn's disease in his mother; Diabetes in his maternal grandfather and mother; Hypertension in his maternal grandmother and sister; Multiple sclerosis in his brother; Stroke in his maternal grandfather.He reports that he has never smoked. He quit smokeless tobacco use about 2 years ago.  His smokeless tobacco use included chew. He reports that he does not drink alcohol and does not use drugs. ? ? ? ?ROS ?Review of Systems  ?Constitutional:  Negative for activity change, appetite change and fever.  ?HENT:  Negative for ear pain.   ?Respiratory:  Negative for shortness of breath.   ?Cardiovascular:  Negative for chest pain.   ?Gastrointestinal:  Negative for abdominal pain and nausea.  ?Musculoskeletal:  Negative for arthralgias.  ?Skin:  Negative for rash.  ?Neurological:  Positive for dizziness. Negative for syncope.  ? ?Objective:  ?BP 101/63   Pulse 84   Temp 97.6 ?F (36.4 ?C)   Ht 5\' 11"  (1.803 m)   Wt 285 lb 9.6 oz (129.5 kg)   SpO2 95%   BMI 39.83 kg/m?  ? ?BP Readings from Last 3 Encounters:  ?02/24/22 101/63  ?11/25/21 121/77  ?11/22/21 121/83  ? ? ?Wt Readings from Last 3 Encounters:  ?02/24/22 285 lb 9.6 oz (129.5 kg)  ?11/25/21 294 lb (133.4 kg)  ?11/22/21 290 lb (131.5 kg)  ? ? ? ?Physical Exam ?Vitals reviewed.  ?Constitutional:   ?   Appearance: He is well-developed.  ?HENT:  ?   Head: Normocephalic and atraumatic.  ?   Comments: TMs have fluid rim at bottom. Possible scarring ?   Right Ear: External ear normal.  ?   Left Ear: External ear normal.  ?   Mouth/Throat:  ?   Pharynx: No oropharyngeal exudate or posterior oropharyngeal erythema.  ?Eyes:  ?   Pupils: Pupils are equal, round, and reactive to light.  ?Cardiovascular:  ?   Rate and Rhythm: Normal rate and regular rhythm.  ?   Heart sounds: No murmur heard. ?Pulmonary:  ?   Effort: No respiratory distress.  ?   Breath sounds: Normal breath sounds.  ?Musculoskeletal:  ?  Cervical back: Normal range of motion and neck supple.  ?Skin: ?   General: Skin is warm and dry.  ?Neurological:  ?   Mental Status: He is alert and oriented to person, place, and time.  ?   Cranial Nerves: No cranial nerve deficit.  ?   Sensory: No sensory deficit.  ?   Motor: No weakness.  ?   Coordination: Coordination normal.  ? ? ? ? ?Assessment & Plan:  ? ?Kyle Burns was seen today for dizziness. ? ?Diagnoses and all orders for this visit: ? ?Dizziness ?-     CMP14+EGFR ?-     CBC with Differential/Platelet ? ?Seasonal allergic rhinitis due to pollen ?-     betamethasone acetate-betamethasone sodium phosphate (CELESTONE) injection 6 mg ? ? ? ? ? ? ?I am having Kyle Flow E. Stfort "Pilar Plate"  maintain his aspirin, AeroChamber MV, OVER THE COUNTER MEDICATION, albuterol, atorvastatin, esomeprazole, FLUoxetine, fluticasone, furosemide, losartan-hydrochlorothiazide, spironolactone, levocetirizine, diclofenac, and budesonide-formoterol. We will continue to administer betamethasone acetate-betamethasone sodium phosphate. ? ?Allergies as of 02/24/2022   ?No Known Allergies ?  ? ?  ?Medication List  ?  ? ?  ? Accurate as of February 24, 2022  9:23 AM. If you have any questions, ask your nurse or doctor.  ?  ?  ? ?  ? ?AeroChamber MV inhaler ?Use as instructed ?  ?albuterol 108 (90 Base) MCG/ACT inhaler ?Commonly known as: Ventolin HFA ?USE 2 PUFFS EVERY 6 HOURS AS NEEDED FOR WHEEZING (1 for home, 1 for work) ?  ?aspirin 81 MG EC tablet ?Take 162 mg by mouth daily. ?  ?atorvastatin 10 MG tablet ?Commonly known as: LIPITOR ?Take 1 tablet (10 mg total) by mouth daily. ?  ?budesonide-formoterol 80-4.5 MCG/ACT inhaler ?Commonly known as: Symbicort ?Inhale 2 puffs into the lungs 2 (two) times daily. ?  ?diclofenac 75 MG EC tablet ?Commonly known as: VOLTAREN ?Take 1 tablet (75 mg total) by mouth 2 (two) times daily as needed for moderate pain. ?  ?esomeprazole 40 MG capsule ?Commonly known as: NexIUM ?Take 1 capsule (40 mg total) by mouth daily. ?  ?FLUoxetine 20 MG capsule ?Commonly known as: PROZAC ?TAKE ONE (1) CAPSULE EACH DAY ?  ?fluticasone 50 MCG/ACT nasal spray ?Commonly known as: FLONASE ?Place 2 sprays into both nostrils daily. ?  ?furosemide 20 MG tablet ?Commonly known as: LASIX ?TAKE ONE (1) TABLET EACH DAY ?  ?levocetirizine 5 MG tablet ?Commonly known as: XYZAL ?Take 1 tablet (5 mg total) by mouth every evening. To replace Zyrtec ?  ?losartan-hydrochlorothiazide 100-25 MG tablet ?Commonly known as: HYZAAR ?Take 1 tablet by mouth daily. ?  ?OVER THE COUNTER MEDICATION ?VIT C , VIT D, Zinc, MVI 50 plus - all one a day ?  ?spironolactone 25 MG tablet ?Commonly known as: ALDACTONE ?TAKE ONE (1) TABLET EACH  DAY ?  ? ?  ? ? ? ?Follow-up: Return if symptoms worsen or fail to improve. ? ?Claretta Fraise, M.D. ?

## 2022-02-25 LAB — CMP14+EGFR
ALT: 36 IU/L (ref 0–44)
AST: 23 IU/L (ref 0–40)
Albumin/Globulin Ratio: 1.8 (ref 1.2–2.2)
Albumin: 4.7 g/dL (ref 3.8–4.9)
Alkaline Phosphatase: 98 IU/L (ref 44–121)
BUN/Creatinine Ratio: 17 (ref 9–20)
BUN: 21 mg/dL (ref 6–24)
Bilirubin Total: 0.3 mg/dL (ref 0.0–1.2)
CO2: 27 mmol/L (ref 20–29)
Calcium: 9.9 mg/dL (ref 8.7–10.2)
Chloride: 101 mmol/L (ref 96–106)
Creatinine, Ser: 1.26 mg/dL (ref 0.76–1.27)
Globulin, Total: 2.6 g/dL (ref 1.5–4.5)
Glucose: 105 mg/dL — ABNORMAL HIGH (ref 70–99)
Potassium: 3.9 mmol/L (ref 3.5–5.2)
Sodium: 140 mmol/L (ref 134–144)
Total Protein: 7.3 g/dL (ref 6.0–8.5)
eGFR: 67 mL/min/{1.73_m2} (ref >59)

## 2022-02-25 LAB — CBC WITH DIFFERENTIAL/PLATELET
Basophils Absolute: 0 10*3/uL (ref 0.0–0.2)
Basos: 1 %
EOS (ABSOLUTE): 0.1 10*3/uL (ref 0.0–0.4)
Eos: 2 %
Hematocrit: 46.3 % (ref 37.5–51.0)
Hemoglobin: 15.2 g/dL (ref 13.0–17.7)
Immature Grans (Abs): 0 10*3/uL (ref 0.0–0.1)
Immature Granulocytes: 0 %
Lymphocytes Absolute: 2.8 10*3/uL (ref 0.7–3.1)
Lymphs: 36 %
MCH: 28.6 pg (ref 26.6–33.0)
MCHC: 32.8 g/dL (ref 31.5–35.7)
MCV: 87 fL (ref 79–97)
Monocytes Absolute: 0.9 10*3/uL (ref 0.1–0.9)
Monocytes: 11 %
Neutrophils Absolute: 3.9 10*3/uL (ref 1.4–7.0)
Neutrophils: 50 %
Platelets: 289 10*3/uL (ref 150–450)
RBC: 5.32 x10E6/uL (ref 4.14–5.80)
RDW: 12.1 % (ref 11.6–15.4)
WBC: 7.8 10*3/uL (ref 3.4–10.8)

## 2022-02-25 NOTE — Progress Notes (Signed)
Hello Kyle Burns,  Your lab result is normal and/or stable.Some minor variations that are not significant are commonly marked abnormal, but do not represent any medical problem for you.  Best regards, Deyanna Mctier, M.D.

## 2022-04-02 ENCOUNTER — Encounter: Payer: Self-pay | Admitting: Internal Medicine

## 2022-04-02 ENCOUNTER — Ambulatory Visit: Payer: No Typology Code available for payment source | Admitting: Internal Medicine

## 2022-04-02 VITALS — BP 130/86 | HR 74 | Temp 98.7°F | Ht 71.0 in | Wt 293.8 lb

## 2022-04-02 DIAGNOSIS — G4733 Obstructive sleep apnea (adult) (pediatric): Secondary | ICD-10-CM | POA: Diagnosis not present

## 2022-04-02 DIAGNOSIS — Z6841 Body Mass Index (BMI) 40.0 and over, adult: Secondary | ICD-10-CM

## 2022-04-02 DIAGNOSIS — J986 Disorders of diaphragm: Secondary | ICD-10-CM

## 2022-04-02 DIAGNOSIS — J4520 Mild intermittent asthma, uncomplicated: Secondary | ICD-10-CM | POA: Diagnosis not present

## 2022-04-02 HISTORY — DX: Mild intermittent asthma, uncomplicated: J45.20

## 2022-04-02 NOTE — Patient Instructions (Addendum)
Symbicort 80 can be used up to 2 puffs  first thing in am and 2 puff every every 12 hours and then wean off after a week ? ?Only use your albuterol as a rescue medication to be used if you can't catch your breath by resting or doing a relaxed purse lip breathing pattern.  ?- The less you use it, the better it will work when you need it. ?- Ok to use up to 2 puffs  every 4 hours if you must but call for immediate appointment if use goes up over your usual need ?- Don't leave home without it !!  (think of it like the spare tire for your car)  ? ?Ok to try albuterol 15 min before an activity (on alternating days)  that you know would usually make you short of breath and see if it makes any difference and if makes none then don't take albuterol after activity unless you can't catch your breath as this means it's the resting that helps, not the albuterol. ? ? ?Work on inhaler technique:  relax and gently blow all the way out then take a nice smooth full deep breath back in, triggering the inhaler at same time you start breathing in.  Hold for up to 5 seconds if you can. Blow out thru nose. Rinse and gargle with water when done.  If mouth or throat bother you at all,  try brushing teeth/gums/tongue with arm and hammer toothpaste/ make a slurry and gargle and spit out.  ? ?   ?I will see what we can do right now to get the equipment and get you set you set up here with one of our sleep doctors  ?

## 2022-04-02 NOTE — Progress Notes (Addendum)
? ?DARRILL VREELAND, male    DOB: 12/29/1965,    MRN: [id number] ? ? 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 Asthma as child no problem as adolescent  self referred to pulmonary clinic in   04/02/2022  with documented L HD immobility and MO by BMI c/b OSA on cpap  ? ? ? ? ?History of Present Illness  ?04/02/2022  Pulmonary/ 1st office eval/ Sherene Sires / [facility] was maintained on symbicort previously but off x over  a month s change in symptoms  ?Chief Complaint  ?Patient presents with  ? New Patient (Initial Visit)  ?  Has seen [name].  ?Feels asthma is going well.   ?Dyspnea:  not sure symbicort really helps  ?Cough: none  ?Sleep: cpap at hs needs new mask  ?SABA use: last used a month prior to OV   ? ?No obvious day to day or daytime variability or assoc excess/ purulent sputum or mucus plugs or hemoptysis or cp or chest tightness, subjective wheeze or overt sinus or hb symptoms.  ? ?Sleeping on  or off cpap  without nocturnal  or early am exacerbation  of respiratory  c/o's or need for noct saba. Also denies any obvious fluctuation of symptoms with weather or environmental changes or other aggravating or alleviating factors except as outlined above  ? ?No unusual exposure hx or h/o childhood pna or knowledge of premature birth. ? ?Current Allergies, Complete Past Medical History, Past Surgical History, Family History, and Social History were reviewed in [name] record. ? ?ROS  The following are not active complaints unless bolded ?Hoarseness, sore throat, dysphagia, dental problems, itching, sneezing,  nasal congestion or discharge of excess mucus or purulent secretions, ear ache,   fever, chills, sweats, unintended wt loss or wt gain, classically pleuritic or exertional cp,  orthopnea pnd or arm/hand swelling  or leg swelling, presyncope, palpitations, abdominal pain, anorexia, nausea, vomiting, diarrhea  or change in bowel habits or change in  bladder habits, change in stools or change in urine, dysuria, hematuria,  rash, arthralgias, visual complaints, headache, numbness, weakness or ataxia or problems with walking or coordination,  change in mood or  memory. ?      ?   ? ?Past Medical History:  ?Diagnosis Date  ? Asthma   ? hx of   ? Back pain   ? Bilevel positive airway pressure (BPAP) dependence   ? GERD (gastroesophageal reflux disease)   ? HTN (hypertension)   ? Hyperlipidemia   ? ? ?Outpatient Medications Prior to Visit  ?Medication Sig Dispense Refill  ? albuterol (VENTOLIN HFA) 108 (90 Base) MCG/ACT inhaler USE 2 PUFFS EVERY 6 HOURS AS NEEDED FOR WHEEZING (1 for home, 1 for work) 36 g 1  ? aspirin 81 MG EC tablet Take 162 mg by mouth daily.     ? atorvastatin (LIPITOR) 10 MG tablet Take 1 tablet (10 mg total) by mouth daily. 90 tablet 3  ? budesonide-formoterol (SYMBICORT) 80-4.5 MCG/ACT inhaler Inhale 2 puffs into the lungs 2 (two) times daily. 30.6 g 4  ? esomeprazole (NEXIUM) 40 MG capsule Take 1 capsule (40 mg total) by mouth daily. 90 capsule 3  ? FLUoxetine (PROZAC) 20 MG capsule TAKE ONE (1) CAPSULE EACH DAY 90 capsule 3  ? fluticasone (FLONASE) 50 MCG/ACT nasal spray Place 2 sprays into both nostrils daily. 16 g 6  ? furosemide (LASIX) 20 MG tablet TAKE ONE (1) TABLET EACH  DAY 90 tablet 3  ? levocetirizine (XYZAL) 5 MG tablet Take 1 tablet (5 mg total) by mouth every evening. To replace Zyrtec 90 tablet 3  ? losartan-hydrochlorothiazide (HYZAAR) 100-25 MG tablet Take 1 tablet by mouth daily. 90 tablet 3  ? OVER THE COUNTER MEDICATION VIT C , VIT D, Zinc, MVI 50 plus - all one a day    ? Spacer/Aero-Holding Chambers (AEROCHAMBER MV) inhaler Use as instructed 1 each 0  ? spironolactone (ALDACTONE) 25 MG tablet TAKE ONE (1) TABLET EACH DAY 90 tablet 3  ? diclofenac (VOLTAREN) 75 MG EC tablet Take 1 tablet (75 mg total) by mouth 2 (two) times daily as needed for moderate pain. 60 tablet 0  ? ?No facility-administered medications prior to  visit.  ? ? ? ?Objective:  ?  ? ?BP 130/86 (BP Location: Left Arm, Patient Position: Sitting)   Pulse 74   Temp 98.7 ?F (37.1 ?C) (Temporal)   Ht 5\' 11"  (1.803 m)   Wt 293 lb 12.8 oz (133.3 kg)   SpO2 97% Comment: ra  BMI 40.98 kg/m?  ? ?SpO2: 97 % (ra) ? ?Very pleasant obese bm nad ? ? HEENT : nl  exam   ? ? ?NECK :  without JVD/Nodes/TM/ nl carotid upstrokes bilaterally ? ? ?LUNGS: no acc muscle use,  Nl contour chest which is clear to A and P bilaterally without cough on insp or exp maneuvers ? ? ?CV:  RRR  no s3 or murmur or increase in P2, and no edema  ? ?ABD:  obese soft and nontender with nl inspiratory excursion in the supine position. No bruits or organomegaly appreciated, bowel sounds nl ? ?MS:  Nl gait/ ext warm without deformities, calf tenderness, cyanosis or clubbing ?No obvious joint restrictions  ? ?SKIN: warm and dry without lesions   ? ?NEURO:  alert, approp, nl sensorium with  no motor or cerebellar deficits apparent.  ? ? ?   ?Assessment  ? ?Mild intermittent asthma ?Onset in childhood ?  / played sports fine s needing saba ?- 04/02/2022  After extensive coaching inhaler device,  effectiveness =    75% > symbicort 80 2bid  ? ?Based on two studies from NEJM  378; 20 p 1865 (2018) and 380 : p2020-30 (2019) in pts with mild asthma it is reasonable to use low dose symbicort eg 80 2bid "prn" flare in this setting but I emphasized this was only shown with symbicort and takes advantage of the rapid onset of action but is not the same as "rescue therapy" but can be stopped once the acute symptoms have resolved and the need for rescue has been minimized (< 2 x weekly)   ? ?Left hemidiaphragm paralysis ?No history of trauma ?06/2013 sniff test> left hemidiaphragm immobile / no paradox  ?09/2014 PFT> no obstruction, TLC 4.85L (67% pred), DLCO 65% pred ?09/2014 CT chest > no mediastinal mass or lymphadenopathy, 3mm RUL nodule ?April 2016 PFT> ratio normal, FEV1 1.85 L (53% predicted, no change of  bronchial dilator), FVC 2.14 L (49% predicted), total lung capacity 3.86 L) 53% predicted), DLCO 19.79 (58% Pred)  ? ?Key here is controlling wt, no indication for plication  ? ?Class 3 severe obesity with serious comorbidity and body mass index (BMI) of 40.0 to 44.9 in adult [facility](HCC) ?Body mass index is 40.98 kg/m?.   ?Lab Results  ?Component Value Date  ? TSH 1.570 11/04/2021  ?  ? ? ?Contributing to doe and risk of GERD both which  may cause symptoms overlapping with asthma ?>>>   reviewed the need and the process to achieve and maintain neg calorie balance > defer f/u primary care including intermittently monitoring thyroid status    ? ?Obstructive sleep apnea ?May 2016 polysomnogram AHI 29, CPAP titrated to 18  cm of water, O2 saturation 89% on CPAP. ?06/2015 ONO on CPAP < 89% 27 min ?05/2016 Download> 89% usage, average time is 4 hours and 30 minutes with pt new to me ? ?>>> referred to sleep medicine  ? ? ?    ?  ? ?Each maintenance medication was reviewed in detail including emphasizing most importantly the difference between maintenance and prns and under what circumstances the prns are to be triggered using an action plan format where appropriate. ? ?Total time for H and P, chart review, counseling, reviewing hfa device(s) and generating customized AVS unique to this office visit / same day charting > 30 min  ?     ? ? ? ? ?Sandrea Hughs, MD ?04/02/2022 ?   ?

## 2022-04-03 ENCOUNTER — Encounter: Payer: Self-pay | Admitting: Internal Medicine

## 2022-04-03 ENCOUNTER — Telehealth: Payer: Self-pay | Admitting: Internal Medicine

## 2022-04-03 NOTE — Assessment & Plan Note (Addendum)
May 2016 polysomnogram AHI 29, CPAP titrated to 18  cm of water, O2 saturation 89% on CPAP. ?06/2015 ONO on CPAP < 89% 27 min ?05/2016 Download> 89% usage, average time is 4 hours and 30 minutes with pt new to me ? ?>>> referred to sleep medicine  ? ? ?    ?  ? ?Each maintenance medication was reviewed in detail including emphasizing most importantly the difference between maintenance and prns and under what circumstances the prns are to be triggered using an action plan format where appropriate. ? ?Total time for H and P, chart review, counseling, reviewing hfa device(s) and generating customized AVS unique to this office visit / same day charting > 30 min  ?     ?

## 2022-04-03 NOTE — Assessment & Plan Note (Signed)
Onset in childhood ?  / played sports fine s needing saba ?- 04/02/2022  After extensive coaching inhaler device,  effectiveness =    75% > symbicort 80 2bid  ? ?Based on two studies from NEJM  378; 20 p 1865 (2018) and 380 : p2020-30 (2019) in pts with mild asthma it is reasonable to use low dose symbicort eg 80 2bid "prn" flare in this setting but I emphasized this was only shown with symbicort and takes advantage of the rapid onset of action but is not the same as "rescue therapy" but can be stopped once the acute symptoms have resolved and the need for rescue has been minimized (< 2 x weekly)   ?

## 2022-04-03 NOTE — Assessment & Plan Note (Signed)
Body mass index is 40.98 kg/m?.   ?Lab Results  ?Component Value Date  ? TSH 1.570 11/04/2021  ?  ? ? ?Contributing to doe and risk of GERD both which may cause symptoms overlapping with asthma ?>>>   reviewed the need and the process to achieve and maintain neg calorie balance > defer f/u primary care including intermittently monitoring thyroid status    ?

## 2022-04-03 NOTE — Assessment & Plan Note (Signed)
No history of trauma ?06/2013 sniff test> left hemidiaphragm immobile / no paradox  ?09/2014 PFT> no obstruction, TLC 4.85L (67% pred), DLCO 65% pred ?09/2014 CT chest > no mediastinal mass or lymphadenopathy, 77mm RUL nodule ?April 2016 PFT> ratio normal, FEV1 1.85 L (53% predicted, no change of bronchial dilator), FVC 2.14 L (49% predicted), total lung capacity 3.86 L) 53% predicted), DLCO 19.79 (58% Pred)  ? ?Key here is controlling wt, no indication for plication  ?

## 2022-04-03 NOTE — Telephone Encounter (Signed)
PCCs can you check on this? The order was placed yesterday ?

## 2022-04-04 NOTE — Telephone Encounter (Signed)
I do have it.  Just haven't had a chance to send it in yet.  Faxing order now.  Will call pt and make him aware.  Nothing further needed. ?

## 2022-04-04 NOTE — Telephone Encounter (Signed)
Cordelia Pen do you have this DME referral to send to [facility]? I saw where you pended it so I just wanted to make sure. ?

## 2022-04-08 ENCOUNTER — Other Ambulatory Visit: Payer: Self-pay | Admitting: Family Medicine

## 2022-04-08 DIAGNOSIS — M25511 Pain in right shoulder: Secondary | ICD-10-CM

## 2022-04-09 ENCOUNTER — Encounter: Payer: Self-pay | Admitting: Orthopedic Surgery

## 2022-04-18 ENCOUNTER — Other Ambulatory Visit: Payer: Self-pay | Admitting: Family Medicine

## 2022-04-18 DIAGNOSIS — M25511 Pain in right shoulder: Secondary | ICD-10-CM

## 2022-04-18 MED ORDER — IBUPROFEN 600 MG PO TABS: 600.0000 mg | ORAL_TABLET | Freq: Three times a day (TID) | ORAL | 1 refills | Status: DC | PRN

## 2022-05-22 ENCOUNTER — Ambulatory Visit: Payer: No Typology Code available for payment source | Admitting: Pulmonary Disease

## 2022-05-22 ENCOUNTER — Encounter: Payer: Self-pay | Admitting: Pulmonary Disease

## 2022-05-22 VITALS — BP 128/86 | HR 68 | Temp 97.7°F | Ht 71.0 in | Wt 290.2 lb

## 2022-05-22 DIAGNOSIS — G4733 Obstructive sleep apnea (adult) (pediatric): Secondary | ICD-10-CM

## 2022-05-22 DIAGNOSIS — J9611 Chronic respiratory failure with hypoxia: Secondary | ICD-10-CM | POA: Diagnosis not present

## 2022-05-22 NOTE — Patient Instructions (Signed)
Will arrange for new supply company to get replacement Bipap mask and equipment, and get a new oxygen concentrator  Follow up in 1 year

## 2022-05-22 NOTE — Progress Notes (Signed)
[facility]    Pulmonary, Critical Care, and Sleep Medicine  Chief Complaint  Patient presents with   Consult    Referred by Dr. Sherene Sires for sleep consult. Currently uses cpap. Needs a new dme     Past Surgical History:  He  has a past surgical history that includes bilateral hernia repair and Cyst removal neck.  Past Medical History:  Asthma, Back pain, GERD, HTN, HLD  Constitutional:  BP 128/86 (BP Location: Left Arm, Patient Position: Sitting)   Pulse 68   Temp 97.7 F (36.5 C)   Ht 5\' 11"  (1.803 m)   Wt 290 lb 3.2 oz (131.6 kg)   SpO2 97% Comment: ra  BMI 40.47 kg/m   Brief Summary:  Kyle Burns is a 56 y.o. 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      Subjective:   He was previously seen by Dr. Emergency planning/management officer, Kendrick Fries, and Dr. [name].  He had sleep study in 2016 that showed moderate to severe sleep apnea.  Sniff test from 2015 showed paralyzed Lt diaphragm.  PFT from 2019 showed restriction and diffusion defect.  He has been using Bipap with 2 liters oxygen at night.  Has full face mask.  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  He is not using anything to help sleep or stay awake.  He is not having cough, wheeze, or sputum.  Hasn't needed to use his inhalers.  Physical Exam:   Appearance - well kempt   ENMT - no sinus tenderness, no oral exudate, no LAN, Mallampati 3 airway, no stridor  Respiratory - equal breath sounds bilaterally, no wheezing or rales  CV - s1s2 regular rate and rhythm, no murmurs  Ext - no clubbing, no edema  Skin - no rashes  Psych - normal mood and affect   Pulmonary testing:  PFT 06/04/18 >> FEV1 2.11 (62%), FEV1% 89, TLC 4.89 (68%), DLCO 62%  Chest Imaging:  Sniff  test 06/20/14 >> paralyzed Lt diaphragm  Sleep Tests:  PSG 05/17/15 >> AHI 29.5, SpO2 low 61% Bipap 05/12/18 >> Bipap 17/13 cm H2O, 2 liters oxygen Bipap 04/22/22 to 05/21/22 >> used on 28 of 30 nights with average 5 hrs 17 min.  Average AHI 1.6 with Bipap 17/13 cm H2O  Cardiac Tests:  Echo 04/23/18 >> EF 60 to 65%, mild LVH  Social History:  He  reports that he has never smoked. He quit smokeless tobacco use about 2 years ago.  His smokeless tobacco use included chew. He reports that he does not drink alcohol and does not use drugs.  Family History:  His family history includes Arthritis in his father and mother; Asthma in his father, maternal grandfather, and paternal grandfather; Cancer in his father; Crohn's disease in his mother; Diabetes in his maternal grandfather and mother; Hypertension in his maternal grandmother and sister; Multiple sclerosis in his brother; Stroke in his maternal grandfather.     Assessment/Plan:   Obstructive sleep apnea. - he is compliant with Bipap and reports benefit from therapy - he needs to switch to a new DME due to insurance coverage - his current Bipap was ordered on 05/14/18 - continue Bipap 17/13 cm H2O  Chronic hypoxic respiratory failure from left diaphragm paralysis with restrictive  lung defect. - uses 2 liters oxygen at night with Bipap - his current oxygen concentrator was ordered on 07/04/15; will see if he can get a new oxygen concentrator  Mild, intermittent asthma. - prn inhalers  Time Spent Involved in Patient Care on Day of Examination:  26 minutes  Follow up:   Patient Instructions  Will arrange for new supply company to get replacement Bipap mask and equipment, and get a new oxygen concentrator  Follow up in 1 year  Medication List:   Allergies as of 05/22/2022   No Known Allergies      Medication List        Accurate as of May 22, 2022  8:56 AM. If you have any questions, ask your nurse or doctor.           AeroChamber MV inhaler Use as instructed   albuterol 108 (90 Base) MCG/ACT inhaler Commonly known as: Ventolin HFA USE 2 PUFFS EVERY 6 HOURS AS NEEDED FOR WHEEZING (1 for home, 1 for work)   aspirin EC 81 MG tablet Take 162 mg by mouth daily.   atorvastatin 10 MG tablet Commonly known as: LIPITOR Take 1 tablet (10 mg total) by mouth daily.   budesonide-formoterol 80-4.5 MCG/ACT inhaler Commonly known as: Symbicort Inhale 2 puffs into the lungs 2 (two) times daily.   esomeprazole 40 MG capsule Commonly known as: NexIUM Take 1 capsule (40 mg total) by mouth daily.   FLUoxetine 20 MG capsule Commonly known as: PROZAC TAKE ONE (1) CAPSULE EACH DAY   fluticasone 50 MCG/ACT nasal spray Commonly known as: FLONASE Place 2 sprays into both nostrils daily.   furosemide 20 MG tablet Commonly known as: LASIX TAKE ONE (1) TABLET EACH DAY   ibuprofen 600 MG tablet Commonly known as: ADVIL Take 1 tablet (600 mg total) by mouth every 8 (eight) hours as needed for moderate pain.   levocetirizine 5 MG tablet Commonly known as: XYZAL Take 1 tablet (5 mg total) by mouth every evening. To replace Zyrtec   losartan-hydrochlorothiazide 100-25 MG tablet Commonly known as: HYZAAR Take 1 tablet by mouth daily.   OVER THE COUNTER MEDICATION VIT C , VIT D, Zinc, MVI 50 plus - all one a day   spironolactone 25 MG tablet Commonly known as: ALDACTONE TAKE ONE (1) TABLET EACH DAY        Signature:  Coralyn Helling, MD [facility] Pager - [phone] 05/22/2022, 8:56 AM

## 2022-06-03 ENCOUNTER — Encounter: Payer: Self-pay | Admitting: Family Medicine

## 2022-06-05 ENCOUNTER — Encounter: Payer: Self-pay | Admitting: Orthopedic Surgery

## 2022-06-09 ENCOUNTER — Telehealth: Payer: Self-pay

## 2022-06-09 NOTE — Telephone Encounter (Signed)
Patient left message during lunch about getting an appointment with Dr. Dallas Schimke for his shoulders. I returned the call and had to leave message on voicemail to call the office.

## 2022-06-18 ENCOUNTER — Ambulatory Visit: Payer: No Typology Code available for payment source | Admitting: Orthopedic Surgery

## 2022-06-19 ENCOUNTER — Ambulatory Visit: Payer: No Typology Code available for payment source | Admitting: Physician Assistant

## 2022-06-19 ENCOUNTER — Encounter: Payer: Self-pay | Admitting: Physician Assistant

## 2022-06-19 VITALS — BP 115/80 | HR 95 | Temp 97.8°F | Ht 71.0 in | Wt 281.0 lb

## 2022-06-19 DIAGNOSIS — M7582 Other shoulder lesions, left shoulder: Secondary | ICD-10-CM

## 2022-06-19 MED ORDER — BETAMETHASONE SOD PHOS & ACET 6 (3-3) MG/ML IJ SUSP
9.0000 mg | Freq: Once | INTRAMUSCULAR | Status: AC
  Administered 2022-06-19: 9 mg via INTRA_ARTICULAR

## 2022-06-19 NOTE — Progress Notes (Signed)
  Subjective:     Patient ID: Kyle Burns, male   DOB: 12-12-1966, 56 y.o.   MRN: [id number]  Shoulder Pain    Pt with a several month hx of L shoulder pain Sx initially bilat but then changed to the L shoulder Sx worse at night Has noticed some decrease in ROM Prev seen by Ortho and had neg Xray Has tried NSAIDS and muscle relaxants w/o change in sx  Review of Systems  Constitutional: Negative.   Musculoskeletal:  Positive for arthralgias and myalgias.       Objective:   Physical Exam Vitals and nursing note reviewed.  Constitutional:      General: He is not in acute distress.    Appearance: Normal appearance. He is not ill-appearing or toxic-appearing.   Some decrease in ROM of L vs R shoulder No real TTP of the L shoulder Grip strength is equal bilat Good pulses/sensory bilat in UE FROM of wrist/elbow w/o sx + sx with rest. Lateral/ant abduction No sx with int/ext rotation Discussed possible inj today due to failure of therapy Pt gave oral consent Under sterile technique 1.5 cc marc/1.5 cc Celestone given Bandage placed     Assessment:     1. Rotator cuff tendinitis, left        Plan:     ROM exercises reviewed with pt OTC meds for sx Heat/Ice Discussed f/u with ortho and possible MRI if sx continue PATP

## 2022-06-19 NOTE — Addendum Note (Signed)
Addended by: Hessie Diener on: 06/19/2022 04:28 PM   Modules accepted: Orders

## 2022-06-19 NOTE — Patient Instructions (Signed)
Rotator Cuff Tendinitis  Rotator cuff tendinitis is inflammation of the tendons in the rotator cuff. Tendons are tough, cord-like bands that connect muscle to bone. The rotator cuff includes all of the muscles and tendons that connect the arm to the shoulder. The rotator cuff holds the head of the humerus, or the upper arm bone, in the cup of the shoulder blade (scapula). This condition can lead to a long-term or chronic tear. The tear may be partial or complete. What are the causes? This condition is usually caused by overusing the rotator cuff. What increases the risk? This condition is more likely to develop in athletes and workers who frequently use their shoulder or reach over their heads. This can include activities such as: Tennis. Baseball or softball. Swimming. Construction work. Painting. What are the signs or symptoms? Symptoms of this condition include: Pain that spreads (radiates) from the shoulder to the upper arm. Swelling and tenderness in front of the shoulder. Pain when reaching, pulling, or lifting the arm above the head. Pain when lowering the arm from above the head. Minor pain in the shoulder when resting. Increased pain in the shoulder at night. Difficulty placing the arm behind the back. How is this diagnosed? This condition is diagnosed with a physical exam and medical history. Tests may also be done, including: X-rays. MRI. Ultrasound. CT with or without contrast. How is this treated? Treatment for this condition depends on the severity of the condition. In less severe cases, treatment may include: Rest. This may be done with a sling that holds the shoulder still (immobilization). Your health care provider may also recommend avoiding activities that involve lifting your arm over your head. Icing the shoulder. Anti-inflammatory medicines, such as aspirin or ibuprofen. In more severe cases, treatment may include: Physical therapy. Steroid  injections. Surgery. Follow these instructions at home: If you have a sling: Wear the sling as told by your health care provider. Remove it only as told by your health care provider. Loosen it if your fingers tingle, become numb, or turn cold and blue. Keep it clean. If the sling is not waterproof: Do not let it get wet. Cover it with a watertight covering when you take a bath or shower. Managing pain, stiffness, and swelling  If directed, put ice on the injured area. To do this: If you have a removable sling, remove it as told by your health care provider. Put ice in a plastic bag. Place a towel between your skin and the bag. Leave the ice on for 20 minutes, 2-3 times a day. Move your fingers often to reduce stiffness and swelling. Raise (elevate) the injured area above the level of your heart while you are lying down. Find a comfortable sleeping position, or sleep in a recliner, if available. Activity Rest your shoulder as told by your health care provider. Ask your health care provider when it is safe to drive if you have a sling on your arm. Return to your normal activities as told by your health care provider. Ask your health care provider what activities are safe for you. Do any exercises or stretches as told by your health care provider or physical therapist. If you do repetitive overhead tasks, take small breaks in between and include stretching exercises as told by your health care provider. General instructions Do not use any products that contain nicotine or tobacco, such as cigarettes, e-cigarettes, and chewing tobacco. These can delay healing. If you need help quitting, ask your health care provider.   Take over-the-counter and prescription medicines only as told by your health care provider. Keep all follow-up visits as told by your health care provider. This is important. Contact a health care provider if: Your pain gets worse. You have new pain in your arm, hands, or  fingers. Your pain is not relieved with medicine or does not get better after 6 weeks of treatment. You have crackling sensations when moving your shoulder in certain directions. You hear a snapping sound after using your shoulder, followed by severe pain and weakness. Get help right away if: Your arm, hand, or fingers are numb or tingling. Your arm, hand, or fingers are swollen or painful or they turn white or blue. Summary Rotator cuff tendinitis is inflammation of the tendons in the rotator cuff. Tendons are tough, cord-like bands that connect muscle to bone. This condition is usually caused by overusing the rotator cuff, which includes all of the muscles and tendons that connect the arm to the shoulder. This condition is more likely to develop in athletes and workers who frequently use their shoulder or reach over their heads. Treatment generally includes rest, anti-inflammatory medicines, and icing. In some cases, physical therapy and steroid injections may be needed. In severe cases, surgery may be needed. This information is not intended to replace advice given to you by your health care provider. Make sure you discuss any questions you have with your health care provider. Document Revised: 09/05/2019 Document Reviewed: 09/05/2019 Elsevier Patient Education  2023 [employer]  

## 2022-06-20 ENCOUNTER — Other Ambulatory Visit: Payer: Self-pay | Admitting: Family Medicine

## 2022-06-20 DIAGNOSIS — M25511 Pain in right shoulder: Secondary | ICD-10-CM

## 2022-06-25 ENCOUNTER — Ambulatory Visit: Payer: No Typology Code available for payment source | Admitting: Orthopedic Surgery

## 2022-06-25 ENCOUNTER — Encounter: Payer: Self-pay | Admitting: Orthopedic Surgery

## 2022-06-25 VITALS — Ht 71.0 in | Wt 281.0 lb

## 2022-06-25 DIAGNOSIS — M7582 Other shoulder lesions, left shoulder: Secondary | ICD-10-CM

## 2022-06-25 NOTE — Patient Instructions (Signed)
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
## 2022-08-21 ENCOUNTER — Telehealth: Payer: Self-pay | Admitting: Pulmonary Disease

## 2022-08-21 DIAGNOSIS — G4733 Obstructive sleep apnea (adult) (pediatric): Secondary | ICD-10-CM

## 2022-08-21 NOTE — Telephone Encounter (Signed)
Called and notified patient that I sent in a new order for adapt with liter flow and what patient needs it for. He voiced understanding. Nothing further needed

## 2022-08-29 ENCOUNTER — Ambulatory Visit: Payer: No Typology Code available for payment source | Admitting: Family Medicine

## 2022-08-29 ENCOUNTER — Encounter: Payer: Self-pay | Admitting: Family Medicine

## 2022-08-29 VITALS — BP 129/79 | HR 97 | Temp 98.7°F | Ht 71.0 in | Wt 283.0 lb

## 2022-08-29 DIAGNOSIS — J0140 Acute pansinusitis, unspecified: Secondary | ICD-10-CM

## 2022-08-29 DIAGNOSIS — R1032 Left lower quadrant pain: Secondary | ICD-10-CM | POA: Diagnosis not present

## 2022-08-29 LAB — URINALYSIS, ROUTINE W REFLEX MICROSCOPIC
Bilirubin, UA: NEGATIVE
Glucose, UA: NEGATIVE
Ketones, UA: NEGATIVE
Leukocytes,UA: NEGATIVE
Nitrite, UA: NEGATIVE
Protein,UA: NEGATIVE
RBC, UA: NEGATIVE
Specific Gravity, UA: 1.015 (ref 1.005–1.030)
Urobilinogen, Ur: 0.2 mg/dL (ref 0.2–1.0)
pH, UA: 6.5 (ref 5.0–7.5)

## 2022-08-29 MED ORDER — AMOXICILLIN-POT CLAVULANATE 875-125 MG PO TABS
1.0000 | ORAL_TABLET | Freq: Two times a day (BID) | ORAL | 0 refills | Status: AC
Start: 2022-08-29 — End: 2022-09-08

## 2022-08-29 NOTE — Progress Notes (Signed)
Subjective:  Patient ID: Kyle Burns, male    DOB: [birth year], 56 y.o.   MRN: [id number]  Patient Care Team: Janora Norlander, DO as PCP - General (Family Medicine) Verl Blalock, Marijo Conception, MD (Inactive) as Attending Physician (Cardiology)   Chief Complaint:  URI and Abdominal Pain   HPI: Kyle Burns is a 56 y.o. male presenting on 08/29/2022 for URI and Abdominal Pain   Pt presents today for evaluation of ongoing cold symptoms for 2 weeks, COVID test at home negative. He also reports intermittent LLQ and left groin pain for the last 2-3 months.   URI  This is a new problem. The current episode started 1 to 4 weeks ago. The problem has been gradually worsening. Associated symptoms include abdominal pain, congestion, coughing, headaches, a plugged ear sensation, rhinorrhea and sinus pain. Pertinent negatives include no chest pain, diarrhea, dysuria, ear pain, joint pain, joint swelling, nausea, neck pain, rash, sneezing, sore throat, swollen glands, vomiting or wheezing. He has tried decongestant, increased fluids and acetaminophen (nasal spray) for the symptoms. The treatment provided no relief.  Abdominal Pain This is a recurrent problem. The current episode started more than 1 month ago. The problem occurs intermittently. The problem has been unchanged. The pain is located in the LLQ and suprapubic region. The pain is mild. The quality of the pain is aching (nagging). The abdominal pain does not radiate. Associated symptoms include headaches. Pertinent negatives include no anorexia, arthralgias, belching, constipation, diarrhea, dysuria, fever, flatus, frequency, hematochezia, hematuria, melena, myalgias, nausea, vomiting or weight loss. Nothing aggravates the pain. The pain is relieved by Nothing. He has tried nothing for the symptoms.     Relevant past medical, surgical, family, and social history reviewed and updated as indicated.  Allergies and medications reviewed and updated.  Data reviewed: Chart in Epic.   Past Medical History:  Diagnosis Date   Asthma    hx of    Back pain    Bilevel positive airway pressure (BPAP) dependence    GERD (gastroesophageal reflux disease)    HTN (hypertension)    Hyperlipidemia     Past Surgical History:  Procedure Laterality Date   bilateral hernia repair     total of 3    CYST REMOVAL NECK     age [age]s    Social History   Socioeconomic History   Marital status: Single    Spouse name: Not on file   Number of children: 0   Years of education: Not on file   Highest education level: Not on file  Occupational History   Not on file  Tobacco Use   Smoking status: Never   Smokeless tobacco: Former    Types: Chew    Quit date: 10/2019   Tobacco comments:    Uses chew "when he has a bad day"  Vaping Use   Vaping Use: Never used  Substance and Sexual Activity   Alcohol use: No    Comment: Socially   Drug use: No   Sexual activity: Not on file  Other Topics Concern   Not on file  Social History Narrative   Not on file   Social Determinants of Health   Financial Resource Strain: Not on file  Food Insecurity: Not on file  Transportation Needs: Not on file  Physical Activity: Not on file  Stress: Not on file  Social Connections: Not on file  Intimate Partner Violence: Not on file    Outpatient Encounter Medications  as of 08/29/2022  Medication Sig   albuterol (VENTOLIN HFA) 108 (90 Base) MCG/ACT inhaler USE 2 PUFFS EVERY 6 HOURS AS NEEDED FOR WHEEZING (1 for home, 1 for work)   amoxicillin-clavulanate (AUGMENTIN) 875-125 MG tablet Take 1 tablet by mouth 2 (two) times daily for 10 days.   aspirin 81 MG EC tablet Take 162 mg by mouth daily.    atorvastatin (LIPITOR) 10 MG tablet Take 1 tablet (10 mg total) by mouth daily.   budesonide-formoterol (SYMBICORT) 80-4.5 MCG/ACT inhaler Inhale 2 puffs into the lungs 2 (two) times daily.   esomeprazole (NEXIUM) 40 MG capsule Take 1 capsule (40 mg total) by mouth  daily.   FLUoxetine (PROZAC) 20 MG capsule TAKE ONE (1) CAPSULE EACH DAY   fluticasone (FLONASE) 50 MCG/ACT nasal spray Place 2 sprays into both nostrils daily.   furosemide (LASIX) 20 MG tablet TAKE ONE (1) TABLET EACH DAY   ibuprofen (ADVIL) 600 MG tablet TAKE 1 TABLET BY MOUTH EVERY 8 HOURS AS NEEDED FOR PAIN   levocetirizine (XYZAL) 5 MG tablet Take 1 tablet (5 mg total) by mouth every evening. To replace Zyrtec   losartan-hydrochlorothiazide (HYZAAR) 100-25 MG tablet Take 1 tablet by mouth daily.   OVER THE COUNTER MEDICATION VIT C , VIT D, Zinc, MVI 50 plus - all one a day   Spacer/Aero-Holding Chambers (AEROCHAMBER MV) inhaler Use as instructed   spironolactone (ALDACTONE) 25 MG tablet TAKE ONE (1) TABLET EACH DAY   No facility-administered encounter medications on file as of 08/29/2022.    No Known Allergies  Review of Systems  Constitutional:  Positive for activity change and chills. Negative for appetite change, diaphoresis, fatigue, fever, unexpected weight change and weight loss.  HENT:  Positive for congestion, postnasal drip, rhinorrhea, sinus pressure and sinus pain. Negative for dental problem, drooling, ear discharge, ear pain, facial swelling, hearing loss, mouth sores, nosebleeds, sneezing, sore throat, tinnitus, trouble swallowing and voice change.   Eyes:  Negative for visual disturbance.  Respiratory:  Positive for cough. Negative for apnea, choking, chest tightness, shortness of breath, wheezing and stridor.   Cardiovascular:  Negative for chest pain.  Gastrointestinal:  Positive for abdominal pain. Negative for abdominal distention, anal bleeding, anorexia, blood in stool, constipation, diarrhea, flatus, hematochezia, melena, nausea, rectal pain and vomiting.  Genitourinary:  Negative for decreased urine volume, difficulty urinating, dysuria, enuresis, flank pain, frequency, genital sores, hematuria, penile discharge, penile pain, penile swelling, scrotal swelling,  testicular pain and urgency.  Musculoskeletal:  Negative for arthralgias, back pain, joint pain, myalgias and neck pain.  Skin:  Negative for rash.  Neurological:  Positive for headaches. Negative for dizziness, tremors, seizures, syncope, facial asymmetry, speech difficulty, weakness, light-headedness and numbness.  Psychiatric/Behavioral:  Negative for confusion.   All other systems reviewed and are negative.       Objective:  BP 129/79   Pulse 97   Temp 98.7 F (37.1 C)   Ht _0  (1.803 m)   Wt 283 lb (128.4 kg)   SpO2 92%   BMI 39.47 kg/m    Wt Readings from Last 3 Encounters:  08/29/22 283 lb (128.4 kg)  06/25/22 281 lb (127.5 kg)  06/19/22 281 lb (127.5 kg)    Physical Exam Vitals and nursing note reviewed.  Constitutional:      General: He is not in acute distress.    Appearance: Normal appearance. He is obese. He is not ill-appearing, toxic-appearing or diaphoretic.  HENT:     Head: Normocephalic and  atraumatic.     Right Ear: Hearing, ear canal and external ear normal. A middle ear effusion is present.     Left Ear: Hearing, ear canal and external ear normal. A middle ear effusion is present.     Nose: Congestion and rhinorrhea present.     Right Turbinates: Enlarged.     Left Turbinates: Enlarged.     Right Sinus: Maxillary sinus tenderness and frontal sinus tenderness present.     Left Sinus: Maxillary sinus tenderness and frontal sinus tenderness present.     Mouth/Throat:     Lips: Pink.     Mouth: Mucous membranes are moist.     Pharynx: Posterior oropharyngeal erythema present. No pharyngeal swelling, oropharyngeal exudate or uvula swelling.  Eyes:     General: Lids are normal.     Conjunctiva/sclera: Conjunctivae normal.     Pupils: Pupils are equal, round, and reactive to light.  Neck:     Trachea: Trachea and phonation normal.  Cardiovascular:     Rate and Rhythm: Normal rate and regular rhythm.     Heart sounds: Normal heart sounds.   Abdominal:     General: Abdomen is protuberant. Bowel sounds are normal.     Palpations: Abdomen is soft.     Tenderness: There is abdominal tenderness.     Hernia: A hernia is present. Hernia is present in the umbilical area.    Musculoskeletal:     Cervical back: Normal range of motion and neck supple.  Lymphadenopathy:     Cervical: No cervical adenopathy.  Neurological:     Mental Status: He is alert.  Psychiatric:        Behavior: Behavior is cooperative.     Results for orders placed or performed in visit on 02/24/22  CMP14+EGFR  Result Value Ref Range   Glucose 105 (H) 70 - 99 mg/dL   BUN 21 6 - 24 mg/dL   Creatinine, Ser 1.26 0.76 - 1.27 mg/dL   eGFR 67 >59 mL/min/1.73   BUN/Creatinine Ratio 17 9 - 20   Sodium 140 134 - 144 mmol/L   Potassium 3.9 3.5 - 5.2 mmol/L   Chloride 101 96 - 106 mmol/L   CO2 27 20 - 29 mmol/L   Calcium 9.9 8.7 - 10.2 mg/dL   Total Protein 7.3 6.0 - 8.5 g/dL   Albumin 4.7 3.8 - 4.9 g/dL   Globulin, Total 2.6 1.5 - 4.5 g/dL   Albumin/Globulin Ratio 1.8 1.2 - 2.2   Bilirubin Total 0.3 0.0 - 1.2 mg/dL   Alkaline Phosphatase 98 44 - 121 IU/L   AST 23 0 - 40 IU/L   ALT 36 0 - 44 IU/L  CBC with Differential/Platelet  Result Value Ref Range   WBC 7.8 3.4 - 10.8 x10E3/uL   RBC 5.32 4.14 - 5.80 x10E6/uL   Hemoglobin 15.2 13.0 - 17.7 g/dL   Hematocrit 46.3 37.5 - 51.0 %   MCV 87 79 - 97 fL   MCH 28.6 26.6 - 33.0 pg   MCHC 32.8 31.5 - 35.7 g/dL   RDW 12.1 11.6 - 15.4 %   Platelets 289 150 - 450 x10E3/uL   Neutrophils 50 Not Estab. %   Lymphs 36 Not Estab. %   Monocytes 11 Not Estab. %   Eos 2 Not Estab. %   Basos 1 Not Estab. %   Neutrophils Absolute 3.9 1.4 - 7.0 x10E3/uL   Lymphocytes Absolute 2.8 0.7 - 3.1 x10E3/uL   Monocytes Absolute 0.9 0.1 -  0.9 x10E3/uL   EOS (ABSOLUTE) 0.1 0.0 - 0.4 x10E3/uL   Basophils Absolute 0.0 0.0 - 0.2 x10E3/uL   Immature Granulocytes 0 Not Estab. %   Immature Grans (Abs) 0.0 0.0 - 0.1 x10E3/uL        Pertinent labs & imaging results that were available during my care of the patient were reviewed by me and considered in my medical decision making.  Assessment & Plan:  Kyle Burns was seen today for uri and abdominal pain.  Diagnoses and all orders for this visit:  Groin pain, left LLQ pain Urinalysis negative for blood and leukocytes in office. Small palpable mass noted on exam, will obtain US for further evaluation. Report new, worsening, or persistent symptoms. -     Urinalysis, Routine w reflex microscopic -     US Abdomen Limited; Future  Acute non-recurrent pansinusitis Ongoing for over 2 weeks despite symptomatic care. Will initiate Augmentin as prescribed. Continue symptomatic care as discussed. Report new, worsening, or persistent symptoms.  -     amoxicillin-clavulanate (AUGMENTIN) 875-125 MG tablet; Take 1 tablet by mouth 2 (two) times daily for 10 days.     Continue all other maintenance medications.  Follow up plan: Return if symptoms worsen or fail to improve.   Continue healthy lifestyle choices, including diet (rich in fruits, vegetables, and lean proteins, and low in salt and simple carbohydrates) and exercise (at least 30 minutes of moderate physical activity daily).   The above assessment and management plan was discussed with the patient. The patient verbalized understanding of and has agreed to the management plan. Patient is aware to call the clinic if they develop any new symptoms or if symptoms persist or worsen. Patient is aware when to return to the clinic for a follow-up visit. Patient educated on when it is appropriate to go to the emergency department.   Monia Pouch, FNP-C [facility] [phone]

## 2022-08-31 ENCOUNTER — Encounter: Payer: Self-pay | Admitting: Family Medicine

## 2022-09-01 ENCOUNTER — Ambulatory Visit: Payer: No Typology Code available for payment source | Admitting: Nurse Practitioner

## 2022-09-01 ENCOUNTER — Encounter: Payer: Self-pay | Admitting: Nurse Practitioner

## 2022-09-01 VITALS — BP 103/61 | HR 84 | Temp 97.8°F | Ht 71.0 in | Wt 300.0 lb

## 2022-09-01 DIAGNOSIS — R051 Acute cough: Secondary | ICD-10-CM

## 2022-09-01 DIAGNOSIS — R0989 Other specified symptoms and signs involving the circulatory and respiratory systems: Secondary | ICD-10-CM

## 2022-09-01 MED ORDER — BENZONATATE 100 MG PO CAPS
100.0000 mg | ORAL_CAPSULE | Freq: Three times a day (TID) | ORAL | 1 refills | Status: DC | PRN
Start: 2022-09-01 — End: 2022-09-24

## 2022-09-01 NOTE — Patient Instructions (Signed)

## 2022-09-01 NOTE — Progress Notes (Signed)
Acute Office Visit  Subjective:     Patient ID: Kyle Burns, male    DOB: June 14, 1966, 56 y.o.   MRN: [id number]  Chief Complaint  Patient presents with   Cough    This has been going on for a week now , mainly dry cough   Nasal Congestion    Cough This is a new problem. The problem has been unchanged. The problem occurs constantly. The cough is Non-productive. Associated symptoms include nasal congestion. Pertinent negatives include no chills, ear congestion, fever, hemoptysis or rash.  URI  This is a new problem. The current episode started in the past 7 days. The problem has been unchanged. There has been no fever. Associated symptoms include congestion and coughing. Pertinent negatives include no rash. Treatments tried: Started antibiotics 2 days ago.    Review of Systems  Constitutional: Negative.  Negative for chills and fever.  HENT:  Positive for congestion.   Respiratory:  Positive for cough. Negative for hemoptysis.   Cardiovascular: Negative.   Gastrointestinal: Negative.   Genitourinary: Negative.   Skin: Negative.  Negative for rash.  All other systems reviewed and are negative.       Objective:    BP 103/61   Pulse 84   Temp 97.8 F (36.6 C)   Ht 5\' 11"  (1.803 m)   Wt 300 lb (136.1 kg)   SpO2 95%   BMI 41.84 kg/m  BP Readings from Last 3 Encounters:  09/01/22 103/61  08/29/22 129/79  06/19/22 115/80   Wt Readings from Last 3 Encounters:  09/01/22 300 lb (136.1 kg)  08/29/22 283 lb (128.4 kg)  06/25/22 281 lb (127.5 kg)      Physical Exam Vitals and nursing note reviewed.  Constitutional:      Appearance: Normal appearance. He is obese.  HENT:     Head: Normocephalic.     Right Ear: External ear normal.     Left Ear: External ear normal.     Nose: Congestion present.     Mouth/Throat:     Mouth: Mucous membranes are moist.  Eyes:     Conjunctiva/sclera: Conjunctivae normal.  Cardiovascular:     Rate and Rhythm: Normal rate and  regular rhythm.     Pulses: Normal pulses.     Heart sounds: Normal heart sounds.  Pulmonary:     Effort: Pulmonary effort is normal.     Breath sounds: Normal breath sounds.  Abdominal:     General: Bowel sounds are normal.  Neurological:     General: No focal deficit present.     Mental Status: He is alert.     No results found for any visits on 09/01/22.      Assessment & Plan:  Patient was assessed for upper respiratory infection and cough in the last 48 hours.  Patient was started on amoxicillin 875-125 mg tablet by mouth twice daily for 10 days.  Advised patient to take all medication as prescribed - Use a cool mist humidifier  -Use saline nose sprays frequently -Force fluids -For fever or aches or pains- take Tylenol or ibuprofen. -tessalon pearls 100 mg tablet by mouth for cough -If symptoms do not improve, he may need to be COVID tested to rule this out Follow up with worsening unresolved symptoms  Problem List Items Addressed This Visit   None Visit Diagnoses     Acute cough    -  Primary   Relevant Medications   benzonatate (TESSALON PERLES) 100 MG  capsule   Chest congestion           Meds ordered this encounter  Medications   benzonatate (TESSALON PERLES) 100 MG capsule    Sig: Take 1 capsule (100 mg total) by mouth 3 (three) times daily as needed.    Dispense:  20 capsule    Refill:  1    Order Specific Question:   Supervising Provider    Answer:   Claretta Fraise [[id number]]    Return if symptoms worsen or fail to improve.  Ivy Lynn, NP

## 2022-09-02 ENCOUNTER — Other Ambulatory Visit: Payer: Self-pay | Admitting: Family Medicine

## 2022-09-02 DIAGNOSIS — J449 Chronic obstructive pulmonary disease, unspecified: Secondary | ICD-10-CM

## 2022-09-02 DIAGNOSIS — J069 Acute upper respiratory infection, unspecified: Secondary | ICD-10-CM

## 2022-09-02 DIAGNOSIS — J4489 Other specified chronic obstructive pulmonary disease: Secondary | ICD-10-CM

## 2022-09-02 MED ORDER — ALBUTEROL SULFATE HFA 108 (90 BASE) MCG/ACT IN AERS: INHALATION_SPRAY | RESPIRATORY_TRACT | 4 refills | Status: DC

## 2022-09-02 MED ORDER — HYDROCOD POLI-CHLORPHE POLI ER 10-8 MG/5ML PO SUER: 5.0000 mL | Freq: Two times a day (BID) | ORAL | 0 refills | Status: DC | PRN

## 2022-09-02 NOTE — Addendum Note (Signed)
Addended by: Baruch Gouty on: 09/02/2022 01:42 PM   Modules accepted: Orders

## 2022-09-10 ENCOUNTER — Encounter: Payer: Self-pay | Admitting: Family Medicine

## 2022-09-10 ENCOUNTER — Ambulatory Visit (HOSPITAL_COMMUNITY)
Admission: RE | Admit: 2022-09-10 | Discharge: 2022-09-10 | Disposition: A | Discharge status: Home / Self Care | Payer: No Typology Code available for payment source | Source: Ambulatory Visit | Attending: Family Medicine | Admitting: Family Medicine

## 2022-09-10 DIAGNOSIS — R1032 Left lower quadrant pain: Secondary | ICD-10-CM | POA: Diagnosis not present

## 2022-09-11 NOTE — Telephone Encounter (Signed)
Please review- U/s should not hav anything to do with cough

## 2022-09-11 NOTE — Telephone Encounter (Signed)
Hold for dr. Alinda Deem

## 2022-09-12 ENCOUNTER — Ambulatory Visit: Payer: No Typology Code available for payment source | Admitting: Nurse Practitioner

## 2022-09-12 ENCOUNTER — Encounter: Payer: Self-pay | Admitting: Family Medicine

## 2022-09-12 ENCOUNTER — Encounter: Payer: Self-pay | Admitting: Nurse Practitioner

## 2022-09-12 VITALS — BP 138/96 | HR 93 | Temp 98.7°F | Ht 71.0 in | Wt 293.0 lb

## 2022-09-12 DIAGNOSIS — R051 Acute cough: Secondary | ICD-10-CM

## 2022-09-12 MED ORDER — PSEUDOEPH-BROMPHEN-DM 30-2-10 MG/5ML PO SYRP: 5.0000 mL | ORAL_SOLUTION | Freq: Four times a day (QID) | ORAL | 0 refills | Status: DC | PRN

## 2022-09-12 MED ORDER — MONTELUKAST SODIUM 10 MG PO TABS: 10.0000 mg | ORAL_TABLET | Freq: Every day | ORAL | 3 refills | Status: DC

## 2022-09-12 NOTE — Addendum Note (Signed)
Addended by: Gerilyn Nestle on: 09/12/2022 03:52 PM   Modules accepted: Orders

## 2022-09-12 NOTE — Progress Notes (Signed)
Acute Office Visit  Subjective:     Patient ID: Kyle Burns, male    DOB: 07/07/1966, 56 y.o.   MRN: [id number]  Chief Complaint  Patient presents with   Cough    Has been going on for a while    Wheezing    Cough This is a new problem. The current episode started 1 to 4 weeks ago. The problem has been unchanged. The problem occurs constantly. The cough is Non-productive. Associated symptoms include rhinorrhea. Pertinent negatives include no chest pain, chills, ear congestion, ear pain, fever, heartburn, hemoptysis, myalgias, nasal congestion, rash, sore throat, shortness of breath or sweats. Nothing aggravates the symptoms. He has tried OTC inhaler, OTC cough suppressant and prescription cough suppressant for the symptoms. The treatment provided mild relief.    Review of Systems  Constitutional:  Negative for chills and fever.  HENT:  Positive for rhinorrhea. Negative for ear pain and sore throat.   Respiratory:  Positive for cough. Negative for hemoptysis and shortness of breath.   Cardiovascular:  Negative for chest pain.  Gastrointestinal:  Negative for heartburn.  Musculoskeletal:  Negative for myalgias.  Skin: Negative.  Negative for rash.  All other systems reviewed and are negative.       Objective:    BP (!) 138/96   Pulse 93   Temp 98.7 F (37.1 C)   Ht 5\' 11"  (1.803 m)   Wt 293 lb (132.9 kg)   SpO2 96%   BMI 40.87 kg/m  Wt Readings from Last 3 Encounters:  09/12/22 293 lb (132.9 kg)  09/01/22 300 lb (136.1 kg)  08/29/22 283 lb (128.4 kg)      Physical Exam Vitals and nursing note reviewed.  Constitutional:      Appearance: Normal appearance.  HENT:     Head: Normocephalic.     Right Ear: External ear normal.     Left Ear: External ear normal.     Nose: Nose normal. No congestion.     Mouth/Throat:     Mouth: Mucous membranes are moist.     Pharynx: Oropharynx is clear.  Eyes:     Conjunctiva/sclera: Conjunctivae normal.  Cardiovascular:      Rate and Rhythm: Normal rate and regular rhythm.     Pulses: Normal pulses.     Heart sounds: Normal heart sounds.  Pulmonary:     Effort: Pulmonary effort is normal.     Breath sounds: Normal breath sounds.  Abdominal:     General: Bowel sounds are normal.  Musculoskeletal:        General: Normal range of motion.  Skin:    Findings: No erythema or rash.  Neurological:     General: No focal deficit present.     Mental Status: He is alert and oriented to person, place, and time.  Psychiatric:        Mood and Affect: Mood normal.        Behavior: Behavior normal.     No results found for any visits on 09/12/22.      Assessment & Plan:  Unresolved cough no fever associated with current concerns. Take meds as prescribed - Use a cool mist humidifier  -Use saline nose sprays frequently -Force fluids -For fever or aches or pains- take Tylenol or ibuprofen. -Bromfed 5 ml for cough and allergic rhinitis -If symptoms do not improve, she may need to be COVID tested to rule this out Follow up with worsening unresolved symptoms  Problem List Items Addressed  This Visit   None Visit Diagnoses     Acute cough    -  Primary   Relevant Medications   brompheniramine-pseudoephedrine-DM 30-2-10 MG/5ML syrup   Other Relevant Orders   DG Chest 2 View       Meds ordered this encounter  Medications   brompheniramine-pseudoephedrine-DM 30-2-10 MG/5ML syrup    Sig: Take 5 mLs by mouth 4 (four) times daily as needed.    Dispense:  120 mL    Refill:  0    Order Specific Question:   Supervising Provider    Answer:   Mechele Claude [[id number]]    Return if symptoms worsen or fail to improve.  Daryll Drown, NP

## 2022-09-12 NOTE — Telephone Encounter (Signed)
U/s was for abdominal mass/ pain. Nothing to do with cough, which Je saw him for a few weeks ago.  Please get him on the DOD schedule today if able to get checked out.  May need CXR arranged.  Don't see where he was tested for COVID19 either.

## 2022-09-12 NOTE — Patient Instructions (Signed)

## 2022-09-15 ENCOUNTER — Other Ambulatory Visit (INDEPENDENT_AMBULATORY_CARE_PROVIDER_SITE_OTHER): Payer: No Typology Code available for payment source

## 2022-09-15 DIAGNOSIS — R051 Acute cough: Secondary | ICD-10-CM | POA: Diagnosis not present

## 2022-09-17 ENCOUNTER — Other Ambulatory Visit: Payer: Self-pay | Admitting: Nurse Practitioner

## 2022-09-17 ENCOUNTER — Encounter: Payer: Self-pay | Admitting: Nurse Practitioner

## 2022-09-17 DIAGNOSIS — J069 Acute upper respiratory infection, unspecified: Secondary | ICD-10-CM

## 2022-09-17 DIAGNOSIS — J4489 Other specified chronic obstructive pulmonary disease: Secondary | ICD-10-CM

## 2022-09-17 MED ORDER — GUAIFENESIN ER 600 MG PO TB12: 600.0000 mg | ORAL_TABLET | Freq: Two times a day (BID) | ORAL | 0 refills | Status: DC

## 2022-09-17 MED ORDER — ALBUTEROL SULFATE HFA 108 (90 BASE) MCG/ACT IN AERS: INHALATION_SPRAY | RESPIRATORY_TRACT | 4 refills | Status: DC

## 2022-09-17 NOTE — Telephone Encounter (Signed)
Elevated left hemidiaphragm with minimal left basilar subsegmental atelectasis. With your history of COPD and asthma this is not unusual. A mucolytic to help decongest cough and an incentive spirometer will be very helpful.  Guaifenesin sent to the pharmacy, continue albuterol breathing treatment as prescribed, incentive spirometer can be purchased from a medical supply store,  Follow up with unresolved symptoms, pulmonology referral may be needed for reassessment.

## 2022-09-18 ENCOUNTER — Other Ambulatory Visit: Payer: Self-pay | Admitting: Nurse Practitioner

## 2022-09-18 DIAGNOSIS — R9389 Abnormal findings on diagnostic imaging of other specified body structures: Secondary | ICD-10-CM

## 2022-09-18 NOTE — Telephone Encounter (Signed)
Has patient seen a lung doctor in the past? then it will not be a new referral. Patient will only need to call  to make an appointment to see the doctor. If this is a new referral he is seeking, let me know a preferred place and  will put in the referral

## 2022-09-18 NOTE — Telephone Encounter (Signed)
Referral completed

## 2022-09-19 NOTE — Telephone Encounter (Signed)
Patient sees Dr Melvyn Novas already.  Perhaps have him arrange an appt with him?

## 2022-09-24 ENCOUNTER — Ambulatory Visit: Payer: No Typology Code available for payment source | Admitting: Internal Medicine

## 2022-09-24 ENCOUNTER — Encounter: Payer: Self-pay | Admitting: Internal Medicine

## 2022-09-24 DIAGNOSIS — J4520 Mild intermittent asthma, uncomplicated: Secondary | ICD-10-CM | POA: Diagnosis not present

## 2022-09-24 MED ORDER — FAMOTIDINE 20 MG PO TABS: ORAL_TABLET | ORAL | 11 refills | Status: DC

## 2022-09-24 NOTE — Patient Instructions (Addendum)
Dr Halford Chessman next available to troubleshoot your bipap problems   Continue nexium but take it 30-60 before 1st meal and add  after supper  Pepcid 20 mg   GERD (REFLUX)  is an extremely common cause of respiratory symptoms just like yours , many times with no obvious heartburn at all.    It can be treated with medication, but also with lifestyle changes including elevation of the head of your bed (ideally with 6 -8inch blocks under the headboard of your bed),  Smoking cessation, avoidance of late meals, excessive alcohol, and avoid fatty foods, chocolate, peppermint, colas, red wine, and acidic juices such as orange juice.  NO MINT OR MENTHOL PRODUCTS SO NO COUGH DROPS  USE SUGARLESS CANDY INSTEAD (Jolley ranchers or Stover's or Life Savers) or even ice chips will also do - the key is to swallow to prevent all throat clearing. NO OIL BASED VITAMINS - use powdered substitutes.  Avoid fish oil when coughing.   Work on inhaler technique:  relax and gently blow all the way out then take a nice smooth full deep breath back in, triggering the inhaler at same time you start breathing in.  Hold breath in for at least  5 seconds if you can. Blow out symbicort out  thru nose. Rinse and gargle with water when done.  If mouth or throat bother you at all,  try brushing teeth/gums/tongue with arm and hammer toothpaste/ make a slurry and gargle and spit out.   Try delsym 2 tsp twice daily as needed for cough   Please remember to go to the lab department   for your tests - we will call you with the results when they are available.      Please schedule a follow up visit in 6 months but call sooner if needed    pr

## 2022-09-24 NOTE — Progress Notes (Signed)
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 Asthma as child no problem as adolescent  self referred to pulmonary clinic in [place]  04/02/2022  with documented L HD immobility and MO by BMI c/b OSA on cpap      History of Present Illness  04/02/2022  Pulmonary/ 1st office eval/ Kyle Burns / [facility] was maintained on symbicort previously but off x over  a month s change in symptoms  Chief Complaint  Patient presents with   New Patient (Initial Visit)    Has seen [name].  Feels asthma is going well.   Dyspnea:  not sure symbicort really helps  Cough: none  SABA use: last used a month prior to OV   Rec Symbicort 80 can be used up to 2 puffs  first thing in am and 2 puff every every 12 hours and then wean off after a week Only use your albuterol as a rescue medication Ok to try albuterol 15 min before an activity (on alternating days)  that you know would usually make you short of breath  Work on inhaler technique  05/22/22  [facility] eval for osa > rx Bipap /02  09/24/2022  f/u ov/[place] office/Neha Waight re: asthma maint on symbicort 80 2bid   Chief Complaint  Patient presents with   Follow-up    Cough is still bothering patient. Dry cough most of the time but sometimes productive   Dyspnea:  ok flat surface / problems with hills  = MMRC1 = can walk nl pace, flat grade, can't hurry or go uphills or steps s sob   Cough: worse with speaking or deep breath x one month as Sleeping: on Bipap  / 02  x 4h  feels rested but not getting enough pressure  Supine/ 4 pillows  has gained 10 lbs since starting  SABA use: qod  02: none daytime/  2lpm hs  Covid status: all but new one      No obvious day to day or daytime variability or assoc excess/ purulent sputum or mucus plugs or hemoptysis or cp or chest tightness, subjective wheeze or overt sinus or hb symptoms.     Also denies any obvious  fluctuation of symptoms with weather or environmental changes or other aggravating or alleviating factors except as outlined above   No unusual exposure hx or h/o childhood pna or knowledge of premature birth.  Current Allergies, Complete Past Medical History, Past Surgical History, Family History, and Social History were reviewed in [name] record.  ROS  The following are not active complaints unless bolded Hoarseness, sore throat, dysphagia, dental problems, itching, sneezing,  nasal congestion or discharge of excess mucus or purulent secretions, ear ache,   fever, chills, sweats, unintended wt loss or wt gain, classically pleuritic or exertional cp,  orthopnea pnd or arm/hand swelling  or leg swelling, presyncope, palpitations, abdominal pain, anorexia, nausea, vomiting, diarrhea  or change in bowel habits or change in bladder habits, change in stools or change in urine, dysuria, hematuria,  rash, arthralgias, visual complaints, headache, numbness, weakness or ataxia or problems with walking or coordination,  change in mood or  memory.        Current Meds  Medication Sig   albuterol (VENTOLIN HFA) 108 (90 Base) MCG/ACT inhaler USE 2 PUFFS EVERY 6 HOURS AS NEEDED FOR WHEEZING (1 for home, 1 for  work)   aspirin 81 MG EC tablet Take 162 mg by mouth daily.    atorvastatin (LIPITOR) 10 MG tablet Take 1 tablet (10 mg total) by mouth daily.   benzonatate (TESSALON PERLES) 100 MG capsule Take 1 capsule (100 mg total) by mouth 3 (three) times daily as needed.   brompheniramine-pseudoephedrine-DM 30-2-10 MG/5ML syrup Take 5 mLs by mouth 4 (four) times daily as needed.   budesonide-formoterol (SYMBICORT) 80-4.5 MCG/ACT inhaler Inhale 2 puffs into the lungs 2 (two) times daily.   esomeprazole (NEXIUM) 40 MG capsule Take 1 capsule (40 mg total) by mouth daily.   FLUoxetine (PROZAC) 20 MG capsule TAKE ONE (1) CAPSULE EACH DAY   fluticasone (FLONASE) 50 MCG/ACT nasal spray Place 2  sprays into both nostrils daily.   furosemide (LASIX) 20 MG tablet TAKE ONE (1) TABLET EACH DAY   guaiFENesin (MUCINEX) 600 MG 12 hr tablet Take 1 tablet (600 mg total) by mouth 2 (two) times daily.   ibuprofen (ADVIL) 600 MG tablet TAKE 1 TABLET BY MOUTH EVERY 8 HOURS AS NEEDED FOR PAIN   levocetirizine (XYZAL) 5 MG tablet Take 1 tablet (5 mg total) by mouth every evening. To replace Zyrtec   losartan-hydrochlorothiazide (HYZAAR) 100-25 MG tablet Take 1 tablet by mouth daily.   montelukast (SINGULAIR) 10 MG tablet Take 1 tablet (10 mg total) by mouth at bedtime.   OVER THE COUNTER MEDICATION VIT C , VIT D, Zinc, MVI 50 plus - all one a day   Spacer/Aero-Holding Chambers (AEROCHAMBER MV) inhaler Use as instructed   spironolactone (ALDACTONE) 25 MG tablet TAKE ONE (1) TABLET EACH DAY                      Past Medical History:  Diagnosis Date   Asthma    hx of    Back pain    Bilevel positive airway pressure (BPAP) dependence    GERD (gastroesophageal reflux disease)    HTN (hypertension)    Hyperlipidemia         Objective:    Wt Readings from Last 3 Encounters:  09/24/22 295 lb (133.8 kg)  09/12/22 293 lb (132.9 kg)  09/01/22 300 lb (136.1 kg)      Vital signs reviewed  09/24/2022  - Note at rest 02 sats  96% on RA   General appearance:    tensely obese bm nad harsh dry upper airway cough    HEENT : Oropharynx  clear      Nasal turbinates nl    NECK :  without  apparent JVD/ palpable Nodes/TM    LUNGS: no acc muscle use,  Nl contour chest which is clear to A and P bilaterally without cough on insp or exp maneuvers   CV:  RRR  no s3 or murmur or increase in P2, and no edema   ABD:  tensely obese but nontender with nl inspiratory excursion in the supine position. No bruits or organomegaly appreciated   MS:  Nl gait/ ext warm without deformities Or obvious joint restrictions  calf tenderness, cyanosis or clubbing    SKIN: warm and dry without lesions     NEURO:  alert, approp, nl sensorium with  no motor or cerebellar deficits apparent.          Assessment

## 2022-09-27 LAB — CBC WITH DIFFERENTIAL/PLATELET
Basophils Absolute: 0 10*3/uL (ref 0.0–0.2)
Basos: 1 %
EOS (ABSOLUTE): 0.1 10*3/uL (ref 0.0–0.4)
Eos: 1 %
Hematocrit: 44.5 % (ref 37.5–51.0)
Hemoglobin: 14.7 g/dL (ref 13.0–17.7)
Immature Grans (Abs): 0 10*3/uL (ref 0.0–0.1)
Immature Granulocytes: 0 %
Lymphocytes Absolute: 2.9 10*3/uL (ref 0.7–3.1)
Lymphs: 33 %
MCH: 28.8 pg (ref 26.6–33.0)
MCHC: 33 g/dL (ref 31.5–35.7)
MCV: 87 fL (ref 79–97)
Monocytes Absolute: 0.8 10*3/uL (ref 0.1–0.9)
Monocytes: 9 %
Neutrophils Absolute: 4.9 10*3/uL (ref 1.4–7.0)
Neutrophils: 56 %
Platelets: 321 10*3/uL (ref 150–450)
RBC: 5.11 x10E6/uL (ref 4.14–5.80)
RDW: 12.3 % (ref 11.6–15.4)
WBC: 8.8 10*3/uL (ref 3.4–10.8)

## 2022-09-27 LAB — IGE: IgE (Immunoglobulin E), Serum: 25 IU/mL (ref 6–495)

## 2022-10-03 ENCOUNTER — Other Ambulatory Visit: Payer: Self-pay | Admitting: Family Medicine

## 2022-10-03 DIAGNOSIS — H65193 Other acute nonsuppurative otitis media, bilateral: Secondary | ICD-10-CM

## 2022-10-03 DIAGNOSIS — F418 Other specified anxiety disorders: Secondary | ICD-10-CM

## 2022-10-03 NOTE — Telephone Encounter (Signed)
Patient has appointment scheduled for 01/12/2023 Kyle Burns pt and will need medication refilled for FLUOXETINE HCL 20 MG CAP and LEVOCETIRIZINE DIHYDROCHLORIDE 5 MG until his appointment date.  Please advise.

## 2022-10-03 NOTE — Telephone Encounter (Signed)
[facility]. NTBS last OV for Dx 11.22.22. RFs not sent

## 2022-10-06 MED ORDER — FLUOXETINE HCL 20 MG PO CAPS: ORAL_CAPSULE | ORAL | 0 refills | Status: DC

## 2022-10-06 MED ORDER — LEVOCETIRIZINE DIHYDROCHLORIDE 5 MG PO TABS: 5.0000 mg | ORAL_TABLET | Freq: Every evening | ORAL | 0 refills | Status: DC

## 2022-10-06 NOTE — Addendum Note (Signed)
Addended by: Antonietta Barcelona D on: 10/06/2022 10:29 AM   Modules accepted: Orders

## 2022-10-21 ENCOUNTER — Encounter: Payer: Self-pay | Admitting: Nurse Practitioner

## 2022-10-21 ENCOUNTER — Telehealth: Payer: No Typology Code available for payment source | Admitting: Nurse Practitioner

## 2022-10-21 DIAGNOSIS — J0110 Acute frontal sinusitis, unspecified: Secondary | ICD-10-CM | POA: Diagnosis not present

## 2022-10-21 MED ORDER — AZITHROMYCIN 250 MG PO TABS: ORAL_TABLET | ORAL | 0 refills | Status: AC

## 2022-10-21 NOTE — Patient Instructions (Signed)

## 2022-10-21 NOTE — Progress Notes (Signed)
   Virtual Visit  Note Due to COVID-19 pandemic this visit was conducted virtually. This visit type was conducted due to national recommendations for restrictions regarding the COVID-19 Pandemic (e.g. social distancing, sheltering in place) in an effort to limit this patient's exposure and mitigate transmission in our community. All issues noted in this document were discussed and addressed.  A physical exam was not performed with this format.  I connected with Kyle Burns on 10/21/22 at 12:50 pm  by telephone and verified that I am speaking with the correct person using two identifiers. Kyle Burns is currently located at home during visit. The provider, Ivy Lynn, NP is located in their office at time of visit.  I discussed the limitations, risks, security and privacy concerns of performing an evaluation and management service by telephone and the availability of in person appointments. I also discussed with the patient that there may be a patient responsible charge related to this service. The patient expressed understanding and agreed to proceed.   History and Present Illness:  Sinusitis This is a new problem. Episode onset: in the past 3-4 days. The problem is unchanged. There has been no fever. Associated symptoms include congestion, coughing and sinus pressure. Pertinent negatives include no diaphoresis. Past treatments include oral decongestants and acetaminophen. The treatment provided no relief.      Review of Systems  Constitutional:  Negative for diaphoresis and fever.  HENT:  Positive for congestion and sinus pressure.   Eyes: Negative.   Respiratory:  Positive for cough.   Cardiovascular: Negative.   Genitourinary: Negative.   Musculoskeletal: Negative.   Skin: Negative.  Negative for rash.  All other systems reviewed and are negative.    Observations/Objective: Televisit patient not in distress  Assessment and Plan: Patient presents with symptoms of  upper respiratory infection cough congestion and sinus pressure.. Take meds as prescribed - Use a cool mist humidifier  -Use saline nose sprays frequently -Force fluids -For fever or aches or pains- take Tylenol or ibuprofen. -If symptoms do not improve, he may need to be COVID tested to rule this out Follow up with worsening unresolved symptoms   Follow Up Instructions: Follow up with unresolved symptoms    I discussed the assessment and treatment plan with the patient. The patient was provided an opportunity to ask questions and all were answered. The patient agreed with the plan and demonstrated an understanding of the instructions.   The patient was advised to call back or seek an in-person evaluation if the symptoms worsen or if the condition fails to improve as anticipated.  The above assessment and management plan was discussed with the patient. The patient verbalized understanding of and has agreed to the management plan. Patient is aware to call the clinic if symptoms persist or worsen. Patient is aware when to return to the clinic for a follow-up visit. Patient educated on when it is appropriate to go to the emergency department.   Time call ended:  1:12 pm   I provided 12 minutes of  non face-to-face time during this encounter.    Ivy Lynn, NP

## 2022-10-24 ENCOUNTER — Ambulatory Visit: Payer: No Typology Code available for payment source | Admitting: Internal Medicine

## 2022-10-29 ENCOUNTER — Ambulatory Visit: Payer: No Typology Code available for payment source | Admitting: Pulmonary Disease

## 2022-10-31 ENCOUNTER — Encounter: Payer: Self-pay | Admitting: Pulmonary Disease

## 2022-10-31 ENCOUNTER — Ambulatory Visit: Payer: No Typology Code available for payment source | Admitting: Pulmonary Disease

## 2022-10-31 VITALS — BP 134/78 | HR 74 | Temp 98.2°F | Ht 71.0 in | Wt 299.6 lb

## 2022-10-31 DIAGNOSIS — J4520 Mild intermittent asthma, uncomplicated: Secondary | ICD-10-CM | POA: Diagnosis not present

## 2022-10-31 DIAGNOSIS — G4733 Obstructive sleep apnea (adult) (pediatric): Secondary | ICD-10-CM | POA: Diagnosis not present

## 2022-10-31 DIAGNOSIS — J986 Disorders of diaphragm: Secondary | ICD-10-CM | POA: Diagnosis not present

## 2022-10-31 DIAGNOSIS — J9611 Chronic respiratory failure with hypoxia: Secondary | ICD-10-CM

## 2022-10-31 NOTE — Progress Notes (Signed)
[facility]  Chief Complaint  Patient presents with   Follow-up    Bipap working well      Past Surgical History:  Kyle Burns  has a past surgical history that includes bilateral hernia repair and Cyst removal neck.  Past Medical History:  Asthma, Back pain, GERD, HTN, HLD  Constitutional:  BP 134/78   Pulse 74   Temp 98.2 F (36.8 C)   Ht 5\' 11"  (1.803 m)   Wt 299 lb 9.6 oz (135.9 kg)   SpO2 96% Comment: ra  BMI 41.79 kg/m   Brief Summary:  Kyle Burns is a 56 y.o. 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      Subjective:   Kyle Burns had a cold in October and asthma symptoms caused some trouble.  Better since then.  Uses Bipap nightly.  No issues with mask fit.  Got a new oxygen concentrator.  Kyle Burns gets about 5.5 to 6 hours sleep per night due to his work schedule.  Physical Exam:   Appearance - well kempt   ENMT - no sinus tenderness, no oral exudate, no LAN, Mallampati 3 airway, no stridor  Respiratory - equal breath sounds bilaterally, no wheezing or rales  CV - s1s2 regular rate and rhythm, no murmurs  Ext - no clubbing, no edema  Skin - no rashes  Psych - normal mood and affect    Pulmonary testing:  PFT 06/04/18 >> FEV1 2.11 (62%), FEV1% 89, TLC 4.89 (68%), DLCO 62%  Chest Imaging:  Sniff test 06/20/14 >> paralyzed Lt diaphragm  Sleep Tests:  PSG 05/17/15 >> AHI 29.5, SpO2 low 61% Bipap 05/12/18 >> Bipap 17/13 cm H2O, 2 liters oxygen Bipap 10/01/22 to 10/30/22 >> used on 30 of 30 nights with average 5 hrs 39 min.  Average  AHI 0.3 with Bipap 17/13 cm H2O  Cardiac Tests:  Echo 04/23/18 >> EF 60 to 65%, mild LVH  Social History:  Kyle Burns  reports that Kyle Burns has never smoked. Kyle Burns quit smokeless tobacco use about 3 years ago.  His smokeless tobacco use included  chew. Kyle Burns reports that Kyle Burns does not drink alcohol and does not use drugs.  Family History:  His family history includes Arthritis in his father and mother; Asthma in his father, maternal grandfather, and paternal grandfather; Cancer in his father; Crohn's disease in his mother; Diabetes in his maternal grandfather and mother; Hypertension in his maternal grandmother and sister; Multiple sclerosis in his brother; Stroke in his maternal grandfather.     Assessment/Plan:   Obstructive sleep apnea. 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 - uses 2 liters oxygen at night with Bipap  Insufficient sleep. - related to his work schedule - discussed techniques to optimize quality of his naps - Kyle Burns will try to allow more time for sleep  Asthma. - stable at present - has more trouble when Kyle Burns gets a respiratory infection  Time Spent Involved in Patient Care on Day of Examination:  27 minutes  Follow up:   Patient Instructions  Follow up in 1 year  Medication List:   Allergies as of 10/31/2022  No Known Allergies      Medication List        Accurate as of October 31, 2022 11:52 AM. If you have any questions, ask your nurse or doctor.          AeroChamber MV inhaler Use as instructed   albuterol 108 (90 Base) MCG/ACT inhaler Commonly known as: Ventolin HFA USE 2 PUFFS EVERY 6 HOURS AS NEEDED FOR WHEEZING (1 for home, 1 for work)   aspirin EC 81 MG tablet Take 162 mg by mouth daily.   atorvastatin 10 MG tablet Commonly known as: LIPITOR Take 1 tablet (10 mg total) by mouth daily.   brompheniramine-pseudoephedrine-DM 30-2-10 MG/5ML syrup Take 5 mLs by mouth 4 (four) times daily as needed.   budesonide-formoterol 80-4.5 MCG/ACT inhaler Commonly known as:  Symbicort Inhale 2 puffs into the lungs 2 (two) times daily.   esomeprazole 40 MG capsule Commonly known as: NexIUM Take 1 capsule (40 mg total) by mouth daily.   famotidine 20 MG tablet Commonly known as: Pepcid One after supper   FLUoxetine 20 MG capsule Commonly known as: PROZAC TAKE ONE (1) CAPSULE EACH DAY   fluticasone 50 MCG/ACT nasal spray Commonly known as: FLONASE Place 2 sprays into both nostrils daily.   furosemide 20 MG tablet Commonly known as: LASIX TAKE ONE (1) TABLET EACH DAY   guaiFENesin 600 MG 12 hr tablet Commonly known as: Mucinex Take 1 tablet (600 mg total) by mouth 2 (two) times daily.   ibuprofen 600 MG tablet Commonly known as: ADVIL TAKE 1 TABLET BY MOUTH EVERY 8 HOURS AS NEEDED FOR PAIN   levocetirizine 5 MG tablet Commonly known as: XYZAL Take 1 tablet (5 mg total) by mouth every evening. To replace Zyrtec   losartan-hydrochlorothiazide 100-25 MG tablet Commonly known as: HYZAAR Take 1 tablet by mouth daily.   montelukast 10 MG tablet Commonly known as: SINGULAIR Take 1 tablet (10 mg total) by mouth at bedtime.   OVER THE COUNTER MEDICATION VIT C , VIT D, Zinc, MVI 50 plus - all one a day   spironolactone 25 MG tablet Commonly known as: ALDACTONE TAKE ONE (1) TABLET EACH DAY        Signature:  Coralyn Helling, MD [facility] Pager - [phone] 10/31/2022, 11:52 AM

## 2022-10-31 NOTE — Patient Instructions (Signed)
Follow up in 1 year.

## 2022-11-03 ENCOUNTER — Encounter: Payer: Self-pay | Admitting: Family Medicine

## 2022-11-04 NOTE — Telephone Encounter (Signed)
Defer to PCP

## 2022-11-12 ENCOUNTER — Ambulatory Visit: Payer: No Typology Code available for payment source | Admitting: Pulmonary Disease

## 2022-11-14 ENCOUNTER — Ambulatory Visit: Payer: No Typology Code available for payment source | Admitting: Internal Medicine

## 2022-11-14 ENCOUNTER — Encounter: Payer: Self-pay | Admitting: Internal Medicine

## 2022-11-14 VITALS — BP 112/78 | HR 78 | Temp 98.3°F | Ht 71.0 in | Wt 295.2 lb

## 2022-11-14 DIAGNOSIS — J4520 Mild intermittent asthma, uncomplicated: Secondary | ICD-10-CM

## 2022-11-14 MED ORDER — BENZONATATE 200 MG PO CAPS: 200.0000 mg | ORAL_CAPSULE | Freq: Three times a day (TID) | ORAL | 1 refills | Status: DC | PRN

## 2022-11-14 MED ORDER — METHYLPREDNISOLONE ACETATE 80 MG/ML IJ SUSP
120.0000 mg | Freq: Once | INTRAMUSCULAR | Status: AC
  Administered 2022-11-14: 120 mg via INTRAMUSCULAR

## 2022-11-14 NOTE — Assessment & Plan Note (Addendum)
Onset in childhood ?  / played sports fine s needing saba - 04/02/2022    symbicort 80 2bid  - 09/24/2022  After extensive coaching inhaler device,  effectiveness =   80% HFa> continue symbicort and max rx for gerd - Allergy screen 09/24/2022 >  Eos 0.1 /  IgE 25     No better with symbicort in terms of cough /sob better. 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   These are the same pts (now being labeled as having "irritable larynx syndrome" by some cough centers) who not infrequently have a history of having failed to tolerate ace inhibitors,  dry powder inhalers or biphosphonates or report having atypical/extraesophageal reflux symptoms that don't respond to standard doses of PPI  and are easily confused as having aecopd or asthma flares by even experienced allergists/ pulmonologists (myself included).   Rec Depomedrol 120 mg IM and leave off symbicort/ ok to continue singulair for now Max rx for gerd including use of hard rock candy to prevent throat clearing  Add 1st gen H1 blockers per guidelines  Tessalon 200 to suppress cyclical cough and then add gabapentin In can't cotnrol  F/u  6 weeks with all meds in hand using a trust but verify approach to confirm accurate Medication  Reconciliation The principal here is that until we are certain that the  patients are doing what we've asked, it makes no sense to ask them to do more.          Each maintenance medication was reviewed in detail including emphasizing most  importantly the difference between maintenance and prns and under what circumstances the prns are to be triggered using an action plan format where appropriate.  Total time for H and P, chart review, counseling, reviewing hfa device(s) and generating customized AVS unique to this office visit / same day charting  > 30 min for multiple  refractory respiratory  symptoms of uncertain etiology

## 2022-11-14 NOTE — Progress Notes (Signed)
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 Asthma as child no problem as adolescent  self referred to pulmonary clinic in [place]  04/02/2022  with documented L HD immobility and MO by BMI c/b OSA on cpap      History of Present Illness  04/02/2022  Pulmonary/ 1st office eval/ Suyash Amory / [facility] [facility] was maintained on symbicort previously but off x over  a month s change in symptoms  Chief Complaint  Patient presents with   New Patient (Initial Visit)    Has seen [name].  Feels asthma is going well.   Dyspnea:  not sure symbicort really helps  Cough: none  SABA use: last used a month prior to OV   Rec Symbicort 80 can be used up to 2 puffs  first thing in am and 2 puff every every 12 hours and then wean off after a week Only use your albuterol as a rescue medication Ok to try albuterol 15 min before an activity (on alternating days)  that you know would usually make you short of breath  Work on inhaler technique  05/22/22  [facility] eval for osa > rx Bipap /02  09/24/2022  f/u ov/[facility]/Charmayne Odell re: asthma maint on symbicort 80 2bid   Chief Complaint  Patient presents with   Follow-up    Cough is still bothering patient. Dry cough most of the time but sometimes productive   Dyspnea:  ok flat surface / problems with hills  = MMRC1 = can walk nl pace, flat grade, can't hurry or go uphills or steps s sob   Cough: worse with speaking or deep breath x one month   Sleeping: on Bipap  / 02  x 4h  feels rested but not getting enough pressure  Supine/ 4 pillows  has gained 10 lbs since starting  SABA use: qod  02: none daytime/  2lpm hs  Covid status: all but new one  Rec Dr Craige Cotta next available to troubleshoot your bipap problems Continue nexium but take it 30-60 before 1st meal and add  after supper  Pepcid 20 mg  GERD diet Work on inhaler technique:   Try delsym 2 tsp twice daily as  needed for cough   Allergy screen 09/24/2022 >  Eos 0.1 /  IgE 25       11/14/2022  f/u ov/Jalexia Lalli re: doe/cough maint   off symbicort x one week and cough maybe some better / convinced this  is pnds triggered  Chief Complaint  Patient presents with   Follow-up    Cough persistent  Dyspnea:  no change ex tol on vs off symbicort Cough: mostly dry/ sense of pnds worse before supper and at bedime  Sleeping: not disturbing sleep / bed= flat waterbed / 2 pillows  SABA use: none  02: for bipap   Covid status:   vax x last one,? If ever infected   No obvious day to day or daytime variability or assoc excess/ purulent sputum or mucus plugs or hemoptysis or cp or chest tightness, subjective wheeze or overt sinus or hb symptoms.     Also denies any obvious fluctuation of symptoms with weather or environmental changes or other aggravating or alleviating factors except as outlined above   No unusual exposure hx or h/o childhood pna/ asthma or knowledge of premature birth.  Current Allergies, Complete Past  Medical History, Past Surgical History, Family History, and Social History were reviewed in [name] record.  ROS  The following are not active complaints unless bolded Hoarseness, sore throat, dysphagia, dental problems, itching, sneezing,  nasal congestion or discharge of excess mucus or purulent secretions, ear ache,   fever, chills, sweats, unintended wt loss or wt gain, classically pleuritic or exertional cp,  orthopnea pnd or arm/hand swelling  or leg swelling, presyncope, palpitations, abdominal pain, anorexia, nausea, vomiting, diarrhea  or change in bowel habits or change in bladder habits, change in stools or change in urine, dysuria, hematuria,  rash, arthralgias, visual complaints, headache, numbness, weakness or ataxia or problems with walking or coordination,  change in mood or  memory.        Current Meds  Medication Sig   albuterol (VENTOLIN HFA) 108 (90 Base)  MCG/ACT inhaler USE 2 PUFFS EVERY 6 HOURS AS NEEDED FOR WHEEZING (1 for home, 1 for work)   aspirin 81 MG EC tablet Take 162 mg by mouth daily.    atorvastatin (LIPITOR) 10 MG tablet Take 1 tablet (10 mg total) by mouth daily.   brompheniramine-pseudoephedrine-DM 30-2-10 MG/5ML syrup Take 5 mLs by mouth 4 (four) times daily as needed.   esomeprazole (NEXIUM) 40 MG capsule Take 1 capsule (40 mg total) by mouth daily.   famotidine (PEPCID) 20 MG tablet One after supper   FLUoxetine (PROZAC) 20 MG capsule TAKE ONE (1) CAPSULE EACH DAY   fluticasone (FLONASE) 50 MCG/ACT nasal spray Place 2 sprays into both nostrils daily.   furosemide (LASIX) 20 MG tablet TAKE ONE (1) TABLET EACH DAY   guaiFENesin (MUCINEX) 600 MG 12 hr tablet Take 1 tablet (600 mg total) by mouth 2 (two) times daily.   ibuprofen (ADVIL) 600 MG tablet TAKE 1 TABLET BY MOUTH EVERY 8 HOURS AS NEEDED FOR PAIN   levocetirizine (XYZAL) 5 MG tablet Take 1 tablet (5 mg total) by mouth every evening. To replace Zyrtec   losartan-hydrochlorothiazide (HYZAAR) 100-25 MG tablet Take 1 tablet by mouth daily.   montelukast (SINGULAIR) 10 MG tablet Take 1 tablet (10 mg total) by mouth at bedtime.   OVER THE COUNTER MEDICATION VIT C , VIT D, Zinc, MVI 50 plus - all one a day   Spacer/Aero-Holding Chambers (AEROCHAMBER MV) inhaler Use as instructed   spironolactone (ALDACTONE) 25 MG tablet TAKE ONE (1) TABLET EACH DAY            Past Medical History:  Diagnosis Date   Asthma    hx of    Back pain    Bilevel positive airway pressure (BPAP) dependence    GERD (gastroesophageal reflux disease)    HTN (hypertension)    Hyperlipidemia         Objective:     11/14/2022      295   09/24/22 295 lb (133.8 kg)  09/12/22 293 lb (132.9 kg)  09/01/22 300 lb (136.1 kg)     Vital signs reviewed  11/14/2022  - Note at rest 02 sats  98% on RA   General appearance:    MO (by BMI) amb bm nad    HEENT : Oropharynx  clear/no  pnd  or  cobblestoning viz       Nasal turbinates mild edema   NECK :  without  apparent JVD/ palpable Nodes/TM    LUNGS: no acc muscle use,  Nl contour chest which is clear to A and P bilaterally without cough on insp  or exp maneuvers   CV:  RRR  no s3 or murmur or increase in P2, and no edema   ABD:  obese soft and nontender   MS:  Nl gait/ ext warm without deformities Or obvious joint restrictions  calf tenderness, cyanosis or clubbing    SKIN: warm and dry without lesions    NEURO:  alert, approp, nl sensorium with  no motor or cerebellar deficits apparent.             Assessment

## 2022-11-14 NOTE — Patient Instructions (Addendum)
Depomedrol 120 mg IM   Stay on nexium and pepcid as you are   GERD (REFLUX)  is an extremely common cause of respiratory symptoms just like yours , many times with no obvious heartburn at all.    It can be treated with medication, but also with lifestyle changes including elevation of the head of your bed (ideally with 6 -8inch blocks under the headboard of your bed),  Smoking cessation, avoidance of late meals, excessive alcohol, and avoid fatty foods, chocolate, peppermint, colas, red wine, and acidic juices such as orange juice.  NO MINT OR MENTHOL PRODUCTS SO NO COUGH DROPS  USE SUGARLESS CANDY INSTEAD (Jolley ranchers or Stover's or Life Savers) or even ice chips will also do - the key is to swallow to prevent all throat clearing. NO OIL BASED VITAMINS - use powdered substitutes.  Avoid fish oil when coughing.   For drainage / throat tickle try take CHLORPHENIRAMINE  4 mg  ("Allergy Relief" 4mg   at [facility] should be easiest to find in the blue box (equate is green)  usually on bottom shelf)  take one every 4 hours as needed - extremely effective and inexpensive over the counter- may cause drowsiness so start with just a dose or two an hour before bedtime and see how you tolerate it before trying in daytime.   Stop symicort and just use albuterol as needed for now  Please schedule a follow up office visit in 6 weeks, call sooner if needed with all medications /inhalers/ solutions in hand so we can verify exactly what you are taking. This includes all medications from all doctors and over the counters   Late add: if not improving > tessalon 200

## 2022-11-14 NOTE — Assessment & Plan Note (Signed)
Onset in childhood ?  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    These are the same pts (now being labeled as having "irritable larynx syndrome" by some cough centers) who not infrequently have a history of having failed to tolerate ace inhibitors,  dry powder inhalers or biphosphonates or report having atypical/extraesophageal reflux symptoms that don't respond to standard doses of PPI  and are easily confused as having aecopd or asthma flares by even experienced allergists/ pulmonologists (myself included).      Will rx for GERD and asthma by working harder on diet/ optimal hf and suppressing urge to clear throat with hard rock candy   F/u can be q 6 m with f/u by sleep med in meantime           Each maintenance medication was reviewed in detail including emphasizing most importantly the difference between maintenance and prns and under what circumstances the prns are to be triggered using an action plan format where appropriate.   Total time for H and P, chart review, counseling, reviewing hfa device(s) and generating customized AVS unique to this office visit / same day charting > 30 min

## 2022-11-22 ENCOUNTER — Encounter (HOSPITAL_COMMUNITY)
Admission: EM | Disposition: A | Discharge status: Home / Self Care | Payer: Self-pay | Source: Home / Self Care | Attending: Internal Medicine

## 2022-11-22 ENCOUNTER — Emergency Department (HOSPITAL_COMMUNITY): Payer: No Typology Code available for payment source

## 2022-11-22 ENCOUNTER — Encounter (HOSPITAL_COMMUNITY): Payer: Self-pay | Admitting: Internal Medicine

## 2022-11-22 ENCOUNTER — Inpatient Hospital Stay (HOSPITAL_COMMUNITY): Payer: No Typology Code available for payment source | Admitting: Anesthesiology

## 2022-11-22 ENCOUNTER — Other Ambulatory Visit: Payer: Self-pay

## 2022-11-22 ENCOUNTER — Inpatient Hospital Stay (HOSPITAL_COMMUNITY)
Admission: EM | Admit: 2022-11-22 | Discharge: 2022-12-07 | DRG: 353 | Disposition: A | Discharge status: Home / Self Care | Payer: No Typology Code available for payment source | Attending: Internal Medicine | Admitting: Internal Medicine

## 2022-11-22 DIAGNOSIS — Z833 Family history of diabetes mellitus: Secondary | ICD-10-CM

## 2022-11-22 DIAGNOSIS — Z79899 Other long term (current) drug therapy: Secondary | ICD-10-CM

## 2022-11-22 DIAGNOSIS — J9601 Acute respiratory failure with hypoxia: Secondary | ICD-10-CM | POA: Diagnosis not present

## 2022-11-22 DIAGNOSIS — I10 Essential (primary) hypertension: Secondary | ICD-10-CM | POA: Diagnosis present

## 2022-11-22 DIAGNOSIS — R911 Solitary pulmonary nodule: Secondary | ICD-10-CM | POA: Diagnosis not present

## 2022-11-22 DIAGNOSIS — L7632 Postprocedural hematoma of skin and subcutaneous tissue following other procedure: Secondary | ICD-10-CM | POA: Diagnosis not present

## 2022-11-22 DIAGNOSIS — J189 Pneumonia, unspecified organism: Secondary | ICD-10-CM | POA: Diagnosis not present

## 2022-11-22 DIAGNOSIS — Z7951 Long term (current) use of inhaled steroids: Secondary | ICD-10-CM

## 2022-11-22 DIAGNOSIS — E662 Morbid (severe) obesity with alveolar hypoventilation: Secondary | ICD-10-CM | POA: Diagnosis present

## 2022-11-22 DIAGNOSIS — Z6841 Body Mass Index (BMI) 40.0 and over, adult: Secondary | ICD-10-CM | POA: Diagnosis not present

## 2022-11-22 DIAGNOSIS — G4733 Obstructive sleep apnea (adult) (pediatric): Secondary | ICD-10-CM | POA: Diagnosis present

## 2022-11-22 DIAGNOSIS — K91870 Postprocedural hematoma of a digestive system organ or structure following a digestive system procedure: Secondary | ICD-10-CM | POA: Diagnosis not present

## 2022-11-22 DIAGNOSIS — Z9981 Dependence on supplemental oxygen: Secondary | ICD-10-CM

## 2022-11-22 DIAGNOSIS — R112 Nausea with vomiting, unspecified: Secondary | ICD-10-CM | POA: Diagnosis not present

## 2022-11-22 DIAGNOSIS — J9602 Acute respiratory failure with hypercapnia: Secondary | ICD-10-CM | POA: Diagnosis not present

## 2022-11-22 DIAGNOSIS — Y95 Nosocomial condition: Secondary | ICD-10-CM | POA: Diagnosis present

## 2022-11-22 DIAGNOSIS — Z8249 Family history of ischemic heart disease and other diseases of the circulatory system: Secondary | ICD-10-CM

## 2022-11-22 DIAGNOSIS — E782 Mixed hyperlipidemia: Secondary | ICD-10-CM | POA: Diagnosis present

## 2022-11-22 DIAGNOSIS — I4819 Other persistent atrial fibrillation: Secondary | ICD-10-CM | POA: Diagnosis not present

## 2022-11-22 DIAGNOSIS — Z823 Family history of stroke: Secondary | ICD-10-CM

## 2022-11-22 DIAGNOSIS — J4520 Mild intermittent asthma, uncomplicated: Secondary | ICD-10-CM | POA: Diagnosis not present

## 2022-11-22 DIAGNOSIS — K420 Umbilical hernia with obstruction, without gangrene: Principal | ICD-10-CM | POA: Diagnosis present

## 2022-11-22 DIAGNOSIS — K56609 Unspecified intestinal obstruction, unspecified as to partial versus complete obstruction: Principal | ICD-10-CM

## 2022-11-22 DIAGNOSIS — K9189 Other postprocedural complications and disorders of digestive system: Secondary | ICD-10-CM | POA: Diagnosis not present

## 2022-11-22 DIAGNOSIS — N179 Acute kidney failure, unspecified: Secondary | ICD-10-CM | POA: Diagnosis not present

## 2022-11-22 DIAGNOSIS — I9789 Other postprocedural complications and disorders of the circulatory system, not elsewhere classified: Secondary | ICD-10-CM | POA: Diagnosis not present

## 2022-11-22 DIAGNOSIS — K567 Ileus, unspecified: Secondary | ICD-10-CM | POA: Diagnosis not present

## 2022-11-22 DIAGNOSIS — I952 Hypotension due to drugs: Secondary | ICD-10-CM | POA: Diagnosis not present

## 2022-11-22 DIAGNOSIS — J9621 Acute and chronic respiratory failure with hypoxia: Secondary | ICD-10-CM | POA: Diagnosis not present

## 2022-11-22 DIAGNOSIS — E876 Hypokalemia: Secondary | ICD-10-CM | POA: Diagnosis not present

## 2022-11-22 DIAGNOSIS — J986 Disorders of diaphragm: Secondary | ICD-10-CM | POA: Diagnosis present

## 2022-11-22 DIAGNOSIS — I4891 Unspecified atrial fibrillation: Secondary | ICD-10-CM

## 2022-11-22 DIAGNOSIS — E785 Hyperlipidemia, unspecified: Secondary | ICD-10-CM | POA: Diagnosis present

## 2022-11-22 DIAGNOSIS — J9811 Atelectasis: Secondary | ICD-10-CM | POA: Diagnosis present

## 2022-11-22 DIAGNOSIS — I4510 Unspecified right bundle-branch block: Secondary | ICD-10-CM | POA: Diagnosis present

## 2022-11-22 DIAGNOSIS — T4275XA Adverse effect of unspecified antiepileptic and sedative-hypnotic drugs, initial encounter: Secondary | ICD-10-CM | POA: Diagnosis not present

## 2022-11-22 DIAGNOSIS — I97191 Other postprocedural cardiac functional disturbances following other surgery: Secondary | ICD-10-CM | POA: Diagnosis not present

## 2022-11-22 DIAGNOSIS — J1569 Pneumonia due to other gram-negative bacteria: Secondary | ICD-10-CM | POA: Diagnosis not present

## 2022-11-22 DIAGNOSIS — E441 Mild protein-calorie malnutrition: Secondary | ICD-10-CM | POA: Diagnosis not present

## 2022-11-22 DIAGNOSIS — A419 Sepsis, unspecified organism: Secondary | ICD-10-CM | POA: Diagnosis not present

## 2022-11-22 DIAGNOSIS — Z23 Encounter for immunization: Secondary | ICD-10-CM | POA: Diagnosis not present

## 2022-11-22 DIAGNOSIS — K219 Gastro-esophageal reflux disease without esophagitis: Secondary | ICD-10-CM | POA: Diagnosis present

## 2022-11-22 DIAGNOSIS — Z825 Family history of asthma and other chronic lower respiratory diseases: Secondary | ICD-10-CM

## 2022-11-22 DIAGNOSIS — Z7982 Long term (current) use of aspirin: Secondary | ICD-10-CM

## 2022-11-22 DIAGNOSIS — J440 Chronic obstructive pulmonary disease with acute lower respiratory infection: Secondary | ICD-10-CM | POA: Diagnosis not present

## 2022-11-22 DIAGNOSIS — J984 Other disorders of lung: Secondary | ICD-10-CM | POA: Diagnosis present

## 2022-11-22 DIAGNOSIS — R6521 Severe sepsis with septic shock: Secondary | ICD-10-CM | POA: Diagnosis not present

## 2022-11-22 DIAGNOSIS — D649 Anemia, unspecified: Secondary | ICD-10-CM | POA: Diagnosis present

## 2022-11-22 DIAGNOSIS — K660 Peritoneal adhesions (postprocedural) (postinfection): Secondary | ICD-10-CM | POA: Diagnosis present

## 2022-11-22 DIAGNOSIS — I480 Paroxysmal atrial fibrillation: Secondary | ICD-10-CM | POA: Diagnosis not present

## 2022-11-22 DIAGNOSIS — Z87891 Personal history of nicotine dependence: Secondary | ICD-10-CM

## 2022-11-22 HISTORY — PX: UMBILICAL HERNIA REPAIR: SHX196

## 2022-11-22 HISTORY — DX: Personal history of other diseases of the respiratory system: Z87.09

## 2022-11-22 HISTORY — DX: Obstructive sleep apnea (adult) (pediatric): G47.33

## 2022-11-22 LAB — URINALYSIS, ROUTINE W REFLEX MICROSCOPIC
Bilirubin Urine: NEGATIVE
Glucose, UA: NEGATIVE mg/dL
Hgb urine dipstick: NEGATIVE
Ketones, ur: NEGATIVE mg/dL
Leukocytes,Ua: NEGATIVE
Nitrite: NEGATIVE
Protein, ur: 30 mg/dL — AB
Specific Gravity, Urine: >1.046 — ABNORMAL HIGH (ref 1.005–1.030)
pH: 6 (ref 5.0–8.0)

## 2022-11-22 LAB — I-STAT CHEM 8, ED
BUN: 21 mg/dL — ABNORMAL HIGH (ref 6–20)
Calcium, Ion: 1.18 mmol/L (ref 1.15–1.40)
Chloride: 101 mmol/L (ref 98–111)
Creatinine, Ser: 1.1 mg/dL (ref 0.61–1.24)
Glucose, Bld: 139 mg/dL — ABNORMAL HIGH (ref 70–99)
HCT: 48 % (ref 39.0–52.0)
Hemoglobin: 16.3 g/dL (ref 13.0–17.0)
Potassium: 3.9 mmol/L (ref 3.5–5.1)
Sodium: 141 mmol/L (ref 135–145)
TCO2: 28 mmol/L (ref 22–32)

## 2022-11-22 LAB — CBC WITH DIFFERENTIAL/PLATELET
Abs Immature Granulocytes: 0.08 10*3/uL — ABNORMAL HIGH (ref 0.00–0.07)
Basophils Absolute: 0 10*3/uL (ref 0.0–0.1)
Basophils Relative: 0 %
Eosinophils Absolute: 0 10*3/uL (ref 0.0–0.5)
Eosinophils Relative: 0 %
HCT: 46 % (ref 39.0–52.0)
Hemoglobin: 14.9 g/dL (ref 13.0–17.0)
Immature Granulocytes: 1 %
Lymphocytes Relative: 9 %
Lymphs Abs: 1.4 10*3/uL (ref 0.7–4.0)
MCH: 28.7 pg (ref 26.0–34.0)
MCHC: 32.4 g/dL (ref 30.0–36.0)
MCV: 88.6 fL (ref 80.0–100.0)
Monocytes Absolute: 0.9 10*3/uL (ref 0.1–1.0)
Monocytes Relative: 6 %
Neutro Abs: 12.2 10*3/uL — ABNORMAL HIGH (ref 1.7–7.7)
Neutrophils Relative %: 84 %
Platelets: 323 10*3/uL (ref 150–400)
RBC: 5.19 MIL/uL (ref 4.22–5.81)
RDW: 13.1 % (ref 11.5–15.5)
WBC: 14.5 10*3/uL — ABNORMAL HIGH (ref 4.0–10.5)
nRBC: 0 % (ref 0.0–0.2)

## 2022-11-22 LAB — COMPREHENSIVE METABOLIC PANEL
ALT: 25 U/L (ref 0–44)
AST: 24 U/L (ref 15–41)
Albumin: 4.5 g/dL (ref 3.5–5.0)
Alkaline Phosphatase: 89 U/L (ref 38–126)
Anion gap: 6 (ref 5–15)
BUN: 21 mg/dL — ABNORMAL HIGH (ref 6–20)
CO2: 28 mmol/L (ref 22–32)
Calcium: 9.4 mg/dL (ref 8.9–10.3)
Chloride: 105 mmol/L (ref 98–111)
Creatinine, Ser: 1.03 mg/dL (ref 0.61–1.24)
GFR, Estimated: >60 mL/min (ref >60)
Glucose, Bld: 139 mg/dL — ABNORMAL HIGH (ref 70–99)
Potassium: 3.7 mmol/L (ref 3.5–5.1)
Sodium: 139 mmol/L (ref 135–145)
Total Bilirubin: 0.7 mg/dL (ref 0.3–1.2)
Total Protein: 8 g/dL (ref 6.5–8.1)

## 2022-11-22 LAB — GLUCOSE, CAPILLARY: Glucose-Capillary: 155 mg/dL — ABNORMAL HIGH (ref 70–99)

## 2022-11-22 LAB — LIPASE, BLOOD: Lipase: 45 U/L (ref 11–51)

## 2022-11-22 LAB — MRSA NEXT GEN BY PCR, NASAL: MRSA by PCR Next Gen: NOT DETECTED

## 2022-11-22 SURGERY — REPAIR, HERNIA, UMBILICAL, ADULT
Anesthesia: General

## 2022-11-22 MED ORDER — PHENYLEPHRINE 80 MCG/ML (10ML) SYRINGE FOR IV PUSH (FOR BLOOD PRESSURE SUPPORT)
PREFILLED_SYRINGE | INTRAVENOUS | Status: AC
  Filled 2022-11-22: qty 10

## 2022-11-22 MED ORDER — IOHEXOL 300 MG/ML  SOLN
100.0000 mL | Freq: Once | INTRAMUSCULAR | Status: AC | PRN
  Administered 2022-11-22: 100 mL via INTRAVENOUS

## 2022-11-22 MED ORDER — FENTANYL CITRATE (PF) 250 MCG/5ML IJ SOLN
INTRAMUSCULAR | Status: DC | PRN
  Administered 2022-11-22 (×4): 50 ug via INTRAVENOUS
  Administered 2022-11-22: 100 ug via INTRAVENOUS

## 2022-11-22 MED ORDER — FENTANYL CITRATE PF 50 MCG/ML IJ SOSY
50.0000 ug | PREFILLED_SYRINGE | Freq: Once | INTRAMUSCULAR | Status: AC
  Administered 2022-11-22: 50 ug via INTRAVENOUS
  Filled 2022-11-22: qty 1

## 2022-11-22 MED ORDER — DEXAMETHASONE SODIUM PHOSPHATE 10 MG/ML IJ SOLN
INTRAMUSCULAR | Status: AC
  Filled 2022-11-22: qty 1

## 2022-11-22 MED ORDER — BISACODYL 10 MG RE SUPP
10.0000 mg | Freq: Every day | RECTAL | Status: DC
  Administered 2022-11-22 – 2022-11-29 (×8): 10 mg via RECTAL
  Filled 2022-11-22 (×10): qty 1

## 2022-11-22 MED ORDER — SUCCINYLCHOLINE CHLORIDE 200 MG/10ML IV SOSY
PREFILLED_SYRINGE | INTRAVENOUS | Status: AC
  Filled 2022-11-22: qty 10

## 2022-11-22 MED ORDER — HYDROMORPHONE HCL 1 MG/ML IJ SOLN: 0.2500 mg | INTRAMUSCULAR | Status: DC | PRN

## 2022-11-22 MED ORDER — CEFAZOLIN SODIUM-DEXTROSE 2-4 GM/100ML-% IV SOLN: 2.0000 g | Freq: Three times a day (TID) | INTRAVENOUS | Status: DC

## 2022-11-22 MED ORDER — FENTANYL CITRATE (PF) 250 MCG/5ML IJ SOLN
INTRAMUSCULAR | Status: AC
  Filled 2022-11-22: qty 5

## 2022-11-22 MED ORDER — HYDROMORPHONE HCL 1 MG/ML IJ SOLN
1.0000 mg | Freq: Once | INTRAMUSCULAR | Status: AC
  Administered 2022-11-22: 1 mg via INTRAVENOUS
  Filled 2022-11-22: qty 1

## 2022-11-22 MED ORDER — ONDANSETRON HCL 4 MG/2ML IJ SOLN: 4.0000 mg | Freq: Once | INTRAMUSCULAR | Status: DC | PRN

## 2022-11-22 MED ORDER — FENTANYL CITRATE (PF) 100 MCG/2ML IJ SOLN
INTRAMUSCULAR | Status: AC
  Filled 2022-11-22: qty 2

## 2022-11-22 MED ORDER — LORATADINE 10 MG PO TABS
10.0000 mg | ORAL_TABLET | Freq: Every evening | ORAL | Status: DC
  Administered 2022-11-22 – 2022-11-25 (×3): 10 mg via ORAL
  Filled 2022-11-22 (×4): qty 1

## 2022-11-22 MED ORDER — ROCURONIUM BROMIDE 10 MG/ML (PF) SYRINGE
PREFILLED_SYRINGE | INTRAVENOUS | Status: DC | PRN
  Administered 2022-11-22: 70 mg via INTRAVENOUS
  Administered 2022-11-22: 20 mg via INTRAVENOUS

## 2022-11-22 MED ORDER — LOSARTAN POTASSIUM 50 MG PO TABS
100.0000 mg | ORAL_TABLET | Freq: Every day | ORAL | Status: DC
  Administered 2022-11-23: 100 mg via ORAL
  Filled 2022-11-22 (×2): qty 2

## 2022-11-22 MED ORDER — SUGAMMADEX SODIUM 500 MG/5ML IV SOLN
INTRAVENOUS | Status: AC
  Filled 2022-11-22: qty 5

## 2022-11-22 MED ORDER — CEFAZOLIN IN SODIUM CHLORIDE 3-0.9 GM/100ML-% IV SOLN
INTRAVENOUS | Status: AC
  Filled 2022-11-22: qty 100

## 2022-11-22 MED ORDER — ATORVASTATIN CALCIUM 10 MG PO TABS
10.0000 mg | ORAL_TABLET | Freq: Every day | ORAL | Status: DC
  Administered 2022-11-23 – 2022-11-25 (×3): 10 mg via ORAL
  Filled 2022-11-22 (×4): qty 1

## 2022-11-22 MED ORDER — PANTOPRAZOLE SODIUM 40 MG IV SOLR
40.0000 mg | INTRAVENOUS | Status: DC
  Administered 2022-11-22 – 2022-11-24 (×3): 40 mg via INTRAVENOUS
  Filled 2022-11-22 (×3): qty 10

## 2022-11-22 MED ORDER — IPRATROPIUM-ALBUTEROL 0.5-2.5 (3) MG/3ML IN SOLN
3.0000 mL | Freq: Four times a day (QID) | RESPIRATORY_TRACT | Status: DC
  Administered 2022-11-22 – 2022-11-23 (×5): 3 mL via RESPIRATORY_TRACT
  Filled 2022-11-22 (×5): qty 3

## 2022-11-22 MED ORDER — MIDAZOLAM HCL 2 MG/2ML IJ SOLN
INTRAMUSCULAR | Status: AC
  Filled 2022-11-22: qty 2

## 2022-11-22 MED ORDER — ENOXAPARIN SODIUM 60 MG/0.6ML IJ SOSY
60.0000 mg | PREFILLED_SYRINGE | INTRAMUSCULAR | Status: DC
  Administered 2022-11-23 – 2022-11-24 (×2): 60 mg via SUBCUTANEOUS
  Filled 2022-11-22 (×2): qty 0.6

## 2022-11-22 MED ORDER — MEPERIDINE HCL 50 MG/ML IJ SOLN: 6.2500 mg | INTRAMUSCULAR | Status: DC | PRN

## 2022-11-22 MED ORDER — ONDANSETRON HCL 4 MG/2ML IJ SOLN
4.0000 mg | Freq: Once | INTRAMUSCULAR | Status: AC
  Administered 2022-11-22: 4 mg via INTRAVENOUS
  Filled 2022-11-22: qty 2

## 2022-11-22 MED ORDER — MONTELUKAST SODIUM 10 MG PO TABS
10.0000 mg | ORAL_TABLET | Freq: Every day | ORAL | Status: DC
  Administered 2022-11-22 – 2022-11-25 (×4): 10 mg via ORAL
  Filled 2022-11-22 (×4): qty 1

## 2022-11-22 MED ORDER — CHLORHEXIDINE GLUCONATE CLOTH 2 % EX PADS
6.0000 | MEDICATED_PAD | Freq: Every day | CUTANEOUS | Status: DC
  Administered 2022-11-22 – 2022-12-07 (×18): 6 via TOPICAL

## 2022-11-22 MED ORDER — HYDRALAZINE HCL 20 MG/ML IJ SOLN
5.0000 mg | INTRAMUSCULAR | Status: DC | PRN
  Filled 2022-11-22: qty 1

## 2022-11-22 MED ORDER — BUPIVACAINE LIPOSOME 1.3 % IJ SUSP
INTRAMUSCULAR | Status: AC
  Filled 2022-11-22: qty 20

## 2022-11-22 MED ORDER — MORPHINE SULFATE (PF) 2 MG/ML IV SOLN
2.0000 mg | INTRAVENOUS | Status: DC | PRN
  Administered 2022-11-26 – 2022-11-29 (×4): 2 mg via INTRAVENOUS
  Filled 2022-11-22 (×4): qty 1

## 2022-11-22 MED ORDER — LACTATED RINGERS IV SOLN: INTRAVENOUS | Status: DC | PRN

## 2022-11-22 MED ORDER — SENNOSIDES-DOCUSATE SODIUM 8.6-50 MG PO TABS
2.0000 | ORAL_TABLET | Freq: Two times a day (BID) | ORAL | Status: DC
  Administered 2022-11-22 – 2022-11-25 (×7): 2 via ORAL
  Filled 2022-11-22 (×8): qty 2

## 2022-11-22 MED ORDER — LEVOCETIRIZINE DIHYDROCHLORIDE 5 MG PO TABS: 5.0000 mg | ORAL_TABLET | Freq: Every evening | ORAL | Status: DC

## 2022-11-22 MED ORDER — PROPOFOL 10 MG/ML IV BOLUS
INTRAVENOUS | Status: DC | PRN
  Administered 2022-11-22: 200 mg via INTRAVENOUS

## 2022-11-22 MED ORDER — ACETAMINOPHEN 500 MG PO TABS
1000.0000 mg | ORAL_TABLET | Freq: Four times a day (QID) | ORAL | Status: DC
  Administered 2022-11-22 – 2022-11-25 (×11): 1000 mg via ORAL
  Filled 2022-11-22 (×16): qty 2

## 2022-11-22 MED ORDER — ONDANSETRON HCL 4 MG/2ML IJ SOLN
INTRAMUSCULAR | Status: AC
  Filled 2022-11-22: qty 2

## 2022-11-22 MED ORDER — LACTATED RINGERS IV SOLN: INTRAVENOUS | Status: AC

## 2022-11-22 MED ORDER — DEXAMETHASONE SODIUM PHOSPHATE 10 MG/ML IJ SOLN
INTRAMUSCULAR | Status: DC | PRN
  Administered 2022-11-22: 10 mg via INTRAVENOUS

## 2022-11-22 MED ORDER — SPIRONOLACTONE 25 MG PO TABS
25.0000 mg | ORAL_TABLET | Freq: Every day | ORAL | Status: DC
  Administered 2022-11-23 – 2022-11-24 (×2): 25 mg via ORAL
  Filled 2022-11-22 (×2): qty 1

## 2022-11-22 MED ORDER — LOSARTAN POTASSIUM-HCTZ 100-25 MG PO TABS: 1.0000 | ORAL_TABLET | Freq: Every day | ORAL | Status: DC

## 2022-11-22 MED ORDER — LIDOCAINE HCL (PF) 2 % IJ SOLN
INTRAMUSCULAR | Status: AC
  Filled 2022-11-22: qty 5

## 2022-11-22 MED ORDER — ESMOLOL HCL 100 MG/10ML IV SOLN
INTRAVENOUS | Status: AC
  Filled 2022-11-22: qty 10

## 2022-11-22 MED ORDER — SUCCINYLCHOLINE CHLORIDE 200 MG/10ML IV SOSY
PREFILLED_SYRINGE | INTRAVENOUS | Status: DC | PRN
  Administered 2022-11-22: 200 mg via INTRAVENOUS

## 2022-11-22 MED ORDER — ONDANSETRON HCL 4 MG/2ML IJ SOLN
INTRAMUSCULAR | Status: DC | PRN
  Administered 2022-11-22: 4 mg via INTRAVENOUS

## 2022-11-22 MED ORDER — SUGAMMADEX SODIUM 500 MG/5ML IV SOLN
INTRAVENOUS | Status: DC | PRN
  Administered 2022-11-22: 300 mg via INTRAVENOUS

## 2022-11-22 MED ORDER — PHENYLEPHRINE 80 MCG/ML (10ML) SYRINGE FOR IV PUSH (FOR BLOOD PRESSURE SUPPORT)
PREFILLED_SYRINGE | INTRAVENOUS | Status: DC | PRN
  Administered 2022-11-22 (×4): 80 ug via INTRAVENOUS
  Administered 2022-11-22: 160 ug via INTRAVENOUS
  Administered 2022-11-22 (×5): 80 ug via INTRAVENOUS
  Administered 2022-11-22: 160 ug via INTRAVENOUS
  Administered 2022-11-22 (×5): 80 ug via INTRAVENOUS

## 2022-11-22 MED ORDER — SODIUM CHLORIDE 0.9 % IR SOLN
Status: DC | PRN
  Administered 2022-11-22: 1000 mL

## 2022-11-22 MED ORDER — LIDOCAINE HCL (CARDIAC) PF 100 MG/5ML IV SOSY
PREFILLED_SYRINGE | INTRAVENOUS | Status: DC | PRN
  Administered 2022-11-22: 100 mg via INTRATRACHEAL

## 2022-11-22 MED ORDER — BUPIVACAINE LIPOSOME 1.3 % IJ SUSP
INTRAMUSCULAR | Status: DC | PRN
  Administered 2022-11-22: 20 mL

## 2022-11-22 MED ORDER — ALBUTEROL SULFATE (2.5 MG/3ML) 0.083% IN NEBU: 2.5000 mg | INHALATION_SOLUTION | RESPIRATORY_TRACT | Status: DC | PRN

## 2022-11-22 MED ORDER — SODIUM CHLORIDE 0.9 % IV BOLUS
1000.0000 mL | Freq: Once | INTRAVENOUS | Status: AC
  Administered 2022-11-22: 1000 mL via INTRAVENOUS

## 2022-11-22 MED ORDER — FENTANYL CITRATE (PF) 100 MCG/2ML IJ SOLN
100.0000 ug | Freq: Once | INTRAMUSCULAR | Status: AC
  Administered 2022-11-22: 100 ug via INTRAVENOUS
  Filled 2022-11-22: qty 2

## 2022-11-22 MED ORDER — PROPOFOL 10 MG/ML IV BOLUS
INTRAVENOUS | Status: AC
  Filled 2022-11-22: qty 20

## 2022-11-22 MED ORDER — ROCURONIUM BROMIDE 10 MG/ML (PF) SYRINGE
PREFILLED_SYRINGE | INTRAVENOUS | Status: AC
  Filled 2022-11-22: qty 10

## 2022-11-22 MED ORDER — ESMOLOL HCL 100 MG/10ML IV SOLN
INTRAVENOUS | Status: DC | PRN
  Administered 2022-11-22: 30 mg via INTRAVENOUS

## 2022-11-22 MED ORDER — OXYCODONE HCL 5 MG PO TABS: 5.0000 mg | ORAL_TABLET | ORAL | Status: DC | PRN

## 2022-11-22 MED ORDER — HYDROCHLOROTHIAZIDE 25 MG PO TABS
25.0000 mg | ORAL_TABLET | Freq: Every day | ORAL | Status: DC
  Administered 2022-11-23: 25 mg via ORAL
  Filled 2022-11-22 (×2): qty 1

## 2022-11-22 MED ORDER — CEFAZOLIN IN SODIUM CHLORIDE 3-0.9 GM/100ML-% IV SOLN
3.0000 g | Freq: Once | INTRAVENOUS | Status: AC
  Administered 2022-11-22: 3 g via INTRAVENOUS
  Filled 2022-11-22: qty 100

## 2022-11-22 SURGICAL SUPPLY — 39 items
BINDER ABDOMINAL 12 XL 75-84 (SOFTGOODS) IMPLANT
BLADE SURG 15 STRL LF DISP TIS (BLADE) ×1 IMPLANT
BLADE SURG 15 STRL SS (BLADE) ×1
CHLORAPREP W/TINT 26 (MISCELLANEOUS) ×1 IMPLANT
CLOTH BEACON ORANGE TIMEOUT ST (SAFETY) ×1 IMPLANT
COVER LIGHT HANDLE STERIS (MISCELLANEOUS) ×2 IMPLANT
DERMABOND ADVANCED .7 DNX12 (GAUZE/BANDAGES/DRESSINGS) ×1 IMPLANT
DERMABOND ADVANCED .7 DNX6 (GAUZE/BANDAGES/DRESSINGS) IMPLANT
DRSG TEGADERM 4X10 (GAUZE/BANDAGES/DRESSINGS) IMPLANT
ELECT REM PT RETURN 9FT ADLT (ELECTROSURGICAL) ×1
ELECTRODE REM PT RTRN 9FT ADLT (ELECTROSURGICAL) ×1 IMPLANT
GAUZE 4X4 16PLY ~~LOC~~+RFID DBL (SPONGE) ×1 IMPLANT
GAUZE SPONGE 4X4 12PLY STRL (GAUZE/BANDAGES/DRESSINGS) IMPLANT
GLOVE BIOGEL PI IND STRL 6.5 (GLOVE) ×1 IMPLANT
GLOVE BIOGEL PI IND STRL 7.0 (GLOVE) ×2 IMPLANT
GLOVE SURG SS PI 6.5 STRL IVOR (GLOVE) ×2 IMPLANT
GOWN STRL REUS W/TWL LRG LVL3 (GOWN DISPOSABLE) ×2 IMPLANT
INST SET MINOR GENERAL (KITS) ×1 IMPLANT
KIT TURNOVER KIT A (KITS) ×1 IMPLANT
MANIFOLD NEPTUNE II (INSTRUMENTS) ×1 IMPLANT
MESH VENTRALIGHT ST 4.5IN (Mesh General) IMPLANT
NDL HYPO 18GX1.5 BLUNT FILL (NEEDLE) ×1 IMPLANT
NDL HYPO 21X1.5 SAFETY (NEEDLE) ×1 IMPLANT
NDL HYPO 25X1 1.5 SAFETY (NEEDLE) ×1 IMPLANT
NEEDLE HYPO 18GX1.5 BLUNT FILL (NEEDLE) ×1 IMPLANT
NEEDLE HYPO 21X1.5 SAFETY (NEEDLE) ×1 IMPLANT
NEEDLE HYPO 25X1 1.5 SAFETY (NEEDLE) ×1 IMPLANT
NS IRRIG 1000ML POUR BTL (IV SOLUTION) ×1 IMPLANT
PACK MINOR (CUSTOM PROCEDURE TRAY) ×1 IMPLANT
PAD ARMBOARD 7.5X6 YLW CONV (MISCELLANEOUS) ×1 IMPLANT
PENCIL SMOKE EVACUATOR (MISCELLANEOUS) ×1 IMPLANT
SET BASIN LINEN APH (SET/KITS/TRAYS/PACK) ×1 IMPLANT
SPONGE TONSIL 1 RF SGL (DISPOSABLE) ×1 IMPLANT
SUT ETHIBOND 0 MO6 C/R (SUTURE) ×1 IMPLANT
SUT MNCRL AB 4-0 PS2 18 (SUTURE) ×1 IMPLANT
SUT VIC AB 2-0 CT2 27 (SUTURE) IMPLANT
SUT VIC AB 3-0 SH 27 (SUTURE) ×1
SUT VIC AB 3-0 SH 27X BRD (SUTURE) ×1 IMPLANT
SYR 20ML LL LF (SYRINGE) ×2 IMPLANT

## 2022-11-22 NOTE — Progress Notes (Signed)
[facility]  Spoke with the patient's mother in the waiting room.  I explained that he tolerated the procedure without difficulty.  I was able to repair the umbilical hernia and insert a mesh, as he did not require any bowel resection.  He has dissolvable stitches under the skin with overlying skin glue.  This will flake off in 10 to 14 days.  We will give him a clear liquid diet and see how he tolerates.  If he begins to have nausea or vomiting, we may need to place an NG tube.  All questions were answered to her expressed satisfaction.  Plan: -Clear liquid diet -IV fluids per hospitalist -Scheduled Tylenol and as needed Roxicodone and morphine -Bowel regimen ordered with Senokot S and Dulcolax suppositories -Monitor for return of bowel function -If patient begins to have nausea or vomiting, may need to place NG tube -Appreciate hospitalist recommendations  Theophilus Kinds, DO [facility] [address] [place] [place], [place] [address] [phone] (office)

## 2022-11-22 NOTE — ED Triage Notes (Signed)
Patient c/o stomach pain 9/10 since yesterday after eating chinese food. No vomiting just dry heaving, LBM was yesterday.

## 2022-11-22 NOTE — Transfer of Care (Signed)
Immediate Anesthesia Transfer of Care Note  Patient: Kyle Burns  Procedure(s) Performed: HERNIA REPAIR UMBILICAL ADULT,  WITH MESH  Patient Location: PACU  Anesthesia Type:General  Level of Consciousness: awake, alert , oriented, and sedated  Airway & Oxygen Therapy: Patient Spontanous Breathing and non-rebreather face mask  Post-op Assessment: Report given to RN and Post -op Vital signs reviewed and stable  Post vital signs: Reviewed and unstable  Last Vitals:  Vitals Value Taken Time  BP 116/75 11/22/22 1515  Temp 98.1 11/21/22    1515  Pulse 92 11/22/22 1530  Resp 15 11/22/22 1530  SpO2 91 % 11/22/22 1530  Vitals shown include unvalidated device data.  Last Pain:  Vitals:   11/22/22 1037  TempSrc: Oral  PainSc:       Patients Stated Pain Goal: 0 (11/22/22 0554)  Complications:  Encounter Notable Events  Notable Event Outcome Phase Comment  Difficult to intubate - expected  Intraprocedure Filed from anesthesia note documentation.

## 2022-11-22 NOTE — ED Notes (Signed)
Per Dr. Nelda Marseille instructions, ice pack applied to abdomen and patient placed in trendelenburg position.

## 2022-11-22 NOTE — ED Provider Notes (Signed)
Pt signed out by Dr. Bebe Shaggy pending additional pain meds, trendelenburg, and additional attempt to reduce hernia.  I am unable to reduce hernia.  Pt given additional pain meds.  He is d/w Dr. Robyne Peers who will see pt.  She did see pt in the ED.  She was able to reduce the hernia, but it comes right back out and causes pain.  So, she is going to take pt to the OR.  She requests hospitalist admission.  Pt d/w Dr. Randol Kern (triad) for admission.   Jacalyn Lefevre, MD 11/22/22 1024

## 2022-11-22 NOTE — Anesthesia Procedure Notes (Signed)
Procedure Name: Intubation Date/Time: 11/22/2022 12:27 PM  Performed by: Molli Barrows, MDPre-anesthesia Checklist: Patient identified, Emergency Drugs available, Suction available and Patient being monitored Patient Re-evaluated:Patient Re-evaluated prior to induction Oxygen Delivery Method: Circle system utilized Preoxygenation: Pre-oxygenation with 100% oxygen Induction Type: IV induction, Cricoid Pressure applied and Rapid sequence Laryngoscope Size: Glidescope and 4 Grade View: Grade II Tube type: Oral Tube size: 7.5 mm Number of attempts: 1 Airway Equipment and Method: Stylet Placement Confirmation: ETT inserted through vocal cords under direct vision, positive ETCO2 and breath sounds checked- equal and bilateral Secured at: 25 (AT LIP) cm Tube secured with: Tape Dental Injury: Teeth and Oropharynx as per pre-operative assessment  Difficulty Due To: Difficulty was anticipated

## 2022-11-22 NOTE — Anesthesia Postprocedure Evaluation (Signed)
Anesthesia Post Note  Patient: Kyle Burns  Procedure(s) Performed: HERNIA REPAIR UMBILICAL ADULT,  WITH MESH  Patient location during evaluation: ICU Anesthesia Type: General Level of consciousness: awake and alert and oriented Pain management: pain level controlled Vital Signs Assessment: post-procedure vital signs reviewed and stable Respiratory status: spontaneous breathing, nonlabored ventilation and respiratory function unstable (on BIPAP) Cardiovascular status: blood pressure returned to baseline and stable Postop Assessment: no apparent nausea or vomiting Anesthetic complications: yes Comments: Patient desaturated in PACU on oxygen mask and non rebreather mask, called respiratory therapist and put him on BIPAP, Saturations improved and in upper 90s and 100 % on 100% FIO2. Discussed with Dr Wallace Going and Dr Randol Kern.   Encounter Notable Events  Notable Event Outcome Phase Comment  Difficult to intubate - expected  Intraprocedure Filed from anesthesia note documentation.     Last Vitals:  Vitals:   11/22/22 1037 11/22/22 1509  BP:  130/63  Pulse:  100  Resp:  18  Temp: 36.7 C 36.7 C  SpO2:  (!) 80%    Last Pain:  Vitals:   11/22/22 1509  TempSrc:   PainSc: 0-No pain                 Pamala Hayman C Kimimila Tauzin

## 2022-11-22 NOTE — H&P (Signed)
TRH H&P   Patient Demographics:    Kyle Burns, is a 56 y.o. male  MRN: [id number]   DOB - 1966/06/08  Admit Date - 11/22/2022  Outpatient Primary MD for the patient is Janora Norlander, DO  Referring MD/NP/PA: Dr Gilford Raid  Patient coming from: home  Chief Complaint  Patient presents with   Abdominal Pain    C/o stomach pain and nausea after eating chinese food yesterday.      HPI:    [name]  is a 56 y.o. male, with past medical history of hypertension, asthma, GERD, hyperlipidemia, O SA/OHS, BiPAP dependent with oxygen at nighttime. -Patient presents to ED secondary to complaints of abdominal pain, developed yesterday, started having generalized abdominal pain, nausea, after eating [place] food, he denies any vomiting, he does report dry heaving as well, he denies any fever, chills, no dysuria or polyuria, he denies any obstipation or constipation, no diarrhea, no bright red blood per rectum, no chest pain, orts pain has worsened overnight which prompted him to come to ED -In ED patient was noted to have tender umbilical hernia, CT abdomen pelvis confirms incarcerated umbilical hernia, it was seen by general surgery with plan for surgical repair today, [facility] hospitalist consulted to admit.    Review of systems:   A full 10 point Review of Systems was done, except as stated above, all other Review of Systems were negative.   With Past History of the following :    Past Medical History:  Diagnosis Date   Asthma    hx of    Back pain    Bilevel positive airway pressure (BPAP) dependence    GERD (gastroesophageal reflux disease)    HTN (hypertension)    Hyperlipidemia       Past Surgical History:  Procedure Laterality Date   bilateral hernia repair     total of 3    CYST REMOVAL NECK     age - 50s      Social History:     Social History    Tobacco Use   Smoking status: Never   Smokeless tobacco: Former    Types: Chew    Quit date: 10/2019   Tobacco comments:    Uses chew "when he has a bad day"  Substance Use Topics   Alcohol use: No    Comment: Socially       Family History :     Family History  Problem Relation Age of Onset   Asthma Father    Cancer Father        stomach   Arthritis Father    Asthma Maternal Grandfather    Diabetes Maternal Grandfather    Stroke Maternal Grandfather    Asthma Paternal Grandfather    Crohn's disease Mother    Arthritis Mother        back pain    Diabetes Mother  Hypertension Sister    Multiple sclerosis Brother    Hypertension Maternal Grandmother       Home Medications:   Prior to Admission medications   Medication Sig Start Date End Date Taking? Authorizing Provider  albuterol (VENTOLIN HFA) 108 (90 Base) MCG/ACT inhaler USE 2 PUFFS EVERY 6 HOURS AS NEEDED FOR WHEEZING (1 for home, 1 for work) 09/17/22   Daryll Drown, NP  aspirin 81 MG EC tablet Take 162 mg by mouth daily.     [provider]  atorvastatin (LIPITOR) 10 MG tablet Take 1 tablet (10 mg total) by mouth daily. 11/04/21   Raliegh Ip, DO  benzonatate (TESSALON) 200 MG capsule Take 1 capsule (200 mg total) by mouth 3 (three) times daily as needed for cough. 11/14/22   [name], MD  brompheniramine-pseudoephedrine-DM 30-2-10 MG/5ML syrup Take 5 mLs by mouth 4 (four) times daily as needed. 09/12/22   Daryll Drown, NP  budesonide-formoterol (SYMBICORT) 80-4.5 MCG/ACT inhaler Inhale 2 puffs into the lungs 2 (two) times daily. Patient not taking: Reported on 11/14/2022 11/04/21   Raliegh Ip, DO  esomeprazole (NEXIUM) 40 MG capsule Take 1 capsule (40 mg total) by mouth daily. 11/04/21   Raliegh Ip, DO  famotidine (PEPCID) 20 MG tablet One after supper 09/24/22   [name], MD  FLUoxetine (PROZAC) 20 MG capsule TAKE ONE (1) CAPSULE EACH DAY 10/06/22    Gottschalk, Ashly M, DO  fluticasone (FLONASE) 50 MCG/ACT nasal spray Place 2 sprays into both nostrils daily. 11/04/21   Raliegh Ip, DO  furosemide (LASIX) 20 MG tablet TAKE ONE (1) TABLET EACH DAY 11/04/21   Delynn Flavin M, DO  guaiFENesin (MUCINEX) 600 MG 12 hr tablet Take 1 tablet (600 mg total) by mouth 2 (two) times daily. 09/17/22   Daryll Drown, NP  ibuprofen (ADVIL) 600 MG tablet TAKE 1 TABLET BY MOUTH EVERY 8 HOURS AS NEEDED FOR PAIN 06/23/22   Delynn Flavin M, DO  levocetirizine (XYZAL) 5 MG tablet Take 1 tablet (5 mg total) by mouth every evening. To replace Zyrtec 10/06/22   Delynn Flavin M, DO  losartan-hydrochlorothiazide (HYZAAR) 100-25 MG tablet Take 1 tablet by mouth daily. 11/04/21   Raliegh Ip, DO  montelukast (SINGULAIR) 10 MG tablet Take 1 tablet (10 mg total) by mouth at bedtime. 09/12/22   Rakes, Doralee Albino, FNP  OVER THE COUNTER MEDICATION VIT C , VIT D, Zinc, MVI 50 plus - all one a day    [provider]  Spacer/Aero-Holding Chambers (AEROCHAMBER MV) inhaler Use as instructed 05/15/15   Lupita Leash, MD  spironolactone (ALDACTONE) 25 MG tablet TAKE ONE (1) TABLET EACH DAY 11/04/21   Delynn Flavin M, DO     Allergies:    No Known Allergies   Physical Exam:   Vitals  Blood pressure (!) 112/98, pulse 78, temperature 98 F (36.7 C), temperature source Oral, resp. rate (!) 24, height 5\' 11"  (1.803 m), weight 131.5 kg, SpO2 97 %.   1. General developed male, laying in bed, no apparent distress  2. Normal affect and insight, Not Suicidal or Homicidal, Awake Alert, Oriented X 3.  3. No F.N deficits, ALL C.Nerves Intact, Strength 5/5 all 4 extremities, Sensation intact all 4 extremities, Plantars down going.  4. Ears and Eyes appear Normal, Conjunctivae clear, PERRLA. Moist Oral Mucosa.  5. Supple Neck, No JVD, No cervical lymphadenopathy appriciated, No Carotid Bruits.  6. Symmetrical Chest wall movement, Good  air  movement bilaterally, diminished air entry at left lung base  7. RRR, No Gallops, Rubs or Murmurs, No Parasternal Heave.  8. Positive Bowel Sounds, umbilical hernia noted, with tenderness to palpation 9.  No Cyanosis, Normal Skin Turgor, No Skin Rash or Bruise.  10. Good muscle tone,  joints appear normal , no effusions, Normal ROM.   Data Review:    CBC Recent Labs  Lab 11/22/22 0607 11/22/22 0635  WBC 14.5*  --   HGB 14.9 16.3  HCT 46.0 48.0  PLT 323  --   MCV 88.6  --   MCH 28.7  --   MCHC 32.4  --   RDW 13.1  --   LYMPHSABS 1.4  --   MONOABS 0.9  --   EOSABS 0.0  --   BASOSABS 0.0  --    ------------------------------------------------------------------------------------------------------------------  Chemistries  Recent Labs  Lab 11/22/22 0607 11/22/22 0635  NA 139 141  K 3.7 3.9  CL 105 101  CO2 28  --   GLUCOSE 139* 139*  BUN 21* 21*  CREATININE 1.03 1.10  CALCIUM 9.4  --   AST 24  --   ALT 25  --   ALKPHOS 89  --   BILITOT 0.7  --    ------------------------------------------------------------------------------------------------------------------ estimated creatinine clearance is 101.3 mL/min (by C-G formula based on SCr of 1.1 mg/dL). ------------------------------------------------------------------------------------------------------------------ No results for input(s): "TSH", "T4TOTAL", "T3FREE", "THYROIDAB" in the last 72 hours.  Invalid input(s): "FREET3"  Coagulation profile No results for input(s): "INR", "PROTIME" in the last 168 hours. ------------------------------------------------------------------------------------------------------------------- No results for input(s): "DDIMER" in the last 72 hours. -------------------------------------------------------------------------------------------------------------------  Cardiac Enzymes No results for input(s): "CKMB", "TROPONINI", "MYOGLOBIN" in the last 168 hours.  Invalid input(s):  "CK" ------------------------------------------------------------------------------------------------------------------ No results found for: "BNP"   ---------------------------------------------------------------------------------------------------------------  Urinalysis    Component Value Date/Time   COLORURINE YELLOW 11/22/2022 0810   APPEARANCEUR CLEAR 11/22/2022 0810   APPEARANCEUR Clear 08/29/2022 1119   LABSPEC >1.046 (H) 11/22/2022 0810   PHURINE 6.0 11/22/2022 0810   GLUCOSEU NEGATIVE 11/22/2022 0810   HGBUR NEGATIVE 11/22/2022 0810   BILIRUBINUR NEGATIVE 11/22/2022 0810   BILIRUBINUR Negative 08/29/2022 1119   KETONESUR NEGATIVE 11/22/2022 0810   PROTEINUR 30 (A) 11/22/2022 0810   NITRITE NEGATIVE 11/22/2022 0810   LEUKOCYTESUR NEGATIVE 11/22/2022 0810    ----------------------------------------------------------------------------------------------------------------   Imaging Results:    CT ABDOMEN PELVIS W CONTRAST  Result Date: 11/22/2022 CLINICAL DATA:  Abdominal pain. EXAM: CT ABDOMEN AND PELVIS WITH CONTRAST TECHNIQUE: Multidetector CT imaging of the abdomen and pelvis was performed using the standard protocol following bolus administration of intravenous contrast. RADIATION DOSE REDUCTION: This exam was performed according to the departmental dose-optimization program which includes automated exposure control, adjustment of the mA and/or kV according to patient size and/or use of iterative reconstruction technique. CONTRAST:  171mL OMNIPAQUE IOHEXOL 300 MG/ML  SOLN COMPARISON:  None Available. FINDINGS: Lower chest: 4 mm right lung nodule identified on image 5/4. Dependent atelectasis noted in the right lower lobe. Hepatobiliary: The liver shows diffusely decreased attenuation suggesting fat deposition. There is no evidence for gallstones, gallbladder wall thickening, or pericholecystic fluid. No intrahepatic or extrahepatic biliary dilation. Pancreas: No focal mass  lesion. No dilatation of the main duct. No intraparenchymal cyst. No peripancreatic edema. Spleen: No splenomegaly. No focal mass lesion. Adrenals/Urinary Tract: No adrenal nodule or mass. Kidneys unremarkable. No evidence for hydroureter. The urinary bladder appears normal for the degree of distention. Stomach/Bowel: Stomach is distended with gas and  fluid. Duodenum is normally positioned as is the ligament of Treitz. Small bowel loops in the anterior abdomen are distended up to 4.3 cm diameter. The most dilated loop extends anteriorly into an umbilical hernia with fecalization of enteric contents proximal to the herniated segment. Small bowel leading the umbilical hernia and re-entering the peritoneal cavity is completely decompressed. The terminal ileum is normal. The appendix is normal. No gross colonic mass. No colonic wall thickening. Vascular/Lymphatic: No abdominal aortic aneurysm. No abdominal aortic atherosclerotic calcification. There is no gastrohepatic or hepatoduodenal ligament lymphadenopathy. No retroperitoneal or mesenteric lymphadenopathy. No pelvic sidewall lymphadenopathy. Reproductive: The prostate gland and seminal vesicles are unremarkable. Other: No intraperitoneal free fluid. Musculoskeletal: Evidence of prior bilateral groin herniorrhaphy evident with recurrent right groin hernia containing only fat. No worrisome lytic or sclerotic osseous abnormality. IMPRESSION: 1. Small bowel obstruction secondary to an incarcerated umbilical hernia. The most dilated loop proximal to the hernia demonstrates fecalization of enteric contents compatible with decreased transit. No small bowel wall thickening or pneumatosis. No substantial perienteric edema or intraperitoneal free fluid. 2. Hepatic steatosis. 3. 4 mm right lung nodule. No follow-up needed if patient is low-risk.This recommendation follows the consensus statement: Guidelines for Management of Incidental Pulmonary Nodules Detected on CT Images:  From the Fleischner Society 2017; Radiology 2017; 284:228-243. Electronically Signed   By: Misty Stanley M.D.   On: 11/22/2022 07:17    My personal review of EKG: Vent. rate 77 BPM PR interval 153 ms QRS duration 168 ms QT/QTcB 415/470 ms P-R-T axes 71 117 30 Sinus rhythm Nonspecific intraventricular conduction delay   Assessment & Plan:    Principal Problem:   Incarcerated umbilical hernia Active Problems:   Hyperlipidemia   Obstructive sleep apnea   Gastroesophageal reflux disease   BMI 40.0-44.9, adult (HCC)   Mild intermittent asthma   Incarcerated umbilical hernia SBO -Patient presents with abdominal pain, umbilical hernia, very tender to palpation, incarcerated on imaging -General surgery input greatly appreciated, plan for surgical repair today -Keep n.p.o. -Continue with IV fluids -Hold aspirin -No nausea or vomiting, so no indication for NGT at this point  Hypertension -Blood pressure is soft upon my evaluation, likely as a just received multiple doses of fentanyl and Dilaudid -Will resume home meds to start from tomorrow, meanwhile we will keep on as needed hydralazine especially with his n.p.o. status  OSA/OHS -Continue with BiPAP at bedtime  GERD -Continue with Protonix IV  Hyperlipidemia -resume statin from tomorrow  Incidental finding of of 4 mm right lung nodule -To follow with pulmonary as an outpatient  Mild intermittent asthma -No wheezing, no respiratory distress, continue with scheduled DuoNebs and as needed albuterol, he was encouraged to use incentive spirometer especially with known left elevated hemidiaphragm.  Obesity stage III Body mass index is 40.45 kg/m.   DVT Prophylaxis Lovenox - SCDs  AM Labs Ordered, also please review Full Orders  Family Communication: Admission, patients condition and plan of care including tests being ordered have been discussed with the patient and mother at bedside who indicate understanding and agree  with the plan and Code Status.  Code Status full  Likely DC to  home  Condition GUARDED    Consults called: general surgery    Admission status: inpatient    Time spent in minutes : 65 minutes   Phillips Climes M.D on 11/22/2022 at 10:37 AM   [facility] - Office  [phone]

## 2022-11-22 NOTE — ED Provider Notes (Signed)
[facility] EMERGENCY DEPARTMENT Provider Note   CSN: [id number] Arrival date & time: 11/22/22  0539     History  Chief Complaint  Patient presents with   Abdominal Pain    C/o stomach pain and nausea after eating chinese food yesterday.    Kyle Burns is a 56 y.o. male.  The history is provided by the patient.  Abdominal Pain Associated symptoms: no dysuria and no fever   Patient with history of asthma, obesity, hypertension, hyperlipidemia presents with abdominal pain.  He reports yesterday he started having generalized abdominal pain and nausea after eating [place] food.  No vomiting, reports he is dry heaving.  He reports normal bowel movements, no diarrhea.  No fevers.  No chest pain.  He reports tonight the pain is worsening, worse with movement and worse while driving a car.   Previous surgical history includes bilateral inguinal hernia repair Past Medical History:  Diagnosis Date   Asthma    hx of    Back pain    Bilevel positive airway pressure (BPAP) dependence    GERD (gastroesophageal reflux disease)    HTN (hypertension)    Hyperlipidemia     Home Medications Prior to Admission medications   Medication Sig Start Date End Date Taking? Authorizing Provider  albuterol (VENTOLIN HFA) 108 (90 Base) MCG/ACT inhaler USE 2 PUFFS EVERY 6 HOURS AS NEEDED FOR WHEEZING (1 for home, 1 for work) 09/17/22   Daryll Drown, NP  aspirin 81 MG EC tablet Take 162 mg by mouth daily.     [provider]  atorvastatin (LIPITOR) 10 MG tablet Take 1 tablet (10 mg total) by mouth daily. 11/04/21   Raliegh Ip, DO  benzonatate (TESSALON) 200 MG capsule Take 1 capsule (200 mg total) by mouth 3 (three) times daily as needed for cough. 11/14/22   [name], MD  brompheniramine-pseudoephedrine-DM 30-2-10 MG/5ML syrup Take 5 mLs by mouth 4 (four) times daily as needed. 09/12/22   Daryll Drown, NP  budesonide-formoterol (SYMBICORT) 80-4.5 MCG/ACT inhaler Inhale  2 puffs into the lungs 2 (two) times daily. Patient not taking: Reported on 11/14/2022 11/04/21   Raliegh Ip, DO  esomeprazole (NEXIUM) 40 MG capsule Take 1 capsule (40 mg total) by mouth daily. 11/04/21   Raliegh Ip, DO  famotidine (PEPCID) 20 MG tablet One after supper 09/24/22   [name], MD  FLUoxetine (PROZAC) 20 MG capsule TAKE ONE (1) CAPSULE EACH DAY 10/06/22   Gottschalk, Ashly M, DO  fluticasone (FLONASE) 50 MCG/ACT nasal spray Place 2 sprays into both nostrils daily. 11/04/21   Raliegh Ip, DO  furosemide (LASIX) 20 MG tablet TAKE ONE (1) TABLET EACH DAY 11/04/21   Delynn Flavin M, DO  guaiFENesin (MUCINEX) 600 MG 12 hr tablet Take 1 tablet (600 mg total) by mouth 2 (two) times daily. 09/17/22   Daryll Drown, NP  ibuprofen (ADVIL) 600 MG tablet TAKE 1 TABLET BY MOUTH EVERY 8 HOURS AS NEEDED FOR PAIN 06/23/22   Delynn Flavin M, DO  levocetirizine (XYZAL) 5 MG tablet Take 1 tablet (5 mg total) by mouth every evening. To replace Zyrtec 10/06/22   Delynn Flavin M, DO  losartan-hydrochlorothiazide (HYZAAR) 100-25 MG tablet Take 1 tablet by mouth daily. 11/04/21   Raliegh Ip, DO  montelukast (SINGULAIR) 10 MG tablet Take 1 tablet (10 mg total) by mouth at bedtime. 09/12/22   [name], FNP  OVER THE COUNTER MEDICATION VIT C ,  VIT D, Zinc, MVI 50 plus - all one a day    [provider]  Spacer/Aero-Holding Chambers (AEROCHAMBER MV) inhaler Use as instructed 05/15/15   Lupita Leash, MD  spironolactone (ALDACTONE) 25 MG tablet TAKE ONE (1) TABLET EACH DAY 11/04/21   Delynn Flavin M, DO      Allergies    Patient has no known allergies.    Review of Systems   Review of Systems  Constitutional:  Negative for fever.  Gastrointestinal:  Positive for abdominal pain.  Genitourinary:  Negative for dysuria.  Musculoskeletal:  Negative for back pain.    Physical Exam Updated Vital Signs BP 129/85   Pulse 84   Temp 98  F (36.7 C) (Oral)   Resp (!) 24   Ht 1.803 m (5\' 11" )   Wt 131.5 kg   SpO2 93%   BMI 40.45 kg/m  Physical Exam CONSTITUTIONAL: Well developed/well nourished, ill-appearing HEAD: Normocephalic/atraumatic EYES: EOMI/PERRL ENMT: Mucous membranes moist NECK: supple no meningeal signs CV: S1/S2 noted, no murmurs/rubs/gallops noted LUNGS: Lungs are clear to auscultation bilaterally, no apparent distress ABDOMEN: soft, obese, distended, significant tenderness noted throughout all 4 quadrants.  Guarding is noted. Umbilical hernia noted with tenderness noted GU no obvious inguinal hernias, no scrotal tenderness NEURO: Pt is awake/alert/appropriate, moves all extremitiesx4.  No facial droop.   EXTREMITIES: pulses normal/equal, full ROM SKIN: warm, color normal PSYCH: Mildly anxious  ED Results / Procedures / Treatments   Labs (all labs ordered are listed, but only abnormal results are displayed) Labs Reviewed  COMPREHENSIVE METABOLIC PANEL - Abnormal; Notable for the following components:      Result Value   Glucose, Bld 139 (*)    BUN 21 (*)    All other components within normal limits  CBC WITH DIFFERENTIAL/PLATELET - Abnormal; Notable for the following components:   WBC 14.5 (*)    Neutro Abs 12.2 (*)    Abs Immature Granulocytes 0.08 (*)    All other components within normal limits  I-STAT CHEM 8, ED - Abnormal; Notable for the following components:   BUN 21 (*)    Glucose, Bld 139 (*)    All other components within normal limits  LIPASE, BLOOD  URINALYSIS, ROUTINE W REFLEX MICROSCOPIC    EKG EKG Interpretation  Date/Time:  Saturday November 22 2022 06:13:06 EST Ventricular Rate:  77 PR Interval:  153 QRS Duration: 168 QT Interval:  415 QTC Calculation: 470 R Axis:   117 Text Interpretation: Sinus rhythm Nonspecific intraventricular conduction delay Confirmed by [id number] (Zadie Rhine) on 11/22/2022 6:52:41 AM  Radiology CT ABDOMEN PELVIS W CONTRAST  Result  Date: 11/22/2022 CLINICAL DATA:  Abdominal pain. EXAM: CT ABDOMEN AND PELVIS WITH CONTRAST TECHNIQUE: Multidetector CT imaging of the abdomen and pelvis was performed using the standard protocol following bolus administration of intravenous contrast. RADIATION DOSE REDUCTION: This exam was performed according to the departmental dose-optimization program which includes automated exposure control, adjustment of the mA and/or kV according to patient size and/or use of iterative reconstruction technique. CONTRAST:  14/08/2022 OMNIPAQUE IOHEXOL 300 MG/ML  SOLN COMPARISON:  None Available. FINDINGS: Lower chest: 4 mm right lung nodule identified on image 5/4. Dependent atelectasis noted in the right lower lobe. Hepatobiliary: The liver shows diffusely decreased attenuation suggesting fat deposition. There is no evidence for gallstones, gallbladder wall thickening, or pericholecystic fluid. No intrahepatic or extrahepatic biliary dilation. Pancreas: No focal mass lesion. No dilatation of the main duct. No intraparenchymal cyst. No peripancreatic edema. Spleen:  No splenomegaly. No focal mass lesion. Adrenals/Urinary Tract: No adrenal nodule or mass. Kidneys unremarkable. No evidence for hydroureter. The urinary bladder appears normal for the degree of distention. Stomach/Bowel: Stomach is distended with gas and fluid. Duodenum is normally positioned as is the ligament of Treitz. Small bowel loops in the anterior abdomen are distended up to 4.3 cm diameter. The most dilated loop extends anteriorly into an umbilical hernia with fecalization of enteric contents proximal to the herniated segment. Small bowel leading the umbilical hernia and re-entering the peritoneal cavity is completely decompressed. The terminal ileum is normal. The appendix is normal. No gross colonic mass. No colonic wall thickening. Vascular/Lymphatic: No abdominal aortic aneurysm. No abdominal aortic atherosclerotic calcification. There is no gastrohepatic or  hepatoduodenal ligament lymphadenopathy. No retroperitoneal or mesenteric lymphadenopathy. No pelvic sidewall lymphadenopathy. Reproductive: The prostate gland and seminal vesicles are unremarkable. Other: No intraperitoneal free fluid. Musculoskeletal: Evidence of prior bilateral groin herniorrhaphy evident with recurrent right groin hernia containing only fat. No worrisome lytic or sclerotic osseous abnormality. IMPRESSION: 1. Small bowel obstruction secondary to an incarcerated umbilical hernia. The most dilated loop proximal to the hernia demonstrates fecalization of enteric contents compatible with decreased transit. No small bowel wall thickening or pneumatosis. No substantial perienteric edema or intraperitoneal free fluid. 2. Hepatic steatosis. 3. 4 mm right lung nodule. No follow-up needed if patient is low-risk.This recommendation follows the consensus statement: Guidelines for Management of Incidental Pulmonary Nodules Detected on CT Images: From the Fleischner Society 2017; Radiology 2017; 284:228-243. Electronically Signed   By: Kennith Center M.D.   On: 11/22/2022 07:17    Procedures Procedures    Medications Ordered in ED Medications  fentaNYL (SUBLIMAZE) injection 100 mcg (100 mcg Intravenous Given 11/22/22 0633)  ondansetron (ZOFRAN) injection 4 mg (4 mg Intravenous Given 11/22/22 0633)  sodium chloride 0.9 % bolus 1,000 mL (1,000 mLs Intravenous New Bag/Given 11/22/22 0634)  iohexol (OMNIPAQUE) 300 MG/ML solution 100 mL (100 mLs Intravenous Contrast Given 11/22/22 0652)  fentaNYL (SUBLIMAZE) injection 50 mcg (50 mcg Intravenous Given 11/22/22 0731)    ED Course/ Medical Decision Making/ A&P Clinical Course as of 11/22/22 0749  Sat Nov 22, 2022  0648 Glucose(!): 139 Mild hyperglycemia [DW]  0703 WBC(!): 14.5 Moderate leukocytosis noted [DW]  0712 Pt still with pain, will give another round of meds  [DW]  0723 Patient found to have incarcerated umbilical hernia with small bowel  obstruction.  Will treat pain, place ice pack on umbilicus and consult surgery [DW]  3299 D/w dr Robyne Peers for gen surgery.  She requests to attempt to manually reduce.  If unsuccessful she can be called back and likely operative management  [DW]  [phone] Signed out to dr Particia Nearing at shift change  [DW]    Clinical Course User Index [DW] Zadie Rhine, MD                           Medical Decision Making Amount and/or Complexity of Data Reviewed Labs: ordered. Decision-making details documented in ED Course. Radiology: ordered. ECG/medicine tests: ordered.  Risk Prescription drug management. Decision regarding hospitalization.   This patient presents to the ED for concern of abdominal pain, this involves an extensive number of treatment options, and is a complaint that carries with it a high risk of complications and morbidity.  The differential diagnosis includes but is not limited to cholecystitis, cholelithiasis, pancreatitis, gastritis, peptic ulcer disease, appendicitis, bowel obstruction, bowel perforation, diverticulitis, AAA, ischemic bowel  Comorbidities that complicate the patient evaluation: Patient's presentation is complicated by their history of hypertension and obesity  Additional history obtained: Additional history obtained from family Records reviewed  outpatient records reviewed  Lab Tests: I Ordered, and personally interpreted labs.  The pertinent results include:  leukocytosis  Imaging Studies ordered: I ordered imaging studies including CT scan abdomen pelvis   I independently visualized and interpreted imaging which showed small bowel obstruction, incarcerated hernia I agree with the radiologist interpretation  Cardiac Monitoring: The patient was maintained on a cardiac monitor.  I personally viewed and interpreted the cardiac monitor which showed an underlying rhythm of:  sinus rhythm  Medicines ordered and prescription drug management: I ordered  medication including fentanyl for pain Reevaluation of the patient after these medicines showed that the patient    improved   Consultations Obtained: I requested consultation with the consultant general surgery , and discussed  findings as well as pertinent plan - they recommend: attempt reduction  Reevaluation: After the interventions noted above, I reevaluated the patient and found that they have :stayed the same  Complexity of problems addressed: Patient's presentation is most consistent with  acute presentation with potential threat to life or bodily function  Disposition: After consideration of the diagnostic results and the patient's response to treatment,  I feel that the patent would benefit from admission   .           Final Clinical Impression(s) / ED Diagnoses Final diagnoses:  Small bowel obstruction (HCC)  Umbilical hernia with obstruction, without gangrene    Rx / DC Orders ED Discharge Orders     None         Zadie RhineWickline, Madaleine Simmon, MD 11/22/22 [phone]

## 2022-11-22 NOTE — Progress Notes (Signed)
Patient off BiPAP on 3.5lpm/

## 2022-11-22 NOTE — Anesthesia Preprocedure Evaluation (Addendum)
Anesthesia Evaluation  Patient identified by MRN, date of birth, ID band Patient awake    Reviewed: Allergy & Precautions, H&P , NPO status , Patient's Chart, lab work & pertinent test results  History of Anesthesia Complications Negative for: history of anesthetic complications  Airway Mallampati: III  TM Distance: >3 FB Neck ROM: Full    Dental  (+) Dental Advisory Given, Missing   Pulmonary shortness of breath and with exertion, asthma , sleep apnea and Continuous Positive Airway Pressure Ventilation , COPD,  COPD inhaler   Pulmonary exam normal breath sounds clear to auscultation       Cardiovascular Exercise Tolerance: Good hypertension, Pt. on medications Normal cardiovascular exam+ dysrhythmias  Rhythm:Regular Rate:Normal     Neuro/Psych negative neurological ROS  negative psych ROS   GI/Hepatic Neg liver ROS,GERD  Medicated and Controlled,,  Endo/Other    Morbid obesity  Renal/GU negative Renal ROS  negative genitourinary   Musculoskeletal  (+) Arthritis , Osteoarthritis,    Abdominal   Peds negative pediatric ROS (+)  Hematology negative hematology ROS (+)   Anesthesia Other Findings   Reproductive/Obstetrics negative OB ROS                             Anesthesia Physical Anesthesia Plan  ASA: 3 and emergent  Anesthesia Plan: General   Post-op Pain Management: Dilaudid IV   Induction: Intravenous, Rapid sequence and Cricoid pressure planned  PONV Risk Score and Plan: 4 or greater and Ondansetron and Dexamethasone  Airway Management Planned: Oral ETT and Video Laryngoscope Planned  Additional Equipment:   Intra-op Plan:   Post-operative Plan: Extubation in OR and Possible Post-op intubation/ventilation  Informed Consent: I have reviewed the patients History and Physical, chart, labs and discussed the procedure including the risks, benefits and alternatives for the  proposed anesthesia with the patient or authorized representative who has indicated his/her understanding and acceptance.     Dental advisory given  Plan Discussed with: CRNA and Surgeon  Anesthesia Plan Comments:        Anesthesia Quick Evaluation

## 2022-11-22 NOTE — Consult Note (Signed)
[facility] Consult  Reason for Consult: Incarcerated umbilical hernia Referring Physician: Dr. Christy Gentles  Chief Complaint   Abdominal Pain     HPI: Kyle Burns is a 56 y.o. male who presents with a 1 day history of periumbilical abdominal pain.  He states that after eating [place] food yesterday, he began having worsening pain with nausea.  He denies any episodes of emesis.  He confirms knowing that he had an umbilical hernia, but he has never attempted to reduce it or had significant pain like this in his hernia location.  He denies any fevers or chills.  He was passing gas this morning, and last had a bowel movement yesterday morning.  His surgical history is significant for bilateral open inguinal hernia repairs.  He denies ever having a hernia repair at his umbilicus.  He takes 81 mg aspirin x 2 daily, with last dose being received yesterday.  His past medical history significant for hypertension, hyperlipidemia, obstructive sleep apnea on BiPAP.  In the ED, he underwent a CT abdomen pelvis which demonstrated an umbilical hernia containing loop of small bowel with decompressed loop distal, concerning for small bowel obstruction.  There was no evidence of bowel compromise.  The ED attempted to reduce this umbilical hernia, without success.  He has a leukocytosis of 14.5.  Past Medical History:  Diagnosis Date   Asthma    hx of    Back pain    Bilevel positive airway pressure (BPAP) dependence    GERD (gastroesophageal reflux disease)    HTN (hypertension)    Hyperlipidemia     Past Surgical History:  Procedure Laterality Date   bilateral hernia repair     total of 3    CYST REMOVAL NECK     age - 20s    Family History  Problem Relation Age of Onset   Asthma Father    Cancer Father        stomach   Arthritis Father    Asthma Maternal Grandfather    Diabetes Maternal Grandfather    Stroke Maternal Grandfather    Asthma Paternal Grandfather     Crohn's disease Mother    Arthritis Mother        back pain    Diabetes Mother    Hypertension Sister    Multiple sclerosis Brother    Hypertension Maternal Grandmother     Social History   Tobacco Use   Smoking status: Never   Smokeless tobacco: Former    Types: Chew    Quit date: 10/2019   Tobacco comments:    Uses chew "when he has a bad day"  Vaping Use   Vaping Use: Never used  Substance Use Topics   Alcohol use: No    Comment: Socially   Drug use: No    Medications: I have reviewed the patient's current medications.  No Known Allergies   ROS:  Pertinent items are noted in HPI.  Blood pressure (!) 112/98, pulse 78, temperature 98 F (36.7 C), temperature source Oral, resp. rate (!) 24, height 5\' 11"  (1.803 m), weight 131.5 kg, SpO2 97 %. Physical Exam Vitals reviewed.  Constitutional:      Appearance: He is well-developed. He is obese.  HENT:     Head: Normocephalic and atraumatic.  Eyes:     Extraocular Movements: Extraocular movements intact.     Pupils: Pupils are equal, round, and reactive to light.  Cardiovascular:     Rate and Rhythm: Normal rate.  Pulmonary:  Effort: Pulmonary effort is normal.  Abdominal:     Comments: Abdomen soft and protuberant, mild distention, no percussion tenderness, tenderness to palpation at umbilicus; no rigidity, guarding, rebound tenderness; umbilical hernia is able to be reduced, however contents immediately reherniate after pressure is removed  Skin:    General: Skin is warm and dry.  Neurological:     General: No focal deficit present.     Mental Status: He is alert and oriented to person, place, and time.  Psychiatric:        Mood and Affect: Mood normal.        Behavior: Behavior normal.     Results: Results for orders placed or performed during the hospital encounter of 11/22/22 (from the past 48 hour(s))  Comprehensive metabolic panel     Status: Abnormal   Collection Time: 11/22/22  6:07 AM  Result  Value Ref Range   Sodium 139 135 - 145 mmol/L   Potassium 3.7 3.5 - 5.1 mmol/L   Chloride 105 98 - 111 mmol/L   CO2 28 22 - 32 mmol/L   Glucose, Bld 139 (H) 70 - 99 mg/dL    Comment: Glucose reference range applies only to samples taken after fasting for at least 8 hours.   BUN 21 (H) 6 - 20 mg/dL   Creatinine, Ser 1.03 0.61 - 1.24 mg/dL   Calcium 9.4 8.9 - 10.3 mg/dL   Total Protein 8.0 6.5 - 8.1 g/dL   Albumin 4.5 3.5 - 5.0 g/dL   AST 24 15 - 41 U/L   ALT 25 0 - 44 U/L   Alkaline Phosphatase 89 38 - 126 U/L   Total Bilirubin 0.7 0.3 - 1.2 mg/dL   GFR, Estimated >60 >60 mL/min    Comment: (NOTE) Calculated using the CKD-EPI Creatinine Equation (2021)    Anion gap 6 5 - 15    Comment: Performed at [facility], [address] [place], [place] [address]  Lipase, blood     Status: None   Collection Time: 11/22/22  6:07 AM  Result Value Ref Range   Lipase 45 11 - 51 U/L    Comment: Performed at [facility], [address] [place], [place] [address]  CBC with Diff     Status: Abnormal   Collection Time: 11/22/22  6:07 AM  Result Value Ref Range   WBC 14.5 (H) 4.0 - 10.5 K/uL   RBC 5.19 4.22 - 5.81 MIL/uL   Hemoglobin 14.9 13.0 - 17.0 g/dL   HCT 46.0 39.0 - 52.0 %   MCV 88.6 80.0 - 100.0 fL   MCH 28.7 26.0 - 34.0 pg   MCHC 32.4 30.0 - 36.0 g/dL   RDW 13.1 11.5 - 15.5 %   Platelets 323 150 - 400 K/uL   nRBC 0.0 0.0 - 0.2 %   Neutrophils Relative % 84 %   Neutro Abs 12.2 (H) 1.7 - 7.7 K/uL   Lymphocytes Relative 9 %   Lymphs Abs 1.4 0.7 - 4.0 K/uL   Monocytes Relative 6 %   Monocytes Absolute 0.9 0.1 - 1.0 K/uL   Eosinophils Relative 0 %   Eosinophils Absolute 0.0 0.0 - 0.5 K/uL   Basophils Relative 0 %   Basophils Absolute 0.0 0.0 - 0.1 K/uL   Immature Granulocytes 1 %   Abs Immature Granulocytes 0.08 (H) 0.00 - 0.07 K/uL    Comment: Performed at [facility], [address] [place], [place] [address]  I-stat chem 8, ED (not at  [facility], [facility] or [facility])     Status: Abnormal    Collection Time: 11/22/22  6:35 AM  Result Value Ref Range   Sodium 141 135 - 145 mmol/L   Potassium 3.9 3.5 - 5.1 mmol/L   Chloride 101 98 - 111 mmol/L   BUN 21 (H) 6 - 20 mg/dL   Creatinine, Ser 0.73 0.61 - 1.24 mg/dL   Glucose, Bld 710 (H) 70 - 99 mg/dL    Comment: Glucose reference range applies only to samples taken after fasting for at least 8 hours.   Calcium, Ion 1.18 1.15 - 1.40 mmol/L   TCO2 28 22 - 32 mmol/L   Hemoglobin 16.3 13.0 - 17.0 g/dL   HCT 62.6 94.8 - 54.6 %  Urinalysis, Routine w reflex microscopic Urine, Clean Catch     Status: Abnormal   Collection Time: 11/22/22  8:10 AM  Result Value Ref Range   Color, Urine YELLOW YELLOW   APPearance CLEAR CLEAR   Specific Gravity, Urine >1.046 (H) 1.005 - 1.030   pH 6.0 5.0 - 8.0   Glucose, UA NEGATIVE NEGATIVE mg/dL   Hgb urine dipstick NEGATIVE NEGATIVE   Bilirubin Urine NEGATIVE NEGATIVE   Ketones, ur NEGATIVE NEGATIVE mg/dL   Protein, ur 30 (A) NEGATIVE mg/dL   Nitrite NEGATIVE NEGATIVE   Leukocytes,Ua NEGATIVE NEGATIVE   RBC / HPF 0-5 0 - 5 RBC/hpf   WBC, UA 0-5 0 - 5 WBC/hpf   Bacteria, UA RARE (A) NONE SEEN   Mucus PRESENT     Comment: Performed at [facility], [address], [place], [place] [address]    CT ABDOMEN PELVIS W CONTRAST  Result Date: 11/22/2022 CLINICAL DATA:  Abdominal pain. EXAM: CT ABDOMEN AND PELVIS WITH CONTRAST TECHNIQUE: Multidetector CT imaging of the abdomen and pelvis was performed using the standard protocol following bolus administration of intravenous contrast. RADIATION DOSE REDUCTION: This exam was performed according to the departmental dose-optimization program which includes automated exposure control, adjustment of the mA and/or kV according to patient size and/or use of iterative reconstruction technique. CONTRAST:  OMNIPAQUE IOHEXOL 300 MG/ML  SOLN COMPARISON:  None Available. FINDINGS: Lower chest: 4 mm right lung nodule identified on image 5/4. Dependent atelectasis  noted in the right lower lobe. Hepatobiliary: The liver shows diffusely decreased attenuation suggesting fat deposition. There is no evidence for gallstones, gallbladder wall thickening, or pericholecystic fluid. No intrahepatic or extrahepatic biliary dilation. Pancreas: No focal mass lesion. No dilatation of the main duct. No intraparenchymal cyst. No peripancreatic edema. Spleen: No splenomegaly. No focal mass lesion. Adrenals/Urinary Tract: No adrenal nodule or mass. Kidneys unremarkable. No evidence for hydroureter. The urinary bladder appears normal for the degree of distention. Stomach/Bowel: Stomach is distended with gas and fluid. Duodenum is normally positioned as is the ligament of Treitz. Small bowel loops in the anterior abdomen are distended up to 4.3 cm diameter. The most dilated loop extends anteriorly into an umbilical hernia with fecalization of enteric contents proximal to the herniated segment. Small bowel leading the umbilical hernia and re-entering the peritoneal cavity is completely decompressed. The terminal ileum is normal. The appendix is normal. No gross colonic mass. No colonic wall thickening. Vascular/Lymphatic: No abdominal aortic aneurysm. No abdominal aortic atherosclerotic calcification. There is no gastrohepatic or hepatoduodenal ligament lymphadenopathy. No retroperitoneal or mesenteric lymphadenopathy. No pelvic sidewall lymphadenopathy. Reproductive: The prostate gland and seminal vesicles are unremarkable. Other: No intraperitoneal free fluid. Musculoskeletal: Evidence of prior bilateral groin herniorrhaphy evident with recurrent right groin  hernia containing only fat. No worrisome lytic or sclerotic osseous abnormality. IMPRESSION: 1. Small bowel obstruction secondary to an incarcerated umbilical hernia. The most dilated loop proximal to the hernia demonstrates fecalization of enteric contents compatible with decreased transit. No small bowel wall thickening or pneumatosis. No  substantial perienteric edema or intraperitoneal free fluid. 2. Hepatic steatosis. 3. 4 mm right lung nodule. No follow-up needed if patient is low-risk.This recommendation follows the consensus statement: Guidelines for Management of Incidental Pulmonary Nodules Detected on CT Images: From the Fleischner Society 2017; Radiology 2017; 284:228-243. Electronically Signed   By: Misty Stanley M.D.   On: 11/22/2022 07:17     Assessment & Plan:  Kyle Burns is a 56 y.o. male who presents with a 1 day history of abdominal pain.  CT abdomen and pelvis is demonstrating a small bowel obstruction secondary to an incarcerated umbilical hernia.  WBC 14.5.  Imaging and blood work evaluated by myself.  -I explained the pathophysiology of hernias to the patient, and why we recommend repair.  Even though the hernia is soft and reducible, the contents will herniate out after I removed pressure.  For this reason, I recommend that we repair the hernia at this time to prevent him from redeveloping a bowel obstruction -I explained that during the surgery, I will evaluate the viability of the bowel.  If I need to remove any section of small bowel, I will not use mesh during hernia repair.  I will also not use mesh if I am concerned about significant inflammation to the surrounding tissues.  However, if the bowel appears viable and there is no significant surrounding inflammation, I will plan to repair his hernia with mesh.  The patient is aware that his hernia is at risk for recurring, especially if primary repair is performed without mesh -The risk and benefits of open umbilical hernia repair, possible mesh, possible bowel resection were discussed including but not limited to bleeding, infection, injury to surrounding structures, need for additional procedures, and hernia recurrence.  After careful consideration, Kyle Burns has decided to proceed with surgery. -NPO -Prophylactic Kefzol ordered -IV fluids -PRN pain  control and antiemetics -Further recommendations to follow surgery -Appreciate hospitalist recommendations  All questions were answered to the satisfaction of the patient and family.  -- Graciella Freer, DO [facility] [address] [place], [place] [address] [phone] (office)

## 2022-11-22 NOTE — ED Notes (Signed)
Patient changing into hospital gown at this time. Hospitalist at bedside assessing patient. Consent for surgical procedure signed.

## 2022-11-22 NOTE — Op Note (Addendum)
[facility] Operative Note  11/22/22  Preoperative Diagnosis: Incarcerated umbilical hernia   Postoperative Diagnosis: Same   Procedure(s) Performed: Open umbilical hernia repair with mesh   Surgeon: Theophilus Kinds, DO    Assistants: No qualified resident was available    Anesthesia: General endotracheal   Anesthesiologist: Molli Barrows, MD    Specimens: Hernia sac   Estimated Blood Loss: Minimal   Blood Replacement: None    Complications: None   Wound Class: Clean   Operative Indications: Patient is a 56 year old male who presented with a 1 day history of periumbilical abdominal pain.  He underwent a CT abdomen and pelvis in the emergency department, which demonstrated an incarcerated umbilical hernia containing a loop of small bowel resulting in small bowel obstruction.  He had a mild leukocytosis of 14.5.  While the hernia is reducible, it immediately reherniates.  For this reason, decision was made to proceed with surgery.  Patient is agreeable to surgery.  All risks and benefits of performing this procedure were discussed with the patient including pain, infection, bleeding, damage to the surrounding structures, and need for more procedures or surgery. The patient voiced understanding of the procedure, all questions were sought and answered, and consent was obtained.  Findings: 6 x 5 cm umbilical hernia containing a loop of small bowel, no evidence of ischemia or necrosis of small bowel   Procedure: The patient was taken to the operating room and placed supine. General endotracheal anesthesia was induced. Intravenous antibiotics were administered per protocol.  An orogastric tube positioned to decompress the stomach. The abdomen was prepared and draped in the usual sterile fashion.  Timeout was performed.  The umbilical hernia was noted to be reducible.  An incision was made under the umbilicus, and carried down through the subcutaneous tissue  with electrocautery.  Dissection was performed down to the level of the fascia, exposing the hernia sac.  The hernia sac was opened with care, to evaluate the small bowel.  The small bowel was noted to be viable with some adhesions to the hernia sac.  These adhesions were taken down with Metzenbaum scissors.  The bowel was then completely reduced into the abdomen.  The hernia sac was then dissected from the surrounding subcutaneous tissue and skin.  The hernia sac was completely excised and sent to pathology for evaluation.  A finger was ran on the underlying peritoneum and this was clear.  No additional hernia defects were palpable.  The final fascial defect measured 6 x 5 cm.  An 11.4 cm Ventralex St Hernia Patch was placed and secured with 0 Ethibond sutures ensuring that it was against the peritoneal cavity.  The hernia defect was then closed with 0 Ethibond suture in an interrupted figure-of-eight fashion over the patch.  The umbilicus was tacked to the fascia with a 2-0 Vicryl suture.   Hemostasis was confirmed.  Exparel was instilled.  Subcutaneous tissues were irrigated with saline.  The skin was closed with a running 4-0 Monocryl suture and dermabond.  After the dermabond dried, a 4x4 and tegaderm were placed over the umbilicus to act as a pressure dressing.    Final inspection revealed acceptable hemostasis. All counts were correct at the end of the case. The patient was awakened from anesthesia and extubated without complication.  The patient went to the PACU in stable condition.  Abdominal binder was placed in PACU.   Theophilus Kinds, DO  [facility] [id number] [employer] [place], [place] [address] [phone] (  office)

## 2022-11-23 DIAGNOSIS — J4520 Mild intermittent asthma, uncomplicated: Secondary | ICD-10-CM

## 2022-11-23 DIAGNOSIS — G4733 Obstructive sleep apnea (adult) (pediatric): Secondary | ICD-10-CM

## 2022-11-23 DIAGNOSIS — E785 Hyperlipidemia, unspecified: Secondary | ICD-10-CM | POA: Diagnosis not present

## 2022-11-23 DIAGNOSIS — K219 Gastro-esophageal reflux disease without esophagitis: Secondary | ICD-10-CM | POA: Diagnosis not present

## 2022-11-23 DIAGNOSIS — K420 Umbilical hernia with obstruction, without gangrene: Secondary | ICD-10-CM | POA: Diagnosis not present

## 2022-11-23 DIAGNOSIS — R911 Solitary pulmonary nodule: Secondary | ICD-10-CM

## 2022-11-23 LAB — CBC
HCT: 45 % (ref 39.0–52.0)
Hemoglobin: 14.8 g/dL (ref 13.0–17.0)
MCH: 29.5 pg (ref 26.0–34.0)
MCHC: 32.9 g/dL (ref 30.0–36.0)
MCV: 89.6 fL (ref 80.0–100.0)
Platelets: 359 10*3/uL (ref 150–400)
RBC: 5.02 MIL/uL (ref 4.22–5.81)
RDW: 13.1 % (ref 11.5–15.5)
WBC: 14.9 10*3/uL — ABNORMAL HIGH (ref 4.0–10.5)
nRBC: 0 % (ref 0.0–0.2)

## 2022-11-23 LAB — HIV ANTIBODY (ROUTINE TESTING W REFLEX): HIV Screen 4th Generation wRfx: NONREACTIVE

## 2022-11-23 LAB — BASIC METABOLIC PANEL
Anion gap: 7 (ref 5–15)
BUN: 17 mg/dL (ref 6–20)
CO2: 29 mmol/L (ref 22–32)
Calcium: 8.8 mg/dL — ABNORMAL LOW (ref 8.9–10.3)
Chloride: 101 mmol/L (ref 98–111)
Creatinine, Ser: 1.06 mg/dL (ref 0.61–1.24)
GFR, Estimated: >60 mL/min (ref >60)
Glucose, Bld: 121 mg/dL — ABNORMAL HIGH (ref 70–99)
Potassium: 3.7 mmol/L (ref 3.5–5.1)
Sodium: 137 mmol/L (ref 135–145)

## 2022-11-23 MED ORDER — IPRATROPIUM-ALBUTEROL 0.5-2.5 (3) MG/3ML IN SOLN
3.0000 mL | Freq: Two times a day (BID) | RESPIRATORY_TRACT | Status: DC
  Administered 2022-11-24: 3 mL via RESPIRATORY_TRACT
  Filled 2022-11-23 (×2): qty 3

## 2022-11-23 MED ORDER — POLYETHYLENE GLYCOL 3350 17 G PO PACK
17.0000 g | PACK | Freq: Every day | ORAL | Status: DC
  Administered 2022-11-23 – 2022-11-25 (×3): 17 g via ORAL
  Filled 2022-11-23 (×3): qty 1

## 2022-11-23 NOTE — Progress Notes (Signed)
Progress Note   Patient: Kyle Burns VZS:[id number] DOB: 1966-09-24 DOA: 11/22/2022     1 DOS: the patient was seen and examined on 11/23/2022   Brief hospital admission course: Kyle Burns  is a 56 y.o. male, with past medical history of hypertension, asthma, GERD, hyperlipidemia, O SA/OHS, BiPAP dependent with oxygen at nighttime. -Patient presents to ED secondary to complaints of abdominal pain, developed yesterday, started having generalized abdominal pain, nausea, after eating [place] food, he denies any vomiting, he does report dry heaving as well, he denies any fever, chills, no dysuria or polyuria, he denies any obstipation or constipation, no diarrhea, no bright red blood per rectum, no chest pain, orts pain has worsened overnight which prompted him to come to ED -In ED patient was noted to have tender umbilical hernia, CT abdomen pelvis confirms incarcerated umbilical hernia, it was seen by general surgery with plan for surgical repair today, [facility] hospitalist consulted to admit.   Assessment and Plan: 1-incarcerated umbilical hernia/SBO -Status post surgical intervention -Appears to has tolerated procedure well -Passing gas and tolerating clear liquids; no nausea or vomiting -Continue to maintain adequate hydration and follow electrolytes -Diet advancements and postsurgical care as per general surgery.  2-hypertension stable currently -Continue to follow vital sign -Will continue to hold on antihypertensive agents until fully tolerating by mouth.  3-obstructive sleep apnea/obesity hypoventilation syndrome -Continue to wean off oxygen supplementation as tolerated -Continue BiPAP nightly -Patient denies shortness of breath sensation currently and is demonstrating good oxygen saturation.  4-hyperlipidemia -Planning to resume statin at discharge.  5-gastroesophageal flux disease -Continue PPI.  6-history of asthma -Appears to be mild and intermittent -Continue as  needed bronchodilator management.  7-class III obesity -Body mass index is 39.7 kg/m. -Low-calorie diet, portion control and increase physical activity discussed with patient.  8-incidental 4 mm right lung nodule -Outpatient follow-up with pulmonary service recommended.  Subjective:  Afebrile, no chest pain, no nausea or vomiting.  Tolerating clear liquid.  Reporting passing gas but no bowel movement.  Having pain around incision in his abdomen.  Physical Exam: Vitals:   11/23/22 1200 11/23/22 1300 11/23/22 1313 11/23/22 1531  BP: 121/76  109/69   Pulse: 79  84   Resp: 16  17   Temp:   99 F (37.2 C)   TempSrc:   Oral   SpO2: 99%  95% 97%  Weight:  129.1 kg    Height:  5\' 11"  (1.803 m)     General exam: Alert, awake, oriented x 3; reports no nausea, no vomiting.  Still having some abdominal pain around incision.  Passing gas but no bowel movement.  Tolerated clear liquid diet Respiratory system: Good oxygen saturation on 2-3 L nasal cannula supplementation.  No using accessory muscle.  No wheezing or crackles on exam. Cardiovascular system:RRR. No rubs or gallops.  No JVD. Gastrointestinal system: Abdomen is appropriately tender around incision; slightly distended, soft, positive bowel sounds. Central nervous system: Alert and oriented. No focal neurological deficits. Extremities: No cyanosis or clubbing. Skin: No petechiae. Psychiatry: Judgement and insight appear normal. Mood & affect appropriate.   Data Reviewed: Basic metabolic panel: Sodium 137, potassium 3.7, chloride 101, bicarb 29, BUN 17, creatinine 1.06 CBC: WBCs 14.9, hemoglobin 14.8 and platelet count 359 K  Family Communication: No family at bedside.  Disposition: Status is: Inpatient Remains inpatient appropriate because: Status post surgical intervention due to incarcerated hernia; waiting for resubmission bowel function.  Continue supportive care and IV analgesics.   Planned Discharge  Destination:  Home  Time spent: 35 minutes  Author: Vassie Loll, MD 11/23/2022 5:13 PM  For on call review [email or web address].

## 2022-11-23 NOTE — Progress Notes (Signed)
[facility] Progress Note  1 Day Post-Op  Subjective: Patient seen and examined.  He is resting comfortably in chair at the side of the bed.  He tolerated clear liquids without nausea and vomiting.  He is passing flatus, but denies any bowel movements.  He has pain at his incision site, but is otherwise doing okay.  Objective: Vital signs in last 24 hours: Temp:  [98.1 F (36.7 C)-99.5 F (37.5 C)] 98.9 F (37.2 C) (12/10 1100) Pulse Rate:  [64-100] 79 (12/10 1200) Resp:  [14-26] 16 (12/10 1200) BP: (96-130)/(58-76) 121/76 (12/10 1200) SpO2:  [80 %-100 %] 99 % (12/10 1200) FiO2 (%):  [35 %-100 %] 35 % (12/10 0157) Last BM Date : 11/22/22  Intake/Output from previous day: 12/09 0701 - 12/10 0700 In: 4164.9 [I.V.:3064.9; IV Piggyback:1100] Out: 1425 [Urine:800] Intake/Output this shift: Total I/O In: -  Out: 900 [Urine:900]  General appearance: alert, cooperative, and no distress GI: Abdomen soft, mild distention, no percussion tenderness, mild incisional tenderness to palpation; no rigidity, guarding, rebound tenderness; incision with overlying dressing, no strikethrough  Lab Results:  Recent Labs    11/22/22 0607 11/22/22 0635 11/23/22 0346  WBC 14.5*  --  14.9*  HGB 14.9 16.3 14.8  HCT 46.0 48.0 45.0  PLT 323  --  359   BMET Recent Labs    11/22/22 0607 11/22/22 0635 11/23/22 0346  NA 139 141 137  K 3.7 3.9 3.7  CL 105 101 101  CO2 28  --  29  GLUCOSE 139* 139* 121*  BUN 21* 21* 17  CREATININE 1.03 1.10 1.06  CALCIUM 9.4  --  8.8*   PT/INR No results for input(s): "LABPROT", "INR" in the last 72 hours.  Studies/Results: CT ABDOMEN PELVIS W CONTRAST  Result Date: 11/22/2022 CLINICAL DATA:  Abdominal pain. EXAM: CT ABDOMEN AND PELVIS WITH CONTRAST TECHNIQUE: Multidetector CT imaging of the abdomen and pelvis was performed using the standard protocol following bolus administration of intravenous contrast. RADIATION DOSE REDUCTION: This  exam was performed according to the departmental dose-optimization program which includes automated exposure control, adjustment of the mA and/or kV according to patient size and/or use of iterative reconstruction technique. CONTRAST:  OMNIPAQUE IOHEXOL 300 MG/ML  SOLN COMPARISON:  None Available. FINDINGS: Lower chest: 4 mm right lung nodule identified on image 5/4. Dependent atelectasis noted in the right lower lobe. Hepatobiliary: The liver shows diffusely decreased attenuation suggesting fat deposition. There is no evidence for gallstones, gallbladder wall thickening, or pericholecystic fluid. No intrahepatic or extrahepatic biliary dilation. Pancreas: No focal mass lesion. No dilatation of the main duct. No intraparenchymal cyst. No peripancreatic edema. Spleen: No splenomegaly. No focal mass lesion. Adrenals/Urinary Tract: No adrenal nodule or mass. Kidneys unremarkable. No evidence for hydroureter. The urinary bladder appears normal for the degree of distention. Stomach/Bowel: Stomach is distended with gas and fluid. Duodenum is normally positioned as is the ligament of Treitz. Small bowel loops in the anterior abdomen are distended up to 4.3 cm diameter. The most dilated loop extends anteriorly into an umbilical hernia with fecalization of enteric contents proximal to the herniated segment. Small bowel leading the umbilical hernia and re-entering the peritoneal cavity is completely decompressed. The terminal ileum is normal. The appendix is normal. No gross colonic mass. No colonic wall thickening. Vascular/Lymphatic: No abdominal aortic aneurysm. No abdominal aortic atherosclerotic calcification. There is no gastrohepatic or hepatoduodenal ligament lymphadenopathy. No retroperitoneal or mesenteric lymphadenopathy. No pelvic sidewall lymphadenopathy. Reproductive: The prostate gland and seminal  vesicles are unremarkable. Other: No intraperitoneal free fluid. Musculoskeletal: Evidence of prior bilateral  groin herniorrhaphy evident with recurrent right groin hernia containing only fat. No worrisome lytic or sclerotic osseous abnormality. IMPRESSION: 1. Small bowel obstruction secondary to an incarcerated umbilical hernia. The most dilated loop proximal to the hernia demonstrates fecalization of enteric contents compatible with decreased transit. No small bowel wall thickening or pneumatosis. No substantial perienteric edema or intraperitoneal free fluid. 2. Hepatic steatosis. 3. 4 mm right lung nodule. No follow-up needed if patient is low-risk.This recommendation follows the consensus statement: Guidelines for Management of Incidental Pulmonary Nodules Detected on CT Images: From the Fleischner Society 2017; Radiology 2017; 284:228-243. Electronically Signed   By: Kennith Center M.D.   On: 11/22/2022 07:17    Anti-infectives: Anti-infectives (From admission, onward)    Start     Dose/Rate Route Frequency Ordered Stop   11/22/22 1400  ceFAZolin (ANCEF) IVPB 3g/100 mL premix        3 g 200 mL/hr over 30 Minutes Intravenous  Once 11/22/22 1202 11/22/22 1245   11/22/22 1208  ceFAZolin (ANCEF) 3-0.9 GM/100ML-% IVPB       Note to Pharmacy: Elvina Sidle S: cabinet override      11/22/22 1208 11/22/22 1310   11/22/22 1200  ceFAZolin (ANCEF) IVPB 2g/100 mL premix  Status:  Discontinued        2 g 200 mL/hr over 30 Minutes Intravenous Every 8 hours 11/22/22 1149 11/22/22 1202       Assessment/Plan:  Patient is a 56 year old male who was admitted with an incarcerated umbilical hernia and is status post open umbilical hernia repair with mesh on 12/9.  -Patient with leukocytosis this morning, suspect that this is reactive postoperatively -Tolerated clears without issue, advance to full liquid diet -Discontinue Foley -PRN pain control and antiemetics -MiraLAX ordered -Monitor for return of bowel function -Maintain abdominal binder at all times except while bathing and sleeping -Appreciate hospitalist  recommendations   LOS: 1 day    [place] A Kendal Raffo 11/23/2022

## 2022-11-23 NOTE — Progress Notes (Signed)
Patient off BiPAP on room air. Slept for about 5 hrs.

## 2022-11-24 ENCOUNTER — Inpatient Hospital Stay (HOSPITAL_COMMUNITY): Payer: No Typology Code available for payment source

## 2022-11-24 ENCOUNTER — Other Ambulatory Visit (HOSPITAL_COMMUNITY): Payer: Self-pay | Admitting: *Deleted

## 2022-11-24 DIAGNOSIS — E785 Hyperlipidemia, unspecified: Secondary | ICD-10-CM | POA: Diagnosis not present

## 2022-11-24 DIAGNOSIS — K420 Umbilical hernia with obstruction, without gangrene: Secondary | ICD-10-CM | POA: Diagnosis not present

## 2022-11-24 DIAGNOSIS — K219 Gastro-esophageal reflux disease without esophagitis: Secondary | ICD-10-CM | POA: Diagnosis not present

## 2022-11-24 DIAGNOSIS — G4733 Obstructive sleep apnea (adult) (pediatric): Secondary | ICD-10-CM | POA: Diagnosis not present

## 2022-11-24 LAB — CBC
HCT: 44.3 % (ref 39.0–52.0)
Hemoglobin: 14.6 g/dL (ref 13.0–17.0)
MCH: 29 pg (ref 26.0–34.0)
MCHC: 33 g/dL (ref 30.0–36.0)
MCV: 87.9 fL (ref 80.0–100.0)
Platelets: 322 10*3/uL (ref 150–400)
RBC: 5.04 MIL/uL (ref 4.22–5.81)
RDW: 13.2 % (ref 11.5–15.5)
WBC: 10.2 10*3/uL (ref 4.0–10.5)
nRBC: 0 % (ref 0.0–0.2)

## 2022-11-24 LAB — TROPONIN I (HIGH SENSITIVITY)
Troponin I (High Sensitivity): 10 ng/L (ref <18)
Troponin I (High Sensitivity): 9 ng/L (ref <18)

## 2022-11-24 MED ORDER — METOPROLOL TARTRATE 5 MG/5ML IV SOLN
INTRAVENOUS | Status: AC
  Administered 2022-11-24: 5 mg
  Filled 2022-11-24: qty 5

## 2022-11-24 MED ORDER — PNEUMOCOCCAL VAC POLYVALENT 25 MCG/0.5ML IJ INJ
0.5000 mL | INJECTION | INTRAMUSCULAR | Status: DC
  Filled 2022-11-24: qty 0.5

## 2022-11-24 MED ORDER — PROCHLORPERAZINE EDISYLATE 10 MG/2ML IJ SOLN
10.0000 mg | Freq: Four times a day (QID) | INTRAMUSCULAR | Status: DC | PRN
  Administered 2022-11-24 – 2022-11-25 (×4): 10 mg via INTRAVENOUS
  Filled 2022-11-24 (×5): qty 2

## 2022-11-24 MED ORDER — SODIUM CHLORIDE 0.9 % IV SOLN: INTRAVENOUS | Status: AC

## 2022-11-24 MED ORDER — PANTOPRAZOLE SODIUM 40 MG IV SOLR
40.0000 mg | Freq: Two times a day (BID) | INTRAVENOUS | Status: DC
  Administered 2022-11-24 – 2022-12-04 (×20): 40 mg via INTRAVENOUS
  Filled 2022-11-24 (×20): qty 10

## 2022-11-24 MED ORDER — MAGNESIUM SULFATE 2 GM/50ML IV SOLN
2.0000 g | Freq: Once | INTRAVENOUS | Status: AC
  Administered 2022-11-24: 2 g via INTRAVENOUS
  Filled 2022-11-24: qty 50

## 2022-11-24 MED ORDER — METOPROLOL TARTRATE 5 MG/5ML IV SOLN: 5.0000 mg | Freq: Once | INTRAVENOUS | Status: AC

## 2022-11-24 MED ORDER — ENOXAPARIN SODIUM 150 MG/ML IJ SOSY
130.0000 mg | PREFILLED_SYRINGE | Freq: Two times a day (BID) | INTRAMUSCULAR | Status: DC
  Administered 2022-11-24 – 2022-11-26 (×4): 130 mg via SUBCUTANEOUS
  Filled 2022-11-24 (×8): qty 1

## 2022-11-24 MED ORDER — DILTIAZEM HCL-DEXTROSE 125-5 MG/125ML-% IV SOLN (PREMIX)
5.0000 mg/h | INTRAVENOUS | Status: DC
  Administered 2022-11-24: 15 mg/h via INTRAVENOUS
  Administered 2022-11-25: 10 mg/h via INTRAVENOUS
  Filled 2022-11-24 (×5): qty 125

## 2022-11-24 MED ORDER — DILTIAZEM LOAD VIA INFUSION
10.0000 mg | Freq: Once | INTRAVENOUS | Status: AC
  Administered 2022-11-24: 10 mg via INTRAVENOUS
  Filled 2022-11-24: qty 10

## 2022-11-24 MED ORDER — ONDANSETRON HCL 4 MG/2ML IJ SOLN
4.0000 mg | Freq: Four times a day (QID) | INTRAMUSCULAR | Status: DC | PRN
  Administered 2022-11-24 – 2022-11-25 (×4): 4 mg via INTRAVENOUS
  Filled 2022-11-24 (×4): qty 2

## 2022-11-24 MED ORDER — ALUM & MAG HYDROXIDE-SIMETH 200-200-20 MG/5ML PO SUSP
30.0000 mL | ORAL | Status: DC | PRN
  Administered 2022-11-24 – 2022-11-25 (×2): 30 mL via ORAL
  Filled 2022-11-24 (×2): qty 30

## 2022-11-24 NOTE — TOC Progression Note (Signed)
Transition of Care [facility]) - Progression Note    Patient Details  Name: Kyle Burns MRN: [id number] Date of Birth: 12-01-1966  Transition of Care [facility]) CM/SW Contact  Karn Cassis, [place] Phone Number: 11/24/2022, 10:37 AM  Clinical Narrative:  Transition of Care [facility]) Screening Note   Patient Details  Name: Kyle Burns Date of Birth: [birth year]   Transition of Care [facility]) CM/SW Contact:    Karn Cassis, LCSW Phone Number: 11/24/2022, 10:37 AM    Transition of Care Department [facility]) has reviewed patient and no TOC needs have been identified at this time. We will continue to monitor patient advancement through interdisciplinary progression rounds. If new patient transition needs arise, please place a TOC consult.             Expected Discharge Plan and Services                                                 Social Determinants of Health (SDOH) Interventions Housing Interventions: Intervention Not Indicated  Readmission Risk Interventions     No data to display

## 2022-11-24 NOTE — Progress Notes (Signed)
Report given to ICU, patient transferring to room 6 for uncontrolled HR 160's, EKG revealed A-fib with RVR. Pateitn is aware of transfer and all belongings given.

## 2022-11-24 NOTE — Progress Notes (Addendum)
ANTICOAGULATION CONSULT NOTE - Initial Consult  Pharmacy Consult for Enoxaparin Indication: atrial fibrillation  No Known Allergies  Patient Measurements: Height: 5\' 11"  (180.3 cm) Weight: 128.3 kg (282 lb 14.4 oz) IBW/kg (Calculated) : 75.3  Vital Signs: Temp: 99.1 F (37.3 C) (12/11 1238) Temp Source: Oral (12/11 1238) BP: 98/67 (12/11 0557) Pulse Rate: 87 (12/11 0557)  Labs: Recent Labs    11/22/22 0607 11/22/22 0635 11/23/22 0346 11/24/22 0354  HGB 14.9 16.3 14.8 14.6  HCT 46.0 48.0 45.0 44.3  PLT 323  --  359 322  CREATININE 1.03 1.10 1.06  --     Estimated Creatinine Clearance: 103.7 mL/min (by C-G formula based on SCr of 1.06 mg/dL).   Medical History: Past Medical History:  Diagnosis Date   Asthma    hx of    Back pain    Bilevel positive airway pressure (BPAP) dependence    GERD (gastroesophageal reflux disease)    HTN (hypertension)    Hyperlipidemia     Medications:  Medications Prior to Admission  Medication Sig Dispense Refill Last Dose   albuterol (VENTOLIN HFA) 108 (90 Base) MCG/ACT inhaler USE 2 PUFFS EVERY 6 HOURS AS NEEDED FOR WHEEZING (1 for home, 1 for work) (Patient taking differently: Inhale 2 puffs into the lungs every 6 (six) hours as needed for wheezing.) 36 g 4 UNK   aspirin 81 MG EC tablet Take 162 mg by mouth in the morning.   11/22/2022   atorvastatin (LIPITOR) 10 MG tablet Take 1 tablet (10 mg total) by mouth daily. (Patient taking differently: Take 10 mg by mouth at bedtime.) 90 tablet 3 Past Week   budesonide-formoterol (SYMBICORT) 80-4.5 MCG/ACT inhaler Inhale 2 puffs into the lungs 2 (two) times daily. (Patient taking differently: Inhale 2 puffs into the lungs daily as needed (wheezing).) 30.6 g 4 UNK   esomeprazole (NEXIUM) 40 MG capsule Take 1 capsule (40 mg total) by mouth daily. (Patient taking differently: Take 40 mg by mouth in the morning.) 90 capsule 3 11/22/2022   famotidine (PEPCID) 20 MG tablet One after supper (Patient  taking differently: Take 20 mg by mouth every evening. After supper) 30 tablet 11 Past Week   FLUoxetine (PROZAC) 20 MG capsule TAKE ONE (1) CAPSULE EACH DAY (Patient taking differently: Take 20 mg by mouth at bedtime.) 90 capsule 0 Past Week   fluticasone (FLONASE) 50 MCG/ACT nasal spray Place 2 sprays into both nostrils daily. (Patient taking differently: Place 2 sprays into both nostrils in the morning.) 16 g 6 11/22/2022   furosemide (LASIX) 20 MG tablet TAKE ONE (1) TABLET EACH DAY (Patient taking differently: Take 20 mg by mouth in the morning.) 90 tablet 3 11/22/2022   levocetirizine (XYZAL) 5 MG tablet Take 1 tablet (5 mg total) by mouth every evening. To replace Zyrtec (Patient taking differently: Take 5 mg by mouth at bedtime.) 90 tablet 0 Past Week   montelukast (SINGULAIR) 10 MG tablet Take 1 tablet (10 mg total) by mouth at bedtime. 30 tablet 3 Past Week   Multiple Vitamins-Minerals (ONE-A-DAY MENS 50+) TABS Take 1 tablet by mouth in the morning.   11/22/2022   spironolactone (ALDACTONE) 25 MG tablet TAKE ONE (1) TABLET EACH DAY (Patient taking differently: Take 25 mg by mouth in the morning.) 90 tablet 3 11/22/2022   benzonatate (TESSALON) 200 MG capsule Take 1 capsule (200 mg total) by mouth 3 (three) times daily as needed for cough. (Patient not taking: Reported on 11/23/2022) 30 capsule 1 Not Taking  brompheniramine-pseudoephedrine-DM 30-2-10 MG/5ML syrup Take 5 mLs by mouth 4 (four) times daily as needed. (Patient not taking: Reported on 11/23/2022) 120 mL 0 Not Taking   guaiFENesin (MUCINEX) 600 MG 12 hr tablet Take 1 tablet (600 mg total) by mouth 2 (two) times daily. (Patient not taking: Reported on 11/23/2022) 30 tablet 0 Not Taking   losartan-hydrochlorothiazide (HYZAAR) 100-25 MG tablet Take 1 tablet by mouth daily. (Patient taking differently: Take 1 tablet by mouth in the morning.) 90 tablet 3     Assessment: 56 year old male who was admitted with an incarcerated umbilical  hernia and is status post open umbilical hernia repair with mesh on 12/9. He has developed new onset afib and pharmacy asked to start lovenox.   Goal of Therapy:   Monitor platelets by anticoagulation protocol: Yes   Plan:  Enoxaparin 1mg /kg (130mg ) sq q12h Monitor for s/s of bleeding CBC q72hrs F/u transition to po tx  , BS Pharm D, BCPS Clinical Pharmacist 11/24/2022,1:16 PM

## 2022-11-24 NOTE — Progress Notes (Signed)
[facility] Progress Note  2 Days Post-Op  Subjective: Patient seen and examined.  He is resting comfortably in bed.  He was noted to be in A-fib with RVR this morning, so he is being transferred down to the ICU.  He had a little bit of nausea this morning, so we held off on eating breakfast.  He has had a couple bites of full liquids without nausea and vomiting.  He confirms continuing to pass flatus, and had a small liquid bowel movement yesterday.  He has some abdominal soreness around his incision site.  Objective: Vital signs in last 24 hours: Temp:  [97.8 F (36.6 C)-99.4 F (37.4 C)] 99.4 F (37.4 C) (12/11 0557) Pulse Rate:  [84-99] 87 (12/11 0557) Resp:  [17-18] 18 (12/11 0557) BP: (98-118)/(67-84) 98/67 (12/11 0557) SpO2:  [90 %-97 %] 96 % (12/11 0812) Weight:  [129.1 kg] 129.1 kg (12/10 1300) Last BM Date : 11/22/22  Intake/Output from previous day: 12/10 0701 - 12/11 0700 In: 360 [P.O.:360] Out: 1550 [Urine:1550] Intake/Output this shift: Total I/O In: 240 [P.O.:240] Out: -   General appearance: alert, cooperative, and no distress GI: Abdomen soft, mild distention, no percussion tenderness, mild periumbilical and incisional tenderness to palpation; no rigidity, guarding, rebound tenderness; incisions C/D/I with Dermabond in place  Lab Results:  Recent Labs    11/23/22 0346 11/24/22 0354  WBC 14.9* 10.2  HGB 14.8 14.6  HCT 45.0 44.3  PLT 359 322   BMET Recent Labs    11/22/22 0607 11/22/22 0635 11/23/22 0346  NA 139 141 137  K 3.7 3.9 3.7  CL 105 101 101  CO2 28  --  29  GLUCOSE 139* 139* 121*  BUN 21* 21* 17  CREATININE 1.03 1.10 1.06  CALCIUM 9.4  --  8.8*   PT/INR No results for input(s): "LABPROT", "INR" in the last 72 hours.  Studies/Results: No results found.  Anti-infectives: Anti-infectives (From admission, onward)    Start     Dose/Rate Route Frequency Ordered Stop   11/22/22 1400  ceFAZolin (ANCEF) IVPB 3g/100  mL premix        3 g 200 mL/hr over 30 Minutes Intravenous  Once 11/22/22 1202 11/22/22 1245   11/22/22 1208  ceFAZolin (ANCEF) 3-0.9 GM/100ML-% IVPB       Note to Pharmacy: Elvina Sidle S: cabinet override      11/22/22 1208 11/22/22 1310   11/22/22 1200  ceFAZolin (ANCEF) IVPB 2g/100 mL premix  Status:  Discontinued        2 g 200 mL/hr over 30 Minutes Intravenous Every 8 hours 11/22/22 1149 11/22/22 1202       Assessment/Plan:  Patient is a 56 year old male who was admitted with an incarcerated umbilical hernia and is status post open umbilical hernia repair with mesh on 12/9.   -Leukocytosis resolved, WBC 10.2 today -Diet advanced to soft, however patient may continue with full liquids until he feels comfortable taking in solid food -PRN pain control and antiemetics -Continue bowel regimen with Senokot, MiraLAX, and Dulcolax suppository -Monitor for bowel movements -Maintain abdominal binder -Okay to start a heparin drip from general surgery standpoint for new onset A-fib if felt to be warranted -Appreciate hospitalist recommendations   LOS: 2 days    Ayleah Hofmeister A Ansar Skoda 11/24/2022

## 2022-11-24 NOTE — Progress Notes (Addendum)
Progress Note   Patient: Kyle Burns IDP:[id number] DOB: Apr 08, 1966 DOA: 11/22/2022     2 DOS: the patient was seen and examined on 11/24/2022   Brief hospital admission course: Kyle Burns  is a 56 y.o. male, with past medical history of hypertension, asthma, GERD, hyperlipidemia, O SA/OHS, BiPAP dependent with oxygen at nighttime. -Patient presents to ED secondary to complaints of abdominal pain, developed yesterday, started having generalized abdominal pain, nausea, after eating [place] food, he denies any vomiting, he does report dry heaving as well, he denies any fever, chills, no dysuria or polyuria, he denies any obstipation or constipation, no diarrhea, no bright red blood per rectum, no chest pain, orts pain has worsened overnight which prompted him to come to ED -In ED patient was noted to have tender umbilical hernia, CT abdomen pelvis confirms incarcerated umbilical hernia, it was seen by general surgery with plan for surgical repair today, [facility] hospitalist consulted to admit.   Assessment and Plan: 1-incarcerated umbilical hernia/SBO -Status post surgical intervention -Appears to has tolerated procedure well. -Passing gas and tolerating clear liquids; no nausea or vomiting -Continue to maintain adequate hydration and follow electrolytes with further repletion as needed. -Diet advancements and postsurgical care as per general surgery. -Currently passing gas, expressed no bowel movements; very little intermittent nausea no vomiting.  2-hypertension stable currently -Continue to follow vital sign -Will continue to hold on antihypertensive agents until fully tolerating by mouth.  3-obstructive sleep apnea/obesity hypoventilation syndrome -Continue to wean off oxygen supplementation as tolerated -Continue BiPAP nightly -Patient denies shortness of breath sensation currently and is demonstrating good oxygen saturation.  4-hyperlipidemia -Planning to resume statin at  discharge. -Heart healthy diet discussed with patient.  5-gastroesophageal flux disease -Continue PPI.  6-history of asthma -Appears to be mild and intermittent -Continue as needed bronchodilator management. -Continue the use of Singulair. -No wheezing on exam.  7-class III obesity -Body mass index is 39.7 kg/m. -Low-calorie diet, portion control and increase physical activity discussed with patient.  8-incidental 4 mm right lung nodule -Outpatient follow-up with pulmonary service recommended.  9-A-fib with RVR -Patient has experience postop surgical atrial fibrillation with RVR -Metoprolol x 1 dose given without resolution of arrhythmia. -Patient transferred to stepdown for Cardizem drip, 2D echo evaluation and initiation of full dose lovenox. -Follow clinical response.  Subjective:  No fever, no chest pain, no significant abdominal discomfort and expressing no shortness of breath currently.  Patient passing gas but has not achieved bowel movement.  Expressed intermittent nausea and has been found with A-fib with RVR.  Physical Exam: Vitals:   11/23/22 2124 11/24/22 0101 11/24/22 0557 11/24/22 0812  BP: 118/84 113/83 98/67   Pulse: 99 90 87   Resp: 17  18   Temp: 97.8 F (36.6 C) 99.1 F (37.3 C) 99.4 F (37.4 C)   TempSrc: Oral Oral Oral   SpO2: 92% 96% 93% 96%  Weight:      Height:       General exam: Alert, awake, oriented x 3; reports feelings of fluttering in his heart; no chest pain and no shortness of breath.  Passing gas but no bowel movement.  Reports intermittent nausea.  No vomiting Respiratory system: Clear to auscultation. Respiratory effort normal.  No using accessory muscles.  Good saturation on room air. Cardiovascular system: Irregular, no rubs, no gallops, unable to properly assess JVD with body habitus. Gastrointestinal system: Abdomen is mildly distended, expected tender around incision; positive bowel sounds, no guarding.   Central nervous  system:  Alert and oriented. No focal neurological deficits. Extremities: No cyanosis or clubbing. Skin: No petechiae. Psychiatry: Judgement and insight appear normal. Mood & affect appropriate.   Data Reviewed: Latest basic metabolic panel: Sodium 0000000, potassium 3.7, chloride 101, bicarb 29, BUN 17, creatinine 1.06 CBC: WBCs 10.2, hemoglobin 14.6 and platelet count 322 K.  Family Communication: No family at bedside.  Disposition: Status is: Inpatient Remains inpatient appropriate because: Status post surgical intervention due to incarcerated hernia; waiting for resubmission bowel function.  Continue supportive care and IV analgesics.  New development of A-fib with RVR; will follow echo, Cardizem drip and also full dose lovenox initiated.   Planned Discharge Destination: Home  Time spent: 35 minutes  Author: Barton Dubois, MD 11/24/2022 9:54 AM  For on call review [email or web address].

## 2022-11-25 ENCOUNTER — Inpatient Hospital Stay (HOSPITAL_COMMUNITY): Payer: No Typology Code available for payment source

## 2022-11-25 DIAGNOSIS — Z6841 Body Mass Index (BMI) 40.0 and over, adult: Secondary | ICD-10-CM | POA: Diagnosis not present

## 2022-11-25 DIAGNOSIS — K219 Gastro-esophageal reflux disease without esophagitis: Secondary | ICD-10-CM | POA: Diagnosis not present

## 2022-11-25 DIAGNOSIS — K420 Umbilical hernia with obstruction, without gangrene: Secondary | ICD-10-CM | POA: Diagnosis not present

## 2022-11-25 DIAGNOSIS — G4733 Obstructive sleep apnea (adult) (pediatric): Secondary | ICD-10-CM | POA: Diagnosis not present

## 2022-11-25 DIAGNOSIS — I4891 Unspecified atrial fibrillation: Secondary | ICD-10-CM | POA: Diagnosis not present

## 2022-11-25 LAB — BASIC METABOLIC PANEL
Anion gap: 13 (ref 5–15)
BUN: 38 mg/dL — ABNORMAL HIGH (ref 6–20)
CO2: 26 mmol/L (ref 22–32)
Calcium: 9.5 mg/dL (ref 8.9–10.3)
Chloride: 100 mmol/L (ref 98–111)
Creatinine, Ser: 1.58 mg/dL — ABNORMAL HIGH (ref 0.61–1.24)
GFR, Estimated: 50 mL/min — ABNORMAL LOW (ref >60)
Glucose, Bld: 162 mg/dL — ABNORMAL HIGH (ref 70–99)
Potassium: 3.5 mmol/L (ref 3.5–5.1)
Sodium: 139 mmol/L (ref 135–145)

## 2022-11-25 LAB — ECHOCARDIOGRAM COMPLETE
AR max vel: 2.72 cm2
AV Area VTI: 2.57 cm2
AV Area mean vel: 3.18 cm2
AV Mean grad: 4 mmHg
AV Peak grad: 9 mmHg
Ao pk vel: 1.5 m/s
Area-P 1/2: 3.19 cm2
Height: 71 in
MV VTI: 3.15 cm2
S' Lateral: 2.4 cm
Weight: 4526.4 oz

## 2022-11-25 LAB — CBC
HCT: 48.6 % (ref 39.0–52.0)
Hemoglobin: 15.9 g/dL (ref 13.0–17.0)
MCH: 28.8 pg (ref 26.0–34.0)
MCHC: 32.7 g/dL (ref 30.0–36.0)
MCV: 88 fL (ref 80.0–100.0)
Platelets: 398 10*3/uL (ref 150–400)
RBC: 5.52 MIL/uL (ref 4.22–5.81)
RDW: 13.2 % (ref 11.5–15.5)
WBC: 14.3 10*3/uL — ABNORMAL HIGH (ref 4.0–10.5)
nRBC: 0 % (ref 0.0–0.2)

## 2022-11-25 LAB — MAGNESIUM: Magnesium: 2.9 mg/dL — ABNORMAL HIGH (ref 1.7–2.4)

## 2022-11-25 LAB — SURGICAL PATHOLOGY

## 2022-11-25 MED ORDER — MAGNESIUM CITRATE PO SOLN
1.0000 | Freq: Once | ORAL | Status: AC
  Administered 2022-11-25: 1 via ORAL
  Filled 2022-11-25: qty 296

## 2022-11-25 MED ORDER — SODIUM CHLORIDE 0.9 % IV SOLN: INTRAVENOUS | Status: AC

## 2022-11-25 MED ORDER — AMIODARONE HCL IN DEXTROSE 360-4.14 MG/200ML-% IV SOLN
60.0000 mg/h | INTRAVENOUS | Status: DC
  Administered 2022-11-26: 60 mg/h via INTRAVENOUS
  Filled 2022-11-25: qty 200

## 2022-11-25 MED ORDER — LEVALBUTEROL HCL 0.63 MG/3ML IN NEBU
0.6300 mg | INHALATION_SOLUTION | Freq: Four times a day (QID) | RESPIRATORY_TRACT | Status: DC | PRN
  Administered 2022-11-29: 0.63 mg via RESPIRATORY_TRACT
  Filled 2022-11-25: qty 3

## 2022-11-25 MED ORDER — AMIODARONE LOAD VIA INFUSION
150.0000 mg | Freq: Once | INTRAVENOUS | Status: AC
  Administered 2022-11-25: 150 mg via INTRAVENOUS
  Filled 2022-11-25: qty 83.34

## 2022-11-25 MED ORDER — AMIODARONE HCL IN DEXTROSE 360-4.14 MG/200ML-% IV SOLN
30.0000 mg/h | INTRAVENOUS | Status: DC
  Administered 2022-11-26 – 2022-11-27 (×6): 30 mg/h via INTRAVENOUS
  Filled 2022-11-25 (×5): qty 200

## 2022-11-25 MED ORDER — PERFLUTREN LIPID MICROSPHERE
1.0000 mL | INTRAVENOUS | Status: AC | PRN
  Administered 2022-11-25: 6 mL via INTRAVENOUS

## 2022-11-25 MED ORDER — FLUTICASONE PROPIONATE 50 MCG/ACT NA SUSP
1.0000 | Freq: Every day | NASAL | Status: DC
  Administered 2022-11-25 – 2022-12-07 (×12): 1 via NASAL
  Filled 2022-11-25 (×2): qty 16

## 2022-11-25 NOTE — Progress Notes (Signed)
[facility] Progress Note  3 Days Post-Op  Subjective: Patient seen and examined.  He is resting comfortably in bed.  He has some abdominal pain at his incision site, and feels a little bloated.  He is passing gas but denies any bowel movements.  He had 1 episode of emesis yesterday evening after drinking some water.  He denies any nausea or emesis.  Objective: Vital signs in last 24 hours: Temp:  [97.9 F (36.6 C)-99.2 F (37.3 C)] 97.9 F (36.6 C) (12/12 0720) Resp:  [18] 18 (12/11 1415) BP: (105-149)/(66-99) 115/99 (12/12 0930) SpO2:  [92 %-94 %] 94 % (12/12 0930) Weight:  [128.3 kg] 128.3 kg (12/11 1238) Last BM Date : 11/24/22  Intake/Output from previous day: 12/11 0701 - 12/12 0700 In: 565.3 [P.O.:240; I.V.:275.3; IV Piggyback:50] Out: 600 [Urine:500; Emesis/NG output:100] Intake/Output this shift: Total I/O In: -  Out: 200 [Urine:200]  General appearance: alert, cooperative, and no distress GI: Abdomen soft, mild distention, no percussion tenderness, mild periumbilical and incisional tenderness to palpation; no rigidity, guarding, rebound tenderness; incisions C/D/I with Dermabond in place  Lab Results:  Recent Labs    11/24/22 0354 11/25/22 0422  WBC 10.2 14.3*  HGB 14.6 15.9  HCT 44.3 48.6  PLT 322 398   BMET Recent Labs    11/23/22 0346 11/25/22 0422  NA 137 139  K 3.7 3.5  CL 101 100  CO2 29 26  GLUCOSE 121* 162*  BUN 17 38*  CREATININE 1.06 1.58*  CALCIUM 8.8* 9.5   PT/INR No results for input(s): "LABPROT", "INR" in the last 72 hours.  Studies/Results: No results found.  Anti-infectives: Anti-infectives (From admission, onward)    Start     Dose/Rate Route Frequency Ordered Stop   11/22/22 1400  ceFAZolin (ANCEF) IVPB 3g/100 mL premix        3 g 200 mL/hr over 30 Minutes Intravenous  Once 11/22/22 1202 11/22/22 1245   11/22/22 1208  ceFAZolin (ANCEF) 3-0.9 GM/100ML-% IVPB       Note to Pharmacy: Elvina Sidle S:  cabinet override      11/22/22 1208 11/22/22 1310   11/22/22 1200  ceFAZolin (ANCEF) IVPB 2g/100 mL premix  Status:  Discontinued        2 g 200 mL/hr over 30 Minutes Intravenous Every 8 hours 11/22/22 1149 11/22/22 1202       Assessment/Plan:  Patient is a 56 year old male who was admitted with an incarcerated umbilical hernia and is status post open umbilical hernia repair with mesh on 12/9.   -Leukocytosis of 14.3 today, continue to monitor though I think this is more related to his heart issues -Decrease diet down to full liquids given episode of emesis yesterday -PRN pain control and antiemetics -Ordered magnesium citrate -Continue bowel regimen with Senokot, MiraLAX, and Dulcolax suppository -Monitor for bowel movements -Maintain abdominal binder -Lovenox started given new onset A-fib -Appreciate hospitalist recommendations   LOS: 3 days    Malaya Cagley A Zakkery Dorian 11/25/2022

## 2022-11-25 NOTE — Progress Notes (Signed)
[facility]  Ok to use Ng as it goes below diaphragm.  Algis Greenhouse, MD

## 2022-11-25 NOTE — Progress Notes (Signed)
*  PRELIMINARY RESULTS* Echocardiogram 2D Echocardiogram has been performed.  Kyle Burns 11/25/2022, 10:57 AM

## 2022-11-25 NOTE — Progress Notes (Signed)
Progress Note   Patient: Kyle Burns [id number]:[id number] DOB: 26-Feb-1966 DOA: 11/22/2022     3 DOS: the patient was seen and examined on 11/25/2022   Brief hospital admission course: Jrake Rodriquez  is a 56 y.o. male, with past medical history of hypertension, asthma, GERD, hyperlipidemia, O SA/OHS, BiPAP dependent with oxygen at nighttime. -Patient presents to ED secondary to complaints of abdominal pain, developed yesterday, started having generalized abdominal pain, nausea, after eating [place] food, he denies any vomiting, he does report dry heaving as well, he denies any fever, chills, no dysuria or polyuria, he denies any obstipation or constipation, no diarrhea, no bright red blood per rectum, no chest pain, orts pain has worsened overnight which prompted him to come to ED -In ED patient was noted to have tender umbilical hernia, CT abdomen pelvis confirms incarcerated umbilical hernia, it was seen by general surgery with plan for surgical repair today, [facility]ist consulted to admit.   Assessment and Plan: 1-incarcerated umbilical hernia/SBO -Status post surgical intervention -Appears to has tolerated procedure well. -Passing gas and tolerating clear liquids; no nausea or vomiting -Continue to maintain adequate hydration and follow electrolytes with further repletion as needed. -Currently passing gas, expressed no bowel movements overnight or today; reports having no nausea, but expressed episode of vomiting earlier this morning. -Diet has been downgraded to full liquids. -Continue to follow general surgery recommendations from a postoperative standpoint. -Holding on NG tube at this point.  2-hypertension stable currently -Continue to follow vital sign -Will continue to hold on antihypertensive agents until fully tolerating by mouth. -Continue to maintain adequate hydration.  3-obstructive sleep apnea/obesity hypoventilation syndrome -Continue to wean off oxygen  supplementation as tolerated -Continue BiPAP nightly -Patient denies shortness of breath sensation currently and is demonstrating good oxygen saturation.  4-hyperlipidemia -Planning to resume statin at discharge. -Heart healthy diet discussed with patient.  5-gastroesophageal flux disease -Continue PPI.  6-history of asthma -Appears to be mild and intermittent -Continue as needed bronchodilator management. -Continue the use of Singulair. -No wheezing on exam.  7-class III obesity -Body mass index is 39.46 kg/m. -Low-calorie diet, portion control and increase physical activity discussed with patient.  8-incidental 4 mm right lung nodule -Outpatient follow-up with pulmonary service recommended.  9-A-fib with RVR -Patient has experience postop surgical atrial fibrillation with RVR -Rate control and has currently transition back to sinus rhythm -Patient has been On Cardizem and using Lovenox -Follow-up 2D echo -Follow clinical response.  10-acute kidney injury -Most likely in the setting of impaired perfusion during transit arrhythmia and also compounding of prerenal azotemia while not being able to tolerate adequate hydration orally. -Started on IV fluids -Minimize nephrotoxic agents and avoid hypotension. -Recheck basic metabolic panel in AM.  Subjective:  No fever, no chest pain, having some abdominal pain around incisional area; no bowel movement but passing gas.  Reports intermittent nausea and one episode of vomiting this morning.  Physical Exam: Vitals:   11/25/22 0720 11/25/22 0900 11/25/22 0930 11/25/22 1030  BP:  (!) 149/97 (!) 115/99 132/88  Pulse:      Resp:      Temp: 97.9 F (36.6 C)     TempSrc: Oral     SpO2: 94% 92% 94% 95%  Weight:      Height:       General exam: Alert, awake, oriented x 3; no chest pain, reported some nausea, abdominal pain around incision and 1 episode of vomiting.  Passing gas but no bowel movements today reported.  Respiratory  system: Good saturation on room air; no using accessory muscles. Cardiovascular system:RRR.  No rub, no gallops, no JVD. Gastrointestinal system: Abdomen is mildly distended, soft, tender around incision; positive bowel sounds.   Central nervous system: Alert and oriented. No focal neurological deficits. Extremities: No cyanosis, clubbing or edema. Skin: No petechiae. Psychiatry: Judgement and insight appear normal. Mood & affect appropriate.   Data Reviewed: Basic metabolic panel: Sodium 139, potassium 3.5, chloride 100, bicarb 26, BUN 38, creatinine 1.58, GFR 50. -CBC: White blood cell 14.3, hemoglobin 15.9 and platelet count 398 Magnesium: 2.9  Family Communication: No family at bedside.  Disposition: Status is: Inpatient Remains inpatient appropriate because: Status post surgical intervention due to incarcerated hernia; waiting for resubmission bowel function.  Continue supportive care and IV analgesics.  New development of A-fib with RVR; that require treatment with Cardizem drip and Lovenox injections.  2D echo pending.   Planned Discharge Destination: Home  Time spent: 50 minutes  Author: Vassie Loll, MD 11/25/2022 10:45 AM  For on call review [email or web address].

## 2022-11-25 NOTE — Progress Notes (Signed)
Pt not on BIPAP machine tonight, patient has ng tube placed

## 2022-11-26 ENCOUNTER — Inpatient Hospital Stay (HOSPITAL_COMMUNITY): Payer: No Typology Code available for payment source

## 2022-11-26 ENCOUNTER — Encounter (HOSPITAL_COMMUNITY): Payer: Self-pay | Admitting: Internal Medicine

## 2022-11-26 DIAGNOSIS — K56609 Unspecified intestinal obstruction, unspecified as to partial versus complete obstruction: Secondary | ICD-10-CM | POA: Diagnosis not present

## 2022-11-26 DIAGNOSIS — A419 Sepsis, unspecified organism: Secondary | ICD-10-CM | POA: Diagnosis not present

## 2022-11-26 DIAGNOSIS — K420 Umbilical hernia with obstruction, without gangrene: Secondary | ICD-10-CM | POA: Diagnosis not present

## 2022-11-26 DIAGNOSIS — R6521 Severe sepsis with septic shock: Secondary | ICD-10-CM

## 2022-11-26 DIAGNOSIS — I4891 Unspecified atrial fibrillation: Secondary | ICD-10-CM

## 2022-11-26 DIAGNOSIS — R112 Nausea with vomiting, unspecified: Secondary | ICD-10-CM

## 2022-11-26 DIAGNOSIS — J9601 Acute respiratory failure with hypoxia: Secondary | ICD-10-CM

## 2022-11-26 DIAGNOSIS — I480 Paroxysmal atrial fibrillation: Secondary | ICD-10-CM | POA: Diagnosis not present

## 2022-11-26 DIAGNOSIS — N179 Acute kidney failure, unspecified: Secondary | ICD-10-CM

## 2022-11-26 DIAGNOSIS — I10 Essential (primary) hypertension: Secondary | ICD-10-CM

## 2022-11-26 HISTORY — DX: Acute respiratory failure with hypoxia: J96.01

## 2022-11-26 HISTORY — DX: Unspecified intestinal obstruction, unspecified as to partial versus complete obstruction: K56.609

## 2022-11-26 HISTORY — DX: Unspecified atrial fibrillation: I48.91

## 2022-11-26 LAB — BLOOD GAS, ARTERIAL
Acid-Base Excess: 7.8 mmol/L — ABNORMAL HIGH (ref 0.0–2.0)
Acid-Base Excess: 5.2 mmol/L — ABNORMAL HIGH (ref 0.0–2.0)
Bicarbonate: 32 mmol/L — ABNORMAL HIGH (ref 20.0–28.0)
Bicarbonate: 29.9 mmol/L — ABNORMAL HIGH (ref 20.0–28.0)
Drawn by: 131
Drawn by: 22766
FIO2: 36 %
FIO2: 100 %
O2 Saturation: 93.1 %
O2 Saturation: 97.6 %
Patient temperature: 37
Patient temperature: 37
pCO2 arterial: 42 mmHg (ref 32–48)
pCO2 arterial: 43 mmHg (ref 32–48)
pH, Arterial: 7.49 — ABNORMAL HIGH (ref 7.35–7.45)
pH, Arterial: 7.45 (ref 7.35–7.45)
pO2, Arterial: 70 mmHg — ABNORMAL LOW (ref 83–108)
pO2, Arterial: 103 mmHg (ref 83–108)

## 2022-11-26 LAB — BASIC METABOLIC PANEL
Anion gap: 15 (ref 5–15)
BUN: 64 mg/dL — ABNORMAL HIGH (ref 6–20)
CO2: 28 mmol/L (ref 22–32)
Calcium: 8.9 mg/dL (ref 8.9–10.3)
Chloride: 94 mmol/L — ABNORMAL LOW (ref 98–111)
Creatinine, Ser: 2.66 mg/dL — ABNORMAL HIGH (ref 0.61–1.24)
GFR, Estimated: 27 mL/min — ABNORMAL LOW (ref >60)
Glucose, Bld: 167 mg/dL — ABNORMAL HIGH (ref 70–99)
Potassium: 3 mmol/L — ABNORMAL LOW (ref 3.5–5.1)
Sodium: 137 mmol/L (ref 135–145)

## 2022-11-26 LAB — CBC WITH DIFFERENTIAL/PLATELET
Abs Immature Granulocytes: 0.06 10*3/uL (ref 0.00–0.07)
Basophils Absolute: 0.1 10*3/uL (ref 0.0–0.1)
Basophils Relative: 1 %
Eosinophils Absolute: 0.1 10*3/uL (ref 0.0–0.5)
Eosinophils Relative: 0 %
HCT: 44 % (ref 39.0–52.0)
Hemoglobin: 14.8 g/dL (ref 13.0–17.0)
Immature Granulocytes: 0 %
Lymphocytes Relative: 8 %
Lymphs Abs: 1.2 10*3/uL (ref 0.7–4.0)
MCH: 29 pg (ref 26.0–34.0)
MCHC: 33.6 g/dL (ref 30.0–36.0)
MCV: 86.3 fL (ref 80.0–100.0)
Monocytes Absolute: 2.9 10*3/uL — ABNORMAL HIGH (ref 0.1–1.0)
Monocytes Relative: 20 %
Neutro Abs: 10.3 10*3/uL — ABNORMAL HIGH (ref 1.7–7.7)
Neutrophils Relative %: 71 %
Platelets: 409 10*3/uL — ABNORMAL HIGH (ref 150–400)
RBC: 5.1 MIL/uL (ref 4.22–5.81)
RDW: 13.2 % (ref 11.5–15.5)
WBC: 14.6 10*3/uL — ABNORMAL HIGH (ref 4.0–10.5)
nRBC: 0 % (ref 0.0–0.2)

## 2022-11-26 LAB — GLUCOSE, CAPILLARY
Glucose-Capillary: 198 mg/dL — ABNORMAL HIGH (ref 70–99)
Glucose-Capillary: 175 mg/dL — ABNORMAL HIGH (ref 70–99)
Glucose-Capillary: 156 mg/dL — ABNORMAL HIGH (ref 70–99)
Glucose-Capillary: 133 mg/dL — ABNORMAL HIGH (ref 70–99)

## 2022-11-26 LAB — MAGNESIUM: Magnesium: 3.7 mg/dL — ABNORMAL HIGH (ref 1.7–2.4)

## 2022-11-26 MED ORDER — MIDAZOLAM HCL 2 MG/2ML IJ SOLN
1.0000 mg | INTRAMUSCULAR | Status: DC | PRN
  Administered 2022-11-26 – 2022-12-01 (×9): 2 mg via INTRAVENOUS
  Administered 2022-12-01: 1 mg via INTRAVENOUS
  Administered 2022-12-01 – 2022-12-02 (×4): 2 mg via INTRAVENOUS
  Filled 2022-11-26 (×13): qty 2

## 2022-11-26 MED ORDER — NOREPINEPHRINE 4 MG/250ML-% IV SOLN
2.0000 ug/min | INTRAVENOUS | Status: DC
  Administered 2022-11-26: 7 ug/min via INTRAVENOUS
  Administered 2022-11-26: 2 ug/min via INTRAVENOUS
  Administered 2022-11-27 (×2): 8 ug/min via INTRAVENOUS
  Administered 2022-11-28: 5 ug/min via INTRAVENOUS
  Filled 2022-11-26 (×5): qty 250

## 2022-11-26 MED ORDER — MORPHINE SULFATE (PF) 2 MG/ML IV SOLN
INTRAVENOUS | Status: AC
  Filled 2022-11-26: qty 1

## 2022-11-26 MED ORDER — MONTELUKAST SODIUM 10 MG PO TABS
10.0000 mg | ORAL_TABLET | Freq: Every day | ORAL | Status: DC
  Administered 2022-11-26 – 2022-12-03 (×3): 10 mg
  Filled 2022-11-26 (×3): qty 1

## 2022-11-26 MED ORDER — ENOXAPARIN SODIUM 80 MG/0.8ML IJ SOSY
0.5000 mg/kg | PREFILLED_SYRINGE | INTRAMUSCULAR | Status: DC
  Administered 2022-11-27: 62.5 mg via SUBCUTANEOUS
  Filled 2022-11-26: qty 0.8

## 2022-11-26 MED ORDER — FENTANYL BOLUS VIA INFUSION
50.0000 ug | INTRAVENOUS | Status: DC | PRN
  Administered 2022-11-26 – 2022-11-29 (×6): 50 ug via INTRAVENOUS
  Administered 2022-11-30: 25 ug via INTRAVENOUS
  Administered 2022-12-01: 50 ug via INTRAVENOUS
  Administered 2022-12-01: 25 ug via INTRAVENOUS
  Administered 2022-12-01 – 2022-12-03 (×3): 50 ug via INTRAVENOUS

## 2022-11-26 MED ORDER — PROPOFOL 1000 MG/100ML IV EMUL
0.0000 ug/kg/min | INTRAVENOUS | Status: DC
  Administered 2022-11-26: 35 ug/kg/min via INTRAVENOUS
  Administered 2022-11-26: 25 ug/kg/min via INTRAVENOUS
  Administered 2022-11-26: 35 ug/kg/min via INTRAVENOUS
  Administered 2022-11-26: 5 ug/kg/min via INTRAVENOUS
  Administered 2022-11-27: 30 ug/kg/min via INTRAVENOUS
  Administered 2022-11-27: 35 ug/kg/min via INTRAVENOUS
  Administered 2022-11-27: 40 ug/kg/min via INTRAVENOUS
  Administered 2022-11-27 (×2): 30 ug/kg/min via INTRAVENOUS
  Administered 2022-11-27: 35 ug/kg/min via INTRAVENOUS
  Administered 2022-11-27 – 2022-11-28 (×2): 40 ug/kg/min via INTRAVENOUS
  Administered 2022-11-28: 45 ug/kg/min via INTRAVENOUS
  Administered 2022-11-28: 30 ug/kg/min via INTRAVENOUS
  Administered 2022-11-28: 45 ug/kg/min via INTRAVENOUS
  Administered 2022-11-28: 50 ug/kg/min via INTRAVENOUS
  Administered 2022-11-28: 40 ug/kg/min via INTRAVENOUS
  Administered 2022-11-28: 50 ug/kg/min via INTRAVENOUS
  Administered 2022-11-29: 40 ug/kg/min via INTRAVENOUS
  Administered 2022-11-29: 15 ug/kg/min via INTRAVENOUS
  Administered 2022-11-29: 20 ug/kg/min via INTRAVENOUS
  Administered 2022-11-29 (×2): 40 ug/kg/min via INTRAVENOUS
  Administered 2022-11-29: 15 ug/kg/min via INTRAVENOUS
  Administered 2022-11-30: 25.06 ug/kg/min via INTRAVENOUS
  Administered 2022-11-30: 15 ug/kg/min via INTRAVENOUS
  Administered 2022-11-30: 30 ug/kg/min via INTRAVENOUS
  Administered 2022-12-01: 45 ug/kg/min via INTRAVENOUS
  Administered 2022-12-01: 40 ug/kg/min via INTRAVENOUS
  Administered 2022-12-01: 25 ug/kg/min via INTRAVENOUS
  Administered 2022-12-01 (×2): 45 ug/kg/min via INTRAVENOUS
  Administered 2022-12-02: 35 ug/kg/min via INTRAVENOUS
  Administered 2022-12-02 (×3): 25 ug/kg/min via INTRAVENOUS
  Administered 2022-12-03: 30 ug/kg/min via INTRAVENOUS
  Administered 2022-12-03: 25 ug/kg/min via INTRAVENOUS
  Administered 2022-12-03: 30 ug/kg/min via INTRAVENOUS
  Filled 2022-11-26 (×40): qty 100

## 2022-11-26 MED ORDER — ETOMIDATE 2 MG/ML IV SOLN
20.0000 mg | Freq: Once | INTRAVENOUS | Status: AC
  Administered 2022-11-26: 20 mg via INTRAVENOUS

## 2022-11-26 MED ORDER — ETOMIDATE 2 MG/ML IV SOLN
INTRAVENOUS | Status: AC
  Filled 2022-11-26: qty 20

## 2022-11-26 MED ORDER — DIGOXIN 0.25 MG/ML IJ SOLN
0.2500 mg | Freq: Once | INTRAMUSCULAR | Status: AC
  Administered 2022-11-26: 0.25 mg via INTRAVENOUS
  Filled 2022-11-26: qty 2

## 2022-11-26 MED ORDER — MIDAZOLAM HCL 2 MG/2ML IJ SOLN
INTRAMUSCULAR | Status: AC
  Filled 2022-11-26: qty 2

## 2022-11-26 MED ORDER — POLYETHYLENE GLYCOL 3350 17 G PO PACK
17.0000 g | PACK | Freq: Every day | ORAL | Status: DC
  Administered 2022-11-26: 17 g
  Filled 2022-11-26: qty 1

## 2022-11-26 MED ORDER — ORAL CARE MOUTH RINSE: 15.0000 mL | OROMUCOSAL | Status: DC | PRN

## 2022-11-26 MED ORDER — DOCUSATE SODIUM 50 MG/5ML PO LIQD
100.0000 mg | Freq: Two times a day (BID) | ORAL | Status: DC
  Administered 2022-11-26 – 2022-12-04 (×11): 100 mg
  Filled 2022-11-26 (×12): qty 10

## 2022-11-26 MED ORDER — ROCURONIUM BROMIDE 10 MG/ML (PF) SYRINGE
PREFILLED_SYRINGE | INTRAVENOUS | Status: AC
  Filled 2022-11-26: qty 10

## 2022-11-26 MED ORDER — FENTANYL CITRATE PF 50 MCG/ML IJ SOSY
50.0000 ug | PREFILLED_SYRINGE | Freq: Once | INTRAMUSCULAR | Status: AC
  Administered 2022-11-26: 50 ug via INTRAVENOUS
  Filled 2022-11-26: qty 1

## 2022-11-26 MED ORDER — SENNOSIDES-DOCUSATE SODIUM 8.6-50 MG PO TABS
2.0000 | ORAL_TABLET | Freq: Two times a day (BID) | ORAL | Status: DC
  Administered 2022-11-26 (×2): 2
  Filled 2022-11-26: qty 2

## 2022-11-26 MED ORDER — KETAMINE HCL 50 MG/5ML IJ SOSY
PREFILLED_SYRINGE | INTRAMUSCULAR | Status: AC
  Filled 2022-11-26: qty 10

## 2022-11-26 MED ORDER — POTASSIUM CHLORIDE 10 MEQ/100ML IV SOLN
10.0000 meq | INTRAVENOUS | Status: AC
  Administered 2022-11-26 (×4): 10 meq via INTRAVENOUS
  Filled 2022-11-26 (×4): qty 100

## 2022-11-26 MED ORDER — ACETAMINOPHEN 10 MG/ML IV SOLN
1000.0000 mg | Freq: Four times a day (QID) | INTRAVENOUS | Status: AC
  Administered 2022-11-26 – 2022-11-27 (×4): 1000 mg via INTRAVENOUS
  Filled 2022-11-26 (×4): qty 100

## 2022-11-26 MED ORDER — SUCCINYLCHOLINE CHLORIDE 200 MG/10ML IV SOSY
PREFILLED_SYRINGE | INTRAVENOUS | Status: AC
  Filled 2022-11-26: qty 10

## 2022-11-26 MED ORDER — SUCCINYLCHOLINE CHLORIDE 200 MG/10ML IV SOSY
120.0000 mg | PREFILLED_SYRINGE | Freq: Once | INTRAVENOUS | Status: AC
  Administered 2022-11-26: 120 mg via INTRAVENOUS

## 2022-11-26 MED ORDER — ACETAMINOPHEN 325 MG PO TABS: 650.0000 mg | ORAL_TABLET | Freq: Four times a day (QID) | ORAL | Status: DC | PRN

## 2022-11-26 MED ORDER — PHENOL 1.4 % MT LIQD
1.0000 | OROMUCOSAL | Status: DC | PRN
  Administered 2022-11-26: 1 via OROMUCOSAL
  Filled 2022-11-26: qty 177

## 2022-11-26 MED ORDER — POTASSIUM CHLORIDE 10 MEQ/50ML IV SOLN: 10.0000 meq | INTRAVENOUS | Status: DC

## 2022-11-26 MED ORDER — FUROSEMIDE 10 MG/ML IJ SOLN
40.0000 mg | Freq: Once | INTRAMUSCULAR | Status: AC
  Administered 2022-11-26: 40 mg via INTRAVENOUS
  Filled 2022-11-26: qty 4

## 2022-11-26 MED ORDER — FENTANYL CITRATE (PF) 100 MCG/2ML IJ SOLN
INTRAMUSCULAR | Status: AC
  Filled 2022-11-26: qty 2

## 2022-11-26 MED ORDER — SODIUM CHLORIDE 0.9 % IV SOLN
250.0000 mL | INTRAVENOUS | Status: DC
  Administered 2022-12-02: 250 mL via INTRAVENOUS

## 2022-11-26 MED ORDER — ORAL CARE MOUTH RINSE
15.0000 mL | OROMUCOSAL | Status: DC
  Administered 2022-11-26 – 2022-12-03 (×82): 15 mL via OROMUCOSAL

## 2022-11-26 MED ORDER — FENTANYL CITRATE PF 50 MCG/ML IJ SOSY
PREFILLED_SYRINGE | INTRAMUSCULAR | Status: AC
  Filled 2022-11-26: qty 2

## 2022-11-26 MED ORDER — MORPHINE SULFATE (PF) 2 MG/ML IV SOLN: 2.0000 mg | Freq: Once | INTRAVENOUS | Status: DC

## 2022-11-26 MED ORDER — LACTATED RINGERS IV SOLN: INTRAVENOUS | Status: DC

## 2022-11-26 MED ORDER — FENTANYL 2500MCG IN NS 250ML (10MCG/ML) PREMIX INFUSION
0.0000 ug/h | INTRAVENOUS | Status: DC
  Administered 2022-11-26 – 2022-11-28 (×2): 50 ug/h via INTRAVENOUS
  Administered 2022-11-30: 25 ug/h via INTRAVENOUS
  Administered 2022-12-02: 15 ug/h via INTRAVENOUS
  Filled 2022-11-26 (×4): qty 250

## 2022-11-26 NOTE — ED Provider Notes (Signed)
I was called to the ICU to intubate the patient Kyle Burns.  He was having respiratory difficulties and was severely tachycardic.  He was intubated on the second try without trouble using the glide scope.  The tube was witnessed on physical exam and on auscultation he had breath sounds bilaterally with CO2 color change to yellow.  Patient tolerated procedure well.  Procedure Name: Intubation Date/Time: 11/26/2022 8:14 AM  Performed by: Bethann Berkshire, MDPre-anesthesia Checklist: Patient identified Oxygen Delivery Method: Ambu bag Preoxygenation: Pre-oxygenation with 100% oxygen Induction Type: Rapid sequence Ventilation: Mask ventilation without difficulty Laryngoscope Size: Glidescope Grade View: Grade II Tube type: Non-subglottic suction tube Tube size: 7.5 mm Number of attempts: 2 Airway Equipment and Method: Rigid stylet Placement Confirmation: CO2 detector, Breath sounds checked- equal and bilateral and ETT inserted through vocal cords under direct vision Difficulty Due To: Difficulty was anticipated Comments: Patient was admitted on the second attempt after changing from a 3 blade to a 4 blade        Bethann Berkshire, MD 11/26/22 [phone]

## 2022-11-26 NOTE — Progress Notes (Signed)
0756 20mg  etomidate  0756 120mg  succinylcholine  0759 intubated 8 ET 25@lip  + color change

## 2022-11-26 NOTE — Progress Notes (Addendum)
PROGRESS NOTE    Kyle Burns  WPY:[id number] DOB: [birth year] DOA: 11/22/2022 PCP: Raliegh Ip, DO   Brief Narrative:    Kyle Burns  is a 56 y.o. male, with past medical history of hypertension, asthma, GERD, hyperlipidemia, O SA/OHS, BiPAP dependent with oxygen at nighttime. -Patient presents to ED secondary to complaints of abdominal pain, developed yesterday, started having generalized abdominal pain, nausea, after eating [place] food, he denies any vomiting, he does report dry heaving as well, he denies any fever, chills, no dysuria or polyuria, he denies any obstipation or constipation, no diarrhea, no bright red blood per rectum, no chest pain, orts pain has worsened overnight which prompted him to come to ED -Patient underwent open umbilical hernia repair with placement of mesh on 12/9.  He is noted to have worsening abdominal distention suspected to be related to ileus and now has worsening respiratory distress and hypoxemia requiring intubation on 12/13.  He also developed atrial fibrillation with RVR and is on amiodarone infusion.  Assessment & Plan:   Principal Problem:   Incarcerated umbilical hernia Active Problems:   Hyperlipidemia   Obstructive sleep apnea   Gastroesophageal reflux disease   BMI 40.0-44.9, adult (HCC)   Mild intermittent asthma   Nodule of right lung   Acute respiratory failure with hypoxia (HCC)   Atrial fibrillation with rapid ventricular response (HCC)   Small bowel obstruction (HCC)  Assessment and Plan:  Acute hypoxemic respiratory failure secondary to worsening abdominal distention -Underlying OSA/OHS with known left hemidiaphragm paralysis -Intubated 12/13 -Appreciate PCCM evaluation  Septic shock -Hypotension related to sedation and worsening abdominal distention as well as concern for aspiration pneumonia -Levophed through peripheral line -Continue to monitor a.m. labs as well as lactic acid  Paroxysmal atrial  fibrillation/RVR -Now in sinus rhythm, related to respiratory condition -2D echocardiogram with normal LVEF and grade 1 diastolic dysfunction -Continue IV amiodarone for now -Appreciate cardiology evaluation -No need for anticoagulation at this point  AKI -Continue aggressive IV fluid -Monitor urine output -Avoid nephrotoxic agents -A.m. labs  Incarcerated umbilical hernia/SBO s/p open umbilical hernia repair with mesh 12/9 -Now with some noted ileus with worsening distention -Continue n.p.o. with NG tube to LIS -Possible aspiration with recurrent vomiting and worsening abdominal distention -May require repeat imaging especially with potential worsening hematoma -Potassium goal of 4.0 and magnesium 2.0   History of hypertension -Currently hypotensive and on pressors, holding home medications   Obstructive sleep apnea/obesity hypoventilation syndrome -Currently intubated   Dyslipidemia -Statin on discharge   GERD -PPI   History of asthma -Appears to be mild and intermittent -Continue as needed bronchodilator management. -Continue the use of Singulair. -No wheezing on exam.   Class III obesity -Body mass index is 39.46 kg/m. -Low-calorie diet, portion control and increase physical activity discussed with patient.   Incidental 4 mm right lung nodule -Outpatient follow-up with pulmonary service recommended.    DVT prophylaxis:Lovenox Code Status: Full Family Communication: None at bedside; Mother called by GS Disposition Plan:  Status is: Inpatient Remains inpatient appropriate because: Need for IV medications  Consultants:  Gen Surg Cardiology Pulm  Procedures:  Open umbilical hernia repair with mesh Intubation 12/13  Antimicrobials:  Anti-infectives (From admission, onward)    Start     Dose/Rate Route Frequency Ordered Stop   11/22/22 1400  ceFAZolin (ANCEF) IVPB 3g/100 mL premix        3 g 200 mL/hr over 30 Minutes Intravenous  Once 11/22/22 1202  11/22/22 1245  11/22/22 1208  ceFAZolin (ANCEF) 3-0.9 GM/100ML-% IVPB       Note to Pharmacy: Elvina Sidle S: cabinet override      11/22/22 1208 11/22/22 1310   11/22/22 1200  ceFAZolin (ANCEF) IVPB 2g/100 mL premix  Status:  Discontinued        2 g 200 mL/hr over 30 Minutes Intravenous Every 8 hours 11/22/22 1149 11/22/22 1202       Subjective: Patient seen and evaluated today with worsening respiratory distress earlier this morning.  He was becoming more tachycardic overnight and now is noted to have some hypotension as well.  He required intubation urgently.  Objective: Vitals:   11/26/22 1159 11/26/22 1250 11/26/22 1325 11/26/22 1330  BP: 90/64 (!) 94/58  (!) 100/57  Pulse: 79 77 75 73  Resp: 20 (!) 22 20 (!) 21  Temp:      TempSrc:      SpO2: 99% 98% 100% 99%  Weight:      Height:        Intake/Output Summary (Last 24 hours) at 11/26/2022 1415 Last data filed at 11/26/2022 1310 Gross per 24 hour  Intake 4035.55 ml  Output 5650 ml  Net -1614.45 ml   Filed Weights   11/23/22 1300 11/24/22 1238 11/26/22 0415  Weight: 129.1 kg 128.3 kg 125.7 kg    Examination:  General exam: Appears uncomfortable and moderately anxious Respiratory system: Clear to auscultation.  Respiratory effort increased and tachypneic.  Patient on nonrebreather Cardiovascular system: S1 & S2 heard, irregular and tachycardic Gastrointestinal system: Abdomen is distended with hematoma around surgical site Central nervous system: Alert and awake Extremities: No edema Skin: No significant lesions noted Psychiatry: Difficult to assess    Data Reviewed: I have personally reviewed following labs and imaging studies  CBC: Recent Labs  Lab 11/22/22 0607 11/22/22 0635 11/23/22 0346 11/24/22 0354 11/25/22 0422 11/26/22 0925  WBC 14.5*  --  14.9* 10.2 14.3* 14.6*  NEUTROABS 12.2*  --   --   --   --  10.3*  HGB 14.9 16.3 14.8 14.6 15.9 14.8  HCT 46.0 48.0 45.0 44.3 48.6 44.0  MCV 88.6  --   89.6 87.9 88.0 86.3  PLT 323  --  359 322 398 409*   Basic Metabolic Panel: Recent Labs  Lab 11/22/22 0607 11/22/22 0635 11/23/22 0346 11/25/22 0422 11/26/22 0342  NA 139 141 137 139 137  K 3.7 3.9 3.7 3.5 3.0*  CL 105 101 101 100 94*  CO2 28  --  29 26 28   GLUCOSE 139* 139* 121* 162* 167*  BUN 21* 21* 17 38* 64*  CREATININE 1.03 1.10 1.06 1.58* 2.66*  CALCIUM 9.4  --  8.8* 9.5 8.9  MG  --   --   --  2.9* 3.7*   GFR: Estimated Creatinine Clearance: 40.9 mL/min (A) (by C-G formula based on SCr of 2.66 mg/dL (H)). Liver Function Tests: Recent Labs  Lab 11/22/22 0607  AST 24  ALT 25  ALKPHOS 89  BILITOT 0.7  PROT 8.0  ALBUMIN 4.5   Recent Labs  Lab 11/22/22 0607  LIPASE 45   No results for input(s): "AMMONIA" in the last 168 hours. Coagulation Profile: No results for input(s): "INR", "PROTIME" in the last 168 hours. Cardiac Enzymes: No results for input(s): "CKTOTAL", "CKMB", "CKMBINDEX", "TROPONINI" in the last 168 hours. BNP (last 3 results) No results for input(s): "PROBNP" in the last 8760 hours. HbA1C: No results for input(s): "HGBA1C" in the last 72 hours.  CBG: Recent Labs  Lab 11/22/22 1622 11/26/22 0828 11/26/22 1125  GLUCAP 155* 198* 175*   Lipid Profile: No results for input(s): "CHOL", "HDL", "LDLCALC", "TRIG", "CHOLHDL", "LDLDIRECT" in the last 72 hours. Thyroid Function Tests: No results for input(s): "TSH", "T4TOTAL", "FREET4", "T3FREE", "THYROIDAB" in the last 72 hours. Anemia Panel: No results for input(s): "VITAMINB12", "FOLATE", "FERRITIN", "TIBC", "IRON", "RETICCTPCT" in the last 72 hours. Sepsis Labs: No results for input(s): "PROCALCITON", "LATICACIDVEN" in the last 168 hours.  Recent Results (from the past 240 hour(s))  MRSA Next Gen by PCR, Nasal     Status: None   Collection Time: 11/22/22  5:25 PM   Specimen: Nasal Mucosa; Nasal Swab  Result Value Ref Range Status   MRSA by PCR Next Gen NOT DETECTED NOT DETECTED Final     Comment: (NOTE) The GeneXpert MRSA Assay (FDA approved for NASAL specimens only), is one component of a comprehensive MRSA colonization surveillance program. It is not intended to diagnose MRSA infection nor to guide or monitor treatment for MRSA infections. Test performance is not FDA approved in patients less than 74 years old. Performed at [facility], [address] [place], [place] [address]          Radiology Studies: DG Abd Portable 1V  Result Date: 11/26/2022 CLINICAL DATA:  NG tube placement. EXAM: PORTABLE ABDOMEN - 1 VIEW COMPARISON:  11/25/2022 FINDINGS: The NG tube tip is in the body region of the stomach. Improved bowel gas pattern with less distention of the small bowel. IMPRESSION: NG tube tip is in the body region of the stomach. Electronically Signed   By: Marijo Sanes M.D.   On: 11/26/2022 11:08   DG Chest Portable 1 View  Result Date: 11/26/2022 CLINICAL DATA:  Et tube placement EXAM: PORTABLE CHEST - 1 VIEW COMPARISON:  Earlier film of the same day FINDINGS: Endotracheal tube has been placed, tip 3.1 cm above carina. Nasogastric tube loops in the stomach which is incompletely decompressed. Low lung volumes with linear scarring or atelectasis at the right lung base as before. Elevated left hemidiaphragm as before. Heart size and mediastinal contours are within normal limits. No effusion. Visualized bones unremarkable. IMPRESSION: Endotracheal tube tip 3.1 cm above the carina. Electronically Signed   By: Lucrezia Europe M.D.   On: 11/26/2022 08:23   DG CHEST PORT 1 VIEW  Result Date: 11/26/2022 CLINICAL DATA:  Increasing oxygen demand EXAM: PORTABLE CHEST 1 VIEW COMPARISON:  Yesterday FINDINGS: Enteric tube with tip reaching the stomach. Gas distended stomach also seen on prior. Low volume chest with streaky density at the medial right base. Normal heart size and stable mediastinal contours. No effusion or pneumothorax. IMPRESSION: 1. Continued elevation of the left  diaphragm with gas distended stomach. Please correlate for enteric tube function, the catheter reaches the stomach. 2. Low volume chest with mild atelectasis. Electronically Signed   By: Jorje Guild M.D.   On: 11/26/2022 05:05   DG Chest Port 1 View  Result Date: 11/25/2022 CLINICAL DATA:  Enteric catheter placement EXAM: PORTABLE CHEST 1 VIEW COMPARISON:  09/15/2022, 11/25/2022 FINDINGS: Frontal view of the chest demonstrates enteric catheter passing below diaphragm, tip excluded by collimation. Cardiac silhouette is unremarkable. Low lung volumes, with chronic elevation of the left hemidiaphragm. No airspace disease, effusion, or pneumothorax. IMPRESSION: 1. Terri catheter passes below diaphragm, tip excluded by collimation. 2. Low lung volumes. Electronically Signed   By: Randa Ngo M.D.   On: 11/25/2022 22:11   DG Abd 1 View  Result Date: 11/25/2022 CLINICAL DATA:  Postop ileus EXAM: ABDOMEN - 1 VIEW COMPARISON:  CT 11/22/2022 FINDINGS: Mild gaseous distention of the stomach. Multiple gas dilated small bowel loops in the upper abdomen. Moderate gas and fecal material in the nondilated proximal colon and rectum. IMPRESSION: Multiple gas dilated small bowel loops suggesting ileus or partial obstruction. Electronically Signed   By: Lucrezia Europe M.D.   On: 11/25/2022 14:36   ECHOCARDIOGRAM COMPLETE  Result Date: 11/25/2022    ECHOCARDIOGRAM REPORT   Patient Name:   MONFORD NASCA Date of Exam: 11/25/2022 Medical Rec #:  [id number]        Height:       71.0 in Accession #:    [id number]       Weight:       282.9 lb Date of Birth:  [birth year]        BSA:          2.443 m Patient Age:    65 years         BP:           108/78 mmHg Patient Gender: M                HR:           72 bpm. Exam Location:  [facility] Procedure: 2D Echo, Cardiac Doppler, Color Doppler and Intracardiac            Opacification Agent Indications:    Atrial Fibrillaiton  History:        Patient has prior history of  Echocardiogram examinations, most                 recent 04/23/2018. COPD, Arrythmias:RBBB,                 Signs/Symptoms:Shortness of Breath; Risk Factors:Hypertension                 and Dyslipidemia.  Sonographer:    Wenda Low Referring Phys: [place]  Sonographer Comments: Patient is obese. Image acquisition challenging due to COPD. IMPRESSIONS  1. Left ventricular ejection fraction, by estimation, is 70 to 75%. The left ventricle has hyperdynamic function. The left ventricle has no regional wall motion abnormalities. There is mild left ventricular hypertrophy. Left ventricular diastolic parameters are consistent with Grade I diastolic dysfunction (impaired relaxation).  2. Right ventricular systolic function is normal. The right ventricular size is normal. Tricuspid regurgitation signal is inadequate for assessing PA pressure.  3. The mitral valve is grossly normal, mildly calcified. Trivial mitral valve regurgitation.  4. The aortic valve is tricuspid. Aortic valve regurgitation is not visualized. Aortic valve mean gradient measures 4.0 mmHg.  5. Unable to estimate CVP. Comparison(s): No prior Echocardiogram. FINDINGS  Left Ventricle: Left ventricular ejection fraction, by estimation, is 70 to 75%. The left ventricle has hyperdynamic function. The left ventricle has no regional wall motion abnormalities. Definity contrast agent was given IV to delineate the left ventricular endocardial borders. The left ventricular internal cavity size was normal in size. There is mild left ventricular hypertrophy. Left ventricular diastolic parameters are consistent with Grade I diastolic dysfunction (impaired relaxation). Right Ventricle: The right ventricular size is normal. No increase in right ventricular wall thickness. Right ventricular systolic function is normal. Tricuspid regurgitation signal is inadequate for assessing PA pressure. Left Atrium: Left atrial size was normal in size. Right Atrium:  Right atrial size was normal in size. Pericardium: There is no evidence of pericardial effusion. Mitral  Valve: The mitral valve is grossly normal. There is mild calcification of the mitral valve leaflet(s). Trivial mitral valve regurgitation. MV peak gradient, 2.7 mmHg. The mean mitral valve gradient is 1.0 mmHg. Tricuspid Valve: The tricuspid valve is grossly normal. Tricuspid valve regurgitation is trivial. Aortic Valve: The aortic valve is tricuspid. There is mild aortic valve annular calcification. Aortic valve regurgitation is not visualized. Aortic valve mean gradient measures 4.0 mmHg. Aortic valve peak gradient measures 9.0 mmHg. Aortic valve area, by  VTI measures 2.57 cm. Pulmonic Valve: The pulmonic valve was not well visualized. Pulmonic valve regurgitation is trivial. Aorta: The aortic root is normal in size and structure. Venous: Unable to estimate CVP. The inferior vena cava was not well visualized. IAS/Shunts: The interatrial septum was not well visualized.  LEFT VENTRICLE PLAX 2D LVIDd:         4.40 cm   Diastology LVIDs:         2.40 cm   LV e' medial:    6.53 cm/s LV PW:         1.10 cm   LV E/e' medial:  10.0 LV IVS:        1.20 cm   LV e' lateral:   7.29 cm/s LVOT diam:     2.00 cm   LV E/e' lateral: 9.0 LV SV:         79 LV SV Index:   32 LVOT Area:     3.14 cm  RIGHT VENTRICLE RV Basal diam:  3.70 cm RV Mid diam:    3.60 cm RV S prime:     10.00 cm/s TAPSE (M-mode): 2.5 cm LEFT ATRIUM             Index        RIGHT ATRIUM           Index LA diam:        3.30 cm 1.35 cm/m   RA Area:     17.30 cm LA Vol (A2C):   51.0 ml 20.87 ml/m  RA Volume:   51.00 ml  20.87 ml/m LA Vol (A4C):   44.8 ml 18.34 ml/m LA Biplane Vol: 48.1 ml 19.69 ml/m  AORTIC VALVE                    PULMONIC VALVE AV Area (Vmax):    2.72 cm     PV Vmax:       1.47 m/s AV Area (Vmean):   3.18 cm     PV Peak grad:  8.6 mmHg AV Area (VTI):     2.57 cm AV Vmax:           150.00 cm/s AV Vmean:          87.000 cm/s AV VTI:             0.308 m AV Peak Grad:      9.0 mmHg AV Mean Grad:      4.0 mmHg LVOT Vmax:         130.00 cm/s LVOT Vmean:        88.200 cm/s LVOT VTI:          0.252 m LVOT/AV VTI ratio: 0.82  AORTA Ao Root diam: 3.50 cm MITRAL VALVE MV Area (PHT): 3.19 cm    SHUNTS MV Area VTI:   3.15 cm    Systemic VTI:  0.25 m MV Peak grad:  2.7 mmHg    Systemic Diam: 2.00 cm MV Mean grad:  1.0 mmHg MV Vmax:       0.82 m/s MV Vmean:      43.9 cm/s MV Decel Time: 238 msec MV E velocity: 65.50 cm/s MV A velocity: 79.50 cm/s MV E/A ratio:  0.82 Rozann Lesches MD Electronically signed by Rozann Lesches MD Signature Date/Time: 11/25/2022/11:05:16 AM    Final         Scheduled Meds:  bisacodyl  10 mg Rectal Daily   Chlorhexidine Gluconate Cloth  6 each Topical Daily   docusate  100 mg Per Tube BID   [START ON 11/27/2022] enoxaparin (LOVENOX) injection  0.5 mg/kg Subcutaneous Q24H   fluticasone  1 spray Each Nare Daily   montelukast  10 mg Oral QHS   mouth rinse  15 mL Mouth Rinse Q2H   pantoprazole (PROTONIX) IV  40 mg Intravenous Q12H   pneumococcal 23 valent vaccine  0.5 mL Intramuscular Tomorrow-1000   senna-docusate  2 tablet Per Tube BID   Continuous Infusions:  sodium chloride     acetaminophen 1,000 mg (11/26/22 1302)   amiodarone 30 mg/hr (11/26/22 1310)   lactated ringers 100 mL/hr at 11/26/22 1310   norepinephrine (LEVOPHED) Adult infusion 7 mcg/min (11/26/22 1310)   propofol (DIPRIVAN) infusion 25 mcg/kg/min (11/26/22 1353)     LOS: 4 days    Critical care time: 40 minutes.  40 minutes were spent on direct patient care and communication as well as review of the data and ordering of labs.    Kaydince Towles Darleen Crocker, DO [facility]  If 7PM-7AM, please contact night-coverage [email or web address] 11/26/2022, 2:15 PM

## 2022-11-26 NOTE — Progress Notes (Signed)
ABG drawn and taken to lab 

## 2022-11-26 NOTE — Consult Note (Signed)
NAME:  Kyle Burns, MRN:  [id number], DOB:  [birth year], LOS: 4 ADMISSION DATE:  11/22/2022, CONSULTATION DATE:  11/26/2022  REFERRING MD:  Gwendolyn Lima, CHIEF COMPLAINT: Respiratory failure  History of Present Illness:  56 year old man with OSA/OHS admitted for small bowel obstruction due to incarcerated umbilical hernia.  He underwent open umbilical hernia repair with mesh placement.  Postop day 3 he developed abdominal distention and atrial fibrillation/RVR.  NG tube was placed and 3 L of fluid aspirated.  He required IV Cardizem and then amiodarone bolus and drip also digoxin 0.25 mg IV.  He developed dyspnea and was started on IV Zosyn due to concern for aspiration. He was intubated 2/13 for respiratory distress and tachycardia and PCCM consulted  Pertinent  Medical History  Hypertension Asthma OSA/OHS on BiPAP with oxygen during sleep -PSG 05/17/15 >> AHI 29.5, SpO2 low 61% Bipap 05/12/18 >> Bipap 17/13 cm H2O, 2 liters oxygen Chronic respiratory failure with restrictive lung disease due to left diaphragm paralysis on sniff test 06/2014  PFT 06/04/18 >> FEV1 2.11 (62%), FEV1% 89, TLC 4.89 (68%), DLCO 62%   Significant Hospital Events: Including procedures, antibiotic start and stop dates in addition to other pertinent events   12/9 open umbilical hernia repair with mesh placement. 12/13 ETT >>  Interim History / Subjective:  Sedated with propofol Critically ill On low-dose Levophed On amiodarone drip, converted back to sinus rhythm Second canister full with NG aspirate  Objective   Blood pressure (!) 94/58, pulse 77, temperature 98.4 F (36.9 C), temperature source Axillary, resp. rate (!) 22, height 5\' 11"  (1.803 m), weight 125.7 kg, SpO2 98 %.    Vent Mode: PRVC FiO2 (%):  [100 %] 100 % Set Rate:  [18 bmp-20 bmp] 20 bmp Vt Set:  [550 mL-600 mL] 550 mL PEEP:  [5 cmH20] 5 cmH20 Plateau Pressure:  [19 cmH20-21 cmH20] 21 cmH20   Intake/Output Summary (Last 24 hours) at  11/26/2022 1312 Last data filed at 11/26/2022 1310 Gross per 24 hour  Intake 4035.55 ml  Output 5550 ml  Net -1514.45 ml   Filed Weights   11/23/22 1300 11/24/22 1238 11/26/22 0415  Weight: 129.1 kg 128.3 kg 125.7 kg    Examination: General: Obese man, orally intubated, sedated, no distress HENT: No pallor, icterus, no JVD Lungs: Decreased breath sounds bilateral, no accessory muscle use Cardiovascular: S1-S2 distant Abdomen: Distended, bowel sounds absent, no guarding Extremities: No deformity, no edema Neuro: Sedate, RASS -3   Labs show mild leukocytosis, hypokalemia, increasing BUN/creatinine X-ray abdomen shows decreased dilation of small bowel loops, NG tube in position. Chest x-ray dependently reviewed shows ET tube in position, elevated left hemidiaphragm, right lower lobe atelectasis without clear infiltrate  Resolved Hospital Problem list     Assessment & Plan:  Acute respiratory failure with hypoxia , related to abdominal distention Underlying OSA/OHS Known left hemidiaphragm paralysis  -Continue low tidal volume ventilation -Hold off spontaneous breathing trials until abdominal distention improves -Some concern for aspiration yesterday and started on Zosyn, obtain respiratory culture and provide tracheobronchial toilet while intubated  Septic shock -Related to abdominal issues and sedation medications/propofol -Use Levophed PIV -Attempt to lower propofol for goal RASS -1 to -2  AKI -related to above Supplement hypokalemia aggressively. Normal saline at 100 cc an hour Trend BMET and urine output  Atrial fibrillation/RVR -converted to sinus rhythm -Related to respiratory worsening -Echo shows normal LVEF, hyperdynamic, grade 1 diastolic dysfunction Continue IV amiodarone for another 24 hours and then can  discontinue  Small bowel obstruction vs post op ileus/incarcerated hernia repair -Surgery following, NG tube to LIS -May need repeat imaging if does  not improve with conservative management  Best Practice (right click and "Reselect all SmartList Selections" daily)   Diet/type: NPO DVT prophylaxis: prophylactic heparin  GI prophylaxis: PPI Lines: N/A Foley:  Yes, and it is still needed Code Status:  full code Last date of multidisciplinary goals of care discussion [mother at bedside]  Labs   CBC: Recent Labs  Lab 11/22/22 0607 11/22/22 0635 11/23/22 0346 11/24/22 0354 11/25/22 0422 11/26/22 0925  WBC 14.5*  --  14.9* 10.2 14.3* 14.6*  NEUTROABS 12.2*  --   --   --   --  10.3*  HGB 14.9 16.3 14.8 14.6 15.9 14.8  HCT 46.0 48.0 45.0 44.3 48.6 44.0  MCV 88.6  --  89.6 87.9 88.0 86.3  PLT 323  --  359 322 398 409*    Basic Metabolic Panel: Recent Labs  Lab 11/22/22 0607 11/22/22 0635 11/23/22 0346 11/25/22 0422 11/26/22 0342  NA 139 141 137 139 137  K 3.7 3.9 3.7 3.5 3.0*  CL 105 101 101 100 94*  CO2 28  --  29 26 28   GLUCOSE 139* 139* 121* 162* 167*  BUN 21* 21* 17 38* 64*  CREATININE 1.03 1.10 1.06 1.58* 2.66*  CALCIUM 9.4  --  8.8* 9.5 8.9  MG  --   --   --  2.9* 3.7*   GFR: Estimated Creatinine Clearance: 40.9 mL/min (A) (by C-G formula based on SCr of 2.66 mg/dL (H)). Recent Labs  Lab 11/23/22 0346 11/24/22 0354 11/25/22 0422 11/26/22 0925  WBC 14.9* 10.2 14.3* 14.6*    Liver Function Tests: Recent Labs  Lab 11/22/22 0607  AST 24  ALT 25  ALKPHOS 89  BILITOT 0.7  PROT 8.0  ALBUMIN 4.5   Recent Labs  Lab 11/22/22 0607  LIPASE 45   No results for input(s): "AMMONIA" in the last 168 hours.  ABG    Component Value Date/Time   PHART 7.45 11/26/2022 0859   PCO2ART 43 11/26/2022 0859   PO2ART 103 11/26/2022 0859   HCO3 29.9 (H) 11/26/2022 0859   TCO2 28 11/22/2022 0635   O2SAT 97.6 11/26/2022 0859     Coagulation Profile: No results for input(s): "INR", "PROTIME" in the last 168 hours.  Cardiac Enzymes: No results for input(s): "CKTOTAL", "CKMB", "CKMBINDEX", "TROPONINI" in the  last 168 hours.  HbA1C: HB A1C (BAYER DCA - WAIVED)  Date/Time Value Ref Range Status  11/04/2021 04:18 PM 5.7 (H) 4.8 - 5.6 % Final    Comment:             Prediabetes: 5.7 - 6.4          Diabetes: >6.4          Glycemic control for adults with diabetes: <7.0     CBG: Recent Labs  Lab 11/22/22 1622 11/26/22 0828 11/26/22 1125  GLUCAP 155* 198* 175*    Review of Systems:   Unable to obtain since intubated and sedated  Past Medical History:  He,  has a past medical history of Back pain, GERD (gastroesophageal reflux disease), History of asthma, HTN (hypertension), Hyperlipidemia, and OSA (obstructive sleep apnea).   Surgical History:   Past Surgical History:  Procedure Laterality Date   bilateral hernia repair     total of 3    CYST REMOVAL NECK     age - 51s  Social History:   reports that he has never smoked. He quit smokeless tobacco use about 3 years ago.  His smokeless tobacco use included chew. He reports that he does not drink alcohol and does not use drugs.   Family History:  His family history includes Arthritis in his father and mother; Asthma in his father, maternal grandfather, and paternal grandfather; Cancer in his father; Crohn's disease in his mother; Diabetes in his maternal grandfather and mother; Hypertension in his maternal grandmother and sister; Multiple sclerosis in his brother; Stroke in his maternal grandfather.   Allergies No Known Allergies   Home Medications  Prior to Admission medications   Medication Sig Start Date End Date Taking? Authorizing Provider  albuterol (VENTOLIN HFA) 108 (90 Base) MCG/ACT inhaler USE 2 PUFFS EVERY 6 HOURS AS NEEDED FOR WHEEZING (1 for home, 1 for work) Patient taking differently: Inhale 2 puffs into the lungs every 6 (six) hours as needed for wheezing. 09/17/22  Yes Daryll Drown, NP  aspirin 81 MG EC tablet Take 162 mg by mouth in the morning.   Yes [provider]  atorvastatin (LIPITOR) 10  MG tablet Take 1 tablet (10 mg total) by mouth daily. Patient taking differently: Take 10 mg by mouth at bedtime. 11/04/21  Yes Gottschalk, Ashly M, DO  budesonide-formoterol (SYMBICORT) 80-4.5 MCG/ACT inhaler Inhale 2 puffs into the lungs 2 (two) times daily. Patient taking differently: Inhale 2 puffs into the lungs daily as needed (wheezing). 11/04/21  Yes Gottschalk, Kathie Rhodes M, DO  esomeprazole (NEXIUM) 40 MG capsule Take 1 capsule (40 mg total) by mouth daily. Patient taking differently: Take 40 mg by mouth in the morning. 11/04/21  Yes Gottschalk, Kathie Rhodes M, DO  famotidine (PEPCID) 20 MG tablet One after supper Patient taking differently: Take 20 mg by mouth every evening. After supper 09/24/22  Yes [name], MD  FLUoxetine (PROZAC) 20 MG capsule TAKE ONE (1) CAPSULE EACH DAY Patient taking differently: Take 20 mg by mouth at bedtime. 10/06/22  Yes Gottschalk, Ashly M, DO  fluticasone (FLONASE) 50 MCG/ACT nasal spray Place 2 sprays into both nostrils daily. Patient taking differently: Place 2 sprays into both nostrils in the morning. 11/04/21  Yes Gottschalk, Ashly M, DO  furosemide (LASIX) 20 MG tablet TAKE ONE (1) TABLET EACH DAY Patient taking differently: Take 20 mg by mouth in the morning. 11/04/21  Yes Gottschalk, Kathie Rhodes M, DO  levocetirizine (XYZAL) 5 MG tablet Take 1 tablet (5 mg total) by mouth every evening. To replace Zyrtec Patient taking differently: Take 5 mg by mouth at bedtime. 10/06/22  Yes Gottschalk, Ashly M, DO  montelukast (SINGULAIR) 10 MG tablet Take 1 tablet (10 mg total) by mouth at bedtime. 09/12/22  Yes Rakes, Doralee Albino, FNP  Multiple Vitamins-Minerals (ONE-A-DAY MENS 50+) TABS Take 1 tablet by mouth in the morning.   Yes [provider]  spironolactone (ALDACTONE) 25 MG tablet TAKE ONE (1) TABLET EACH DAY Patient taking differently: Take 25 mg by mouth in the morning. 11/04/21  Yes Gottschalk, Ashly M, DO  benzonatate (TESSALON) 200 MG capsule Take 1  capsule (200 mg total) by mouth 3 (three) times daily as needed for cough. Patient not taking: Reported on 11/23/2022 11/14/22   [name], MD  brompheniramine-pseudoephedrine-DM 30-2-10 MG/5ML syrup Take 5 mLs by mouth 4 (four) times daily as needed. Patient not taking: Reported on 11/23/2022 09/12/22   Daryll Drown, NP  guaiFENesin (MUCINEX) 600 MG 12 hr tablet Take 1 tablet (600  mg total) by mouth 2 (two) times daily. Patient not taking: Reported on 11/23/2022 09/17/22   Ivy Lynn, NP  losartan-hydrochlorothiazide (HYZAAR) 100-25 MG tablet Take 1 tablet by mouth daily. Patient taking differently: Take 1 tablet by mouth in the morning. 11/04/21   Janora Norlander, DO     Critical care time: 45 minutes     Kara Mead MD. FCCP. [facility] Pager : [phone]  If no response to pager , please call [phone] until 7 pm After 7:00 pm call Elink  [phone]   11/26/2022  .

## 2022-11-26 NOTE — Progress Notes (Addendum)
Critical care note:  Date of note: 11/26/2022  Subjective: Patient has been having persistent atrial fibrillation with rapid ventricular response despite IV Cardizem drip.  Blood pressure was borderline low.  Was ordered IV amiodarone bolus and drip.  He denied any chest pain or palpitations.  He admits to dyspnea with cough and inability to expectorate without wheezing.  No fever or chills.  He has significant amount of dark NG aspirate without bright red blood.  During my interview, his canister that was full with close to 3 L..  Objective: Physical Examination: Generally: Acutely ill admission African-American male in moderate respiratory distress with conversational dyspnea Vital signs: BP was 117/84 with a heart rate of 151 mm 139 respiratory therapist 39-41 and pulse currently was 92 to 94% on 4 L of O2 with nasal cannula. Head - atraumatic, normocephalic.  Pupils - equal, round and reactive to light and accommodation. Extraocular movements are intact. No scleral icterus.  Oropharynx - moist mucous membranes and tongue. No pharyngeal erythema or exudate.  Neck - supple. No JVD. Carotid pulses 2+ bilaterally. No carotid bruits. No palpable thyromegaly or lymphadenopathy. Cardiovascular -irregularly irregular tachycardic rhythm. Normal S1 and S2. No murmurs, gallops or rubs.  Lungs -diminished bibasilar breath sounds with bibasal crackles. Abdomen - soft postoperative, mildly distended and nontender. Positive bowel sounds. No palpable organomegaly or masses.  Extremities - no pitting edema, clubbing or cyanosis.  Neuro - grossly non-focal. Skin - no rashes. GU and rectal exam - deferred.   Labs and notes were reviewed.  Stat ABG showed, pH 7.49, pCO2 42, pO2 of 71, HCO3 of 32 and O2 sat of 93.1% on 4 L O2 nasal cannula.  Included a 94, glucose of 167 with a BUN of 64 and creatinine 2.66 compared to 3/1.58.  Assessment/plan: 1.  Acute respiratory failure with hypoxia. - He was satting  well and was given 40 mg of IV Lasix and 2 mg of IV morphine sulfate for pulmonary congestion. - I am concerned about aspiration given significant NG tube drainage with SBO and therefore we will place him on IV Zosyn.  2.  Atrial fibrillation with rapid ventricular response. - The patient was placed on IV Cardizem earlier.  I added IV amiodarone bolus and drip.  Initiated nonresponder will give a dose of 0.25 mg of IV digoxin.  He went from 1 50-1 20s and later on labetalol 50. - Currently came down to 120s. - We will optimize his potassium and check his magnesium level. -Will continue other current care management plan.  Authorized and performed by: Valente David, MD Total critical care time: Approximately 45     minutes. Due to a high probability of clinically significant, life-threatening deterioration, the patient required my highest level of preparedness to intervene emergently and I personally spent this critical care time directly and personally managing the patient.  This critical care time included obtaining a history, examining the patient, pulse oximetry, ordering and review of studies, arranging urgent treatment with development of management plan, evaluation of patient's response to treatment, frequent reassessment, and discussions with other providers. This critical care time was performed to assess and manage the high probability of imminent, life-threatening deterioration that could result in multiorgan failure.  It was exclusive of separately billable procedures and treating other patients and teaching time.  Marland Kitchen

## 2022-11-26 NOTE — Consult Note (Signed)
Cardiology Consultation:   Patient ID: Kyle Burns; [id number]; [birth year]   Admit date: 11/22/2022 Date of Consult: 11/26/2022  Primary Care Provider: Raliegh Ip, DO Primary Cardiologist: New to [facility] Primary Electrophysiologist: None   Patient Profile:   Kyle Burns is a 56 y.o. male with a history of hypertension, hyperlipidemia, GERD, OSA/OHS on nightly BiPAP, and obesity who is being seen today for the evaluation of atrial fibrillation with RVR at the request of Dr. Sherryll Burns.  History of Present Illness:   [name] is status post incarcerated umbilical hernia with mesh repair on December 9 by Dr. Robyne Peers.  He has had evidence of postoperative ileus and partial obstructive symptoms, NG tube in place yesterday.  Postoperative course also notable for recurrent atrial fibrillation with RVR, initially spontaneously converted to sinus rhythm on IV Cardizem, however ultimately recurrent and then resolved with initiation of IV amiodarone.  Overnight notes indicate worsening shortness of breath, nonproductive cough, also dark NG aspirate.  He developed hypoxic respiratory failure and was intubated and placed on ventilator where he remains this morning.  Chest x-ray shows low lung volumes with atelectasis, no infiltrates.  At time of my consultation he had already been taken off IV Cardizem, continues on IV amiodarone and is in sinus rhythm by telemetry which I personally reviewed.  ECG from December 12 showed rapid atrial fibrillation with incomplete right bundle branch block.  CHA2DS2-VASc score is 1 at this point.  Echocardiogram from December 12 shows vigorous LVEF at 70 to 75% with mild LVH and mild diastolic dysfunction, normal RV contraction, and trivial mitral regurgitation.  Left atrial chamber size normal.  Past Medical History:  Diagnosis Date   Back pain    GERD (gastroesophageal reflux disease)    History of asthma    HTN (hypertension)     Hyperlipidemia    OSA (obstructive sleep apnea)    BiPAP    Past Surgical History:  Procedure Laterality Date   bilateral hernia repair     total of 3    CYST REMOVAL NECK     age 38 60s     Inpatient Medications: Scheduled Meds:  acetaminophen  1,000 mg Oral Q6H   atorvastatin  10 mg Oral Daily   bisacodyl  10 mg Rectal Daily   Chlorhexidine Gluconate Cloth  6 each Topical Daily   docusate  100 mg Per Tube BID   enoxaparin (LOVENOX) injection  130 mg Subcutaneous Q12H   fluticasone  1 spray Each Nare Daily   loratadine  10 mg Oral QPM   montelukast  10 mg Oral QHS   pantoprazole (PROTONIX) IV  40 mg Intravenous Q12H   pneumococcal 23 valent vaccine  0.5 mL Intramuscular Tomorrow-1000   polyethylene glycol  17 g Per Tube Daily   senna-docusate  2 tablet Oral BID   Continuous Infusions:  sodium chloride     amiodarone 30 mg/hr (11/26/22 0854)   diltiazem (CARDIZEM) infusion Stopped (11/26/22 0827)   lactated ringers 999 mL/hr at 11/26/22 0840   norepinephrine (LEVOPHED) Adult infusion     potassium chloride Stopped (11/26/22 0745)   propofol (DIPRIVAN) infusion 5 mcg/kg/min (11/26/22 0840)   PRN Meds: alum & mag hydroxide-simeth, hydrALAZINE, levalbuterol, midazolam, morphine injection, ondansetron (ZOFRAN) IV, oxyCODONE, phenol, prochlorperazine  Allergies:   No Known Allergies  Social History:   Social History   Socioeconomic History   Marital status: Single    Spouse name: Not on file   Number of children:  0   Years of education: Not on file   Highest education level: Not on file  Occupational History   Not on file  Tobacco Use   Smoking status: Never   Smokeless tobacco: Former    Types: Chew    Quit date: 10/2019   Tobacco comments:    Uses chew "when he has a bad day"  Vaping Use   Vaping Use: Never used  Substance and Sexual Activity   Alcohol use: No    Comment: Socially   Drug use: No   Sexual activity: Not on file  Other Topics Concern    Not on file  Social History Narrative   Not on file   Social Determinants of Health   Financial Resource Strain: Not on file  Food Insecurity: No Food Insecurity (11/22/2022)   Hunger Vital Sign    Worried About Running Out of Food in the Last Year: Never true    Ran Out of Food in the Last Year: Never true  Transportation Needs: No Transportation Needs (11/22/2022)   PRAPARE - Hydrologist (Medical): No    Lack of Transportation (Non-Medical): No  Physical Activity: Not on file  Stress: Not on file  Social Connections: Not on file  Intimate Partner Violence: Not At Risk (11/22/2022)   Humiliation, Afraid, Rape, and Kick questionnaire    Fear of Current or Ex-Partner: No    Emotionally Abused: No    Physically Abused: No    Sexually Abused: No    Family History:   The patient's family history includes Arthritis in his father and mother; Asthma in his father, maternal grandfather, and paternal grandfather; Cancer in his father; Crohn's disease in his mother; Diabetes in his maternal grandfather and mother; Hypertension in his maternal grandmother and sister; Multiple sclerosis in his brother; Stroke in his maternal grandfather.  ROS:  Please see the history of present illness.  Unable to obtain as patient sedated on ventilator.  Physical Exam/Data:   Vitals:   11/26/22 0825 11/26/22 0830 11/26/22 0831 11/26/22 0835  BP: (!) 57/28 (!) 73/53 (!) 73/53 (!) 70/42  Pulse: (!) 102 99 96 93  Resp: (!) 24 (!) 26 (!) 24 (!) 23  Temp:      TempSrc:      SpO2: 97% 98% 98% 98%  Weight:      Height:        Intake/Output Summary (Last 24 hours) at 11/26/2022 0906 Last data filed at 11/26/2022 0840 Gross per 24 hour  Intake 3027.18 ml  Output 4050 ml  Net -1022.82 ml   Filed Weights   11/23/22 1300 11/24/22 1238 11/26/22 0415  Weight: 129.1 kg 128.3 kg 125.7 kg   Body mass index is 38.65 kg/m.   Gen: No acute distress on ventilator. HEENT:  Conjunctiva and lids normal, ET tube in place. Neck: Supple, increased girth, unable to assess JVP, no thyromegaly. Lungs: Clear anteriorly, nonlabored on ventilator. Cardiac: Regular rate and rhythm, no S3 or significant systolic murmur, no pericardial rub. Abdomen: Protuberant with decreased bowel sounds. Extremities: No pitting edema, distal pulses 2+. Skin: Warm and dry. Musculoskeletal: No kyphosis. Neuropsychiatric: Currently sedated.  Some spontaneous arm and leg movement noted.  EKG:  An ECG dated 11/22/2022 was personally reviewed today and demonstrated:  Sinus rhythm with incomplete right bundle branch block.  Telemetry:  I personally reviewed telemetry which shows sinus rhythm.  Laboratory Data:  Chemistry Recent Labs  Lab 11/23/22 0346 11/25/22 0422 11/26/22  0342  NA 137 139 137  K 3.7 3.5 3.0*  CL 101 100 94*  CO2 29 26 28   GLUCOSE 121* 162* 167*  BUN 17 38* 64*  CREATININE 1.06 1.58* 2.66*  CALCIUM 8.8* 9.5 8.9  GFRNONAA >60 50* 27*  ANIONGAP 7 13 15     Recent Labs  Lab 11/22/22 0607  PROT 8.0  ALBUMIN 4.5  AST 24  ALT 25  ALKPHOS 89  BILITOT 0.7   Hematology Recent Labs  Lab 11/23/22 0346 11/24/22 0354 11/25/22 0422  WBC 14.9* 10.2 14.3*  RBC 5.02 5.04 5.52  HGB 14.8 14.6 15.9  HCT 45.0 44.3 48.6  MCV 89.6 87.9 88.0  MCH 29.5 29.0 28.8  MCHC 32.9 33.0 32.7  RDW 13.1 13.2 13.2  PLT 359 322 398   Cardiac Enzymes Recent Labs  Lab 11/24/22 1948 11/24/22 2141  TROPONINIHS 10 9   Radiology/Studies:  DG Chest Portable 1 View  Result Date: 11/26/2022 CLINICAL DATA:  Et tube placement EXAM: PORTABLE CHEST - 1 VIEW COMPARISON:  Earlier film of the same day FINDINGS: Endotracheal tube has been placed, tip 3.1 cm above carina. Nasogastric tube loops in the stomach which is incompletely decompressed. Low lung volumes with linear scarring or atelectasis at the right lung base as before. Elevated left hemidiaphragm as before. Heart size and  mediastinal contours are within normal limits. No effusion. Visualized bones unremarkable. IMPRESSION: Endotracheal tube tip 3.1 cm above the carina. Electronically Signed   By: Lucrezia Europe M.D.   On: 11/26/2022 08:23   DG CHEST PORT 1 VIEW  Result Date: 11/26/2022 CLINICAL DATA:  Increasing oxygen demand EXAM: PORTABLE CHEST 1 VIEW COMPARISON:  Yesterday FINDINGS: Enteric tube with tip reaching the stomach. Gas distended stomach also seen on prior. Low volume chest with streaky density at the medial right base. Normal heart size and stable mediastinal contours. No effusion or pneumothorax. IMPRESSION: 1. Continued elevation of the left diaphragm with gas distended stomach. Please correlate for enteric tube function, the catheter reaches the stomach. 2. Low volume chest with mild atelectasis. Electronically Signed   By: Jorje Guild M.D.   On: 11/26/2022 05:05   DG Chest Port 1 View  Result Date: 11/25/2022 CLINICAL DATA:  Enteric catheter placement EXAM: PORTABLE CHEST 1 VIEW COMPARISON:  09/15/2022, 11/25/2022 FINDINGS: Frontal view of the chest demonstrates enteric catheter passing below diaphragm, tip excluded by collimation. Cardiac silhouette is unremarkable. Low lung volumes, with chronic elevation of the left hemidiaphragm. No airspace disease, effusion, or pneumothorax. IMPRESSION: 1. Terri catheter passes below diaphragm, tip excluded by collimation. 2. Low lung volumes. Electronically Signed   By: Randa Ngo M.D.   On: 11/25/2022 22:11   DG Abd 1 View  Result Date: 11/25/2022 CLINICAL DATA:  Postop ileus EXAM: ABDOMEN - 1 VIEW COMPARISON:  CT 11/22/2022 FINDINGS: Mild gaseous distention of the stomach. Multiple gas dilated small bowel loops in the upper abdomen. Moderate gas and fecal material in the nondilated proximal colon and rectum. IMPRESSION: Multiple gas dilated small bowel loops suggesting ileus or partial obstruction. Electronically Signed   By: Lucrezia Europe M.D.   On:  11/25/2022 14:36   ECHOCARDIOGRAM COMPLETE  Result Date: 11/25/2022    ECHOCARDIOGRAM REPORT   Patient Name:   KAMANI RUTENBERG Date of Exam: 11/25/2022 Medical Rec #:  [id number]        Height:       71.0 in Accession #:    [id number]  Weight:       282.9 lb Date of Birth:  July 01, 1966        BSA:          2.443 m Patient Age:    56 years         BP:           108/78 mmHg Patient Gender: M                HR:           72 bpm. Exam Location:  [facility] Procedure: 2D Echo, Cardiac Doppler, Color Doppler and Intracardiac            Opacification Agent Indications:    Atrial Fibrillaiton  History:        Patient has prior history of Echocardiogram examinations, most                 recent 04/23/2018. COPD, Arrythmias:RBBB,                 Signs/Symptoms:Shortness of Breath; Risk Factors:Hypertension                 and Dyslipidemia.  Sonographer:    Wenda Low Referring Phys: [place]  Sonographer Comments: Patient is obese. Image acquisition challenging due to COPD. IMPRESSIONS  1. Left ventricular ejection fraction, by estimation, is 70 to 75%. The left ventricle has hyperdynamic function. The left ventricle has no regional wall motion abnormalities. There is mild left ventricular hypertrophy. Left ventricular diastolic parameters are consistent with Grade I diastolic dysfunction (impaired relaxation).  2. Right ventricular systolic function is normal. The right ventricular size is normal. Tricuspid regurgitation signal is inadequate for assessing PA pressure.  3. The mitral valve is grossly normal, mildly calcified. Trivial mitral valve regurgitation.  4. The aortic valve is tricuspid. Aortic valve regurgitation is not visualized. Aortic valve mean gradient measures 4.0 mmHg.  5. Unable to estimate CVP. Comparison(s): No prior Echocardiogram. FINDINGS  Left Ventricle: Left ventricular ejection fraction, by estimation, is 70 to 75%. The left ventricle has hyperdynamic function. The left  ventricle has no regional wall motion abnormalities. Definity contrast agent was given IV to delineate the left ventricular endocardial borders. The left ventricular internal cavity size was normal in size. There is mild left ventricular hypertrophy. Left ventricular diastolic parameters are consistent with Grade I diastolic dysfunction (impaired relaxation). Right Ventricle: The right ventricular size is normal. No increase in right ventricular wall thickness. Right ventricular systolic function is normal. Tricuspid regurgitation signal is inadequate for assessing PA pressure. Left Atrium: Left atrial size was normal in size. Right Atrium: Right atrial size was normal in size. Pericardium: There is no evidence of pericardial effusion. Mitral Valve: The mitral valve is grossly normal. There is mild calcification of the mitral valve leaflet(s). Trivial mitral valve regurgitation. MV peak gradient, 2.7 mmHg. The mean mitral valve gradient is 1.0 mmHg. Tricuspid Valve: The tricuspid valve is grossly normal. Tricuspid valve regurgitation is trivial. Aortic Valve: The aortic valve is tricuspid. There is mild aortic valve annular calcification. Aortic valve regurgitation is not visualized. Aortic valve mean gradient measures 4.0 mmHg. Aortic valve peak gradient measures 9.0 mmHg. Aortic valve area, by  VTI measures 2.57 cm. Pulmonic Valve: The pulmonic valve was not well visualized. Pulmonic valve regurgitation is trivial. Aorta: The aortic root is normal in size and structure. Venous: Unable to estimate CVP. The inferior vena cava was not well visualized. IAS/Shunts: The interatrial septum was not  well visualized.  LEFT VENTRICLE PLAX 2D LVIDd:         4.40 cm   Diastology LVIDs:         2.40 cm   LV e' medial:    6.53 cm/s LV PW:         1.10 cm   LV E/e' medial:  10.0 LV IVS:        1.20 cm   LV e' lateral:   7.29 cm/s LVOT diam:     2.00 cm   LV E/e' lateral: 9.0 LV SV:         79 LV SV Index:   32 LVOT Area:      3.14 cm  RIGHT VENTRICLE RV Basal diam:  3.70 cm RV Mid diam:    3.60 cm RV S prime:     10.00 cm/s TAPSE (M-mode): 2.5 cm LEFT ATRIUM             Index        RIGHT ATRIUM           Index LA diam:        3.30 cm 1.35 cm/m   RA Area:     17.30 cm LA Vol (A2C):   51.0 ml 20.87 ml/m  RA Volume:   51.00 ml  20.87 ml/m LA Vol (A4C):   44.8 ml 18.34 ml/m LA Biplane Vol: 48.1 ml 19.69 ml/m  AORTIC VALVE                    PULMONIC VALVE AV Area (Vmax):    2.72 cm     PV Vmax:       1.47 m/s AV Area (Vmean):   3.18 cm     PV Peak grad:  8.6 mmHg AV Area (VTI):     2.57 cm AV Vmax:           150.00 cm/s AV Vmean:          87.000 cm/s AV VTI:            0.308 m AV Peak Grad:      9.0 mmHg AV Mean Grad:      4.0 mmHg LVOT Vmax:         130.00 cm/s LVOT Vmean:        88.200 cm/s LVOT VTI:          0.252 m LVOT/AV VTI ratio: 0.82  AORTA Ao Root diam: 3.50 cm MITRAL VALVE MV Area (PHT): 3.19 cm    SHUNTS MV Area VTI:   3.15 cm    Systemic VTI:  0.25 m MV Peak grad:  2.7 mmHg    Systemic Diam: 2.00 cm MV Mean grad:  1.0 mmHg MV Vmax:       0.82 m/s MV Vmean:      43.9 cm/s MV Decel Time: 238 msec MV E velocity: 65.50 cm/s MV A velocity: 79.50 cm/s MV E/A ratio:  0.82 Rozann Lesches MD Electronically signed by Rozann Lesches MD Signature Date/Time: 11/25/2022/11:05:16 AM    Final     Assessment and Plan:   1.  Postoperative paroxysmal atrial fibrillation with RVR.  CHA2DS2-VASc score is 1 at this point.  LVEF vigorous at 70 to 75%, left atrial chamber size normal and there is only trivial mitral regurgitation.  He is currently in sinus rhythm on IV amiodarone, IV diltiazem has been discontinued.  No prior history of cardiac arrhythmia based on available information.  2.  Incarcerated  umbilical hernia status post surgical mesh repair on December 9 and with postoperative ileus and partial obstructive symptoms.  Has had recent bowel decompression with NG tube and abdomen remains distended.  3.  OSA/OHS with  nightly BiPAP use.  4.  Mixed hyperlipidemia, treated with Lipitor.  5.  Essential hypertension, as outpatient on Aldactone and Hyzaar.  Antihypertensives currently held due to hypotension in the setting of current comorbid illness.  Chart reviewed, discussed with nursing and also family members present.  Would recommend continuing IV amiodarone for now to hopefully provide suppression of atrial fibrillation during current treatment and recovery.  CCM/pulmonary consultation for ventilator management.  Would hold off on full anticoagulation at this point particularly with CHA2DS2-VASc score of 1.  Chest x-ray shows no pulmonary edema, he was on low-dose Lasix as an outpatient and intermittent IV diuresis could be utilized if necessary.  Signed, Rozann Lesches, MD  11/26/2022 9:06 AM

## 2022-11-26 NOTE — Progress Notes (Signed)
[facility] Progress Note  4 Days Post-Op  Subjective: Patient seen and examined.  He was just recently intubated this morning for respiratory distress and tachycardia.  Overnight, he began to have worsening nausea with multiple episodes of vomiting, so NG tube was placed.  He underwent a KUB yesterday which demonstrated dilated loops of small bowel, likely ileus.  His NG tube has had 3.1 L out overnight.  Patient is able to answer yes or no questions.  He currently denies abdominal pain.  Objective: Vital signs in last 24 hours: Temp:  [97.6 F (36.4 C)-98.6 F (37 C)] 98.6 F (37 C) (12/13 0415) Pulse Rate:  [51-151] 93 (12/13 0835) Resp:  [23-51] 23 (12/13 0835) BP: (56-153)/(28-104) 70/42 (12/13 0835) SpO2:  [85 %-99 %] 98 % (12/13 0835) FiO2 (%):  [100 %] 100 % (12/13 0816) Weight:  [125.7 kg] 125.7 kg (12/13 0415) Last BM Date : 11/24/22  Intake/Output from previous day: 12/12 0701 - 12/13 0700 In: 1804.5 [P.O.:850; I.V.:954.5] Out: 4250 [Urine:300; Emesis/NG output:3950] Intake/Output this shift: Total I/O In: 1669.9 [I.V.:1507.1; IV Piggyback:162.8] Out: -   General appearance: alert and no distress Nose: NG tube in place with liquid brown output in canister GI: Abdomen soft, moderate distention, tense, no percussion tenderness, nontender to palpation; no rigidity, guarding, or rebound tenderness; incision site with overlying skin glue, small punctate area of bleeding at left lateral aspect of incision, bruising overlying surgery site, and palpable fluid under skin, likely hematoma versus seroma  Lab Results:  Recent Labs    11/25/22 0422 11/26/22 0925  WBC 14.3* 14.6*  HGB 15.9 14.8  HCT 48.6 44.0  PLT 398 409*   BMET Recent Labs    11/25/22 0422 11/26/22 0342  NA 139 137  K 3.5 3.0*  CL 100 94*  CO2 26 28  GLUCOSE 162* 167*  BUN 38* 64*  CREATININE 1.58* 2.66*  CALCIUM 9.5 8.9   PT/INR No results for input(s): "LABPROT", "INR" in  the last 72 hours.  Studies/Results: DG Chest Portable 1 View  Result Date: 11/26/2022 CLINICAL DATA:  Et tube placement EXAM: PORTABLE CHEST - 1 VIEW COMPARISON:  Earlier film of the same day FINDINGS: Endotracheal tube has been placed, tip 3.1 cm above carina. Nasogastric tube loops in the stomach which is incompletely decompressed. Low lung volumes with linear scarring or atelectasis at the right lung base as before. Elevated left hemidiaphragm as before. Heart size and mediastinal contours are within normal limits. No effusion. Visualized bones unremarkable. IMPRESSION: Endotracheal tube tip 3.1 cm above the carina. Electronically Signed   By: Lucrezia Europe M.D.   On: 11/26/2022 08:23   DG CHEST PORT 1 VIEW  Result Date: 11/26/2022 CLINICAL DATA:  Increasing oxygen demand EXAM: PORTABLE CHEST 1 VIEW COMPARISON:  Yesterday FINDINGS: Enteric tube with tip reaching the stomach. Gas distended stomach also seen on prior. Low volume chest with streaky density at the medial right base. Normal heart size and stable mediastinal contours. No effusion or pneumothorax. IMPRESSION: 1. Continued elevation of the left diaphragm with gas distended stomach. Please correlate for enteric tube function, the catheter reaches the stomach. 2. Low volume chest with mild atelectasis. Electronically Signed   By: Jorje Guild M.D.   On: 11/26/2022 05:05   DG Chest Port 1 View  Result Date: 11/25/2022 CLINICAL DATA:  Enteric catheter placement EXAM: PORTABLE CHEST 1 VIEW COMPARISON:  09/15/2022, 11/25/2022 FINDINGS: Frontal view of the chest demonstrates enteric catheter passing below diaphragm, tip excluded  by collimation. Cardiac silhouette is unremarkable. Low lung volumes, with chronic elevation of the left hemidiaphragm. No airspace disease, effusion, or pneumothorax. IMPRESSION: 1. Terri catheter passes below diaphragm, tip excluded by collimation. 2. Low lung volumes. Electronically Signed   By: Sharlet Salina M.D.    On: 11/25/2022 22:11   DG Abd 1 View  Result Date: 11/25/2022 CLINICAL DATA:  Postop ileus EXAM: ABDOMEN - 1 VIEW COMPARISON:  CT 11/22/2022 FINDINGS: Mild gaseous distention of the stomach. Multiple gas dilated small bowel loops in the upper abdomen. Moderate gas and fecal material in the nondilated proximal colon and rectum. IMPRESSION: Multiple gas dilated small bowel loops suggesting ileus or partial obstruction. Electronically Signed   By: Corlis Leak M.D.   On: 11/25/2022 14:36   ECHOCARDIOGRAM COMPLETE  Result Date: 11/25/2022    ECHOCARDIOGRAM REPORT   Patient Name:   Kyle Burns Date of Exam: 11/25/2022 Medical Rec #:  [id number]        Height:       71.0 in Accession #:    [id number]       Weight:       282.9 lb Date of Birth:  1966/11/04        BSA:          2.443 m Patient Age:    56 years         BP:           108/78 mmHg Patient Gender: M                HR:           72 bpm. [facility] Procedure: 2D Echo, Cardiac Doppler, Color Doppler and Intracardiac            Opacification Agent Indications:    Atrial Fibrillaiton  History:        Patient has prior history of Echocardiogram examinations, most                 recent 04/23/2018. COPD, Arrythmias:RBBB,                 Signs/Symptoms:Shortness of Breath; Risk Factors:Hypertension                 and Dyslipidemia.  Sonographer:    Mikki Harbor Referring Phys: [phone] CARLOS MADERA  Sonographer Comments: Patient is obese. Image acquisition challenging due to COPD. IMPRESSIONS  1. Left ventricular ejection fraction, by estimation, is 70 to 75%. The left ventricle has hyperdynamic function. The left ventricle has no regional wall motion abnormalities. There is mild left ventricular hypertrophy. Left ventricular diastolic parameters are consistent with Grade I diastolic dysfunction (impaired relaxation).  2. Right ventricular systolic function is normal. The right ventricular size is normal. Tricuspid regurgitation signal is  inadequate for assessing PA pressure.  3. The mitral valve is grossly normal, mildly calcified. Trivial mitral valve regurgitation.  4. The aortic valve is tricuspid. Aortic valve regurgitation is not visualized. Aortic valve mean gradient measures 4.0 mmHg.  5. Unable to estimate CVP. Comparison(s): No prior Echocardiogram. FINDINGS  Left Ventricle: Left ventricular ejection fraction, by estimation, is 70 to 75%. The left ventricle has hyperdynamic function. The left ventricle has no regional wall motion abnormalities. Definity contrast agent was given IV to delineate the left ventricular endocardial borders. The left ventricular internal cavity size was normal in size. There is mild left ventricular hypertrophy. Left ventricular diastolic parameters are consistent with Grade I diastolic dysfunction (impaired  relaxation). Right Ventricle: The right ventricular size is normal. No increase in right ventricular wall thickness. Right ventricular systolic function is normal. Tricuspid regurgitation signal is inadequate for assessing PA pressure. Left Atrium: Left atrial size was normal in size. Right Atrium: Right atrial size was normal in size. Pericardium: There is no evidence of pericardial effusion. Mitral Valve: The mitral valve is grossly normal. There is mild calcification of the mitral valve leaflet(s). Trivial mitral valve regurgitation. MV peak gradient, 2.7 mmHg. The mean mitral valve gradient is 1.0 mmHg. Tricuspid Valve: The tricuspid valve is grossly normal. Tricuspid valve regurgitation is trivial. Aortic Valve: The aortic valve is tricuspid. There is mild aortic valve annular calcification. Aortic valve regurgitation is not visualized. Aortic valve mean gradient measures 4.0 mmHg. Aortic valve peak gradient measures 9.0 mmHg. Aortic valve area, by  VTI measures 2.57 cm. Pulmonic Valve: The pulmonic valve was not well visualized. Pulmonic valve regurgitation is trivial. Aorta: The aortic root is normal  in size and structure. Venous: Unable to estimate CVP. The inferior vena cava was not well visualized. IAS/Shunts: The interatrial septum was not well visualized.  LEFT VENTRICLE PLAX 2D LVIDd:         4.40 cm   Diastology LVIDs:         2.40 cm   LV e' medial:    6.53 cm/s LV PW:         1.10 cm   LV E/e' medial:  10.0 LV IVS:        1.20 cm   LV e' lateral:   7.29 cm/s LVOT diam:     2.00 cm   LV E/e' lateral: 9.0 LV SV:         79 LV SV Index:   32 LVOT Area:     3.14 cm  RIGHT VENTRICLE RV Basal diam:  3.70 cm RV Mid diam:    3.60 cm RV S prime:     10.00 cm/s TAPSE (M-mode): 2.5 cm LEFT ATRIUM             Index        RIGHT ATRIUM           Index LA diam:        3.30 cm 1.35 cm/m   RA Area:     17.30 cm LA Vol (A2C):   51.0 ml 20.87 ml/m  RA Volume:   51.00 ml  20.87 ml/m LA Vol (A4C):   44.8 ml 18.34 ml/m LA Biplane Vol: 48.1 ml 19.69 ml/m  AORTIC VALVE                    PULMONIC VALVE AV Area (Vmax):    2.72 cm     PV Vmax:       1.47 m/s AV Area (Vmean):   3.18 cm     PV Peak grad:  8.6 mmHg AV Area (VTI):     2.57 cm AV Vmax:           150.00 cm/s AV Vmean:          87.000 cm/s AV VTI:            0.308 m AV Peak Grad:      9.0 mmHg AV Mean Grad:      4.0 mmHg LVOT Vmax:         130.00 cm/s LVOT Vmean:        88.200 cm/s LVOT VTI:  0.252 m LVOT/AV VTI ratio: 0.82  AORTA Ao Root diam: 3.50 cm MITRAL VALVE MV Area (PHT): 3.19 cm    SHUNTS MV Area VTI:   3.15 cm    Systemic VTI:  0.25 m MV Peak grad:  2.7 mmHg    Systemic Diam: 2.00 cm MV Mean grad:  1.0 mmHg MV Vmax:       0.82 m/s MV Vmean:      43.9 cm/s MV Decel Time: 238 msec MV E velocity: 65.50 cm/s MV A velocity: 79.50 cm/s MV E/A ratio:  0.82 Rozann Lesches MD Electronically signed by Rozann Lesches MD Signature Date/Time: 11/25/2022/11:05:16 AM    Final     Anti-infectives: Anti-infectives (From admission, onward)    Start     Dose/Rate Route Frequency Ordered Stop   11/22/22 1400  ceFAZolin (ANCEF) IVPB 3g/100 mL premix         3 g 200 mL/hr over 30 Minutes Intravenous  Once 11/22/22 1202 11/22/22 1245   11/22/22 1208  ceFAZolin (ANCEF) 3-0.9 GM/100ML-% IVPB       Note to Pharmacy: Lear Ng S: cabinet override      11/22/22 1208 11/22/22 1310   11/22/22 1200  ceFAZolin (ANCEF) IVPB 2g/100 mL premix  Status:  Discontinued        2 g 200 mL/hr over 30 Minutes Intravenous Every 8 hours 11/22/22 1149 11/22/22 1202       Assessment/Plan:  Patient is a 56 year old male who was admitted with an incarcerated umbilical hernia and is status post open umbilical hernia repair with mesh on 12/9.  -Patient had multiple episodes of nausea with vomiting overnight, so NG tube was placed.  3 L of brown output have been drained -Chest x-rays this morning demonstrated gastric bubble, so repeat KUB was ordered to evaluate position of NG tube -NG tube continuing to decompress liquid brown output -Patient with stable leukocytosis this morning, 14.6 from 14.3 -IV Zosyn started by overnight hospitalist secondary to concern for possible aspiration event with episodes of vomiting -Cardiology evaluated the patient for A-fib with RVR, and recommended against full dose anticoagulation -Will discuss this with hospitalist, as patient is likely developing a hematoma at his surgical site -NPO -NG tube to LIS -I provided an update to the patient's mother.  I explained that given his multiple episodes of nausea and vomiting overnight, decision was made for NG tube placement.  He underwent the KUB yesterday, which demonstrated dilated loops of bowel, concerning for postoperative ileus.  I explained that patients can develop an ileus after surgery, especially when there was concern for obstructive pathology prior to surgery.  Treatment for him will continue to be supportive, as we need to give him time to recover from this ileus. -Appreciate hospitalist recommendations   LOS: 4 days    Kelly Eisler A Claire Dolores 11/26/2022

## 2022-11-27 ENCOUNTER — Inpatient Hospital Stay (HOSPITAL_COMMUNITY): Payer: No Typology Code available for payment source

## 2022-11-27 ENCOUNTER — Inpatient Hospital Stay: Payer: Self-pay

## 2022-11-27 DIAGNOSIS — K9189 Other postprocedural complications and disorders of digestive system: Secondary | ICD-10-CM | POA: Diagnosis not present

## 2022-11-27 DIAGNOSIS — K420 Umbilical hernia with obstruction, without gangrene: Secondary | ICD-10-CM | POA: Diagnosis not present

## 2022-11-27 DIAGNOSIS — I480 Paroxysmal atrial fibrillation: Secondary | ICD-10-CM | POA: Diagnosis not present

## 2022-11-27 DIAGNOSIS — K91870 Postprocedural hematoma of a digestive system organ or structure following a digestive system procedure: Secondary | ICD-10-CM | POA: Diagnosis not present

## 2022-11-27 LAB — CBC
HCT: 38 % — ABNORMAL LOW (ref 39.0–52.0)
Hemoglobin: 12.8 g/dL — ABNORMAL LOW (ref 13.0–17.0)
MCH: 28.8 pg (ref 26.0–34.0)
MCHC: 33.7 g/dL (ref 30.0–36.0)
MCV: 85.4 fL (ref 80.0–100.0)
Platelets: 366 10*3/uL (ref 150–400)
RBC: 4.45 MIL/uL (ref 4.22–5.81)
RDW: 13.3 % (ref 11.5–15.5)
WBC: 13.2 10*3/uL — ABNORMAL HIGH (ref 4.0–10.5)
nRBC: 0 % (ref 0.0–0.2)

## 2022-11-27 LAB — BASIC METABOLIC PANEL
Anion gap: 11 (ref 5–15)
BUN: 78 mg/dL — ABNORMAL HIGH (ref 6–20)
CO2: 26 mmol/L (ref 22–32)
Calcium: 8.1 mg/dL — ABNORMAL LOW (ref 8.9–10.3)
Chloride: 93 mmol/L — ABNORMAL LOW (ref 98–111)
Creatinine, Ser: 2.89 mg/dL — ABNORMAL HIGH (ref 0.61–1.24)
GFR, Estimated: 24 mL/min — ABNORMAL LOW (ref >60)
Glucose, Bld: 140 mg/dL — ABNORMAL HIGH (ref 70–99)
Potassium: 3.1 mmol/L — ABNORMAL LOW (ref 3.5–5.1)
Sodium: 130 mmol/L — ABNORMAL LOW (ref 135–145)

## 2022-11-27 LAB — GLUCOSE, CAPILLARY
Glucose-Capillary: 127 mg/dL — ABNORMAL HIGH (ref 70–99)
Glucose-Capillary: 127 mg/dL — ABNORMAL HIGH (ref 70–99)
Glucose-Capillary: 129 mg/dL — ABNORMAL HIGH (ref 70–99)
Glucose-Capillary: 129 mg/dL — ABNORMAL HIGH (ref 70–99)
Glucose-Capillary: 135 mg/dL — ABNORMAL HIGH (ref 70–99)
Glucose-Capillary: 137 mg/dL — ABNORMAL HIGH (ref 70–99)
Glucose-Capillary: 147 mg/dL — ABNORMAL HIGH (ref 70–99)

## 2022-11-27 LAB — MAGNESIUM: Magnesium: 3.8 mg/dL — ABNORMAL HIGH (ref 1.7–2.4)

## 2022-11-27 LAB — TRIGLYCERIDES: Triglycerides: 241 mg/dL — ABNORMAL HIGH (ref <150)

## 2022-11-27 MED ORDER — SODIUM CHLORIDE 0.9 % IV SOLN: INTRAVENOUS | Status: AC

## 2022-11-27 MED ORDER — SODIUM CHLORIDE 0.9% FLUSH
10.0000 mL | Freq: Two times a day (BID) | INTRAVENOUS | Status: DC
  Administered 2022-11-27 – 2022-12-07 (×20): 10 mL

## 2022-11-27 MED ORDER — ENOXAPARIN SODIUM 60 MG/0.6ML IJ SOSY
60.0000 mg | PREFILLED_SYRINGE | INTRAMUSCULAR | Status: DC
  Administered 2022-11-28 – 2022-12-07 (×10): 60 mg via SUBCUTANEOUS
  Filled 2022-11-27 (×11): qty 0.6

## 2022-11-27 MED ORDER — SODIUM CHLORIDE 0.9 % IV BOLUS
500.0000 mL | Freq: Once | INTRAVENOUS | Status: AC
  Administered 2022-11-27: 500 mL via INTRAVENOUS

## 2022-11-27 MED ORDER — PIPERACILLIN-TAZOBACTAM 3.375 G IVPB
3.3750 g | Freq: Three times a day (TID) | INTRAVENOUS | Status: AC
  Administered 2022-11-27 – 2022-12-04 (×21): 3.375 g via INTRAVENOUS
  Filled 2022-11-27 (×21): qty 50

## 2022-11-27 MED ORDER — SODIUM CHLORIDE 0.9 % IV BOLUS
1000.0000 mL | Freq: Once | INTRAVENOUS | Status: AC
  Administered 2022-11-27: 1000 mL via INTRAVENOUS

## 2022-11-27 MED ORDER — SODIUM CHLORIDE 0.9% FLUSH: 10.0000 mL | INTRAVENOUS | Status: DC | PRN

## 2022-11-27 MED ORDER — POTASSIUM CHLORIDE 10 MEQ/100ML IV SOLN
10.0000 meq | INTRAVENOUS | Status: AC
  Administered 2022-11-27 (×4): 10 meq via INTRAVENOUS
  Filled 2022-11-27 (×4): qty 100

## 2022-11-27 NOTE — Progress Notes (Signed)
Initial Nutrition Assessment  DOCUMENTATION CODES:   Obesity unspecified  INTERVENTION:  RD will continue to follow pt progression and make further recommendations   NUTRITION DIAGNOSIS:   Inadequate oral intake related to inability to eat as evidenced by NPO status.  GOAL:  Provide needs based on ASPEN/SCCM guidelines  MONITOR:  Vent status, Labs, I & O's, Weight trends  REASON FOR ASSESSMENT:   Ventilator    ASSESSMENT: Patient is a 56 yo male who presents with hx of HTN, asthma, back pain, GERD. Presents with abdominal pain.   12/9- umbilical hernia repair with mesh placement.   Patient intubated 2/13 on ventilator support. Requiring pressor support.  MAP-77   Per surgery hopeful to extubate in 1-2 days. Patient eating well prior to surgery per his mother who is bedside. Small bowel obstruction. NGT with 2 liters OP past 24 hr per MD. Talked with nursing.   MV: 12.4  L/min Temp (24hrs), Avg:98.8 F (37.1 C), Min:97.6 F (36.4 C), Max:99.8 F (37.7 C)  Propofol: 30.2 ml/hr (797 kcal)   Intake/Output Summary (Last 24 hours) at 11/27/2022 1403 Last data filed at 11/27/2022 1322 Gross per 24 hour  Intake 5783.13 ml  Output 1850 ml  Net 3933.13 ml   Since admission: +3.633 Liters  CBG (last 3)  Recent Labs    11/27/22 0358 11/27/22 0758 11/27/22 1126  GLUCAP 137* 135* 129*  129*       Latest Ref Rng & Units 11/27/2022    4:32 AM 11/26/2022    3:42 AM 11/25/2022    4:22 AM  BMP  Glucose 70 - 99 mg/dL 509  326  712   BUN 6 - 20 mg/dL 78  64  38   Creatinine 0.61 - 1.24 mg/dL 4.58  0.99  8.33   Sodium 135 - 145 mmol/L 130  137  139   Potassium 3.5 - 5.1 mmol/L 3.1  3.0  3.5   Chloride 98 - 111 mmol/L 93  94  100   CO2 22 - 32 mmol/L 26  28  26    Calcium 8.9 - 10.3 mg/dL 8.1  8.9  9.5       NUTRITION - FOCUSED PHYSICAL EXAM:  Flowsheet Row Most Recent Value  Orbital Region No depletion  Upper Arm Region No depletion  Thoracic and Lumbar Region  No depletion  Buccal Region No depletion  Temple Region No depletion  Clavicle Bone Region No depletion  Clavicle and Acromion Bone Region No depletion  Scapular Bone Region Unable to assess  Dorsal Hand Unable to assess  [green mitts]  Patellar Region No depletion  Anterior Thigh Region No depletion  Posterior Calf Region No depletion  Edema (RD Assessment) Moderate  Hair --  head]  Eyes Unable to assess  Mouth Unable to assess  [intubated]  Skin Reviewed  Nails Unable to assess  [has green mitts on]       Diet Order:   Diet Order             Diet NPO time specified  Diet effective now                   EDUCATION NEEDS:  Not appropriate for education at this time  Skin:  Skin Assessment: Skin Integrity Issues: Skin Integrity Issues:: Incisions Incisions: closed -abdomen  Last BM:  12/14 small type 7- distended abdomen (suspected ileus)  Height:   Ht Readings from Last 1 Encounters:  11/27/22 5\' 11"  (1.803 m)  Weight:   Wt Readings from Last 1 Encounters:  11/27/22 129.7 kg    Ideal Body Weight:   78 kg  BMI:  Body mass index is 39.88 kg/m.  Estimated Nutritional Needs:   Kcal:  2400  Protein:  150-160 gr  Fluid:  >2 liters daily  Colman Cater MS,RD,CSG,LDN Contact: Shea Evans

## 2022-11-27 NOTE — Progress Notes (Signed)
NAME:  Kyle Burns, MRN:  [id number], DOB:  1965-12-29, LOS: 5 ADMISSION DATE:  11/22/2022, CONSULTATION DATE:  11/27/2022  REFERRING MD:  Lanice Shirts, CHIEF COMPLAINT: Respiratory failure  History of Present Illness:  56 year old man with OSA/OHS admitted for small bowel obstruction due to incarcerated umbilical hernia.  He underwent open umbilical hernia repair with mesh placement.  Postop day 3 he developed abdominal distention and atrial fibrillation/RVR.  NG tube was placed and 3 L of fluid aspirated.  He required IV Cardizem and then amiodarone bolus and drip also digoxin 0.25 mg IV.  He developed dyspnea and was started on IV Zosyn due to concern for aspiration. 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 12/13 ETT >>  Interim History / Subjective:   Remains critically ill. On low-dose Levophed. Sedated with propofol and fentanyl drip added. Remains on amiodarone drip Urine output improved to 1 L  Objective   Blood pressure 110/73, pulse 73, temperature 98.7 F (37.1 C), temperature source Axillary, resp. rate (!) 22, height 5\' 11"  (1.803 m), weight 129.7 kg, SpO2 96 %.    Vent Mode: PRVC FiO2 (%):  [50 %-100 %] 50 % Set Rate:  [20 bmp] 20 bmp Vt Set:  [550 mL] 550 mL PEEP:  [5 cmH20] 5 cmH20 Plateau Pressure:  [20 cmH20] 20 cmH20   Intake/Output Summary (Last 24 hours) at 11/27/2022 1325 Last  data filed at 11/27/2022 1322 Gross per 24 hour  Intake 5783.13 ml  Output 1850 ml  Net 3933.13 ml    Filed Weights   11/24/22 1238 11/26/22 0415 11/27/22 0402  Weight: 128.3 kg 125.7 kg 129.7 kg    Examination: General: Obese man, orally intubated, sedated, no distress HENT: No pallor, icterus, no JVD Lungs: Decreased breath sounds bilateral, no accessory muscle use Cardiovascular: S1-S2 distant Abdomen: Distended, bowel sounds ++, no guarding, ecchymosis around umbilicus Extremities: No deformity, no edema Neuro: Sedate, RASS -1   Labs show mild leukocytosis, hypokalemia, slight increase in BUN/creatinine to 78/2.9 X-ray abdomen shows large gastric bubble with dilated small bowel loops Chest x-ray dependently reviewed shows ET tube in position, elevated left hemidiaphragm, right lower lobe atelectasis without clear infiltrate  Resolved Hospital Problem list     Assessment & Plan:  Acute respiratory failure with hypoxia , related to abdominal distention Underlying OSA/OHS Known left hemidiaphragm paralysis  -Continue low tidal volume ventilation -Hold off spontaneous breathing trials until abdominal distention improves -Some concern for aspiration yesterday and started on Zosyn, respiratory culture showing Serratia, will simplify based on sensitivity -provide tracheobronchial toilet while intubated  Septic shock -Related to abdominal issues and sedation medications/propofol -Use Levophed PIV -Attempt to lower propofol for goal RASS -1  -At the same time minimize fentanyl use due to ileus  AKI -related to above Supplement hypokalemia aggressively. Normal saline at 100 cc an hour Trend BMET and urine output  Atrial fibrillation/RVR -converted to sinus rhythm -Related to respiratory worsening -  Echo shows normal LVEF, hyperdynamic, grade 1 diastolic dysfunction Continue IV amiodarone , cardiology following  Small bowel obstruction vs post op ileus/incarcerated  hernia repair -Surgery following, NG tube to LIS -Will need CT abdomen to clarify need for surgical intervention here -May need TNA soon  Best Practice (right click and "Reselect all SmartList Selections" daily)   Diet/type: NPO DVT prophylaxis: prophylactic heparin  GI prophylaxis: PPI Lines: N/A Foley:  Yes, and it is still needed Code Status:  full code Last date of multidisciplinary goals of care discussion [mother at bedside]  Labs   CBC: Recent Labs  Lab 11/22/22 0607 11/22/22 0635 11/23/22 0346 11/24/22 0354 11/25/22 0422 11/26/22 0925 11/27/22 0432  WBC 14.5*  --  14.9* 10.2 14.3* 14.6* 13.2*  NEUTROABS 12.2*  --   --   --   --  10.3*  --   HGB 14.9   < > 14.8 14.6 15.9 14.8 12.8*  HCT 46.0   < > 45.0 44.3 48.6 44.0 38.0*  MCV 88.6  --  89.6 87.9 88.0 86.3 85.4  PLT 323  --  359 322 398 409* 366   < > = values in this interval not displayed.     Basic Metabolic Panel: Recent Labs  Lab 11/22/22 0607 11/22/22 0635 11/23/22 0346 11/25/22 0422 11/26/22 0342 11/27/22 0432  NA 139 141 137 139 137 130*  K 3.7 3.9 3.7 3.5 3.0* 3.1*  CL 105 101 101 100 94* 93*  CO2 28  --  29 26 28 26   GLUCOSE 139* 139* 121* 162* 167* 140*  BUN 21* 21* 17 38* 64* 78*  CREATININE 1.03 1.10 1.06 1.58* 2.66* 2.89*  CALCIUM 9.4  --  8.8* 9.5 8.9 8.1*  MG  --   --   --  2.9* 3.7* 3.8*    GFR: Estimated Creatinine Clearance: 38.3 mL/min (A) (by C-G formula based on SCr of 2.89 mg/dL (H)). Recent Labs  Lab 11/24/22 0354 11/25/22 0422 11/26/22 0925 11/27/22 0432  WBC 10.2 14.3* 14.6* 13.2*     Liver Function Tests: Recent Labs  Lab 11/22/22 0607  AST 24  ALT 25  ALKPHOS 89  BILITOT 0.7  PROT 8.0  ALBUMIN 4.5    Recent Labs  Lab 11/22/22 0607  LIPASE 45    No results for input(s): "AMMONIA" in the last 168 hours.  ABG    Component Value Date/Time   PHART 7.45 11/26/2022 0859   PCO2ART 43 11/26/2022 0859   PO2ART 103 11/26/2022 0859   HCO3 29.9 (H)  11/26/2022 0859   TCO2 28 11/22/2022 0635   O2SAT 97.6 11/26/2022 0859     Coagulation Profile: No results for input(s): "INR", "PROTIME" in the last 168 hours.  Cardiac Enzymes: No results for input(s): "CKTOTAL", "CKMB", "CKMBINDEX", "TROPONINI" in the last 168 hours.  HbA1C: HB A1C (BAYER DCA - WAIVED)  Date/Time Value Ref Range Status  11/04/2021 04:18 PM 5.7 (H) 4.8 - 5.6 % Final    Comment:             Prediabetes: 5.7 - 6.4          Diabetes: >6.4          Glycemic control for adults with diabetes: <7.0     CBG: Recent Labs  Lab 11/26/22 1926 11/27/22 0009 11/27/22 0358 11/27/22 0758 11/27/22 1126  GLUCAP 133* 147* 137* 135* 129*  129*    Critical care time: 35 minutes     11/29/22 MD. FCCP.  [facility] Pager : [phone]  If no response to pager , please call [phone] until 7 pm After 7:00 pm call Elink  [phone]   11/27/2022  .

## 2022-11-27 NOTE — Progress Notes (Signed)
Pharmacy Antibiotic Note  Kyle Burns is a 56 y.o. male admitted on 11/22/2022 with aspiration pneumonia.  Pharmacy has been consulted for zosyn dosing.  Plan: Zosyn 3.375g IV q8h (4 hour infusion). F/u cxs and clinical progress Monitor V/S, labs  Height: 5\' 11"  (180.3 cm) Weight: 129.7 kg (285 lb 15 oz) IBW/kg (Calculated) : 75.3  Temp (24hrs), Avg:98.8 F (37.1 C), Min:97.6 F (36.4 C), Max:99.8 F (37.7 C)  Recent Labs  Lab 11/22/22 0635 11/23/22 0346 11/24/22 0354 11/25/22 0422 11/26/22 0342 11/26/22 0925 11/27/22 0432  WBC  --  14.9* 10.2 14.3*  --  14.6* 13.2*  CREATININE 1.10 1.06  --  1.58* 2.66*  --  2.89*    Estimated Creatinine Clearance: 38.3 mL/min (A) (by C-G formula based on SCr of 2.89 mg/dL (H)).    No Known Allergies  Antimicrobials this admission: zosyn 12/14 >>   Microbiology results: 12/13 RespCx: Serratia Marsecens  Thank you for allowing pharmacy to be a part of this patient's care.  1/14, BS Pharm D, BCPS Clinical Pharmacist 11/27/2022 3:25 PM

## 2022-11-27 NOTE — Progress Notes (Signed)
Progress Note  Patient Name: Kyle Burns Date of Encounter: 11/27/2022  Primary Cardiologist: New to [facility]  Subjective   Patient remains sedated on the ventilator.  Inpatient Medications    Scheduled Meds:  bisacodyl  10 mg Rectal Daily   Chlorhexidine Gluconate Cloth  6 each Topical Daily   docusate  100 mg Per Tube BID   enoxaparin (LOVENOX) injection  0.5 mg/kg Subcutaneous Q24H   fluticasone  1 spray Each Nare Daily   montelukast  10 mg Per Tube QHS    morphine injection  2 mg Intravenous Once   mouth rinse  15 mL Mouth Rinse Q2H   pantoprazole (PROTONIX) IV  40 mg Intravenous Q12H   pneumococcal 23 valent vaccine  0.5 mL Intramuscular Tomorrow-1000   senna-docusate  2 tablet Per Tube BID   Continuous Infusions:  sodium chloride     sodium chloride 999 mL/hr at 11/27/22 0849   amiodarone 30 mg/hr (11/27/22 0849)   fentaNYL infusion INTRAVENOUS 100 mcg/hr (11/27/22 0849)   norepinephrine (LEVOPHED) Adult infusion 8 mcg/min (11/27/22 0849)   potassium chloride 10 mEq (11/27/22 0914)   propofol (DIPRIVAN) infusion 30 mcg/kg/min (11/27/22 0849)   PRN Meds: alum & mag hydroxide-simeth, fentaNYL, hydrALAZINE, levalbuterol, midazolam, morphine injection, ondansetron (ZOFRAN) IV, mouth rinse, oxyCODONE, phenol, prochlorperazine   Vital Signs    Vitals:   11/27/22 0830 11/27/22 0845 11/27/22 0900 11/27/22 0915  BP: (!) 99/54 (!) 104/59 (!) 97/58 (!) 94/56  Pulse: 68 64 66 66  Resp: (!) 23 (!) 22 (!) 22 (!) 22  Temp:      TempSrc:      SpO2: 96% 97% 97% 97%  Weight:      Height:        Intake/Output Summary (Last 24 hours) at 11/27/2022 0933 Last data filed at 11/27/2022 0849 Gross per 24 hour  Intake 4813.87 ml  Output 3050 ml  Net 1763.87 ml   Filed Weights   11/24/22 1238 11/26/22 0415 11/27/22 0402  Weight: 128.3 kg 125.7 kg 129.7 kg    Telemetry    Sinus rhythm.  Personally reviewed.  ECG    An ECG dated 11/25/2022 was  personally reviewed today and demonstrated:  Atrial fibrillation with RVR and right bundle wrench block.  Physical Exam   GEN: No acute distress.   Neck: Difficult to assess JVP. Cardiac: RRR, no murmur, rub, or gallop.  Respiratory: Nonlabored. Clear to auscultation bilaterally. GI: Distended with minimal bowel sounds. MS: No edema. Neuro: Currently sedated.  Labs    Chemistry Recent Labs  Lab 11/22/22 [phone] 11/22/22 0635 11/25/22 0422 11/26/22 0342 11/27/22 0432  NA 139   < > 139 137 130*  K 3.7   < > 3.5 3.0* 3.1*  CL 105   < > 100 94* 93*  CO2 28   < > 26 28 26   GLUCOSE 139*   < > 162* 167* 140*  BUN 21*   < > 38* 64* 78*  CREATININE 1.03   < > 1.58* 2.66* 2.89*  CALCIUM 9.4   < > 9.5 8.9 8.1*  PROT 8.0  --   --   --   --   ALBUMIN 4.5  --   --   --   --   AST 24  --   --   --   --   ALT 25  --   --   --   --   ALKPHOS 89  --   --   --   --  BILITOT 0.7  --   --   --   --   GFRNONAA >60   < > 50* 27* 24*  ANIONGAP 6   < > 13 15 11    < > = values in this interval not displayed.     Hematology Recent Labs  Lab 11/25/22 0422 11/26/22 0925 11/27/22 0432  WBC 14.3* 14.6* 13.2*  RBC 5.52 5.10 4.45  HGB 15.9 14.8 12.8*  HCT 48.6 44.0 38.0*  MCV 88.0 86.3 85.4  MCH 28.8 29.0 28.8  MCHC 32.7 33.6 33.7  RDW 13.2 13.2 13.3  PLT 398 409* 366    Cardiac Enzymes Recent Labs  Lab 11/24/22 1948 11/24/22 2141  TROPONINIHS 10 9   Radiology    [place] EKG SITE RITE  Result Date: 11/27/2022 If Site Rite image not attached, placement could not be confirmed due to current cardiac rhythm.  DG Abd Portable 1V  Result Date: 11/26/2022 CLINICAL DATA:  NG tube placement. EXAM: PORTABLE ABDOMEN - 1 VIEW COMPARISON:  11/25/2022 FINDINGS: The NG tube tip is in the body region of the stomach. Improved bowel gas pattern with less distention of the small bowel. IMPRESSION: NG tube tip is in the body region of the stomach. Electronically Signed   By: Marijo Sanes M.D.   On:  11/26/2022 11:08   DG Chest Portable 1 View  Result Date: 11/26/2022 CLINICAL DATA:  Et tube placement EXAM: PORTABLE CHEST - 1 VIEW COMPARISON:  Earlier film of the same day FINDINGS: Endotracheal tube has been placed, tip 3.1 cm above carina. Nasogastric tube loops in the stomach which is incompletely decompressed. Low lung volumes with linear scarring or atelectasis at the right lung base as before. Elevated left hemidiaphragm as before. Heart size and mediastinal contours are within normal limits. No effusion. Visualized bones unremarkable. IMPRESSION: Endotracheal tube tip 3.1 cm above the carina. Electronically Signed   By: Lucrezia Europe M.D.   On: 11/26/2022 08:23   DG CHEST PORT 1 VIEW  Result Date: 11/26/2022 CLINICAL DATA:  Increasing oxygen demand EXAM: PORTABLE CHEST 1 VIEW COMPARISON:  Yesterday FINDINGS: Enteric tube with tip reaching the stomach. Gas distended stomach also seen on prior. Low volume chest with streaky density at the medial right base. Normal heart size and stable mediastinal contours. No effusion or pneumothorax. IMPRESSION: 1. Continued elevation of the left diaphragm with gas distended stomach. Please correlate for enteric tube function, the catheter reaches the stomach. 2. Low volume chest with mild atelectasis. Electronically Signed   By: Jorje Guild M.D.   On: 11/26/2022 05:05   DG Chest Port 1 View  Result Date: 11/25/2022 CLINICAL DATA:  Enteric catheter placement EXAM: PORTABLE CHEST 1 VIEW COMPARISON:  09/15/2022, 11/25/2022 FINDINGS: Frontal view of the chest demonstrates enteric catheter passing below diaphragm, tip excluded by collimation. Cardiac silhouette is unremarkable. Low lung volumes, with chronic elevation of the left hemidiaphragm. No airspace disease, effusion, or pneumothorax. IMPRESSION: 1. Terri catheter passes below diaphragm, tip excluded by collimation. 2. Low lung volumes. Electronically Signed   By: Randa Ngo M.D.   On: 11/25/2022  22:11   DG Abd 1 View  Result Date: 11/25/2022 CLINICAL DATA:  Postop ileus EXAM: ABDOMEN - 1 VIEW COMPARISON:  CT 11/22/2022 FINDINGS: Mild gaseous distention of the stomach. Multiple gas dilated small bowel loops in the upper abdomen. Moderate gas and fecal material in the nondilated proximal colon and rectum. IMPRESSION: Multiple gas dilated small bowel loops suggesting ileus or partial obstruction. Electronically  Signed   By: Lucrezia Europe M.D.   On: 11/25/2022 14:36   ECHOCARDIOGRAM COMPLETE  Result Date: 11/25/2022    ECHOCARDIOGRAM REPORT   Patient Name:   ILIJAH FRAPPIER Date of Exam: 11/25/2022 Medical Rec #:  [id number]        Height:       71.0 in Accession #:    [id number]       Weight:       282.9 lb Date of Birth:  1966-05-17        BSA:          2.443 m Patient Age:    55 years         BP:           108/78 mmHg Patient Gender: M                HR:           72 bpm. Exam Location:  [facility] Procedure: 2D Echo, Cardiac Doppler, Color Doppler and Intracardiac            Opacification Agent Indications:    Atrial Fibrillaiton  History:        Patient has prior history of Echocardiogram examinations, most                 recent 04/23/2018. COPD, Arrythmias:RBBB,                 Signs/Symptoms:Shortness of Breath; Risk Factors:Hypertension                 and Dyslipidemia.  Sonographer:    Wenda Low Referring Phys: Osgood  Sonographer Comments: Patient is obese. Image acquisition challenging due to COPD. IMPRESSIONS  1. Left ventricular ejection fraction, by estimation, is 70 to 75%. The left ventricle has hyperdynamic function. The left ventricle has no regional wall motion abnormalities. There is mild left ventricular hypertrophy. Left ventricular diastolic parameters are consistent with Grade I diastolic dysfunction (impaired relaxation).  2. Right ventricular systolic function is normal. The right ventricular size is normal. Tricuspid regurgitation signal is inadequate for  assessing PA pressure.  3. The mitral valve is grossly normal, mildly calcified. Trivial mitral valve regurgitation.  4. The aortic valve is tricuspid. Aortic valve regurgitation is not visualized. Aortic valve mean gradient measures 4.0 mmHg.  5. Unable to estimate CVP. Comparison(s): No prior Echocardiogram. FINDINGS  Left Ventricle: Left ventricular ejection fraction, by estimation, is 70 to 75%. The left ventricle has hyperdynamic function. The left ventricle has no regional wall motion abnormalities. Definity contrast agent was given IV to delineate the left ventricular endocardial borders. The left ventricular internal cavity size was normal in size. There is mild left ventricular hypertrophy. Left ventricular diastolic parameters are consistent with Grade I diastolic dysfunction (impaired relaxation). Right Ventricle: The right ventricular size is normal. No increase in right ventricular wall thickness. Right ventricular systolic function is normal. Tricuspid regurgitation signal is inadequate for assessing PA pressure. Left Atrium: Left atrial size was normal in size. Right Atrium: Right atrial size was normal in size. Pericardium: There is no evidence of pericardial effusion. Mitral Valve: The mitral valve is grossly normal. There is mild calcification of the mitral valve leaflet(s). Trivial mitral valve regurgitation. MV peak gradient, 2.7 mmHg. The mean mitral valve gradient is 1.0 mmHg. Tricuspid Valve: The tricuspid valve is grossly normal. Tricuspid valve regurgitation is trivial. Aortic Valve: The aortic valve is tricuspid. There is mild aortic valve annular calcification.  Aortic valve regurgitation is not visualized. Aortic valve mean gradient measures 4.0 mmHg. Aortic valve peak gradient measures 9.0 mmHg. Aortic valve area, by  VTI measures 2.57 cm. Pulmonic Valve: The pulmonic valve was not well visualized. Pulmonic valve regurgitation is trivial. Aorta: The aortic root is normal in size and  structure. Venous: Unable to estimate CVP. The inferior vena cava was not well visualized. IAS/Shunts: The interatrial septum was not well visualized.  LEFT VENTRICLE PLAX 2D LVIDd:         4.40 cm   Diastology LVIDs:         2.40 cm   LV e' medial:    6.53 cm/s LV PW:         1.10 cm   LV E/e' medial:  10.0 LV IVS:        1.20 cm   LV e' lateral:   7.29 cm/s LVOT diam:     2.00 cm   LV E/e' lateral: 9.0 LV SV:         79 LV SV Index:   32 LVOT Area:     3.14 cm  RIGHT VENTRICLE RV Basal diam:  3.70 cm RV Mid diam:    3.60 cm RV S prime:     10.00 cm/s TAPSE (M-mode): 2.5 cm LEFT ATRIUM             Index        RIGHT ATRIUM           Index LA diam:        3.30 cm 1.35 cm/m   RA Area:     17.30 cm LA Vol (A2C):   51.0 ml 20.87 ml/m  RA Volume:   51.00 ml  20.87 ml/m LA Vol (A4C):   44.8 ml 18.34 ml/m LA Biplane Vol: 48.1 ml 19.69 ml/m  AORTIC VALVE                    PULMONIC VALVE AV Area (Vmax):    2.72 cm     PV Vmax:       1.47 m/s AV Area (Vmean):   3.18 cm     PV Peak grad:  8.6 mmHg AV Area (VTI):     2.57 cm AV Vmax:           150.00 cm/s AV Vmean:          87.000 cm/s AV VTI:            0.308 m AV Peak Grad:      9.0 mmHg AV Mean Grad:      4.0 mmHg LVOT Vmax:         130.00 cm/s LVOT Vmean:        88.200 cm/s LVOT VTI:          0.252 m LVOT/AV VTI ratio: 0.82  AORTA Ao Root diam: 3.50 cm MITRAL VALVE MV Area (PHT): 3.19 cm    SHUNTS MV Area VTI:   3.15 cm    Systemic VTI:  0.25 m MV Peak grad:  2.7 mmHg    Systemic Diam: 2.00 cm MV Mean grad:  1.0 mmHg MV Vmax:       0.82 m/s MV Vmean:      43.9 cm/s MV Decel Time: 238 msec MV E velocity: 65.50 cm/s MV A velocity: 79.50 cm/s MV E/A ratio:  0.82 Nona Dell MD Electronically signed by Nona Dell MD Signature Date/Time: 11/25/2022/11:05:16 AM    Final  Assessment & Plan    1.  Postoperative paroxysmal atrial fibrillation with RVR, CHA2DS2-VASc score is 1.  He remains in sinus rhythm on IV amiodarone and is not anticoagulated at  this time.  LVEF 70 to 75% with normal left atrial chamber size.  2.  Acute hypoxemic respiratory failure with baseline history of OSA/OHS and left hemidiaphragm paralysis.  Remains sedated on the ventilator with follow-up by pulmonary/CCM.  3.  Incarcerated umbilical hernia status post open umbilical hernia repair with mesh on December 9.  He has had postoperative ileus with worsening distention, NG tube placed with continued aspirates.  Surgical service still following.  May need to consider follow-up abdominal and pelvic CT.  4.  History of essential hypertension, presently hypotensive and requiring pressor support.  Being managed for possible sepsis per CCM, has been on Levophed.  Sedation and abdominal distention are likely contributors as well.  Discussed with nursing as well as patient's mother in the room.  Would continue IV amiodarone for rhythm suppression at this point.  No clear indication to start anticoagulation as yet particularly as clinical situation evolves.  We will continue to follow.  Signed, Rozann Lesches, MD  11/27/2022, 9:33 AM

## 2022-11-27 NOTE — Progress Notes (Signed)
[facility] Progress Note  5 Days Post-Op  Subjective: Patient seen and examined.  He remains intubated and sedated in the ICU.  He requires propofol and IV fentanyl drip, as he was dyssynchronous with the ventilator yesterday.  He is on 8 of Levophed this morning.  He had a small mucousy bowel movement overnight.  He has had 2 L of NG tube output in the last 24 hours.  Objective: Vital signs in last 24 hours: Temp:  [97.6 F (36.4 C)-99.8 F (37.7 C)] 98.7 F (37.1 C) (12/14 1129) Pulse Rate:  [64-83] 66 (12/14 1100) Resp:  [20-28] 22 (12/14 1100) BP: (83-113)/(49-68) 113/68 (12/14 1100) SpO2:  [95 %-100 %] 95 % (12/14 1100) FiO2 (%):  [60 %-100 %] 60 % (12/14 0845) Weight:  [129.7 kg] 129.7 kg (12/14 0402) Last BM Date : 11/25/22  Intake/Output from previous day: 12/13 0701 - 12/14 0700 In: 4753.7 [I.V.:4141.3; IV Piggyback:532.3] Out: 3050 [Urine:1050; Emesis/NG output:2000] Intake/Output this shift: Total I/O In: 2424.7 [I.V.:1259.3; IV Piggyback:1165.4] Out: 400 [Urine:400]  General appearance: Intubated and sedated in ICU Nose: NG tube in place with feculent output in canister GI: Abdomen soft, mild distention which is improved slightly from yesterday, no percussion tenderness, nontender to palpation; no rigidity, guarding, rebound tenderness; palpable seroma versus hematoma at surgical site, ecchymosis around surgical site, skin glue overlying incision  Lab Results:  Recent Labs    11/26/22 0925 11/27/22 0432  WBC 14.6* 13.2*  HGB 14.8 12.8*  HCT 44.0 38.0*  PLT 409* 366   BMET Recent Labs    11/26/22 0342 11/27/22 0432  NA 137 130*  K 3.0* 3.1*  CL 94* 93*  CO2 28 26  GLUCOSE 167* 140*  BUN 64* 78*  CREATININE 2.66* 2.89*  CALCIUM 8.9 8.1*   PT/INR No results for input(s): "LABPROT", "INR" in the last 72 hours.  Studies/Results: [place] EKG SITE RITE  Result Date: 11/27/2022 If Site Rite image not attached, placement could not be  confirmed due to current cardiac rhythm.  DG Abd Portable 1V  Result Date: 11/26/2022 CLINICAL DATA:  NG tube placement. EXAM: PORTABLE ABDOMEN - 1 VIEW COMPARISON:  11/25/2022 FINDINGS: The NG tube tip is in the body region of the stomach. Improved bowel gas pattern with less distention of the small bowel. IMPRESSION: NG tube tip is in the body region of the stomach. Electronically Signed   By: Rudie Meyer M.D.   On: 11/26/2022 11:08   DG Chest Portable 1 View  Result Date: 11/26/2022 CLINICAL DATA:  Et tube placement EXAM: PORTABLE CHEST - 1 VIEW COMPARISON:  Earlier film of the same day FINDINGS: Endotracheal tube has been placed, tip 3.1 cm above carina. Nasogastric tube loops in the stomach which is incompletely decompressed. Low lung volumes with linear scarring or atelectasis at the right lung base as before. Elevated left hemidiaphragm as before. Heart size and mediastinal contours are within normal limits. No effusion. Visualized bones unremarkable. IMPRESSION: Endotracheal tube tip 3.1 cm above the carina. Electronically Signed   By: Corlis Leak M.D.   On: 11/26/2022 08:23   DG CHEST PORT 1 VIEW  Result Date: 11/26/2022 CLINICAL DATA:  Increasing oxygen demand EXAM: PORTABLE CHEST 1 VIEW COMPARISON:  Yesterday FINDINGS: Enteric tube with tip reaching the stomach. Gas distended stomach also seen on prior. Low volume chest with streaky density at the medial right base. Normal heart size and stable mediastinal contours. No effusion or pneumothorax. IMPRESSION: 1. Continued elevation of the left  diaphragm with gas distended stomach. Please correlate for enteric tube function, the catheter reaches the stomach. 2. Low volume chest with mild atelectasis. Electronically Signed   By: Jorje Guild M.D.   On: 11/26/2022 05:05   DG Chest Port 1 View  Result Date: 11/25/2022 CLINICAL DATA:  Enteric catheter placement EXAM: PORTABLE CHEST 1 VIEW COMPARISON:  09/15/2022, 11/25/2022 FINDINGS:  Frontal view of the chest demonstrates enteric catheter passing below diaphragm, tip excluded by collimation. Cardiac silhouette is unremarkable. Low lung volumes, with chronic elevation of the left hemidiaphragm. No airspace disease, effusion, or pneumothorax. IMPRESSION: 1. Terri catheter passes below diaphragm, tip excluded by collimation. 2. Low lung volumes. Electronically Signed   By: Randa Ngo M.D.   On: 11/25/2022 22:11   DG Abd 1 View  Result Date: 11/25/2022 CLINICAL DATA:  Postop ileus EXAM: ABDOMEN - 1 VIEW COMPARISON:  CT 11/22/2022 FINDINGS: Mild gaseous distention of the stomach. Multiple gas dilated small bowel loops in the upper abdomen. Moderate gas and fecal material in the nondilated proximal colon and rectum. IMPRESSION: Multiple gas dilated small bowel loops suggesting ileus or partial obstruction. Electronically Signed   By: Lucrezia Europe M.D.   On: 11/25/2022 14:36    Anti-infectives: Anti-infectives (From admission, onward)    Start     Dose/Rate Route Frequency Ordered Stop   11/22/22 1400  ceFAZolin (ANCEF) IVPB 3g/100 mL premix        3 g 200 mL/hr over 30 Minutes Intravenous  Once 11/22/22 1202 11/22/22 1245   11/22/22 1208  ceFAZolin (ANCEF) 3-0.9 GM/100ML-% IVPB       Note to Pharmacy: Lear Ng S: cabinet override      11/22/22 1208 11/22/22 1310   11/22/22 1200  ceFAZolin (ANCEF) IVPB 2g/100 mL premix  Status:  Discontinued        2 g 200 mL/hr over 30 Minutes Intravenous Every 8 hours 11/22/22 1149 11/22/22 1202       Assessment/Plan:  Patient is a 56 year old male who was admitted with an incarcerated umbilical hernia and is status post open umbilical hernia repair with mesh on 12/9.   -I suspect that this patient is suffering from postoperative ileus -NG tube was noted to be in correct position yesterday on repeat KUB, with slight improvement in bowel dilation -NPO -NG to LIS -Mild improvement in patient's leukocytosis, 13.2 from 14.6 -Okay to  continue IV Zosyn at this time -Appreciate cardiology recommendations -I provided an update to the patient's mother.  We discussed that he has a postoperative ileus, which can take several days if not a week to fully resolve.  I ordered a KUB this morning, and it is demonstrating dilated loops of small bowel, mostly unchanged from imaging yesterday.  We will continue with NG tube, and hopefully we will be able to get him extubated in the next day or 2 -Appreciate hospitalist recommendations   LOS: 5 days    [place] 11/27/2022

## 2022-11-27 NOTE — Progress Notes (Signed)
Peripherally Inserted Central Catheter Placement  The IV Nurse has discussed with the patient and/or persons authorized to consent for the patient, the purpose of this procedure and the potential benefits and risks involved with this procedure.  The benefits include less needle sticks, lab draws from the catheter, and the patient may be discharged home with the catheter. Risks include, but not limited to, infection, bleeding, blood clot (thrombus formation), and puncture of an artery; nerve damage and irregular heartbeat and possibility to perform a PICC exchange if needed/ordered by physician.  Alternatives to this procedure were also discussed.  Bard Power PICC patient education guide, fact sheet on infection prevention and patient information card has been provided to patient /or left at bedside.    PICC Placement Documentation  PICC Triple Lumen 11/27/22 Right Basilic 43 cm 1 cm (Active)  Indication for Insertion or Continuance of Line Vasoactive infusions 11/27/22 1855  Exposed Catheter (cm) 0 cm 11/27/22 1855  Site Assessment Clean, Dry, Intact 11/27/22 1855  Lumen #1 Status Flushed;Blood return noted;Saline locked 11/27/22 1855  Lumen #2 Status Flushed;Blood return noted;Saline locked 11/27/22 1855  Lumen #3 Status Flushed;Blood return noted;Saline locked 11/27/22 1855  Dressing Type Transparent 11/27/22 1855  Dressing Status Antimicrobial disc in place 11/27/22 1855  Safety Lock Not Applicable 11/27/22 1855  Line Care Connections checked and tightened 11/27/22 1855  Line Adjustment (NICU/IV Team Only) No 11/27/22 1855  Dressing Intervention New dressing 11/27/22 1855  Dressing Change Due 12/04/22 11/27/22 1855       Kyle Burns 11/27/2022, 6:58 PM

## 2022-11-27 NOTE — Progress Notes (Signed)
PROGRESS NOTE    Kyle Burns  [id number] DOB: 1966/06/04 DOA: 11/22/2022 PCP: Kyle Norlander, DO   Brief Narrative:    [name]  is a 56 y.o. male, with past medical history of hypertension, asthma, GERD, hyperlipidemia, O SA/OHS, BiPAP dependent with oxygen at nighttime. -Patient presents to ED secondary to complaints of abdominal pain, developed yesterday, started having generalized abdominal pain, nausea, after eating [place] food, he denies any vomiting, he does report dry heaving as well, he denies any fever, chills, no dysuria or polyuria, he denies any obstipation or constipation, no diarrhea, no bright red blood per rectum, no chest pain, orts pain has worsened overnight which prompted him to come to ED -Patient underwent open umbilical hernia repair with placement of mesh on 12/9.  He is noted to have worsening abdominal distention suspected to be related to ileus and now has worsening respiratory distress and hypoxemia requiring intubation on 12/13.  He also developed atrial fibrillation with RVR and is on amiodarone infusion.  Assessment & Plan:   Principal Problem:   Incarcerated umbilical hernia Active Problems:   Hyperlipidemia   Obstructive sleep apnea   Gastroesophageal reflux disease   BMI 40.0-44.9, adult (HCC)   Mild intermittent asthma   Nodule of right lung   Acute respiratory failure with hypoxia (HCC)   Atrial fibrillation with rapid ventricular response (HCC)   Small bowel obstruction (HCC)  Assessment and Plan:   Acute hypoxemic respiratory failure secondary to worsening abdominal distention -Underlying OSA/OHS with known left hemidiaphragm paralysis -Intubated 12/13 -Appreciate ongoing PCCM evaluation   Septic shock -Hypotension related to sedation and worsening abdominal distention as well as concern for aspiration pneumonia -Levophed through peripheral line -Start IV Zosyn for aspiration pneumonia coverage -Continue to monitor  a.m. labs as well as lactic acid   Paroxysmal atrial fibrillation/RVR-now in sinus rhythm -Now in sinus rhythm, related to respiratory condition -2D echocardiogram with normal LVEF and grade 1 diastolic dysfunction -Continue IV amiodarone for now -Appreciate cardiology evaluation -No need for anticoagulation at this point   AKI, nonoliguric -Continue aggressive IV fluid appears to be volume down -Monitor urine output -Avoid nephrotoxic agents -A.m. labs  Hypokalemia -Replete and reevaluate in a.m.   Incarcerated umbilical hernia/SBO s/p open umbilical hernia repair with mesh 12/9 -Now with some noted ileus with worsening distention -Continue n.p.o. with NG tube to LIS -Possible aspiration with recurrent vomiting and worsening abdominal distention -May require repeat imaging especially with potential worsening hematoma -Potassium goal of 4.0 and magnesium 2.0   History of hypertension -Currently hypotensive and on pressors, holding home medications   Obstructive sleep apnea/obesity hypoventilation syndrome -Currently intubated   Dyslipidemia -Statin on discharge   GERD -PPI   History of asthma -Appears to be mild and intermittent -Continue as needed bronchodilator management. -Continue the use of Singulair. -No wheezing on exam.   Class III obesity -Body mass index is 39.46 kg/m. -Low-calorie diet, portion control and increase physical activity discussed with patient.   Incidental 4 mm right lung nodule -Outpatient follow-up with pulmonary service recommended.      DVT prophylaxis:Lovenox Code Status: Full Family Communication: None at bedside; Mother called by GS Disposition Plan:  Status is: Inpatient Remains inpatient appropriate because: Need for IV medications   Consultants:  Gen Surg Cardiology Pulm   Procedures:  Open umbilical hernia repair with mesh Intubation 12/13   Antimicrobials:  Anti-infectives (From admission, onward)    Start      Dose/Rate Route Frequency Ordered  Stop   11/22/22 1400  ceFAZolin (ANCEF) IVPB 3g/100 mL premix        3 g 200 mL/hr over 30 Minutes Intravenous  Once 11/22/22 1202 11/22/22 1245   11/22/22 1208  ceFAZolin (ANCEF) 3-0.9 GM/100ML-% IVPB       Note to Pharmacy: Lear Ng S: cabinet override      11/22/22 1208 11/22/22 1310   11/22/22 1200  ceFAZolin (ANCEF) IVPB 2g/100 mL premix  Status:  Discontinued        2 g 200 mL/hr over 30 Minutes Intravenous Every 8 hours 11/22/22 1149 11/22/22 1202       Subjective: Patient seen and evaluated today and is currently sedated on ventilator with no acute overnight events noted.  He continues to have significant abdominal distention with findings of ileus.  Remains on pressors.  Objective: Vitals:   11/27/22 1330 11/27/22 1400 11/27/22 1415 11/27/22 1430  BP: 117/71 108/65 104/66 109/65  Pulse: 72 70 69 70  Resp: (!) 24 (!) 23 (!) 23 (!) 23  Temp:      TempSrc:      SpO2: 96% 96% 97% 96%  Weight:      Height:        Intake/Output Summary (Last 24 hours) at 11/27/2022 1508 Last data filed at 11/27/2022 1500 Gross per 24 hour  Intake 5783.13 ml  Output 2150 ml  Net 3633.13 ml   Filed Weights   11/24/22 1238 11/26/22 0415 11/27/22 0402  Weight: 128.3 kg 125.7 kg 129.7 kg    Examination:  General exam: Appears sedated on ventilator Respiratory system: Clear to auscultation. Respiratory effort normal.  Intubated and sedated with FiO2 60% Cardiovascular system: S1 & S2 heard, RRR.  Gastrointestinal system: Abdomen is distended with hematoma around umbilical incision site Central nervous system: Sedated Extremities: No edema Skin: No significant lesions noted Psychiatry: Cannot be assessed    Data Reviewed: I have personally reviewed following labs and imaging studies  CBC: Recent Labs  Lab 11/22/22 0607 11/22/22 0635 11/23/22 0346 11/24/22 0354 11/25/22 0422 11/26/22 0925 11/27/22 0432  WBC 14.5*  --  14.9* 10.2 14.3*  14.6* 13.2*  NEUTROABS 12.2*  --   --   --   --  10.3*  --   HGB 14.9   < > 14.8 14.6 15.9 14.8 12.8*  HCT 46.0   < > 45.0 44.3 48.6 44.0 38.0*  MCV 88.6  --  89.6 87.9 88.0 86.3 85.4  PLT 323  --  359 322 398 409* 366   < > = values in this interval not displayed.   Basic Metabolic Panel: Recent Labs  Lab 11/22/22 0607 11/22/22 0635 11/23/22 0346 11/25/22 0422 11/26/22 0342 11/27/22 0432  NA 139 141 137 139 137 130*  K 3.7 3.9 3.7 3.5 3.0* 3.1*  CL 105 101 101 100 94* 93*  CO2 28  --  29 26 28 26   GLUCOSE 139* 139* 121* 162* 167* 140*  BUN 21* 21* 17 38* 64* 78*  CREATININE 1.03 1.10 1.06 1.58* 2.66* 2.89*  CALCIUM 9.4  --  8.8* 9.5 8.9 8.1*  MG  --   --   --  2.9* 3.7* 3.8*   GFR: Estimated Creatinine Clearance: 38.3 mL/min (A) (by C-G formula based on SCr of 2.89 mg/dL (H)). Liver Function Tests: Recent Labs  Lab 11/22/22 0607  AST 24  ALT 25  ALKPHOS 89  BILITOT 0.7  PROT 8.0  ALBUMIN 4.5   Recent Labs  Lab 11/22/22  4132  LIPASE 45   No results for input(s): "AMMONIA" in the last 168 hours. Coagulation Profile: No results for input(s): "INR", "PROTIME" in the last 168 hours. Cardiac Enzymes: No results for input(s): "CKTOTAL", "CKMB", "CKMBINDEX", "TROPONINI" in the last 168 hours. BNP (last 3 results) No results for input(s): "PROBNP" in the last 8760 hours. HbA1C: No results for input(s): "HGBA1C" in the last 72 hours. CBG: Recent Labs  Lab 11/26/22 1926 11/27/22 0009 11/27/22 0358 11/27/22 0758 11/27/22 1126  GLUCAP 133* 147* 137* 135* 129*  129*   Lipid Profile: Recent Labs    11/27/22 0432  TRIG 241*   Thyroid Function Tests: No results for input(s): "TSH", "T4TOTAL", "FREET4", "T3FREE", "THYROIDAB" in the last 72 hours. Anemia Panel: No results for input(s): "VITAMINB12", "FOLATE", "FERRITIN", "TIBC", "IRON", "RETICCTPCT" in the last 72 hours. Sepsis Labs: No results for input(s): "PROCALCITON", "LATICACIDVEN" in the last 168  hours.  Recent Results (from the past 240 hour(s))  MRSA Next Gen by PCR, Nasal     Status: None   Collection Time: 11/22/22  5:25 PM   Specimen: Nasal Mucosa; Nasal Swab  Result Value Ref Range Status   MRSA by PCR Next Gen NOT DETECTED NOT DETECTED Final    Comment: (NOTE) The GeneXpert MRSA Assay (FDA approved for NASAL specimens only), is one component of a comprehensive MRSA colonization surveillance program. It is not intended to diagnose MRSA infection nor to guide or monitor treatment for MRSA infections. Test performance is not FDA approved in patients less than 44 years old. Performed at [facility], [address], [place], [place] [address]   Culture, Respiratory w Gram Stain     Status: None (Preliminary result)   Collection Time: 11/26/22  3:58 PM   Specimen: Tracheal Aspirate; Respiratory  Result Value Ref Range Status   Specimen Description   Final    TRACHEAL ASPIRATE Performed at [facility], [address], [place], [place] [address]    Special Requests   Final    NONE Performed at [facility], [address] [place], [place] [address]    Gram Stain   Final    RARE WBC PRESENT, PREDOMINANTLY PMN MODERATE GRAM NEGATIVE RODS Performed at [facility], [address] [place], [place] [address]    Culture [facility]  Final   Report Status PENDING  Incomplete         Radiology Studies: DG Abd 2 Views  Result Date: 11/27/2022 CLINICAL DATA:  269179 Ileus, postoperative (HCC) [id number] EXAM: ABDOMEN - 2 VIEW COMPARISON:  X-ray abdomen 11/26/2022 FINDINGS: Enteric tube with tip overlying the expected region of the gastric lumen. Persistent gaseous dilatation of the large and small bowel. Limited evaluation for free air. Right hemidiaphragm collimated off view on the semi upright view. No radio-opaque calculi or other significant radiographic abnormality is seen. IMPRESSION: 1. Enteric tube in good position. 2. Persistent gaseous  dilatation of the small and large bowel consistent with known ileus. Electronically Signed   By: Tish Frederickson M.D.   On: 11/27/2022 13:38   [place] EKG SITE RITE  Result Date: 11/27/2022 If Site Rite image not attached, placement could not be confirmed due to current cardiac rhythm.  DG Abd Portable 1V  Result Date: 11/26/2022 CLINICAL DATA:  NG tube placement. EXAM: PORTABLE ABDOMEN - 1 VIEW COMPARISON:  11/25/2022 FINDINGS: The NG tube tip is in the body region of the stomach. Improved bowel gas pattern with less distention of the small bowel.  IMPRESSION: NG tube tip is in the body region of the stomach. Electronically Signed   By: Marijo Sanes M.D.   On: 11/26/2022 11:08   DG Chest Portable 1 View  Result Date: 11/26/2022 CLINICAL DATA:  Et tube placement EXAM: PORTABLE CHEST - 1 VIEW COMPARISON:  Earlier film of the same day FINDINGS: Endotracheal tube has been placed, tip 3.1 cm above carina. Nasogastric tube loops in the stomach which is incompletely decompressed. Low lung volumes with linear scarring or atelectasis at the right lung base as before. Elevated left hemidiaphragm as before. Heart size and mediastinal contours are within normal limits. No effusion. Visualized bones unremarkable. IMPRESSION: Endotracheal tube tip 3.1 cm above the carina. Electronically Signed   By: Lucrezia Europe M.D.   On: 11/26/2022 08:23   DG CHEST PORT 1 VIEW  Result Date: 11/26/2022 CLINICAL DATA:  Increasing oxygen demand EXAM: PORTABLE CHEST 1 VIEW COMPARISON:  Yesterday FINDINGS: Enteric tube with tip reaching the stomach. Gas distended stomach also seen on prior. Low volume chest with streaky density at the medial right base. Normal heart size and stable mediastinal contours. No effusion or pneumothorax. IMPRESSION: 1. Continued elevation of the left diaphragm with gas distended stomach. Please correlate for enteric tube function, the catheter reaches the stomach. 2. Low volume chest with mild atelectasis.  Electronically Signed   By: Jorje Guild M.D.   On: 11/26/2022 05:05   DG Chest Port 1 View  Result Date: 11/25/2022 CLINICAL DATA:  Enteric catheter placement EXAM: PORTABLE CHEST 1 VIEW COMPARISON:  09/15/2022, 11/25/2022 FINDINGS: Frontal view of the chest demonstrates enteric catheter passing below diaphragm, tip excluded by collimation. Cardiac silhouette is unremarkable. Low lung volumes, with chronic elevation of the left hemidiaphragm. No airspace disease, effusion, or pneumothorax. IMPRESSION: 1. Terri catheter passes below diaphragm, tip excluded by collimation. 2. Low lung volumes. Electronically Signed   By: Randa Ngo M.D.   On: 11/25/2022 22:11        Scheduled Meds:  bisacodyl  10 mg Rectal Daily   Chlorhexidine Gluconate Cloth  6 each Topical Daily   docusate  100 mg Per Tube BID   [START ON 11/28/2022] enoxaparin (LOVENOX) injection  60 mg Subcutaneous Q24H   fluticasone  1 spray Each Nare Daily   montelukast  10 mg Per Tube QHS    morphine injection  2 mg Intravenous Once   mouth rinse  15 mL Mouth Rinse Q2H   pantoprazole (PROTONIX) IV  40 mg Intravenous Q12H   pneumococcal 23 valent vaccine  0.5 mL Intramuscular Tomorrow-1000   Continuous Infusions:  sodium chloride     sodium chloride 100 mL/hr at 11/27/22 1322   amiodarone 30 mg/hr (11/27/22 1322)   fentaNYL infusion INTRAVENOUS 50 mcg/hr (11/27/22 1341)   norepinephrine (LEVOPHED) Adult infusion 8 mcg/min (11/27/22 1344)   propofol (DIPRIVAN) infusion 40 mcg/kg/min (11/27/22 1342)   sodium chloride       LOS: 5 days    Time spent: 35 minutes    Donivan Thammavong Darleen Crocker, DO [facility]  If 7PM-7AM, please contact night-coverage [email or web address] 11/27/2022, 3:08 PM

## 2022-11-28 ENCOUNTER — Encounter (HOSPITAL_COMMUNITY): Payer: Self-pay | Admitting: Surgery

## 2022-11-28 ENCOUNTER — Inpatient Hospital Stay (HOSPITAL_COMMUNITY): Payer: No Typology Code available for payment source

## 2022-11-28 DIAGNOSIS — K91870 Postprocedural hematoma of a digestive system organ or structure following a digestive system procedure: Secondary | ICD-10-CM | POA: Diagnosis not present

## 2022-11-28 DIAGNOSIS — K420 Umbilical hernia with obstruction, without gangrene: Secondary | ICD-10-CM | POA: Diagnosis not present

## 2022-11-28 DIAGNOSIS — N179 Acute kidney failure, unspecified: Secondary | ICD-10-CM

## 2022-11-28 DIAGNOSIS — I9789 Other postprocedural complications and disorders of the circulatory system, not elsewhere classified: Secondary | ICD-10-CM | POA: Diagnosis not present

## 2022-11-28 DIAGNOSIS — A419 Sepsis, unspecified organism: Secondary | ICD-10-CM

## 2022-11-28 DIAGNOSIS — K9189 Other postprocedural complications and disorders of digestive system: Secondary | ICD-10-CM | POA: Diagnosis not present

## 2022-11-28 DIAGNOSIS — I4891 Unspecified atrial fibrillation: Secondary | ICD-10-CM | POA: Diagnosis not present

## 2022-11-28 LAB — BASIC METABOLIC PANEL
Anion gap: 8 (ref 5–15)
BUN: 57 mg/dL — ABNORMAL HIGH (ref 6–20)
CO2: 27 mmol/L (ref 22–32)
Calcium: 7.5 mg/dL — ABNORMAL LOW (ref 8.9–10.3)
Chloride: 100 mmol/L (ref 98–111)
Creatinine, Ser: 1.73 mg/dL — ABNORMAL HIGH (ref 0.61–1.24)
GFR, Estimated: 45 mL/min — ABNORMAL LOW (ref >60)
Glucose, Bld: 125 mg/dL — ABNORMAL HIGH (ref 70–99)
Potassium: 3.2 mmol/L — ABNORMAL LOW (ref 3.5–5.1)
Sodium: 135 mmol/L (ref 135–145)

## 2022-11-28 LAB — CBC
HCT: 32.5 % — ABNORMAL LOW (ref 39.0–52.0)
Hemoglobin: 10.7 g/dL — ABNORMAL LOW (ref 13.0–17.0)
MCH: 28.7 pg (ref 26.0–34.0)
MCHC: 32.9 g/dL (ref 30.0–36.0)
MCV: 87.1 fL (ref 80.0–100.0)
Platelets: 326 10*3/uL (ref 150–400)
RBC: 3.73 MIL/uL — ABNORMAL LOW (ref 4.22–5.81)
RDW: 13.2 % (ref 11.5–15.5)
WBC: 12.5 10*3/uL — ABNORMAL HIGH (ref 4.0–10.5)
nRBC: 0 % (ref 0.0–0.2)

## 2022-11-28 LAB — GLUCOSE, CAPILLARY
Glucose-Capillary: 114 mg/dL — ABNORMAL HIGH (ref 70–99)
Glucose-Capillary: 116 mg/dL — ABNORMAL HIGH (ref 70–99)
Glucose-Capillary: 122 mg/dL — ABNORMAL HIGH (ref 70–99)
Glucose-Capillary: 123 mg/dL — ABNORMAL HIGH (ref 70–99)
Glucose-Capillary: 136 mg/dL — ABNORMAL HIGH (ref 70–99)

## 2022-11-28 LAB — MAGNESIUM: Magnesium: 3.6 mg/dL — ABNORMAL HIGH (ref 1.7–2.4)

## 2022-11-28 LAB — PHOSPHORUS: Phosphorus: 2.8 mg/dL (ref 2.5–4.6)

## 2022-11-28 MED ORDER — TRAVASOL 10 % IV SOLN
INTRAVENOUS | Status: AC
  Filled 2022-11-28: qty 720

## 2022-11-28 MED ORDER — POTASSIUM CHLORIDE 10 MEQ/100ML IV SOLN
10.0000 meq | INTRAVENOUS | Status: AC
  Administered 2022-11-28 (×3): 10 meq via INTRAVENOUS
  Filled 2022-11-28 (×3): qty 100

## 2022-11-28 MED ORDER — SODIUM CHLORIDE 0.9 % IV SOLN: INTRAVENOUS | Status: AC

## 2022-11-28 MED ORDER — INSULIN ASPART 100 UNIT/ML IJ SOLN
0.0000 [IU] | Freq: Four times a day (QID) | INTRAMUSCULAR | Status: DC
  Administered 2022-11-29 – 2022-12-03 (×13): 3 [IU] via SUBCUTANEOUS
  Administered 2022-12-04: 4 [IU] via SUBCUTANEOUS
  Administered 2022-12-04 (×2): 3 [IU] via SUBCUTANEOUS

## 2022-11-28 MED ORDER — POTASSIUM CHLORIDE 10 MEQ/100ML IV SOLN: 10.0000 meq | INTRAVENOUS | Status: DC

## 2022-11-28 NOTE — Progress Notes (Signed)
[facility] Progress Note  6 Days Post-Op  Subjective: Patient seen and examined.  He is intubated and sedated in the ICU.  He had 2 large bowel movements overnight.  His NG tube has had 1L of gastric appearing output in the last 24 hours.   Objective: Vital signs in last 24 hours: Temp:  [98.6 F (37 C)-100.1 F (37.8 C)] 98.7 F (37.1 C) (12/15 1100) Pulse Rate:  [63-82] 63 (12/15 1100) Resp:  [18-28] 20 (12/15 1100) BP: (79-127)/(50-80) 79/52 (12/15 1100) SpO2:  [94 %-99 %] 96 % (12/15 1100) FiO2 (%):  [40 %-50 %] 40 % (12/15 0755) Weight:  [127.3 kg] 127.3 kg (12/15 0530) Last BM Date : 11/27/22  Intake/Output from previous day: 12/14 0701 - 12/15 0700 In: 6107.2 [I.V.:3858.1; IV Piggyback:2249.1] Out: 3475 [Urine:2425; Emesis/NG output:1050] Intake/Output this shift: Total I/O In: 708.4 [I.V.:398.1; IV Piggyback:310.3] Out: 225 [Urine:225]  General appearance: Intubated and sedated in ICU Nose: NG tube in place with gastric contents  GI: Abdomen soft, minimal distention, non tender to palpation; no rigidity, or guarding; skin overlying incision site tense with overlying ecchymosis- likely hematoma; incision with skin glue in place  Lab Results:  Recent Labs    11/27/22 0432 11/28/22 0432  WBC 13.2* 12.5*  HGB 12.8* 10.7*  HCT 38.0* 32.5*  PLT 366 326   BMET Recent Labs    11/27/22 0432 11/28/22 0432  NA 130* 135  K 3.1* 3.2*  CL 93* 100  CO2 26 27  GLUCOSE 140* 125*  BUN 78* 57*  CREATININE 2.89* 1.73*  CALCIUM 8.1* 7.5*   PT/INR No results for input(s): "LABPROT", "INR" in the last 72 hours.  Studies/Results: DG Chest Port 1 View  Result Date: 11/28/2022 CLINICAL DATA:  5626 with ventilator dependent respiratory failure and ARF. EXAM: PORTABLE CHEST 1 VIEW COMPARISON:  Portable chest 11/26/2022 FINDINGS: 5:26 a.m. ETT tip is 3 cm from the carina. NGT passes into the stomach but neither the tip or side hole are filmed. Mild  cardiomegaly. There is increased central vascular prominence but no overt edema. There is increased small to moderate left pleural effusion and overlying consolidation or atelectasis in the left lower lobe. The remaining hypoexpanded lungs are generally clear. There is stable mediastinal widening. New right PICC in place terminates at about the superior cavoatrial junction. There are overlying monitor wires. IMPRESSION: 1. Increased small to moderate left pleural effusion and overlying consolidation or atelectasis in the left lower lobe. 2. Increased central vascular prominence but no overt edema. 3. Stable mediastinal widening. 4. Support apparatus as above. Electronically Signed   By: Almira Bar M.D.   On: 11/28/2022 06:26   DG Abd 2 Views  Result Date: 11/27/2022 CLINICAL DATA:  [id number] Ileus, postoperative (HCC) [id number] EXAM: ABDOMEN - 2 VIEW COMPARISON:  X-ray abdomen 11/26/2022 FINDINGS: Enteric tube with tip overlying the expected region of the gastric lumen. Persistent gaseous dilatation of the large and small bowel. Limited evaluation for free air. Right hemidiaphragm collimated off view on the semi upright view. No radio-opaque calculi or other significant radiographic abnormality is seen. IMPRESSION: 1. Enteric tube in good position. 2. Persistent gaseous dilatation of the small and large bowel consistent with known ileus. Electronically Signed   By: Tish Frederickson M.D.   On: 11/27/2022 13:38   [place] EKG SITE RITE  Result Date: 11/27/2022 If Site Rite image not attached, placement could not be confirmed due to current cardiac rhythm.   Anti-infectives: Anti-infectives (From admission, onward)  Start     Dose/Rate Route Frequency Ordered Stop   11/27/22 1600  piperacillin-tazobactam (ZOSYN) IVPB 3.375 g        3.375 g 12.5 mL/hr over 240 Minutes Intravenous Every 8 hours 11/27/22 1525     11/22/22 1400  ceFAZolin (ANCEF) IVPB 3g/100 mL premix        3 g 200 mL/hr over 30 Minutes  Intravenous  Once 11/22/22 1202 11/22/22 1245   11/22/22 1208  ceFAZolin (ANCEF) 3-0.9 GM/100ML-% IVPB       Note to Pharmacy: Lear Ng S: cabinet override      11/22/22 1208 11/22/22 1310   11/22/22 1200  ceFAZolin (ANCEF) IVPB 2g/100 mL premix  Status:  Discontinued        2 g 200 mL/hr over 30 Minutes Intravenous Every 8 hours 11/22/22 1149 11/22/22 1202       Assessment/Plan:  Patient is a 56 year old male who was admitted with an incarcerated umbilical hernia and is status post open umbilical hernia repair with mesh on 12/9.   -Patient with return of bowel function overnight -Given his slow progression with worsening ileus immediately postoperatively, I would recommend keeping NG tube in until patient successfully extubated -NPO -NG to LIS -TPN ordered -Continue Dulcolax suppositories -Leukocytosis improving, 12.5 from 13.2 -Antibiotics per primary team -Appreciate cardiology recommendations -I provided an update to the patient's mother and niece.  Will hold off on further imaging of the abdomen at this time given his return of bowel function and softening abdomen.  I explained that he has a hematoma at his incision site, which we will continue to monitor.  I also updated on the plan to keep NG tube in place until after extubation and plan to initiate TPN -Appreciate hospitalist recommendations -Dr. Constance Haw will be seeing the patient over the weekend   LOS: 6 days    [place] 11/28/2022

## 2022-11-28 NOTE — Progress Notes (Signed)
Nutrition Follow up   INTERVENTION:  TPN per pharmacy   MVI and minerals     NUTRITION DIAGNOSIS:   Inadequate oral intake related to inability to eat as evidenced by NPO status.  GOAL:  Provide needs based on ASPEN/SCCM guidelines  MONITOR:  Vent status, Labs, I & O's, Weight trends  REASON FOR ASSESSMENT: New TNA     ASSESSMENT: Patient is a 56 yo male who presents with hx of HTN, asthma, back pain, GERD. Presents with abdominal pain.   12/9- umbilical hernia repair with mesh placement.   Patient intubated 2/13 on ventilator support. Requiring pressor support.  MAP-77   Per surgery hopeful to extubate in 1-2 days. Patient eating well prior to surgery per his mother who is bedside. Small bowel obstruction. NGT with 2 liters OP past 24 hr per MD. Talked with nursing.   12/15 Patient remains intubated but respiratory is working with patient for possible extubation. Reported- 2 large stools per MD note. NGT with 1 liters output x 24 hr. Patient is starting TPN today.  Nutrition needs re-assessed. Medications reviewed.  MV: 11.8 L/min Temp (24hrs), Avg:99.3 F (37.4 C), Min:98.6 F (37 C), Max:100.1 F (37.8 C)  Propofol: 26.4 ml/hr (697 kcal)   Intake/Output Summary (Last 24 hours) at 11/28/2022 1206 Last data filed at 11/28/2022 1144 Gross per 24 hour  Intake 4484.61 ml  Output 3300 ml  Net 1184.61 ml    Since admission: +3.252 Liters  CBG (last 3)  Recent Labs    11/28/22 0512 11/28/22 0707 11/28/22 1106  GLUCAP 123* 116* 136*        Latest Ref Rng & Units 11/28/2022    4:32 AM 11/27/2022    4:32 AM 11/26/2022    3:42 AM  BMP  Glucose 70 - 99 mg/dL 264  158  309   BUN 6 - 20 mg/dL 57  78  64   Creatinine 0.61 - 1.24 mg/dL 4.07  6.80  8.81   Sodium 135 - 145 mmol/L 135  130  137   Potassium 3.5 - 5.1 mmol/L 3.2  3.1  3.0   Chloride 98 - 111 mmol/L 100  93  94   CO2 22 - 32 mmol/L 27  26  28    Calcium 8.9 - 10.3 mg/dL 7.5  8.1  8.9    Diet Order:    Diet Order             Diet NPO time specified  Diet effective now                   EDUCATION NEEDS:  Not appropriate for education at this time  Skin:  Skin Assessment: Skin Integrity Issues: Skin Integrity Issues:: Incisions Incisions: closed -abdomen  Last BM:  12/14 small type 7- distended abdomen (suspected ileus)  Height:   Ht Readings from Last 1 Encounters:  11/27/22 5\' 11"  (1.803 m)    Weight:   Wt Readings from Last 1 Encounters:  11/28/22 127.3 kg    Ideal Body Weight:   78 kg  BMI:  Body mass index is 39.14 kg/m.  Estimated Nutritional Needs:   Kcal:  2300-2500  Protein:  150-160 gr   Fluid:  >2 liters daily  MS,RD,CSG,LDN Contact: 11/30/22

## 2022-11-28 NOTE — Progress Notes (Signed)
PROGRESS NOTE    Kyle SCHNEEKLOTH  [id number] DOB: [birth year] DOA: 11/22/2022 PCP: Janora Norlander, DO   Brief Narrative:    Kyle Burns  is a 56 y.o. male, with past medical history of hypertension, asthma, GERD, hyperlipidemia, O SA/OHS, BiPAP dependent with oxygen at nighttime. -Patient presents to ED secondary to complaints of abdominal pain, developed yesterday, started having generalized abdominal pain, nausea, after eating [place] food, he denies any vomiting, he does report dry heaving as well, he denies any fever, chills, no dysuria or polyuria, he denies any obstipation or constipation, no diarrhea, no bright red blood per rectum, no chest pain, orts pain has worsened overnight which prompted him to come to ED -Patient underwent open umbilical hernia repair with placement of mesh on 12/9.  He is noted to have worsening abdominal distention suspected to be related to ileus and now has worsening respiratory distress and hypoxemia requiring intubation on 12/13.  He also developed atrial fibrillation with RVR and has remained in sinus rhythm and therefore amiodarone infusion has been discontinued.  Ileus appears to be slowly improving with increase in bowel movements and less NG tube output noted.  TPN initiated 12/15.  Assessment & Plan:   Principal Problem:   Incarcerated umbilical hernia Active Problems:   Hyperlipidemia   Obstructive sleep apnea   Gastroesophageal reflux disease   BMI 40.0-44.9, adult (HCC)   Mild intermittent asthma   Nodule of right lung   Acute respiratory failure with hypoxia (HCC)   Atrial fibrillation with rapid ventricular response (HCC)   Small bowel obstruction (HCC)   Septic shock (HCC)   AKI (acute kidney injury) ([place])  Assessment and Plan:   Acute hypoxemic respiratory failure secondary to worsening abdominal distention -Underlying OSA/OHS with known left hemidiaphragm paralysis -Intubated 12/13 -Appreciate ongoing PCCM evaluation,  holding off on extubation until bowel distention improves   Septic shock -Hypotension related to sedation and worsening abdominal distention as well as concern for aspiration pneumonia -Levophed through peripheral line, continue to wean to off -Start IV Zosyn for aspiration pneumonia coverage, noted to have Serratia and therefore can likely switch to Rocephin by tomorrow -Continue to monitor a.m. labs as well as lactic acid   Paroxysmal atrial fibrillation/RVR-now in sinus rhythm -Now in sinus rhythm, related to respiratory condition -2D echocardiogram with normal LVEF and grade 1 diastolic dysfunction -Amiodarone discontinued -Appreciate cardiology evaluation -No need for anticoagulation at this point   AKI, nonoliguric, improving -Continue aggressive IV fluid appears to be volume down -Monitor urine output -Avoid nephrotoxic agents -A.m. labs   Hypokalemia -Replete and reevaluate in a.m.   Incarcerated umbilical hernia/SBO s/p open umbilical hernia repair with mesh 12/9 now with ileus -Appears to slowly be improving -Continue n.p.o. with NG tube to LIS -Possible aspiration with recurrent vomiting and worsening abdominal distention -May require repeat imaging especially with potential worsening hematoma -Potassium goal of 4.0 and magnesium 2.0 -TPN to be initiated 12/15 per general surgery recommendations   History of hypertension -Currently hypotensive and on pressors, holding home medications   Obstructive sleep apnea/obesity hypoventilation syndrome -Currently intubated -Plan to extubate to BiPAP   Dyslipidemia -Statin on discharge   GERD -PPI   History of asthma -Appears to be mild and intermittent -Continue as needed bronchodilator management. -Continue the use of Singulair. -No wheezing on exam.   Class III obesity -Body mass index is 39.46 kg/m. -Low-calorie diet, portion control and increase physical activity discussed with patient.   Incidental 4 mm  right  lung nodule -Outpatient follow-up with pulmonary service recommended.      DVT prophylaxis:Lovenox Code Status: Full Family Communication: None at bedside; Mother called by GS Disposition Plan:  Status is: Inpatient Remains inpatient appropriate because: Need for IV medications   Consultants:  Gen Surg Cardiology Pulm   Procedures:  Open umbilical hernia repair with mesh Intubation 12/13   Antimicrobials:  Anti-infectives (From admission, onward)    Start     Dose/Rate Route Frequency Ordered Stop   11/27/22 1600  piperacillin-tazobactam (ZOSYN) IVPB 3.375 g        3.375 g 12.5 mL/hr over 240 Minutes Intravenous Every 8 hours 11/27/22 1525     11/22/22 1400  ceFAZolin (ANCEF) IVPB 3g/100 mL premix        3 g 200 mL/hr over 30 Minutes Intravenous  Once 11/22/22 1202 11/22/22 1245   11/22/22 1208  ceFAZolin (ANCEF) 3-0.9 GM/100ML-% IVPB       Note to Pharmacy: Elvina Sidle S: cabinet override      11/22/22 1208 11/22/22 1310   11/22/22 1200  ceFAZolin (ANCEF) IVPB 2g/100 mL premix  Status:  Discontinued        2 g 200 mL/hr over 30 Minutes Intravenous Every 8 hours 11/22/22 1149 11/22/22 1202      Subjective: Patient seen and evaluated today with improvements in ileus noted with less abdominal distention as well as less NG tube output and bowel movements noted overnight.  Norepinephrine has been getting weaned and blood pressure slowly improving as well as kidney function.  Starting TPN for nutritional support.  Objective: Vitals:   11/28/22 1215 11/28/22 1230 11/28/22 1250 11/28/22 1300  BP: (!) 107/56 91/70 105/60 (!) 101/59  Pulse: 74 76 77 75  Resp: (!) 36 (!) 30 (!) 24 (!) 22  Temp:      TempSrc:      SpO2: 97% 96% 97% 95%  Weight:      Height:        Intake/Output Summary (Last 24 hours) at 11/28/2022 1340 Last data filed at 11/28/2022 1236 Gross per 24 hour  Intake 3759.83 ml  Output 3300 ml  Net 459.83 ml   Filed Weights   11/27/22 0402  11/28/22 0530 11/28/22 1114  Weight: 129.7 kg 127.3 kg 127.3 kg    Examination:  General exam: Appears calm and comfortable, obese Respiratory system: Clear to auscultation. Respiratory effort normal.  Intubated on mechanical ventilation Cardiovascular system: S1 & S2 heard, RRR.  Gastrointestinal system: Abdomen is soft, distended with hematoma around surgical site C/D/I Central nervous system: Sedated Extremities: No edema Skin: No significant lesions noted Psychiatry: Cannot be assessed    Data Reviewed: I have personally reviewed following labs and imaging studies  CBC: Recent Labs  Lab 11/22/22 0607 11/22/22 0635 11/24/22 0354 11/25/22 0422 11/26/22 0925 11/27/22 0432 11/28/22 0432  WBC 14.5*   < > 10.2 14.3* 14.6* 13.2* 12.5*  NEUTROABS 12.2*  --   --   --  10.3*  --   --   HGB 14.9   < > 14.6 15.9 14.8 12.8* 10.7*  HCT 46.0   < > 44.3 48.6 44.0 38.0* 32.5*  MCV 88.6   < > 87.9 88.0 86.3 85.4 87.1  PLT 323   < > 322 398 409* 366 326   < > = values in this interval not displayed.   Basic Metabolic Panel: Recent Labs  Lab 11/23/22 0346 11/25/22 0422 11/26/22 0342 11/27/22 0432 11/28/22 0432  NA 137 139 137  130* 135  K 3.7 3.5 3.0* 3.1* 3.2*  CL 101 100 94* 93* 100  CO2 29 26 28 26 27   GLUCOSE 121* 162* 167* 140* 125*  BUN 17 38* 64* 78* 57*  CREATININE 1.06 1.58* 2.66* 2.89* 1.73*  CALCIUM 8.8* 9.5 8.9 8.1* 7.5*  MG  --  2.9* 3.7* 3.8* 3.6*  PHOS  --   --   --   --  2.8   GFR: Estimated Creatinine Clearance: 63.3 mL/min (A) (by C-G formula based on SCr of 1.73 mg/dL (H)). Liver Function Tests: Recent Labs  Lab 11/22/22 0607  AST 24  ALT 25  ALKPHOS 89  BILITOT 0.7  PROT 8.0  ALBUMIN 4.5   Recent Labs  Lab 11/22/22 0607  LIPASE 45   No results for input(s): "AMMONIA" in the last 168 hours. Coagulation Profile: No results for input(s): "INR", "PROTIME" in the last 168 hours. Cardiac Enzymes: No results for input(s): "CKTOTAL", "CKMB",  "CKMBINDEX", "TROPONINI" in the last 168 hours. BNP (last 3 results) No results for input(s): "PROBNP" in the last 8760 hours. HbA1C: No results for input(s): "HGBA1C" in the last 72 hours. CBG: Recent Labs  Lab 11/27/22 1958 11/28/22 0032 11/28/22 0512 11/28/22 0707 11/28/22 1106  GLUCAP 127* 122* 123* 116* 136*   Lipid Profile: Recent Labs    11/27/22 0432  TRIG 241*   Thyroid Function Tests: No results for input(s): "TSH", "T4TOTAL", "FREET4", "T3FREE", "THYROIDAB" in the last 72 hours. Anemia Panel: No results for input(s): "VITAMINB12", "FOLATE", "FERRITIN", "TIBC", "IRON", "RETICCTPCT" in the last 72 hours. Sepsis Labs: No results for input(s): "PROCALCITON", "LATICACIDVEN" in the last 168 hours.  Recent Results (from the past 240 hour(s))  MRSA Next Gen by PCR, Nasal     Status: None   Collection Time: 11/22/22  5:25 PM   Specimen: Nasal Mucosa; Nasal Swab  Result Value Ref Range Status   MRSA by PCR Next Gen NOT DETECTED NOT DETECTED Final    Comment: (NOTE) The GeneXpert MRSA Assay (FDA approved for NASAL specimens only), is one component of a comprehensive MRSA colonization surveillance program. It is not intended to diagnose MRSA infection nor to guide or monitor treatment for MRSA infections. Test performance is not FDA approved in patients less than 5 years old. Performed at [facility], [address] [place], [place] [address]   Culture, Respiratory w Gram Stain     Status: None (Preliminary result)   Collection Time: 11/26/22  3:58 PM   Specimen: Tracheal Aspirate; Respiratory  Result Value Ref Range Status   Specimen Description   Final    TRACHEAL ASPIRATE Performed at [facility], [address], [place], [place] [address]    Special Requests   Final    NONE Performed at [facility], [address] [place], [place] [address]    Gram Stain   Final    RARE WBC PRESENT, PREDOMINANTLY PMN MODERATE GRAM NEGATIVE RODS    Culture   Final     ABUNDANT SERRATIA MARCESCENS FEW GRAM NEGATIVE RODS CULTURE REINCUBATED FOR BETTER GROWTH Performed at [facility], [place] [address], [place], [place] [address]    Report Status PENDING  Incomplete   Organism ID, Bacteria SERRATIA MARCESCENS  Final      Susceptibility   Serratia marcescens - MIC*    CEFAZOLIN >=64 RESISTANT Resistant     CEFEPIME <=0.12 SENSITIVE Sensitive     CEFTAZIDIME <=1 SENSITIVE Sensitive     CEFTRIAXONE <=0.25 SENSITIVE Sensitive  CIPROFLOXACIN <=0.25 SENSITIVE Sensitive     GENTAMICIN <=1 SENSITIVE Sensitive     TRIMETH/SULFA <=20 SENSITIVE Sensitive     * ABUNDANT SERRATIA MARCESCENS         Radiology Studies: DG Chest Port 1 View  Result Date: 11/28/2022 CLINICAL DATA:  [id number] with ventilator dependent respiratory failure and ARF. EXAM: PORTABLE CHEST 1 VIEW COMPARISON:  Portable chest 11/26/2022 FINDINGS: 5:26 a.m. ETT tip is 3 cm from the carina. NGT passes into the stomach but neither the tip or side hole are filmed. Mild cardiomegaly. There is increased central vascular prominence but no overt edema. There is increased small to moderate left pleural effusion and overlying consolidation or atelectasis in the left lower lobe. The remaining hypoexpanded lungs are generally clear. There is stable mediastinal widening. New right PICC in place terminates at about the superior cavoatrial junction. There are overlying monitor wires. IMPRESSION: 1. Increased small to moderate left pleural effusion and overlying consolidation or atelectasis in the left lower lobe. 2. Increased central vascular prominence but no overt edema. 3. Stable mediastinal widening. 4. Support apparatus as above. Electronically Signed   By: Telford Nab M.D.   On: 11/28/2022 06:26   DG Abd 2 Views  Result Date: 11/27/2022 CLINICAL DATA:  [id number] Ileus, postoperative ([place]) [id number] EXAM: ABDOMEN - 2 VIEW COMPARISON:  X-ray abdomen 11/26/2022 FINDINGS: Enteric tube with tip overlying the  expected region of the gastric lumen. Persistent gaseous dilatation of the large and small bowel. Limited evaluation for free air. Right hemidiaphragm collimated off view on the semi upright view. No radio-opaque calculi or other significant radiographic abnormality is seen. IMPRESSION: 1. Enteric tube in good position. 2. Persistent gaseous dilatation of the small and large bowel consistent with known ileus. Electronically Signed   By: Iven Finn M.D.   On: 11/27/2022 13:38   [place] EKG SITE RITE  Result Date: 11/27/2022 If Site Rite image not attached, placement could not be confirmed due to current cardiac rhythm.       Scheduled Meds:  bisacodyl  10 mg Rectal Daily   Chlorhexidine Gluconate Cloth  6 each Topical Daily   docusate  100 mg Per Tube BID   enoxaparin (LOVENOX) injection  60 mg Subcutaneous Q24H   fluticasone  1 spray Each Nare Daily   [START ON 11/29/2022] insulin aspart  0-20 Units Subcutaneous Q6H   montelukast  10 mg Per Tube QHS    morphine injection  2 mg Intravenous Once   mouth rinse  15 mL Mouth Rinse Q2H   pantoprazole (PROTONIX) IV  40 mg Intravenous Q12H   pneumococcal 23 valent vaccine  0.5 mL Intramuscular Tomorrow-1000   sodium chloride flush  10-40 mL Intracatheter Q12H   Continuous Infusions:  sodium chloride     sodium chloride Stopped (11/28/22 1235)   sodium chloride     fentaNYL infusion INTRAVENOUS Stopped (11/28/22 1058)   norepinephrine (LEVOPHED) Adult infusion Stopped (11/28/22 0847)   piperacillin-tazobactam (ZOSYN)  IV 12.5 mL/hr at 11/28/22 1236   propofol (DIPRIVAN) infusion 40 mcg/kg/min (11/28/22 1236)   TPN ADULT (ION)       LOS: 6 days    Time spent: 35 minutes    Anees Vanecek Darleen Crocker, DO [facility]  If 7PM-7AM, please contact night-coverage [email or web address] 11/28/2022, 1:40 PM

## 2022-11-28 NOTE — Progress Notes (Addendum)
PHARMACY - TOTAL PARENTERAL NUTRITION CONSULT NOTE   Indication: Prolonged ileus  Patient Measurements: Height: 5\' 11"  (180.3 cm) Weight: 127.3 kg (280 lb 10.3 oz) IBW/kg (Calculated) : 75.3 TPN AdjBW (KG): 88.9 Body mass index is 39.14 kg/m. Usual Weight:   Assessment:   Glucose / Insulin: 116 - 136 Electrolytes: K 3.2   Mag 3.6 Renal: WNL Hepatic: TG 241 Intake / Output; MIVF: NS @ 100 mL/hr GI Imaging: GI Surgeries / Procedures:   Central access: PICC TPN start date: 12/15  Nutritional Goals: Goal TPN rate is 100 mL/hr (provides 156 g of protein and 2300 kcals per day)  Patient currently on propofol @ 34 mL/hr ==> ~897 kcal/day   RD Assessment: Estimated Needs Total Energy Estimated Needs: 2400 Total Protein Estimated Needs: 150-160 gr Total Fluid Estimated Needs: >2 liters daily  Current Nutrition:  NPO  Plan:  Start TPN at 50 mL/hr at 1800- no lipids in TPN since patient is receiving ~897 kcal/day with propofol Electrolytes in TPN: Na 45mEq/L, K 64mEq/L, Ca 40mEq/L, Mg 44mEq/L, and Phos 90mmol/L. Cl:Ac 1:1 Add standard MVI and trace elements to TPN Initiate Resistant q6h SSI and adjust as needed  Reduce MIVF to 50 mL/hr at 1800 Monitor TPN labs on Mon/Thurs,   12m, PharmD Clinical Pharmacist 11/28/2022 12:19 PM

## 2022-11-28 NOTE — Progress Notes (Signed)
Placed back on Full Support due to increased RR and agitation.

## 2022-11-28 NOTE — Progress Notes (Signed)
Rounding Note    Patient Name: Kyle Burns Date of Encounter: 11/28/2022  [facility] Cardiologist: Leona Singleton  Subjective   No events overnight  Inpatient Medications    Scheduled Meds:  bisacodyl  10 mg Rectal Daily   Chlorhexidine Gluconate Cloth  6 each Topical Daily   docusate  100 mg Per Tube BID   enoxaparin (LOVENOX) injection  60 mg Subcutaneous Q24H   fluticasone  1 spray Each Nare Daily   montelukast  10 mg Per Tube QHS    morphine injection  2 mg Intravenous Once   mouth rinse  15 mL Mouth Rinse Q2H   pantoprazole (PROTONIX) IV  40 mg Intravenous Q12H   pneumococcal 23 valent vaccine  0.5 mL Intramuscular Tomorrow-1000   sodium chloride flush  10-40 mL Intracatheter Q12H   Continuous Infusions:  sodium chloride     sodium chloride Stopped (11/28/22 0541)   amiodarone 30 mg/hr (11/28/22 0839)   fentaNYL infusion INTRAVENOUS 50 mcg/hr (11/28/22 0839)   norepinephrine (LEVOPHED) Adult infusion 2 mcg/min (11/28/22 0839)   piperacillin-tazobactam (ZOSYN)  IV 12.5 mL/hr at 11/28/22 0839   potassium chloride 100 mL/hr at 11/28/22 0839   propofol (DIPRIVAN) infusion 50 mcg/kg/min (11/28/22 0839)   PRN Meds: alum & mag hydroxide-simeth, fentaNYL, hydrALAZINE, levalbuterol, midazolam, morphine injection, ondansetron (ZOFRAN) IV, mouth rinse, oxyCODONE, phenol, prochlorperazine, sodium chloride flush   Vital Signs    Vitals:   11/28/22 0730 11/28/22 0759 11/28/22 0800 11/28/22 0815  BP: 115/78 113/76 113/76 118/80  Pulse: 68 66 68 65  Resp: (!) 21 (!) 22 20 (!) 21  Temp:      TempSrc:      SpO2: 97% 96% 99% 98%  Weight:      Height:        Intake/Output Summary (Last 24 hours) at 11/28/2022 0851 Last data filed at 11/28/2022 0839 Gross per 24 hour  Intake 5194.71 ml  Output 3700 ml  Net 1494.71 ml      11/28/2022    5:30 AM 11/27/2022    4:02 AM 11/26/2022    4:15 AM  Last 3 Weights  Weight (lbs) 280 lb 10.3 oz 285 lb 15 oz  277 lb 1.9 oz  Weight (kg) 127.3 kg 129.7 kg 125.7 kg      Telemetry    SR - Personally Reviewed  ECG    N/a - Personally Reviewed  Physical Exam   GEN: No acute distress.   Neck: No JVD Cardiac: RRR, no murmurs, rubs, or gallops.  Respiratory: Clear to auscultation bilaterally. GI: Soft, nontender, non-distended  MS: No edema; No deformity. Neuro:  Nonfocal  Psych: not able to assess, intubated.   Labs    High Sensitivity Troponin:   Recent Labs  Lab 11/24/22 1948 11/24/22 2141  TROPONINIHS 10 9     Chemistry Recent Labs  Lab 11/22/22 0607 11/22/22 0635 11/26/22 0342 11/27/22 0432 11/28/22 0432  NA 139   < > 137 130* 135  K 3.7   < > 3.0* 3.1* 3.2*  CL 105   < > 94* 93* 100  CO2 28   < > 28 26 27   GLUCOSE 139*   < > 167* 140* 125*  BUN 21*   < > 64* 78* 57*  CREATININE 1.03   < > 2.66* 2.89* 1.73*  CALCIUM 9.4   < > 8.9 8.1* 7.5*  MG  --    < > 3.7* 3.8* 3.6*  PROT 8.0  --   --   --   --  ALBUMIN 4.5  --   --   --   --   AST 24  --   --   --   --   ALT 25  --   --   --   --   ALKPHOS 89  --   --   --   --   BILITOT 0.7  --   --   --   --   GFRNONAA >60   < > 27* 24* 45*  ANIONGAP 6   < > 15 11 8    < > = values in this interval not displayed.    Lipids  Recent Labs  Lab 11/27/22 0432  TRIG 241*    Hematology Recent Labs  Lab 11/26/22 0925 11/27/22 0432 11/28/22 0432  WBC 14.6* 13.2* 12.5*  RBC 5.10 4.45 3.73*  HGB 14.8 12.8* 10.7*  HCT 44.0 38.0* 32.5*  MCV 86.3 85.4 87.1  MCH 29.0 28.8 28.7  MCHC 33.6 33.7 32.9  RDW 13.2 13.3 13.2  PLT 409* 366 326   Thyroid No results for input(s): "TSH", "FREET4" in the last 168 hours.  BNPNo results for input(s): "BNP", "PROBNP" in the last 168 hours.  DDimer No results for input(s): "DDIMER" in the last 168 hours.   Radiology    DG Chest Port 1 View  Result Date: 11/28/2022 CLINICAL DATA:  [id number] with ventilator dependent respiratory failure and ARF. EXAM: PORTABLE CHEST 1 VIEW COMPARISON:   Portable chest 11/26/2022 FINDINGS: 5:26 a.m. ETT tip is 3 cm from the carina. NGT passes into the stomach but neither the tip or side hole are filmed. Mild cardiomegaly. There is increased central vascular prominence but no overt edema. There is increased small to moderate left pleural effusion and overlying consolidation or atelectasis in the left lower lobe. The remaining hypoexpanded lungs are generally clear. There is stable mediastinal widening. New right PICC in place terminates at about the superior cavoatrial junction. There are overlying monitor wires. IMPRESSION: 1. Increased small to moderate left pleural effusion and overlying consolidation or atelectasis in the left lower lobe. 2. Increased central vascular prominence but no overt edema. 3. Stable mediastinal widening. 4. Support apparatus as above. Electronically Signed   By: 11/28/2022 M.D.   On: 11/28/2022 06:26   DG Abd 2 Views  Result Date: 11/27/2022 CLINICAL DATA:  [id number] Ileus, postoperative ([facility]) 03-23-1984 EXAM: ABDOMEN - 2 VIEW COMPARISON:  X-ray abdomen 11/26/2022 FINDINGS: Enteric tube with tip overlying the expected region of the gastric lumen. Persistent gaseous dilatation of the large and small bowel. Limited evaluation for free air. Right hemidiaphragm collimated off view on the semi upright view. No radio-opaque calculi or other significant radiographic abnormality is seen. IMPRESSION: 1. Enteric tube in good position. 2. Persistent gaseous dilatation of the small and large bowel consistent with known ileus. Electronically Signed   By: 11/28/2022 M.D.   On: 11/27/2022 13:38   11/29/2022 EKG SITE RITE  Result Date: 11/27/2022 If Site Rite image not attached, placement could not be confirmed due to current cardiac rhythm.  DG Abd Portable 1V  Result Date: 11/26/2022 CLINICAL DATA:  NG tube placement. EXAM: PORTABLE ABDOMEN - 1 VIEW COMPARISON:  11/25/2022 FINDINGS: The NG tube tip is in the body region of the stomach.  Improved bowel gas pattern with less distention of the small bowel. IMPRESSION: NG tube tip is in the body region of the stomach. Electronically Signed   By: 14/11/2022 M.D.   On: 11/26/2022 11:08  Cardiac Studies   11/2022 echo 1. Left ventricular ejection fraction, by estimation, is 70 to 75%. The  left ventricle has hyperdynamic function. The left ventricle has no  regional wall motion abnormalities. There is mild left ventricular  hypertrophy. Left ventricular diastolic  parameters are consistent with Grade I diastolic dysfunction (impaired  relaxation).   2. Right ventricular systolic function is normal. The right ventricular  size is normal. Tricuspid regurgitation signal is inadequate for assessing  PA pressure.   3. The mitral valve is grossly normal, mildly calcified. Trivial mitral  valve regurgitation.   4. The aortic valve is tricuspid. Aortic valve regurgitation is not  visualized. Aortic valve mean gradient measures 4.0 mmHg.   5. Unable to estimate CVP.     Patient Profile     EMMETTE KATT is a 56 y.o. male with a history of hypertension, hyperlipidemia, GERD, OSA/OHS on nightly BiPAP, and obesity who is being seen today for the evaluation of atrial fibrillation with RVR at the request of Dr. Sherryll Burger.   Assessment & Plan    1.Postop afib - started on IV amio 12/12 after low bp's on IV dilt - CHADS2Vasc score is 1, anticoag not indicated  - maintaing SR last few days, will d/c amio drip. Do not need to transition to oral, If recurrent afib can restart drip but appears has stabilized, drive for afib should also improve as systemic illness resolves.    2.  Incarcerated umbilical hernia status post surgical mesh repair on December 9 and with postoperative ileus and partial obstructive symptoms.  Has had recent bowel decompression with NG tube and abdomen remains distended.   3.  OSA/OHS with nightly BiPAP use.   4.  Mixed hyperlipidemia, treated with Lipitor.    5.  Essential hypertension, as outpatient on Aldactone and Hyzaar.  Antihypertensives currently held due to hypotension in the setting of current comorbid illness.  6 Respiraotry failure - vent management per CCM   No additional cardiology recs at this time, we will sign off inpatient care. F/u 4 weeks, may consider outpatient monitor but if asymptomatic likely just follow clinically.     For questions or updates, please contact [facility] Please consult [email or web address] for contact info under        Signed, Dina Rich, MD  11/28/2022, 8:51 AM

## 2022-11-28 NOTE — Plan of Care (Signed)

## 2022-11-28 NOTE — Progress Notes (Signed)
Began weaning CP/PSV at this time on 8/5 with 40%. He is a little tachypneic but getting good volumes. RT will monitor for about 30 minutes and reassess for extubation.

## 2022-11-28 NOTE — Progress Notes (Signed)
NAME:  Kyle Burns, MRN:  [id number], DOB:  [birth year], LOS: 6 ADMISSION DATE:  11/22/2022, CONSULTATION DATE:  11/28/2022  REFERRING MD:  Lanice Shirts, CHIEF COMPLAINT: Respiratory failure  History of Present Illness:  56 year old man with OSA/OHS admitted for small bowel obstruction due to incarcerated umbilical hernia.  He underwent open umbilical hernia repair with mesh placement.  Postop day 3 he developed abdominal distention and atrial fibrillation/RVR.  NG tube was placed and 3 L of fluid aspirated.  He required IV Cardizem and then amiodarone bolus and drip also digoxin 0.25 mg IV.  He developed dyspnea and was started on IV Zosyn due to concern for aspiration. 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 12/13 ETT >>  Interim History / Subjective:   Remains critically ill -on lower dose of Levophed, 3 mics Sedated with propofol and low-dose fentanyl drip at 50 mics Remains in sinus rhythm, on amiodarone drip Urine output improved to 2.4 L. He had 2 bowel movements yesterday  Objective   Blood pressure 105/60, pulse 77, temperature 98.7 F (37.1 C), temperature source Axillary, resp. rate (!) 24, height 5\' 11"  (1.803 m), weight 127.3 kg, SpO2 97 %.    Vent Mode: CPAP;PSV FiO2 (%):  [40 %-50 %] 40 % Set Rate:  [20 bmp] 20 bmp Vt Set:  [550 mL] 550 mL PEEP:  [5 cmH20] 5 cmH20 Pressure Support:  [13  cmH20] 13 cmH20 Plateau Pressure:  [18 cmH20-20 cmH20] 19 cmH20   Intake/Output Summary (Last 24 hours) at 11/28/2022 1300 Last data filed at 11/28/2022 1236 Gross per 24 hour  Intake 4595.63 ml  Output 3300 ml  Net 1295.63 ml    Filed Weights   11/27/22 0402 11/28/22 0530 11/28/22 1114  Weight: 129.7 kg 127.3 kg 127.3 kg    Examination: General: Obese man, orally intubated, sedated, no distress HENT: No pallor, icterus, no JVD Lungs: Decreased breath sounds bilateral, no accessory muscle use Cardiovascular: S1-S2 distant Abdomen: Distended, soft, no guarding bowel sounds ++, no guarding, ecchymosis around umbilicus Extremities: No deformity, no edema Neuro: Sedate, RASS -1   Labs show mild leukocytosis, hypokalemia persists at 3.2, decreasing BUN/creatinine to 57/1.7 X-ray abdomen 12/14 shows large gastric bubble with dilated small bowel loops Chest x-ray 12/15 independently reviewed shows ET tube in position, elevated left hemidiaphragm, left lower lobe infiltrate versus effusion  Resolved Hospital Problem list     Assessment & Plan:  Acute respiratory failure with hypoxia , related to abdominal distention Underlying OSA/OHS Known left hemidiaphragm paralysis Serratia HAP -feel left lower lobe infiltrate is likely related to HAP or elevated left hemidiaphragm rather than effusion  -Continue low tidal volume ventilation -Tolerating spontaneous breathing trials but will hold off extubation until bowel distention improves and clear that he will not need NG to suction because likely he will need BiPAP during sleep , hopeful to extubate by tomorrow -Simplify antibiotics for Serratia to ceftriaxone -provide tracheobronchial toilet while  intubated  Septic shock -Related to abdominal issues and sedation medications/propofol -Taper Levophed to off -Attempt to lower propofol for goal RASS -1  -minimize fentanyl use due to ileus  AKI -related to above, improving Supplement  hypokalemia aggressively. Normal saline at 100 cc an hour Trend BMET and urine output  Atrial fibrillation/RVR -converted to sinus rhythm -Related to respiratory worsening -Echo shows normal LVEF, hyperdynamic, grade 1 diastolic dysfunction -Can DC amiodarone  Small bowel obstruction vs post op ileus/incarcerated hernia repair -Surgery following, NG tube to LIS -May need TNA vs trickle feeds now that bowel function is returning  Best Practice (right click and "Reselect all SmartList Selections" daily)   Diet/type: NPO DVT prophylaxis: prophylactic heparin  GI prophylaxis: PPI Lines: N/A Foley:  Yes, and it is still needed Code Status:  full code Last date of multidisciplinary goals of care discussion [mother at bedside]  Labs   CBC: Recent Labs  Lab 11/22/22 0607 11/22/22 0635 11/24/22 0354 11/25/22 0422 11/26/22 0925 11/27/22 0432 11/28/22 0432  WBC 14.5*   < > 10.2 14.3* 14.6* 13.2* 12.5*  NEUTROABS 12.2*  --   --   --  10.3*  --   --   HGB 14.9   < > 14.6 15.9 14.8 12.8* 10.7*  HCT 46.0   < > 44.3 48.6 44.0 38.0* 32.5*  MCV 88.6   < > 87.9 88.0 86.3 85.4 87.1  PLT 323   < > 322 398 409* 366 326   < > = values in this interval not displayed.     Basic Metabolic Panel: Recent Labs  Lab 11/23/22 0346 11/25/22 0422 11/26/22 0342 11/27/22 0432 11/28/22 0432  NA 137 139 137 130* 135  K 3.7 3.5 3.0* 3.1* 3.2*  CL 101 100 94* 93* 100  CO2 29 26 28 26 27   GLUCOSE 121* 162* 167* 140* 125*  BUN 17 38* 64* 78* 57*  CREATININE 1.06 1.58* 2.66* 2.89* 1.73*  CALCIUM 8.8* 9.5 8.9 8.1* 7.5*  MG  --  2.9* 3.7* 3.8* 3.6*  PHOS  --   --   --   --  2.8    GFR: Estimated Creatinine Clearance: 63.3 mL/min (A) (by C-G formula based on SCr of 1.73 mg/dL (H)). Recent Labs  Lab 11/25/22 0422 11/26/22 0925 11/27/22 0432 11/28/22 0432  WBC 14.3* 14.6* 13.2* 12.5*     Liver Function Tests: Recent Labs  Lab 11/22/22 0607  AST 24  ALT 25  ALKPHOS 89  BILITOT 0.7   PROT 8.0  ALBUMIN 4.5    Recent Labs  Lab 11/22/22 0607  LIPASE 45    No results for input(s): "AMMONIA" in the last 168 hours.  ABG    Component Value Date/Time   PHART 7.45 11/26/2022 0859   PCO2ART 43 11/26/2022 0859   PO2ART 103 11/26/2022 0859   HCO3 29.9 (H) 11/26/2022 0859   TCO2 28 11/22/2022 0635   O2SAT 97.6 11/26/2022 0859     Coagulation Profile: No results for input(s): "INR", "PROTIME" in the last 168 hours.  Cardiac Enzymes: No results for input(s): "CKTOTAL", "CKMB", "CKMBINDEX", "TROPONINI" in the last 168 hours.  HbA1C: HB A1C (BAYER DCA - WAIVED)  Date/Time Value Ref Range Status  11/04/2021 04:18 PM 5.7 (H) 4.8 - 5.6 % Final    Comment:             Prediabetes: 5.7 - 6.4          Diabetes: >6.4  Glycemic control for adults with diabetes: <7.0     CBG: Recent Labs  Lab 11/27/22 1958 11/28/22 0032 11/28/22 0512 11/28/22 0707 11/28/22 1106  GLUCAP 127* 122* 123* 116* 136*    Critical care time: 36 minutes     Kara Mead MD. FCCP. [facility] Pager : [phone]  If no response to pager , please call [phone] until 7 pm After 7:00 pm call Elink  [phone]   11/28/2022  .

## 2022-11-29 ENCOUNTER — Inpatient Hospital Stay (HOSPITAL_COMMUNITY): Payer: No Typology Code available for payment source

## 2022-11-29 DIAGNOSIS — K420 Umbilical hernia with obstruction, without gangrene: Secondary | ICD-10-CM | POA: Diagnosis not present

## 2022-11-29 LAB — COMPREHENSIVE METABOLIC PANEL
ALT: 58 U/L — ABNORMAL HIGH (ref 0–44)
AST: 60 U/L — ABNORMAL HIGH (ref 15–41)
Albumin: 2.7 g/dL — ABNORMAL LOW (ref 3.5–5.0)
Alkaline Phosphatase: 76 U/L (ref 38–126)
Anion gap: 8 (ref 5–15)
BUN: 53 mg/dL — ABNORMAL HIGH (ref 6–20)
CO2: 26 mmol/L (ref 22–32)
Calcium: 7.9 mg/dL — ABNORMAL LOW (ref 8.9–10.3)
Chloride: 103 mmol/L (ref 98–111)
Creatinine, Ser: 1.61 mg/dL — ABNORMAL HIGH (ref 0.61–1.24)
GFR, Estimated: 49 mL/min — ABNORMAL LOW (ref >60)
Glucose, Bld: 111 mg/dL — ABNORMAL HIGH (ref 70–99)
Potassium: 3.6 mmol/L (ref 3.5–5.1)
Sodium: 137 mmol/L (ref 135–145)
Total Bilirubin: 0.6 mg/dL (ref 0.3–1.2)
Total Protein: 6.2 g/dL — ABNORMAL LOW (ref 6.5–8.1)

## 2022-11-29 LAB — TRIGLYCERIDES: Triglycerides: 288 mg/dL — ABNORMAL HIGH (ref <150)

## 2022-11-29 LAB — PHOSPHORUS: Phosphorus: 3.6 mg/dL (ref 2.5–4.6)

## 2022-11-29 LAB — CBC
HCT: 34.6 % — ABNORMAL LOW (ref 39.0–52.0)
Hemoglobin: 11.3 g/dL — ABNORMAL LOW (ref 13.0–17.0)
MCH: 29 pg (ref 26.0–34.0)
MCHC: 32.7 g/dL (ref 30.0–36.0)
MCV: 88.7 fL (ref 80.0–100.0)
Platelets: 310 10*3/uL (ref 150–400)
RBC: 3.9 MIL/uL — ABNORMAL LOW (ref 4.22–5.81)
RDW: 13.2 % (ref 11.5–15.5)
WBC: 12.6 10*3/uL — ABNORMAL HIGH (ref 4.0–10.5)
nRBC: 0 % (ref 0.0–0.2)

## 2022-11-29 LAB — GLUCOSE, CAPILLARY
Glucose-Capillary: 107 mg/dL — ABNORMAL HIGH (ref 70–99)
Glucose-Capillary: 107 mg/dL — ABNORMAL HIGH (ref 70–99)
Glucose-Capillary: 115 mg/dL — ABNORMAL HIGH (ref 70–99)
Glucose-Capillary: 121 mg/dL — ABNORMAL HIGH (ref 70–99)
Glucose-Capillary: 121 mg/dL — ABNORMAL HIGH (ref 70–99)
Glucose-Capillary: 130 mg/dL — ABNORMAL HIGH (ref 70–99)

## 2022-11-29 LAB — MAGNESIUM: Magnesium: 3.8 mg/dL — ABNORMAL HIGH (ref 1.7–2.4)

## 2022-11-29 MED ORDER — BISACODYL 10 MG RE SUPP
10.0000 mg | Freq: Two times a day (BID) | RECTAL | Status: DC
  Administered 2022-11-29 – 2022-12-03 (×8): 10 mg via RECTAL
  Filled 2022-11-29 (×9): qty 1

## 2022-11-29 MED ORDER — IPRATROPIUM BROMIDE 0.02 % IN SOLN
RESPIRATORY_TRACT | Status: AC
  Administered 2022-11-29: 0.5 mg
  Filled 2022-11-29: qty 2.5

## 2022-11-29 MED ORDER — POTASSIUM CHLORIDE 10 MEQ/100ML IV SOLN
10.0000 meq | INTRAVENOUS | Status: AC
  Administered 2022-11-29 (×3): 10 meq via INTRAVENOUS
  Filled 2022-11-29 (×3): qty 100

## 2022-11-29 MED ORDER — LEVALBUTEROL HCL 0.63 MG/3ML IN NEBU
0.6300 mg | INHALATION_SOLUTION | Freq: Four times a day (QID) | RESPIRATORY_TRACT | Status: DC
  Administered 2022-11-29 – 2022-12-04 (×21): 0.63 mg via RESPIRATORY_TRACT
  Filled 2022-11-29 (×21): qty 3

## 2022-11-29 MED ORDER — TRAVASOL 10 % IV SOLN
INTRAVENOUS | Status: AC
  Filled 2022-11-29: qty 1440

## 2022-11-29 MED ORDER — IPRATROPIUM BROMIDE 0.02 % IN SOLN
0.5000 mg | Freq: Four times a day (QID) | RESPIRATORY_TRACT | Status: DC
  Administered 2022-11-29 – 2022-12-04 (×21): 0.5 mg via RESPIRATORY_TRACT
  Filled 2022-11-29 (×21): qty 2.5

## 2022-11-29 NOTE — Progress Notes (Signed)
Patient did exceptionally well with WUA. Sedation was cut by 50% initially and then turned off completely. Patient was not not in any distress. Followed commands. Vital signed remained stable and patient was in synchrony with the vent. Patient noted with no bowel sounds. On-call GI provider (Dr. Henreitta Leber) made aware via secure chat. Awaiting response.

## 2022-11-29 NOTE — Progress Notes (Signed)
PROGRESS NOTE    Kyle REGEHR  [id number] DOB: [birth year] DOA: 11/22/2022 PCP: Janora Norlander, DO   Brief Narrative:    Kyle Burns  is a 56 y.o. male, with past medical history of hypertension, asthma, GERD, hyperlipidemia, O SA/OHS, BiPAP dependent with oxygen at nighttime. -Patient presents to ED secondary to complaints of abdominal pain, developed yesterday, started having generalized abdominal pain, nausea, after eating [place] food, he denies any vomiting, he does report dry heaving as well, he denies any fever, chills, no dysuria or polyuria, he denies any obstipation or constipation, no diarrhea, no bright red blood per rectum, no chest pain, orts pain has worsened overnight which prompted him to come to ED -Patient underwent open umbilical hernia repair with placement of mesh on 12/9.  He is noted to have worsening abdominal distention suspected to be related to ileus and now has worsening respiratory distress and hypoxemia requiring intubation on 12/13.  He also developed atrial fibrillation with RVR and has remained in sinus rhythm and therefore amiodarone infusion has been discontinued.  Ileus appears to be slowly improving with increase in bowel movements and less NG tube output noted.  TPN initiated 12/15.  Continues to have persistent ileus in the setting of large umbilical hematoma noted on CT abdomen 12/16.  Assessment & Plan:   Principal Problem:   Incarcerated umbilical hernia Active Problems:   Hyperlipidemia   Obstructive sleep apnea   Gastroesophageal reflux disease   BMI 40.0-44.9, adult (HCC)   Mild intermittent asthma   Nodule of right lung   Acute respiratory failure with hypoxia (HCC)   Atrial fibrillation with rapid ventricular response (HCC)   Small bowel obstruction (HCC)   Septic shock (HCC)   AKI (acute kidney injury) ([facility])  Assessment and Plan:   Acute hypoxemic respiratory failure secondary to worsening abdominal  distention -Underlying OSA/OHS with known left hemidiaphragm paralysis -Intubated 12/13 -Appreciate ongoing PCCM evaluation, holding off on extubation until bowel distention improves   Septic shock with fever -Hypotension related to sedation and worsening abdominal distention as well as concern for aspiration pneumonia -Levophed has been weaned off -Continue IV Zosyn for possible aspiration coverage, will not de-escalate at this time -Chest x-ray appears to look okay 12/16, blood cultures ordered -Continue to monitor a.m. labs as well as lactic acid -CT abdomen and pelvis 12/16 with large hematoma and ongoing ileus   Paroxysmal atrial fibrillation/RVR-now in sinus rhythm -Now in sinus rhythm, related to respiratory condition -2D echocardiogram with normal LVEF and grade 1 diastolic dysfunction -Amiodarone discontinued -Appreciate cardiology evaluation -No need for anticoagulation at this point   AKI, nonoliguric, improving -Continue IV fluid with TPN ongoing -Monitor urine output -Avoid nephrotoxic agents -A.m. labs   Incarcerated umbilical hernia/SBO s/p open umbilical hernia repair with mesh 12/9 now with ileus -Appears to slowly be improving -Continue n.p.o. with NG tube to LIS -Possible aspiration with recurrent vomiting and worsening abdominal distention -May require repeat imaging especially with potential worsening hematoma -Potassium goal of 4.0 and magnesium 2.0 -TPN to be initiated 12/15 per general surgery recommendations -CT abdomen pelvis with large hematoma which may require eventual washout, persistent ileus   History of hypertension -Currently hypotensive and on pressors, holding home medications   Obstructive sleep apnea/obesity hypoventilation syndrome -Currently intubated -Plan to extubate to BiPAP   Dyslipidemia -Statin on discharge   GERD -PPI   History of asthma -Appears to be mild and intermittent -Continue as needed bronchodilator  management. -Continue the use of Singulair. -  No wheezing on exam.   Class III obesity -Body mass index is 39.46 kg/m. -Low-calorie diet, portion control and increase physical activity discussed with patient.   Incidental 4 mm right lung nodule -Outpatient follow-up with pulmonary service recommended.      DVT prophylaxis:Lovenox Code Status: Full Family Communication: None at bedside; Mother called by [facility] 12/16 Disposition Plan:  Status is: Inpatient Remains inpatient appropriate because: Need for IV medications   Consultants:  Gen Surg Cardiology Pulm   Procedures:  Open umbilical hernia repair with mesh Intubation 12/13   Antimicrobials:  Anti-infectives (From admission, onward)    Start     Dose/Rate Route Frequency Ordered Stop   11/27/22 1600  piperacillin-tazobactam (ZOSYN) IVPB 3.375 g        3.375 g 12.5 mL/hr over 240 Minutes Intravenous Every 8 hours 11/27/22 1525     11/22/22 1400  ceFAZolin (ANCEF) IVPB 3g/100 mL premix        3 g 200 mL/hr over 30 Minutes Intravenous  Once 11/22/22 1202 11/22/22 1245   11/22/22 1208  ceFAZolin (ANCEF) 3-0.9 GM/100ML-% IVPB       Note to Pharmacy: Elvina Sidle S: cabinet override      11/22/22 1208 11/22/22 1310   11/22/22 1200  ceFAZolin (ANCEF) IVPB 2g/100 mL premix  Status:  Discontinued        2 g 200 mL/hr over 30 Minutes Intravenous Every 8 hours 11/22/22 1149 11/22/22 1202      Subjective: Patient seen and evaluated today with no new issues noted overnight.  Noted to still have high NG tube output and did have a bowel movement yesterday and no bowel sounds.  Low-grade fevers noted.  Objective: Vitals:   11/29/22 1130 11/29/22 1134 11/29/22 1146 11/29/22 1320  BP: 130/78  127/80   Pulse: 76 77 73   Resp: (!) 25 (!) 24 (!) 30   Temp:  99.6 F (37.6 C)    TempSrc:  Axillary    SpO2: 97% 100% 97% 98%  Weight:      Height:        Intake/Output Summary (Last 24 hours) at 11/29/2022 1341 Last data filed at  11/29/2022 1000 Gross per 24 hour  Intake 2319.07 ml  Output 3231 ml  Net -911.93 ml   Filed Weights   11/27/22 0402 11/28/22 0530 11/28/22 1114  Weight: 129.7 kg 127.3 kg 127.3 kg    Examination:  General exam: Appears calm and comfortable  Respiratory system: Clear to auscultation. Respiratory effort normal.  Currently intubated and sedated Cardiovascular system: S1 & S2 heard, RRR.  Gastrointestinal system: Abdomen is distended with large umbilical hematoma as noted Central nervous system: Sedated Extremities: No edema Skin: No significant lesions noted Psychiatry: Cannot be assessed    Data Reviewed: I have personally reviewed following labs and imaging studies  CBC: Recent Labs  Lab 11/25/22 0422 11/26/22 0925 11/27/22 0432 11/28/22 0432 11/29/22 0418  WBC 14.3* 14.6* 13.2* 12.5* 12.6*  NEUTROABS  --  10.3*  --   --   --   HGB 15.9 14.8 12.8* 10.7* 11.3*  HCT 48.6 44.0 38.0* 32.5* 34.6*  MCV 88.0 86.3 85.4 87.1 88.7  PLT 398 409* 366 326 310   Basic Metabolic Panel: Recent Labs  Lab 11/25/22 0422 11/26/22 0342 11/27/22 0432 11/28/22 0432 11/29/22 0418  NA 139 137 130* 135 137  K 3.5 3.0* 3.1* 3.2* 3.6  CL 100 94* 93* 100 103  CO2 26 28 26 27 26   GLUCOSE  162* 167* 140* 125* 111*  BUN 38* 64* 78* 57* 53*  CREATININE 1.58* 2.66* 2.89* 1.73* 1.61*  CALCIUM 9.5 8.9 8.1* 7.5* 7.9*  MG 2.9* 3.7* 3.8* 3.6* 3.8*  PHOS  --   --   --  2.8 3.6   GFR: Estimated Creatinine Clearance: 68 mL/min (A) (by C-G formula based on SCr of 1.61 mg/dL (H)). Liver Function Tests: Recent Labs  Lab 11/29/22 0418  AST 60*  ALT 58*  ALKPHOS 76  BILITOT 0.6  PROT 6.2*  ALBUMIN 2.7*   No results for input(s): "LIPASE", "AMYLASE" in the last 168 hours. No results for input(s): "AMMONIA" in the last 168 hours. Coagulation Profile: No results for input(s): "INR", "PROTIME" in the last 168 hours. Cardiac Enzymes: No results for input(s): "CKTOTAL", "CKMB", "CKMBINDEX",  "TROPONINI" in the last 168 hours. BNP (last 3 results) No results for input(s): "PROBNP" in the last 8760 hours. HbA1C: No results for input(s): "HGBA1C" in the last 72 hours. CBG: Recent Labs  Lab 11/28/22 2042 11/29/22 0011 11/29/22 0432 11/29/22 0754 11/29/22 1134  GLUCAP 114* 107* 107* 121* 121*   Lipid Profile: Recent Labs    11/27/22 0432 11/29/22 0418  TRIG 241* 288*   Thyroid Function Tests: No results for input(s): "TSH", "T4TOTAL", "FREET4", "T3FREE", "THYROIDAB" in the last 72 hours. Anemia Panel: No results for input(s): "VITAMINB12", "FOLATE", "FERRITIN", "TIBC", "IRON", "RETICCTPCT" in the last 72 hours. Sepsis Labs: No results for input(s): "PROCALCITON", "LATICACIDVEN" in the last 168 hours.  Recent Results (from the past 240 hour(s))  MRSA Next Gen by PCR, Nasal     Status: None   Collection Time: 11/22/22  5:25 PM   Specimen: Nasal Mucosa; Nasal Swab  Result Value Ref Range Status   MRSA by PCR Next Gen NOT DETECTED NOT DETECTED Final    Comment: (NOTE) The GeneXpert MRSA Assay (FDA approved for NASAL specimens only), is one component of a comprehensive MRSA colonization surveillance program. It is not intended to diagnose MRSA infection nor to guide or monitor treatment for MRSA infections. Test performance is not FDA approved in patients less than 17 years old. Performed at [facility], [address], [place], [place] [address]   Culture, Respiratory w Gram Stain     Status: None (Preliminary result)   Collection Time: 11/26/22  3:58 PM   Specimen: Tracheal Aspirate; Respiratory  Result Value Ref Range Status   Specimen Description   Final    TRACHEAL ASPIRATE Performed at [facility], [address] [place], [place] [address]    Special Requests   Final    NONE Performed at [address] [place], [place] [address]    Gram Stain   Final    RARE WBC PRESENT, PREDOMINANTLY PMN MODERATE GRAM NEGATIVE RODS    Culture   Final     ABUNDANT SERRATIA MARCESCENS FEW GRAM NEGATIVE RODS IDENTIFICATION AND SUSCEPTIBILITIES TO FOLLOW Performed at [facility], [place] [address] [place], [place] [address]    Report Status PENDING  Incomplete   Organism ID, Bacteria SERRATIA MARCESCENS  Final      Susceptibility   Serratia marcescens - MIC*    CEFAZOLIN >=64 RESISTANT Resistant     CEFEPIME <=0.12 SENSITIVE Sensitive     CEFTAZIDIME <=1 SENSITIVE Sensitive     CEFTRIAXONE <=0.25 SENSITIVE Sensitive     CIPROFLOXACIN <=0.25 SENSITIVE Sensitive     GENTAMICIN <=1 SENSITIVE Sensitive     TRIMETH/SULFA <=20 SENSITIVE Sensitive     *  ABUNDANT SERRATIA MARCESCENS         Radiology Studies: CT ABDOMEN PELVIS WO CONTRAST  Result Date: 11/29/2022 CLINICAL DATA:  56 year old male with abdominal pain following umbilical hernia repair on 11/22/2022. Hematoma overlying the umbilical hernia repair. EXAM: CT ABDOMEN AND PELVIS WITHOUT CONTRAST TECHNIQUE: Multidetector CT imaging of the abdomen and pelvis was performed following the standard protocol without IV contrast. RADIATION DOSE REDUCTION: This exam was performed according to the departmental dose-optimization program which includes automated exposure control, adjustment of the mA and/or kV according to patient size and/or use of iterative reconstruction technique. COMPARISON:  11/22/2022 CT FINDINGS: Please note that parenchymal and vascular abnormalities may be missed as intravenous contrast was not administered. Lower chest: RIGHT LOWER lobe consolidation/atelectasis noted. Hepatobiliary: Hepatic steatosis identified without focal hepatic abnormality. The gallbladder is unremarkable. There is no evidence of intrahepatic or extrahepatic biliary dilatation. Pancreas: Unremarkable Spleen: Unremarkable Adrenals/Urinary Tract: The kidneys and adrenal glands are unremarkable. Foley catheter is present within the bladder. Stomach/Bowel: An NG tube is present within the mid  stomach. There are mildly distended proximal and mid small bowel loops with nondistended distal small bowel loops. No discrete transition point is identified. The appendix is unremarkable. No definite bowel wall thickening identified. Vascular/Lymphatic: No significant vascular findings are present. No enlarged abdominal or pelvic lymph nodes. Reproductive: Unremarkable Other: A 9.1 x 6.6 x 11.5 cm high density structure in the periumbilical subcutaneous tissues is compatible with a hematoma. Small bowel loops are noted extending to the undersurface of the abdominal wall in this area, but it is difficult to determine if there is recurrence of hernia in this area. There is no evidence of discrete transition change of bowel in this region however. No evidence of ascites, pneumoperitoneum or other focal collection. Musculoskeletal: Bilateral inguinal hernia repair noted with moderate RIGHT inguinal hernia containing fat again noted. IMPRESSION: 1. 9.1 x 6.6 x 0000000 cm paraumbilical subcutaneous hematoma. Small bowel loops are noted extending to the undersurface of the abdominal wall in this area, but it is difficult to determine if there is recurrence of hernia in this area. No evidence of discrete transition change of bowel in this region however. Consider follow-up CT with enteric contrast, as clinically indicated. 2. Mildly distended proximal and mid small bowel loops with nondistended distal small bowel loops. No discrete transition point identified. Favor ileus over low-grade partial small bowel obstruction. 3. RIGHT LOWER lobe consolidation/atelectasis. 4. Hepatic steatosis. 5. Bilateral inguinal hernia repair with moderate RIGHT inguinal hernia containing fat. Electronically Signed   By: Margarette Canada M.D.   On: 11/29/2022 13:20   DG CHEST PORT 1 VIEW  Result Date: 11/29/2022 CLINICAL DATA:  Respiratory failure EXAM: PORTABLE CHEST 1 VIEW COMPARISON:  11/28/2022 and prior studies FINDINGS: This is a low volume  study. Endotracheal tube with tip 2.5 cm above the carina, SUPERIOR cavoatrial junction and enteric tube with tip difficult to visualize. Significant decrease in LEFT LOWER lung opacities and probable small LEFT effusion noted. There is no evidence of pneumothorax. No acute bony abnormalities are identified. IMPRESSION: Significant decrease in LEFT LOWER lung opacities and probable small LEFT effusion. Electronically Signed   By: Margarette Canada M.D.   On: 11/29/2022 09:12   DG Chest Port 1 View  Result Date: 11/28/2022 CLINICAL DATA:  5626 with ventilator dependent respiratory failure and ARF. EXAM: PORTABLE CHEST 1 VIEW COMPARISON:  Portable chest 11/26/2022 FINDINGS: 5:26 a.m. ETT tip is 3 cm from the carina. NGT passes into the stomach  but neither the tip or side hole are filmed. Mild cardiomegaly. There is increased central vascular prominence but no overt edema. There is increased small to moderate left pleural effusion and overlying consolidation or atelectasis in the left lower lobe. The remaining hypoexpanded lungs are generally clear. There is stable mediastinal widening. New right PICC in place terminates at about the superior cavoatrial junction. There are overlying monitor wires. IMPRESSION: 1. Increased small to moderate left pleural effusion and overlying consolidation or atelectasis in the left lower lobe. 2. Increased central vascular prominence but no overt edema. 3. Stable mediastinal widening. 4. Support apparatus as above. Electronically Signed   By: Telford Nab M.D.   On: 11/28/2022 06:26        Scheduled Meds:  bisacodyl  10 mg Rectal BID   Chlorhexidine Gluconate Cloth  6 each Topical Daily   docusate  100 mg Per Tube BID   enoxaparin (LOVENOX) injection  60 mg Subcutaneous Q24H   fluticasone  1 spray Each Nare Daily   insulin aspart  0-20 Units Subcutaneous Q6H   ipratropium  0.5 mg Nebulization Q6H   levalbuterol  0.63 mg Nebulization Q6H   montelukast  10 mg Per Tube QHS     morphine injection  2 mg Intravenous Once   mouth rinse  15 mL Mouth Rinse Q2H   pantoprazole (PROTONIX) IV  40 mg Intravenous Q12H   pneumococcal 23 valent vaccine  0.5 mL Intramuscular Tomorrow-1000   sodium chloride flush  10-40 mL Intracatheter Q12H   Continuous Infusions:  sodium chloride     sodium chloride 50 mL/hr at 11/28/22 1851   fentaNYL infusion INTRAVENOUS Stopped (11/28/22 1058)   norepinephrine (LEVOPHED) Adult infusion Stopped (11/28/22 0847)   piperacillin-tazobactam (ZOSYN)  IV 3.375 g (11/29/22 0629)   propofol (DIPRIVAN) infusion 20 mcg/kg/min (11/29/22 1217)   TPN ADULT (ION) 50 mL/hr at 11/28/22 1851   TPN ADULT (ION)       LOS: 7 days    Time spent: 35 minutes    Apphia Cropley Darleen Crocker, DO [facility]  If 7PM-7AM, please contact night-coverage [email or web address] 11/29/2022, 1:41 PM

## 2022-11-29 NOTE — Plan of Care (Signed)

## 2022-11-29 NOTE — Progress Notes (Addendum)
[facility] Progress Note  7 Days Post-Op  Subjective: BM yesterday but not today and no bowel sounds. Hematoma over incision about the same based on photos from the mother and discussion with Dr. Robyne Peers.   Low grade fevers, is doing some weaning trials. BP and HR are better. Leukocytosis stable. On zosyn for possible PNA.   AKI improving.   Objective: Vital signs in last 24 hours: Temp:  [99.6 F (37.6 C)-100.5 F (38.1 C)] 99.6 F (37.6 C) (12/16 1134) Pulse Rate:  [59-80] 73 (12/16 1146) Resp:  [17-30] 30 (12/16 1146) BP: (86-130)/(52-80) 127/80 (12/16 1146) SpO2:  [95 %-100 %] 97 % (12/16 1146) FiO2 (%):  [35 %-40 %] 40 % (12/16 1200) Last BM Date : 11/28/22  Intake/Output from previous day: 12/15 0701 - 12/16 0700 In: 1792.5 [I.V.:1432.1; IV Piggyback:360.4] Out: 3056 [Urine:1405; Emesis/NG output:1650; Stool:1] Intake/Output this shift: Total I/O In: 1439.7 [I.V.:1146.2; IV Piggyback:293.4] Out: 400 [Emesis/NG output:400]  General appearance: alert and responds to voice and interacts Resp: remains ventilated  GI: distended, tympanic, incision with bruising and dermabond in place, slightly warm but no overt erythema just evolving bruising   Lab Results:  Recent Labs    11/28/22 0432 11/29/22 0418  WBC 12.5* 12.6*  HGB 10.7* 11.3*  HCT 32.5* 34.6*  PLT 326 310   BMET Recent Labs    11/28/22 0432 11/29/22 0418  NA 135 137  K 3.2* 3.6  CL 100 103  CO2 27 26  GLUCOSE 125* 111*  BUN 57* 53*  CREATININE 1.73* 1.61*  CALCIUM 7.5* 7.9*   PT/INR No results for input(s): "LABPROT", "INR" in the last 72 hours.  Studies/Results: DG CHEST PORT 1 VIEW  Result Date: 11/29/2022 CLINICAL DATA:  Respiratory failure EXAM: PORTABLE CHEST 1 VIEW COMPARISON:  11/28/2022 and prior studies FINDINGS: This is a low volume study. Endotracheal tube with tip 2.5 cm above the carina, SUPERIOR cavoatrial junction and enteric tube with tip difficult to  visualize. Significant decrease in LEFT LOWER lung opacities and probable small LEFT effusion noted. There is no evidence of pneumothorax. No acute bony abnormalities are identified. IMPRESSION: Significant decrease in LEFT LOWER lung opacities and probable small LEFT effusion. Electronically Signed   By: Harmon Pier M.D.   On: 11/29/2022 09:12   DG Chest Port 1 View  Result Date: 11/28/2022 CLINICAL DATA:  5626 with ventilator dependent respiratory failure and ARF. EXAM: PORTABLE CHEST 1 VIEW COMPARISON:  Portable chest 11/26/2022 FINDINGS: 5:26 a.m. ETT tip is 3 cm from the carina. NGT passes into the stomach but neither the tip or side hole are filmed. Mild cardiomegaly. There is increased central vascular prominence but no overt edema. There is increased small to moderate left pleural effusion and overlying consolidation or atelectasis in the left lower lobe. The remaining hypoexpanded lungs are generally clear. There is stable mediastinal widening. New right PICC in place terminates at about the superior cavoatrial junction. There are overlying monitor wires. IMPRESSION: 1. Increased small to moderate left pleural effusion and overlying consolidation or atelectasis in the left lower lobe. 2. Increased central vascular prominence but no overt edema. 3. Stable mediastinal widening. 4. Support apparatus as above. Electronically Signed   By: Almira Bar M.D.   On: 11/28/2022 06:26    Anti-infectives: Anti-infectives (From admission, onward)    Start     Dose/Rate Route Frequency Ordered Stop   11/27/22 1600  piperacillin-tazobactam (ZOSYN) IVPB 3.375 g        3.375 g  12.5 mL/hr over 240 Minutes Intravenous Every 8 hours 11/27/22 1525     11/22/22 1400  ceFAZolin (ANCEF) IVPB 3g/100 mL premix        3 g 200 mL/hr over 30 Minutes Intravenous  Once 11/22/22 1202 11/22/22 1245   11/22/22 1208  ceFAZolin (ANCEF) 3-0.9 GM/100ML-% IVPB       Note to Pharmacy: Lear Ng S: cabinet override       11/22/22 1208 11/22/22 1310   11/22/22 1200  ceFAZolin (ANCEF) IVPB 2g/100 mL premix  Status:  Discontinued        2 g 200 mL/hr over 30 Minutes Intravenous Every 8 hours 11/22/22 1149 11/22/22 1202       Assessment/Plan: Patient s/p incarcerated umbilical hernia repair with mesh 12/09 with Dr. Okey Dupre. Had post operative ileus and required NG and then required intubation for low lung volumes and possible PNA. Has been weaning off levophed and weaning on the vent. Bruising and likely hematoma over the hernia site stable.   CT abd/pelvis without contrast to assess repair and ensure no recurrence Will monitor the hematoma  Ileus NG in place Ventilated Adding suppository BID  TPN Zosyn for possible PNA  Discussed with Dr. Okey Dupre and DR. Manuella Ghazi and team. Updated mom at bedside.   LOS: 7 days    Virl Cagey 11/29/2022

## 2022-11-29 NOTE — Progress Notes (Signed)
RT resumed weaning at 10/5. Patient appears to be tolerating it without distress. Will continue to monitor and endorse.

## 2022-11-29 NOTE — Progress Notes (Addendum)
PHARMACY - TOTAL PARENTERAL NUTRITION CONSULT NOTE   Indication: Prolonged ileus  Patient Measurements: Height: 5\' 11"  (180.3 cm) Weight: 127.3 kg (280 lb 10.3 oz) IBW/kg (Calculated) : 75.3 TPN AdjBW (KG): 88.9 Body mass index is 39.14 kg/m. Usual Weight:   Assessment:   Glucose / Insulin: 107 - 136 Electrolytes: K 3.6   Mag 3.8 Renal: WNL Hepatic: TG 288 Intake / Output; MIVF: NS @ 50 mL/hr GI Imaging: GI Surgeries / Procedures:   Central access: PICC TPN start date: 12/15  Nutritional Goals: Goal TPN rate is 100 mL/hr (provides 156 g of protein and 2300 kcals per day)  Patient currently on propofol @ 30.2 mL/hr ==> ~797 kcal/day   RD Assessment: Estimated Needs Total Energy Estimated Needs: 2400 Total Protein Estimated Needs: 150-160 gr Total Fluid Estimated Needs: >2 liters daily  Current Nutrition:  NPO  Plan:  Increase TPN to 100 mL/hr at 1800- no lipids in TPN since patient is receiving ~797 kcal/day with propofol Electrolytes in TPN: Na 41mEq/L, K 8mEq/L, Ca 8mEq/L, Mg 32mEq/L, and Phos 38mmol/L. Cl:Ac 1:1 Add standard MVI and trace elements to TPN Initiate Resistant q6h SSI and adjust as needed  Stop MIVF  at 1800 Monitor TPN labs on Mon/Thurs,   9m, PharmD Clinical Pharmacist 11/29/2022 9:01 AM

## 2022-11-29 NOTE — Progress Notes (Signed)
Patient is doing well on CPAP?PS mode will most likely change over back to rest mode around midnight.

## 2022-11-29 NOTE — Progress Notes (Signed)
bp is 110/68 Map 81. Minimum prop at 15 no fentanyl but 1 bolus today. Large stool a few at 4:30om today.  Still vent weaning 10/5 since 8am  except for transport to CT.

## 2022-11-29 NOTE — Progress Notes (Signed)
Patient continues on vent weaning. No s/s of distress noted. Patient c/o pain to the foley. Foley was noted with uring bypassing and squirting out. Foley was removed and external cath placed and functioning without any difficulties. Provider made aware. Mom and sister are at the bedside with no additional questions.

## 2022-11-29 NOTE — Progress Notes (Signed)
[facility]  Large hematoma and ileus. No obvious recurrence. May need washout but will monitor for now. Dr. Robyne Peers aware. Updated mom.  Algis Greenhouse, MD

## 2022-11-30 ENCOUNTER — Encounter (HOSPITAL_COMMUNITY)
Admission: EM | Disposition: A | Discharge status: Home / Self Care | Payer: Self-pay | Source: Home / Self Care | Attending: Internal Medicine

## 2022-11-30 ENCOUNTER — Inpatient Hospital Stay (HOSPITAL_COMMUNITY): Payer: No Typology Code available for payment source | Admitting: Anesthesiology

## 2022-11-30 DIAGNOSIS — K91870 Postprocedural hematoma of a digestive system organ or structure following a digestive system procedure: Secondary | ICD-10-CM

## 2022-11-30 DIAGNOSIS — K420 Umbilical hernia with obstruction, without gangrene: Secondary | ICD-10-CM | POA: Diagnosis not present

## 2022-11-30 HISTORY — PX: HEMATOMA EVACUATION: SHX5118

## 2022-11-30 LAB — COMPREHENSIVE METABOLIC PANEL
ALT: 105 U/L — ABNORMAL HIGH (ref 0–44)
AST: 95 U/L — ABNORMAL HIGH (ref 15–41)
Albumin: 2.9 g/dL — ABNORMAL LOW (ref 3.5–5.0)
Alkaline Phosphatase: 89 U/L (ref 38–126)
Anion gap: 8 (ref 5–15)
BUN: 39 mg/dL — ABNORMAL HIGH (ref 6–20)
CO2: 23 mmol/L (ref 22–32)
Calcium: 8.5 mg/dL — ABNORMAL LOW (ref 8.9–10.3)
Chloride: 107 mmol/L (ref 98–111)
Creatinine, Ser: 1.16 mg/dL (ref 0.61–1.24)
GFR, Estimated: >60 mL/min (ref >60)
Glucose, Bld: 140 mg/dL — ABNORMAL HIGH (ref 70–99)
Potassium: 4.3 mmol/L (ref 3.5–5.1)
Sodium: 138 mmol/L (ref 135–145)
Total Bilirubin: 0.4 mg/dL (ref 0.3–1.2)
Total Protein: 6.7 g/dL (ref 6.5–8.1)

## 2022-11-30 LAB — CBC
HCT: 32.5 % — ABNORMAL LOW (ref 39.0–52.0)
Hemoglobin: 10.5 g/dL — ABNORMAL LOW (ref 13.0–17.0)
MCH: 28.9 pg (ref 26.0–34.0)
MCHC: 32.3 g/dL (ref 30.0–36.0)
MCV: 89.5 fL (ref 80.0–100.0)
Platelets: 331 10*3/uL (ref 150–400)
RBC: 3.63 MIL/uL — ABNORMAL LOW (ref 4.22–5.81)
RDW: 13.2 % (ref 11.5–15.5)
WBC: 14.6 10*3/uL — ABNORMAL HIGH (ref 4.0–10.5)
nRBC: 0 % (ref 0.0–0.2)

## 2022-11-30 LAB — GLUCOSE, CAPILLARY
Glucose-Capillary: 117 mg/dL — ABNORMAL HIGH (ref 70–99)
Glucose-Capillary: 125 mg/dL — ABNORMAL HIGH (ref 70–99)
Glucose-Capillary: 127 mg/dL — ABNORMAL HIGH (ref 70–99)
Glucose-Capillary: 130 mg/dL — ABNORMAL HIGH (ref 70–99)
Glucose-Capillary: 135 mg/dL — ABNORMAL HIGH (ref 70–99)
Glucose-Capillary: 139 mg/dL — ABNORMAL HIGH (ref 70–99)

## 2022-11-30 LAB — CULTURE, RESPIRATORY W GRAM STAIN

## 2022-11-30 LAB — TRIGLYCERIDES: Triglycerides: 206 mg/dL — ABNORMAL HIGH (ref <150)

## 2022-11-30 LAB — PHOSPHORUS: Phosphorus: 3.2 mg/dL (ref 2.5–4.6)

## 2022-11-30 LAB — MAGNESIUM: Magnesium: 3.1 mg/dL — ABNORMAL HIGH (ref 1.7–2.4)

## 2022-11-30 SURGERY — EVACUATION HEMATOMA
Anesthesia: General

## 2022-11-30 MED ORDER — POLYETHYLENE GLYCOL 3350 17 G PO PACK
17.0000 g | PACK | Freq: Every day | ORAL | Status: DC
  Administered 2022-11-30 – 2022-12-02 (×3): 17 g via ORAL
  Filled 2022-11-30 (×3): qty 1

## 2022-11-30 MED ORDER — PNEUMOCOCCAL VAC POLYVALENT 25 MCG/0.5ML IJ INJ: 0.5000 mL | INJECTION | INTRAMUSCULAR | Status: DC | PRN

## 2022-11-30 MED ORDER — MIDAZOLAM HCL 2 MG/2ML IJ SOLN
INTRAMUSCULAR | Status: AC
  Filled 2022-11-30: qty 2

## 2022-11-30 MED ORDER — 0.9 % SODIUM CHLORIDE (POUR BTL) OPTIME
TOPICAL | Status: DC | PRN
  Administered 2022-11-30 (×2): 1000 mL

## 2022-11-30 MED ORDER — TRAVASOL 10 % IV SOLN
INTRAVENOUS | Status: AC
  Filled 2022-11-30: qty 1440

## 2022-11-30 MED ORDER — TRIPLE ANTIBIOTIC 3.5-400-5000 EX OINT
TOPICAL_OINTMENT | CUTANEOUS | Status: AC
  Filled 2022-11-30: qty 1

## 2022-11-30 MED ORDER — ROCURONIUM BROMIDE 100 MG/10ML IV SOLN
INTRAVENOUS | Status: DC | PRN
  Administered 2022-11-30 (×3): 50 mg via INTRAVENOUS

## 2022-11-30 MED ORDER — MIDAZOLAM HCL 5 MG/5ML IJ SOLN
INTRAMUSCULAR | Status: DC | PRN
  Administered 2022-11-30 (×2): 2 mg via INTRAVENOUS

## 2022-11-30 MED ORDER — LACTATED RINGERS IV SOLN: INTRAVENOUS | Status: DC | PRN

## 2022-11-30 SURGICAL SUPPLY — 25 items
BLADE SURG 15 STRL LF DISP TIS (BLADE) IMPLANT
BLADE SURG 15 STRL SS (BLADE) ×1
BNDG GAUZE ROLL STR 2.25X3YD (GAUZE/BANDAGES/DRESSINGS) IMPLANT
BNDG GZE SM 3X2.25 6 PLY (GAUZE/BANDAGES/DRESSINGS) ×2
CLOTH BEACON ORANGE TIMEOUT ST (SAFETY) IMPLANT
COVER LIGHT HANDLE STERIS (MISCELLANEOUS) IMPLANT
ELECT REM PT RETURN 9FT ADLT (ELECTROSURGICAL) ×1
ELECTRODE REM PT RTRN 9FT ADLT (ELECTROSURGICAL) IMPLANT
GAUZE 4X4 16PLY ~~LOC~~+RFID DBL (SPONGE) IMPLANT
GLOVE BIO SURGEON STRL SZ 6.5 (GLOVE) IMPLANT
GLOVE BIOGEL PI IND STRL 6.5 (GLOVE) IMPLANT
GLOVE BIOGEL PI IND STRL 7.0 (GLOVE) IMPLANT
GOWN STRL REUS W/TWL LRG LVL3 (GOWN DISPOSABLE) IMPLANT
KIT TURNOVER KIT A (KITS) IMPLANT
MANIFOLD NEPTUNE II (INSTRUMENTS) IMPLANT
NS IRRIG 1000ML POUR BTL (IV SOLUTION) IMPLANT
PACK MINOR (CUSTOM PROCEDURE TRAY) IMPLANT
PAD ABD 5X9 TENDERSORB (GAUZE/BANDAGES/DRESSINGS) IMPLANT
STAPLER VISISTAT (STAPLE) IMPLANT
SUT VIC AB 2-0 CT1 27 (SUTURE) ×3
SUT VIC AB 2-0 CT1 TAPERPNT 27 (SUTURE) IMPLANT
SUT VIC AB 3-0 SH 27 (SUTURE) ×3
SUT VIC AB 3-0 SH 27X BRD (SUTURE) IMPLANT
TAPE HYPAFIX 6X30 (GAUZE/BANDAGES/DRESSINGS) IMPLANT
TUBE CONNECTING 12X1/4 (SUCTIONS) IMPLANT

## 2022-11-30 NOTE — Anesthesia Postprocedure Evaluation (Signed)
Anesthesia Post Note  Patient: Kyle Burns  Procedure(s) Performed: EVACUATION HEMATOMA  Patient location during evaluation: SICU Anesthesia Type: General Level of consciousness: sedated Pain management: pain level controlled Vital Signs Assessment: post-procedure vital signs reviewed and stable Respiratory status: patient remains intubated per anesthesia plan Cardiovascular status: stable Postop Assessment: no apparent nausea or vomiting Anesthetic complications: no   No notable events documented.   Last Vitals:  Vitals:   11/30/22 1100 11/30/22 1147  BP: 113/74   Pulse: 78   Resp: (!) 26   Temp:  37.3 C  SpO2: 96%     Last Pain:  Vitals:   11/30/22 1147  TempSrc: Axillary  PainSc:                  Windell Norfolk

## 2022-11-30 NOTE — Progress Notes (Signed)
PHARMACY - TOTAL PARENTERAL NUTRITION CONSULT NOTE   Indication: Prolonged ileus  Patient Measurements: Height: 5\' 11"  (180.3 cm) Weight: 127.7 kg (281 lb 8.4 oz) IBW/kg (Calculated) : 75.3 TPN AdjBW (KG): 88.9 Body mass index is 39.27 kg/m. Usual Weight:   Assessment:   Glucose / Insulin: 107 - 140. 9 units/24 hours Electrolytes: K 3.6 > 4.3  Mag 3.2 Renal: WNL Hepatic: TG 206 Intake / Output; MIVF:  GI Imaging: GI Surgeries / Procedures:   Central access: PICC TPN start date: 12/15  Nutritional Goals: Goal TPN rate is 100 mL/hr (provides 156 g of protein and 2300 kcals per day)  Patient currently on propofol @ 11.3 mL/hr ==> ~298 kcal/day   RD Assessment: Estimated Needs Total Energy Estimated Needs: 2400 Total Protein Estimated Needs: 150-160 gr Total Fluid Estimated Needs: >2 liters daily  Current Nutrition:  NPO  Plan:  Continue TPN to 100 mL/hr at 1800- no lipids in TPN since patient is receiving ~298 kcal/day with propofol. Possible extubation soon so will monitor. Electrolytes in TPN: Na 104mEq/L, K 71mEq/L, Ca 75mEq/L, Mg 13mEq/L, and Phos 30mmol/L. Cl:Ac 1:1 Add standard MVI and trace elements to TPN Continue Resistant q6h SSI and adjust as needed  Monitor TPN labs on Mon/Thurs,   9m, PharmD Clinical Pharmacist 11/30/2022 8:29 AM

## 2022-11-30 NOTE — Op Note (Signed)
[facility] Operative Note  11/30/22  Preoperative Diagnosis: Postoperative abdominal wall hematoma   Postoperative Diagnosis: Same   Procedure(s) Performed: Evacuation of postoperative abdominal wall hematoma   Surgeon: Theophilus Kinds, DO    Assistants: Algis Greenhouse, MD   Anesthesia: General endotracheal   Anesthesiologist: Windell Norfolk, MD    Specimens: None   Estimated Blood Loss: Minimal, large hematoma of old blood evacuated   Blood Replacement: None    Complications: None   Wound Class: Clean   Operative Indications: Patient is a 56 year old male who was status post open umbilical hernia repair with mesh on 12/9.  He was initially progressing, but subsequently developed a postoperative ileus, nausea, vomiting requiring intubation and monitoring in ICU.  He also went into A-fib with RVR and was temporarily on full dose anticoagulation.  He subsequently developed a large postoperative hematoma at his repair site.  Given the risk for infection, decision was made to take the patient to the operating room to evacuate this hematoma.  All risks and benefits of performing this procedure were discussed with the patient's mother including pain, infection, bleeding, damage to the surrounding structures, and need for more procedures or surgery. The patient's mother voiced understanding of the procedure, all questions were sought and answered, and consent was obtained.  Findings: Large postoperative hematoma without signs of infection, overlying fascial closure dehisced but mesh intact with with peripheral stitches still in place   Procedure: The patient was taken to the operating room and placed supine.  Patient presented from ICU intubated; anesthesia was initiated.  Patient receiving IV Zosyn on the floor.  NG tube and condom catheter already in place.  Patient's previous skin glue was removed.  The abdomen was prepared and draped in the usual sterile  fashion.  Timeout was performed.  Patient's previous incision was opened using scalpel.  Old clotted blood was immediately encountered.  His umbilical tacking stitch was taken down, and large hematoma was evacuated.  There was no evidence of infection.  Cavity was then thoroughly irrigated with normal saline.  Upon inspection of the surgical area, mesh was noted to be in place with peripheral stitches still intact.  The overlying fascia had dehisced.  There was no evidence of recurrence of his previous umbilical hernia.  To provide protection for the mesh, the overlying fascia was closed with 2-0 Vicryl in an interrupted fashion.  The cavity was inspected, and there was no evidence of active bleeding.  The cavity was then additionally irrigated with normal saline.  Using 3-0 Vicryl, the skin was tacked to the abdominal wall to decrease the potential space overlying his hernia repair.  The umbilicus was also tacked down with 3-0 Vicryl.  The dermis was closed with 3-0 Vicryl subcuticular stitches.  The skin was closed with skin staples.  Bacitracin was applied to the incision site.  Pressure dressing was applied with Kerlix, ABD pads, and foam tape.  Dr. Henreitta Leber was assisting throughout the procedure and was present for the critical portions of the case, including hematoma evacuation and fascial closure overlying intact mesh.   Final inspection revealed acceptable hemostasis. All counts were correct at the end of the case. The patient was transported directly to ICU while intubated for continued medical care and weaning from ventilator.     Theophilus Kinds, DO  [facility] [address] [place], [place] [address] [phone] (office)

## 2022-11-30 NOTE — Progress Notes (Signed)
Pharmacy Antibiotic Note  Kyle Burns is a 56 y.o. male admitted on 11/22/2022 with aspiration pneumonia.  Pharmacy has been consulted for zosyn dosing.  Plan: Zosyn 3.375g IV q8h (4 hour infusion). F/u cxs and clinical progress F/U de-escalation to ceftriaxone  Height: 5\' 11"  (180.3 cm) Weight: 127.7 kg (281 lb 8.4 oz) IBW/kg (Calculated) : 75.3  Temp (24hrs), Avg:99.5 F (37.5 C), Min:98.2 F (36.8 C), Max:100.5 F (38.1 C)  Recent Labs  Lab 11/26/22 0342 11/26/22 0925 11/27/22 0432 11/28/22 0432 11/29/22 0418 11/30/22 0508  WBC  --  14.6* 13.2* 12.5* 12.6* 14.6*  CREATININE 2.66*  --  2.89* 1.73* 1.61* 1.16     Estimated Creatinine Clearance: 94.5 mL/min (by C-G formula based on SCr of 1.16 mg/dL).    No Known Allergies  Antimicrobials this admission: zosyn 12/14 >>   Microbiology results: 12/13 RespCx: Serratia Marsecens  Thank you for allowing pharmacy to be a part of this patient's care.  1/14, PharmD Clinical Pharmacist 11/30/2022 8:40 AM

## 2022-11-30 NOTE — Progress Notes (Signed)
PROGRESS NOTE    Kyle Burns  OFB:[id number] DOB: [birth year] DOA: 11/22/2022 PCP: Raliegh Ip, DO   Brief Narrative:    Kyle Burns  is a 56 y.o. male, with past medical history of hypertension, asthma, GERD, hyperlipidemia, O SA/OHS, BiPAP dependent with oxygen at nighttime. -Patient presents to ED secondary to complaints of abdominal pain, developed yesterday, started having generalized abdominal pain, nausea, after eating [place] food, he denies any vomiting, he does report dry heaving as well, he denies any fever, chills, no dysuria or polyuria, he denies any obstipation or constipation, no diarrhea, no bright red blood per rectum, no chest pain, orts pain has worsened overnight which prompted him to come to ED -Patient underwent open umbilical hernia repair with placement of mesh on 12/9.  He is noted to have worsening abdominal distention suspected to be related to ileus and now has worsening respiratory distress and hypoxemia requiring intubation on 12/13.  He also developed atrial fibrillation with RVR and has remained in sinus rhythm and therefore amiodarone infusion has been discontinued.  Ileus appears to be slowly improving with increase in bowel movements and less NG tube output noted.  TPN initiated 12/15.  Continues to have persistent ileus in the setting of large umbilical hematoma noted on CT abdomen 12/16.  Assessment & Plan:   Principal Problem:   Incarcerated umbilical hernia Active Problems:   Hyperlipidemia   Obstructive sleep apnea   Gastroesophageal reflux disease   BMI 40.0-44.9, adult (HCC)   Mild intermittent asthma   Nodule of right lung   Acute respiratory failure with hypoxia (HCC)   Atrial fibrillation with rapid ventricular response (HCC)   Small bowel obstruction (HCC)   Septic shock (HCC)   AKI (acute kidney injury) (HCC)  Assessment and Plan:   Acute hypoxemic respiratory failure secondary to worsening abdominal  distention -Underlying OSA/OHS with known left hemidiaphragm paralysis -Intubated 12/13 -Appreciate ongoing PCCM evaluation, holding off on extubation until bowel distention improves -Plan to continue intubation through today with general surgery planning for abdominal washout given persistent hematoma   Septic shock with fever-resolved -Hypotension related to sedation and worsening abdominal distention as well as concern for aspiration pneumonia -Levophed has been weaned off -Continue IV Zosyn for possible aspiration coverage, will not de-escalate at this time with plans for surgery today -Chest x-ray appears to look okay 12/16, blood cultures ordered -Continue to monitor a.m. labs as well as lactic acid -CT abdomen and pelvis 12/16 with large hematoma and ongoing ileus, general surgery planning for intra-abdominal washout   Paroxysmal atrial fibrillation/RVR-now in sinus rhythm -Now in sinus rhythm, related to respiratory condition -2D echocardiogram with normal LVEF and grade 1 diastolic dysfunction -Amiodarone discontinued -Appreciate cardiology evaluation -No need for anticoagulation at this point   AKI-resolved -Continue IV fluid with TPN ongoing -Monitor urine output -Avoid nephrotoxic agents -A.m. labs   Incarcerated umbilical hernia/SBO s/p open umbilical hernia repair with mesh 12/9 now with ileus -Appears to slowly be improving -Continue n.p.o. with NG tube to LIS -Possible aspiration with recurrent vomiting and worsening abdominal distention -May require repeat imaging especially with potential worsening hematoma -Potassium goal of 4.0 and magnesium 2.0 -TPN to be initiated 12/15 per general surgery recommendations -CT abdomen pelvis with large hematoma with plans for washout today   History of hypertension -Currently hypotensive and on pressors, holding home medications   Obstructive sleep apnea/obesity hypoventilation syndrome -Currently intubated -Plan to  extubate to BiPAP   Dyslipidemia -Statin on discharge  GERD -PPI   History of asthma -Appears to be mild and intermittent -Continue as needed bronchodilator management. -Continue the use of Singulair. -No wheezing on exam.   Class III obesity -Body mass index is 39.46 kg/m. -Low-calorie diet, portion control and increase physical activity discussed with patient.   Incidental 4 mm right lung nodule -Outpatient follow-up with pulmonary service recommended.      DVT prophylaxis:Lovenox Code Status: Full Family Communication: None at bedside; Mother called by [facility] 12/16 Disposition Plan:  Status is: Inpatient Remains inpatient appropriate because: Need for IV medications   Consultants:  Gen Surg Cardiology Pulm   Procedures:  Open umbilical hernia repair with mesh Intubation 12/13   Antimicrobials:  Anti-infectives (From admission, onward)    Start     Dose/Rate Route Frequency Ordered Stop   11/27/22 1600  piperacillin-tazobactam (ZOSYN) IVPB 3.375 g        3.375 g 12.5 mL/hr over 240 Minutes Intravenous Every 8 hours 11/27/22 1525     11/22/22 1400  ceFAZolin (ANCEF) IVPB 3g/100 mL premix        3 g 200 mL/hr over 30 Minutes Intravenous  Once 11/22/22 1202 11/22/22 1245   11/22/22 1208  ceFAZolin (ANCEF) 3-0.9 GM/100ML-% IVPB       Note to Pharmacy: Lear Ng S: cabinet override      11/22/22 1208 11/22/22 1310   11/22/22 1200  ceFAZolin (ANCEF) IVPB 2g/100 mL premix  Status:  Discontinued        2 g 200 mL/hr over 30 Minutes Intravenous Every 8 hours 11/22/22 1149 11/22/22 1202       Subjective: Patient seen and evaluated today with no acute overnight events noted.  Abdomen is still distended, but he did have a bowel movement.  He is more awake and alert on CPAP support on the ventilator.  Objective: Vitals:   11/30/22 0623 11/30/22 0758 11/30/22 0800 11/30/22 0928  BP: 119/69     Pulse: 84     Resp: (!) 26     Temp:   99.6 F (37.6 C)   TempSrc:    Axillary   SpO2: 97% 99%    Weight:      Height:    5\' 11"  (1.803 m)    Intake/Output Summary (Last 24 hours) at 11/30/2022 1101 Last data filed at 11/30/2022 [id number] Gross per 24 hour  Intake 2279.02 ml  Output 2400 ml  Net -120.98 ml   Filed Weights   11/28/22 0530 11/28/22 1114 11/30/22 0610  Weight: 127.3 kg 127.3 kg 127.7 kg    Examination:  General exam: Appears calm and comfortable  Respiratory system: Clear to auscultation. Respiratory effort normal.  Intubated FiO2 35% Cardiovascular system: S1 & S2 heard, RRR.  Gastrointestinal system: Abdomen is distended with noted paraumbilical hematoma Central nervous system: Arousable on the ventilator Extremities: No edema Skin: No significant lesions noted Psychiatry: Cannot be assessed    Data Reviewed: I have personally reviewed following labs and imaging studies  CBC: Recent Labs  Lab 11/26/22 0925 11/27/22 0432 11/28/22 0432 11/29/22 0418 11/30/22 0508  WBC 14.6* 13.2* 12.5* 12.6* 14.6*  NEUTROABS 10.3*  --   --   --   --   HGB 14.8 12.8* 10.7* 11.3* 10.5*  HCT 44.0 38.0* 32.5* 34.6* 32.5*  MCV 86.3 85.4 87.1 88.7 89.5  PLT 409* 366 326 310 [id number]   Basic Metabolic Panel: Recent Labs  Lab 11/26/22 0342 11/27/22 0432 11/28/22 0432 11/29/22 0418 11/30/22 0508  NA 137 130*  135 137 138  K 3.0* 3.1* 3.2* 3.6 4.3  CL 94* 93* 100 103 107  CO2 28 26 27 26 23   GLUCOSE 167* 140* 125* 111* 140*  BUN 64* 78* 57* 53* 39*  CREATININE 2.66* 2.89* 1.73* 1.61* 1.16  CALCIUM 8.9 8.1* 7.5* 7.9* 8.5*  MG 3.7* 3.8* 3.6* 3.8* 3.1*  PHOS  --   --  2.8 3.6 3.2   GFR: Estimated Creatinine Clearance: 94.5 mL/min (by C-G formula based on SCr of 1.16 mg/dL). Liver Function Tests: Recent Labs  Lab 11/29/22 0418 11/30/22 0508  AST 60* 95*  ALT 58* 105*  ALKPHOS 76 89  BILITOT 0.6 0.4  PROT 6.2* 6.7  ALBUMIN 2.7* 2.9*   No results for input(s): "LIPASE", "AMYLASE" in the last 168 hours. No results for input(s):  "AMMONIA" in the last 168 hours. Coagulation Profile: No results for input(s): "INR", "PROTIME" in the last 168 hours. Cardiac Enzymes: No results for input(s): "CKTOTAL", "CKMB", "CKMBINDEX", "TROPONINI" in the last 168 hours. BNP (last 3 results) No results for input(s): "PROBNP" in the last 8760 hours. HbA1C: No results for input(s): "HGBA1C" in the last 72 hours. CBG: Recent Labs  Lab 11/29/22 1629 11/29/22 2040 11/30/22 0023 11/30/22 0424 11/30/22 0731  GLUCAP 115* 130* 125* 127* 139*   Lipid Profile: Recent Labs    11/29/22 0418 11/30/22 0508  TRIG 288* 206*   Thyroid Function Tests: No results for input(s): "TSH", "T4TOTAL", "FREET4", "T3FREE", "THYROIDAB" in the last 72 hours. Anemia Panel: No results for input(s): "VITAMINB12", "FOLATE", "FERRITIN", "TIBC", "IRON", "RETICCTPCT" in the last 72 hours. Sepsis Labs: No results for input(s): "PROCALCITON", "LATICACIDVEN" in the last 168 hours.  Recent Results (from the past 240 hour(s))  MRSA Next Gen by PCR, Nasal     Status: None   Collection Time: 11/22/22  5:25 PM   Specimen: Nasal Mucosa; Nasal Swab  Result Value Ref Range Status   MRSA by PCR Next Gen NOT DETECTED NOT DETECTED Final    Comment: (NOTE) The GeneXpert MRSA Assay (FDA approved for NASAL specimens only), is one component of a comprehensive MRSA colonization surveillance program. It is not intended to diagnose MRSA infection nor to guide or monitor treatment for MRSA infections. Test performance is not FDA approved in patients less than 85 years old. Performed at [facility], [address] [place], [place] [address]   Culture, Respiratory w Gram Stain     Status: None (Preliminary result)   Collection Time: 11/26/22  3:58 PM   Specimen: Tracheal Aspirate; Respiratory  Result Value Ref Range Status   Specimen Description   Final    TRACHEAL ASPIRATE Performed at [facility], [address] [place], [place] [address]    Special Requests    Final    NONE Performed at [facility], [address], [place], [place] [address]    Gram Stain   Final    RARE WBC PRESENT, PREDOMINANTLY PMN MODERATE GRAM NEGATIVE RODS    Culture   Final    ABUNDANT SERRATIA MARCESCENS FEW [place] TO FOLLOW Performed at [facility], [place] [address] [place], [place] [address]    Report Status PENDING  Incomplete   Organism ID, Bacteria SERRATIA MARCESCENS  Final      Susceptibility   Serratia marcescens - MIC*    CEFAZOLIN >=64 RESISTANT Resistant     CEFEPIME <=0.12 SENSITIVE Sensitive     CEFTAZIDIME <=1 SENSITIVE Sensitive     CEFTRIAXONE <=0.25 SENSITIVE  Sensitive     CIPROFLOXACIN <=0.25 SENSITIVE Sensitive     GENTAMICIN <=1 SENSITIVE Sensitive     TRIMETH/SULFA <=20 SENSITIVE Sensitive     * ABUNDANT SERRATIA MARCESCENS  Culture, blood (Routine X 2) w Reflex to ID Panel     Status: None (Preliminary result)   Collection Time: 11/29/22  8:29 AM   Specimen: BLOOD LEFT HAND  Result Value Ref Range Status   Specimen Description   Final    BLOOD LEFT HAND BOTTLES DRAWN AEROBIC AND ANAEROBIC   Special Requests   Final    Blood Culture results may not be optimal due to an excessive volume of blood received in culture bottles   Culture   Final    NO GROWTH 1 DAY Performed at [facility], [address] [place], [place] [address]    Report Status PENDING  Incomplete  Culture, blood (Routine X 2) w Reflex to ID Panel     Status: None (Preliminary result)   Collection Time: 11/29/22  8:29 AM   Specimen: BLOOD LEFT WRIST  Result Value Ref Range Status   Specimen Description BLOOD LEFT WRIST AEROBIC BOTTLE ONLY  Final   Special Requests Blood Culture adequate volume  Final   Culture   Final    NO GROWTH 1 DAY Performed at [facility], [address], [place], [place] [address]    Report Status PENDING  Incomplete         Radiology Studies: CT ABDOMEN PELVIS WO CONTRAST  Result Date:  11/29/2022 CLINICAL DATA:  56 year old male with abdominal pain following umbilical hernia repair on 11/22/2022. Hematoma overlying the umbilical hernia repair. EXAM: CT ABDOMEN AND PELVIS WITHOUT CONTRAST TECHNIQUE: Multidetector CT imaging of the abdomen and pelvis was performed following the standard protocol without IV contrast. RADIATION DOSE REDUCTION: This exam was performed according to the departmental dose-optimization program which includes automated exposure control, adjustment of the mA and/or kV according to patient size and/or use of iterative reconstruction technique. COMPARISON:  11/22/2022 CT FINDINGS: Please note that parenchymal and vascular abnormalities may be missed as intravenous contrast was not administered. Lower chest: RIGHT LOWER lobe consolidation/atelectasis noted. Hepatobiliary: Hepatic steatosis identified without focal hepatic abnormality. The gallbladder is unremarkable. There is no evidence of intrahepatic or extrahepatic biliary dilatation. Pancreas: Unremarkable Spleen: Unremarkable Adrenals/Urinary Tract: The kidneys and adrenal glands are unremarkable. Foley catheter is present within the bladder. Stomach/Bowel: An NG tube is present within the mid stomach. There are mildly distended proximal and mid small bowel loops with nondistended distal small bowel loops. No discrete transition point is identified. The appendix is unremarkable. No definite bowel wall thickening identified. Vascular/Lymphatic: No significant vascular findings are present. No enlarged abdominal or pelvic lymph nodes. Reproductive: Unremarkable Other: A 9.1 x 6.6 x 11.5 cm high density structure in the periumbilical subcutaneous tissues is compatible with a hematoma. Small bowel loops are noted extending to the undersurface of the abdominal wall in this area, but it is difficult to determine if there is recurrence of hernia in this area. There is no evidence of discrete transition change of bowel in this  region however. No evidence of ascites, pneumoperitoneum or other focal collection. Musculoskeletal: Bilateral inguinal hernia repair noted with moderate RIGHT inguinal hernia containing fat again noted. IMPRESSION: 1. 9.1 x 6.6 x 0000000 cm paraumbilical subcutaneous hematoma. Small bowel loops are noted extending to the undersurface of the abdominal wall in this area, but it is difficult to determine if there is recurrence of hernia  in this area. No evidence of discrete transition change of bowel in this region however. Consider follow-up CT with enteric contrast, as clinically indicated. 2. Mildly distended proximal and mid small bowel loops with nondistended distal small bowel loops. No discrete transition point identified. Favor ileus over low-grade partial small bowel obstruction. 3. RIGHT LOWER lobe consolidation/atelectasis. 4. Hepatic steatosis. 5. Bilateral inguinal hernia repair with moderate RIGHT inguinal hernia containing fat. Electronically Signed   By: Margarette Canada M.D.   On: 11/29/2022 13:20   DG CHEST PORT 1 VIEW  Result Date: 11/29/2022 CLINICAL DATA:  Respiratory failure EXAM: PORTABLE CHEST 1 VIEW COMPARISON:  11/28/2022 and prior studies FINDINGS: This is a low volume study. Endotracheal tube with tip 2.5 cm above the carina, SUPERIOR cavoatrial junction and enteric tube with tip difficult to visualize. Significant decrease in LEFT LOWER lung opacities and probable small LEFT effusion noted. There is no evidence of pneumothorax. No acute bony abnormalities are identified. IMPRESSION: Significant decrease in LEFT LOWER lung opacities and probable small LEFT effusion. Electronically Signed   By: Margarette Canada M.D.   On: 11/29/2022 09:12        Scheduled Meds:  bisacodyl  10 mg Rectal BID   Chlorhexidine Gluconate Cloth  6 each Topical Daily   docusate  100 mg Per Tube BID   enoxaparin (LOVENOX) injection  60 mg Subcutaneous Q24H   fluticasone  1 spray Each Nare Daily   insulin aspart   0-20 Units Subcutaneous Q6H   ipratropium  0.5 mg Nebulization Q6H   levalbuterol  0.63 mg Nebulization Q6H   montelukast  10 mg Per Tube QHS    morphine injection  2 mg Intravenous Once   mouth rinse  15 mL Mouth Rinse Q2H   pantoprazole (PROTONIX) IV  40 mg Intravenous Q12H   sodium chloride flush  10-40 mL Intracatheter Q12H   Continuous Infusions:  sodium chloride     fentaNYL infusion INTRAVENOUS Stopped (11/28/22 1058)   norepinephrine (LEVOPHED) Adult infusion Stopped (11/28/22 0847)   piperacillin-tazobactam (ZOSYN)  IV 3.375 g (11/30/22 0525)   propofol (DIPRIVAN) infusion 15 mcg/kg/min (11/30/22 0521)   TPN ADULT (ION) 100 mL/hr at 11/29/22 1836   TPN ADULT (ION)       LOS: 8 days    Time spent: 35 minutes    Caryl Fate Darleen Crocker, DO [facility]  If 7PM-7AM, please contact night-coverage [email or web address] 11/30/2022, 11:01 AM

## 2022-11-30 NOTE — Transfer of Care (Signed)
Immediate Anesthesia Transfer of Care Note  Patient: Kyle Burns  Procedure(s) Performed: EVACUATION HEMATOMA  Patient Location: ICU  Anesthesia Type:General  Level of Consciousness: Patient remains intubated per anesthesia plan  Airway & Oxygen Therapy: Patient remains intubated per anesthesia plan and Patient placed on Ventilator (see vital sign flow sheet for setting)  Post-op Assessment: Report given to RN and Post -op Vital signs reviewed and stable  Post vital signs: Reviewed and stable  Last Vitals:  Vitals Value Taken Time  BP 137/65   Temp 98   Pulse 91   Resp VENT   SpO2 99     Last Pain:  Vitals:   11/30/22 1147  TempSrc: Axillary  PainSc:       Patients Stated Pain Goal: 0 (11/24/22 1322)  Complications: No notable events documented.

## 2022-11-30 NOTE — Progress Notes (Signed)
[facility]  Spoke with the patient's mother in patient's room.  I explained that he tolerated the procedure without difficulty.  We encountered a large hematoma which we evacuated, but the mesh was intact and there was no evidence of recurrence of his hernia.  I further explained that we tacked down his skin to his abdominal wall to decrease the potential space and risk of hematoma/seroma formation.  A pressure dressing was applied, which will remain in place for the next 2 days.  The medical team will continue to wean him from the ventilator with hopes for extubation in the next 24 to 48 hours.  I further explained that NG tube will remain in place even after extubation to verify that his ileus has completely resolved prior to removal.  All questions were answered to her expressed satisfaction.  Plan: -Transfer back to the ICU intubated -Maintain NG to LIS -Maintain pressure dressing for the next 24 to 48 hours -Continue IV antibiotics -May wean from ventilator as able -Patient will require NG tube to remain in place even after extubation -MiraLAX ordered -Hospitalist team updated  Theophilus Kinds, DO [facility] [address] [place] [place], [place] [address] [phone] (office)

## 2022-11-30 NOTE — Anesthesia Preprocedure Evaluation (Signed)
Anesthesia Evaluation  Patient identified by MRN, date of birth, ID band Patient unresponsive    Reviewed: Allergy & Precautions, H&P , NPO status , Patient's Chart, lab work & pertinent test results, reviewed documented beta blocker date and time , Unable to perform ROS - Chart review only  Airway Mallampati: Intubated  TM Distance: >3 FB Neck ROM: full    Dental no notable dental hx.    Pulmonary shortness of breath, asthma , sleep apnea , pneumonia, unresolved, COPD   breath sounds clear to auscultation   + intubated    Cardiovascular Exercise Tolerance: Good hypertension, negative cardio ROS  Rhythm:regular Rate:Normal     Neuro/Psych   Anxiety      Neuromuscular disease  negative psych ROS   GI/Hepatic negative GI ROS, Neg liver ROS,,,  Endo/Other    Morbid obesity  Renal/GU ARFRenal disease  negative genitourinary   Musculoskeletal   Abdominal   Peds  Hematology negative hematology ROS (+)   Anesthesia Other Findings   Reproductive/Obstetrics negative OB ROS                             Anesthesia Physical Anesthesia Plan  ASA: 4 and emergent  Anesthesia Plan: General and General ETT   Post-op Pain Management:    Induction: Inhalational  PONV Risk Score and Plan: Ondansetron  Airway Management Planned: Oral ETT  Additional Equipment:   Intra-op Plan:   Post-operative Plan: Post-operative intubation/ventilation  Informed Consent: I have reviewed the patients History and Physical, chart, labs and discussed the procedure including the risks, benefits and alternatives for the proposed anesthesia with the patient or authorized representative who has indicated his/her understanding and acceptance.     Dental Advisory Given  Plan Discussed with: CRNA  Anesthesia Plan Comments:        Anesthesia Quick Evaluation

## 2022-11-30 NOTE — Progress Notes (Signed)
[facility] Progress Note  8 Days Post-Op  Subjective: Patient seen and examined.  He remains intubated in the ICU on minimal vent setting and on minimal sedation.  He has had 1700 cc of gastric contents out of his NG tube in the last 24 hours.  He had a bowel movement documented overnight.  Objective: Vital signs in last 24 hours: Temp:  [98.2 F (36.8 C)-100.5 F (38.1 C)] 99.6 F (37.6 C) (12/17 0800) Pulse Rate:  [66-84] 84 (12/17 0623) Resp:  [18-32] 26 (12/17 0623) BP: (96-130)/(53-80) 119/69 (12/17 0623) SpO2:  [95 %-100 %] 99 % (12/17 0758) FiO2 (%):  [35 %-40 %] 40 % (12/17 0928) Weight:  [127.7 kg] 127.7 kg (12/17 0610) Last BM Date : 11/29/22  Intake/Output from previous day: 12/16 0701 - 12/17 0700 In: 3718.7 [I.V.:3331.5; IV Piggyback:387.2] Out: 2800 [Urine:1100; Emesis/NG output:1700] Intake/Output this shift: No intake/output data recorded.  General appearance: alert and intubated in icu Nose: NG tube in place with gastric contents in cannister  GI: Abdomen soft, moderate distention, minimal TTP; no rigidity or guarding; distention and ecchymosis of the umbilical surgical site  Lab Results:  Recent Labs    11/29/22 0418 11/30/22 0508  WBC 12.6* 14.6*  HGB 11.3* 10.5*  HCT 34.6* 32.5*  PLT 310 331   BMET Recent Labs    11/29/22 0418 11/30/22 0508  NA 137 138  K 3.6 4.3  CL 103 107  CO2 26 23  GLUCOSE 111* 140*  BUN 53* 39*  CREATININE 1.61* 1.16  CALCIUM 7.9* 8.5*   PT/INR No results for input(s): "LABPROT", "INR" in the last 72 hours.  Studies/Results: CT ABDOMEN PELVIS WO CONTRAST  Result Date: 11/29/2022 CLINICAL DATA:  56 year old male with abdominal pain following umbilical hernia repair on 11/22/2022. Hematoma overlying the umbilical hernia repair. EXAM: CT ABDOMEN AND PELVIS WITHOUT CONTRAST TECHNIQUE: Multidetector CT imaging of the abdomen and pelvis was performed following the standard protocol without IV  contrast. RADIATION DOSE REDUCTION: This exam was performed according to the departmental dose-optimization program which includes automated exposure control, adjustment of the mA and/or kV according to patient size and/or use of iterative reconstruction technique. COMPARISON:  11/22/2022 CT FINDINGS: Please note that parenchymal and vascular abnormalities may be missed as intravenous contrast was not administered. Lower chest: RIGHT LOWER lobe consolidation/atelectasis noted. Hepatobiliary: Hepatic steatosis identified without focal hepatic abnormality. The gallbladder is unremarkable. There is no evidence of intrahepatic or extrahepatic biliary dilatation. Pancreas: Unremarkable Spleen: Unremarkable Adrenals/Urinary Tract: The kidneys and adrenal glands are unremarkable. Foley catheter is present within the bladder. Stomach/Bowel: An NG tube is present within the mid stomach. There are mildly distended proximal and mid small bowel loops with nondistended distal small bowel loops. No discrete transition point is identified. The appendix is unremarkable. No definite bowel wall thickening identified. Vascular/Lymphatic: No significant vascular findings are present. No enlarged abdominal or pelvic lymph nodes. Reproductive: Unremarkable Other: A 9.1 x 6.6 x 11.5 cm high density structure in the periumbilical subcutaneous tissues is compatible with a hematoma. Small bowel loops are noted extending to the undersurface of the abdominal wall in this area, but it is difficult to determine if there is recurrence of hernia in this area. There is no evidence of discrete transition change of bowel in this region however. No evidence of ascites, pneumoperitoneum or other focal collection. Musculoskeletal: Bilateral inguinal hernia repair noted with moderate RIGHT inguinal hernia containing fat again noted. IMPRESSION: 1. 9.1 x 6.6 x 11.5 cm paraumbilical  subcutaneous hematoma. Small bowel loops are noted extending to the  undersurface of the abdominal wall in this area, but it is difficult to determine if there is recurrence of hernia in this area. No evidence of discrete transition change of bowel in this region however. Consider follow-up CT with enteric contrast, as clinically indicated. 2. Mildly distended proximal and mid small bowel loops with nondistended distal small bowel loops. No discrete transition point identified. Favor ileus over low-grade partial small bowel obstruction. 3. RIGHT LOWER lobe consolidation/atelectasis. 4. Hepatic steatosis. 5. Bilateral inguinal hernia repair with moderate RIGHT inguinal hernia containing fat. Electronically Signed   By: Margarette Canada M.D.   On: 11/29/2022 13:20   DG CHEST PORT 1 VIEW  Result Date: 11/29/2022 CLINICAL DATA:  Respiratory failure EXAM: PORTABLE CHEST 1 VIEW COMPARISON:  11/28/2022 and prior studies FINDINGS: This is a low volume study. Endotracheal tube with tip 2.5 cm above the carina, SUPERIOR cavoatrial junction and enteric tube with tip difficult to visualize. Significant decrease in LEFT LOWER lung opacities and probable small LEFT effusion noted. There is no evidence of pneumothorax. No acute bony abnormalities are identified. IMPRESSION: Significant decrease in LEFT LOWER lung opacities and probable small LEFT effusion. Electronically Signed   By: Margarette Canada M.D.   On: 11/29/2022 09:12    Anti-infectives: Anti-infectives (From admission, onward)    Start     Dose/Rate Route Frequency Ordered Stop   11/27/22 1600  piperacillin-tazobactam (ZOSYN) IVPB 3.375 g        3.375 g 12.5 mL/hr over 240 Minutes Intravenous Every 8 hours 11/27/22 1525     11/22/22 1400  ceFAZolin (ANCEF) IVPB 3g/100 mL premix        3 g 200 mL/hr over 30 Minutes Intravenous  Once 11/22/22 1202 11/22/22 1245   11/22/22 1208  ceFAZolin (ANCEF) 3-0.9 GM/100ML-% IVPB       Note to Pharmacy: Lear Ng S: cabinet override      11/22/22 1208 11/22/22 1310   11/22/22 1200   ceFAZolin (ANCEF) IVPB 2g/100 mL premix  Status:  Discontinued        2 g 200 mL/hr over 30 Minutes Intravenous Every 8 hours 11/22/22 1149 11/22/22 1202       Assessment/Plan:  Patient is a 56 year old male who is s/p incarcerated umbilical hernia repair with mesh 12/09. Had post operative ileus and required NG and then required intubation for low lung volumes and possible PNA.    -CT abdomen and pelvis on 12/16 demonstrating hematoma with likely ileus -WBC increased today to 14.6 -Discussed imaging findings with mother and have made the decision to take the patient to the OR today for evacuation of hematoma -Risks and benefits of hematoma evacuation discussed, including but not limited to bleeding, infection, injury to surrounding structures, and need for additional procedure.  After careful consideration, the patient's mother, Danella Maiers, has decided to proceed with surgery -Maintain NG to LIS -Continue TPN -Zosyn for pneumonia per primary team -Continue to wean ventilator as able -Appreciate hospitalist recommendations   LOS: 8 days    [place] 11/30/2022

## 2022-12-01 ENCOUNTER — Inpatient Hospital Stay (HOSPITAL_COMMUNITY): Payer: No Typology Code available for payment source

## 2022-12-01 DIAGNOSIS — J9601 Acute respiratory failure with hypoxia: Secondary | ICD-10-CM | POA: Diagnosis not present

## 2022-12-01 DIAGNOSIS — J4520 Mild intermittent asthma, uncomplicated: Secondary | ICD-10-CM | POA: Diagnosis not present

## 2022-12-01 DIAGNOSIS — K420 Umbilical hernia with obstruction, without gangrene: Secondary | ICD-10-CM | POA: Diagnosis not present

## 2022-12-01 LAB — CBC
HCT: 33.8 % — ABNORMAL LOW (ref 39.0–52.0)
Hemoglobin: 10.8 g/dL — ABNORMAL LOW (ref 13.0–17.0)
MCH: 29 pg (ref 26.0–34.0)
MCHC: 32 g/dL (ref 30.0–36.0)
MCV: 90.6 fL (ref 80.0–100.0)
Platelets: 356 10*3/uL (ref 150–400)
RBC: 3.73 MIL/uL — ABNORMAL LOW (ref 4.22–5.81)
RDW: 13.6 % (ref 11.5–15.5)
WBC: 17.7 10*3/uL — ABNORMAL HIGH (ref 4.0–10.5)
nRBC: 0.1 % (ref 0.0–0.2)

## 2022-12-01 LAB — GLUCOSE, CAPILLARY
Glucose-Capillary: 112 mg/dL — ABNORMAL HIGH (ref 70–99)
Glucose-Capillary: 116 mg/dL — ABNORMAL HIGH (ref 70–99)
Glucose-Capillary: 121 mg/dL — ABNORMAL HIGH (ref 70–99)
Glucose-Capillary: 123 mg/dL — ABNORMAL HIGH (ref 70–99)
Glucose-Capillary: 125 mg/dL — ABNORMAL HIGH (ref 70–99)
Glucose-Capillary: 126 mg/dL — ABNORMAL HIGH (ref 70–99)
Glucose-Capillary: 127 mg/dL — ABNORMAL HIGH (ref 70–99)
Glucose-Capillary: 127 mg/dL — ABNORMAL HIGH (ref 70–99)
Glucose-Capillary: 134 mg/dL — ABNORMAL HIGH (ref 70–99)

## 2022-12-01 LAB — COMPREHENSIVE METABOLIC PANEL
ALT: 92 U/L — ABNORMAL HIGH (ref 0–44)
AST: 60 U/L — ABNORMAL HIGH (ref 15–41)
Albumin: 3 g/dL — ABNORMAL LOW (ref 3.5–5.0)
Alkaline Phosphatase: 100 U/L (ref 38–126)
Anion gap: 9 (ref 5–15)
BUN: 35 mg/dL — ABNORMAL HIGH (ref 6–20)
CO2: 22 mmol/L (ref 22–32)
Calcium: 8.7 mg/dL — ABNORMAL LOW (ref 8.9–10.3)
Chloride: 108 mmol/L (ref 98–111)
Creatinine, Ser: 1.26 mg/dL — ABNORMAL HIGH (ref 0.61–1.24)
GFR, Estimated: >60 mL/min (ref >60)
Glucose, Bld: 133 mg/dL — ABNORMAL HIGH (ref 70–99)
Potassium: 4.5 mmol/L (ref 3.5–5.1)
Sodium: 139 mmol/L (ref 135–145)
Total Bilirubin: 0.6 mg/dL (ref 0.3–1.2)
Total Protein: 7 g/dL (ref 6.5–8.1)

## 2022-12-01 LAB — MAGNESIUM: Magnesium: 2.6 mg/dL — ABNORMAL HIGH (ref 1.7–2.4)

## 2022-12-01 LAB — PHOSPHORUS: Phosphorus: 3.3 mg/dL (ref 2.5–4.6)

## 2022-12-01 LAB — TRIGLYCERIDES: Triglycerides: 172 mg/dL — ABNORMAL HIGH (ref <150)

## 2022-12-01 MED ORDER — METOCLOPRAMIDE HCL 5 MG/ML IJ SOLN
10.0000 mg | Freq: Four times a day (QID) | INTRAMUSCULAR | Status: DC
  Administered 2022-12-01 – 2022-12-05 (×16): 10 mg via INTRAVENOUS
  Filled 2022-12-01 (×16): qty 2

## 2022-12-01 MED ORDER — ACETAMINOPHEN 650 MG RE SUPP
650.0000 mg | RECTAL | Status: DC | PRN
  Administered 2022-12-01 – 2022-12-02 (×2): 650 mg via RECTAL
  Filled 2022-12-01 (×2): qty 1

## 2022-12-01 MED ORDER — BUDESONIDE 0.5 MG/2ML IN SUSP
0.5000 mg | Freq: Two times a day (BID) | RESPIRATORY_TRACT | Status: DC
  Administered 2022-12-01 – 2022-12-04 (×7): 0.5 mg via RESPIRATORY_TRACT
  Filled 2022-12-01 (×7): qty 2

## 2022-12-01 MED ORDER — TRAVASOL 10 % IV SOLN
INTRAVENOUS | Status: AC
  Filled 2022-12-01: qty 1440

## 2022-12-01 NOTE — Plan of Care (Signed)

## 2022-12-01 NOTE — Progress Notes (Signed)
RT called due to patient vent alarming increased RR. Upon entering room RR was 37. RT placed patient back on full support vent settings. RN aware and is giving patient something to make him more comfortable. Will monitor as needed.

## 2022-12-01 NOTE — Consult Note (Signed)
[facility] Consult Note  Kyle Burns December 25, 1965  [id number].    Requesting MD: Heath Lark Chief Complaint/Reason for Consult: recent surgery for umbilical hernia with SBO  HPI:  Kyle Burns is a 56 y.o. male PMH HTN, HLD, GERD, hx asthma, OSA/OHS with known left hemidiaphragm paralysis, and obesity BMI 38 who presented to [facility] 11/22/22 with an incarcerated umbilical hernia causing SBO. He underwent open umbilical hernia repair with mesh on 11/22/22 by Dr. Okey Dupre. Patient was noted to be in A-fib with RVR on POD#2, so he was started on full dose Lovenox and rate controlling medications. On POD#3 he developed an ileus requiring NG tube placement. The following day he was intubated for respiratory distress. CT scan was repeated 11/29/22 and revealed a 9.1 x 6.6 x 0000000 cm paraumbilical subcutaneous hematoma without obvious recurrence of his hernia. Given this hematoma and worsening leukocytosis he was taken back to the operating room 11/30/22 for evacuation of postoperative abdominal wall hematoma; no evidence of infection at the take back and mesh was in place without hernia recurrence; pressure dressing was placed to reduce risk of recurrence.  Patient remains on the vent and was transferred to [facility] for management by the critical care team. He is on Reglan, Miralax, and dulcolax suppositories for his ileus with NG tube in place. He is on TPN for nutritional support. General surgery asked to see for assistance with postoperative management.    Family History  Problem Relation Age of Onset   Asthma Father    Cancer Father        stomach   Arthritis Father    Asthma Maternal Grandfather    Diabetes Maternal Grandfather    Stroke Maternal Grandfather    Asthma Paternal Grandfather    Crohn's disease Mother    Arthritis Mother        back pain    Diabetes Mother    Hypertension Sister    Multiple sclerosis Brother    Hypertension  Maternal Grandmother     Past Medical History:  Diagnosis Date   Back pain    GERD (gastroesophageal reflux disease)    History of asthma    HTN (hypertension)    Hyperlipidemia    OSA (obstructive sleep apnea)    BiPAP    Past Surgical History:  Procedure Laterality Date   bilateral hernia repair     total of 3    CYST REMOVAL NECK     age - [id number]   UMBILICAL HERNIA REPAIR N/A 11/22/2022   Procedure: HERNIA REPAIR UMBILICAL ADULT,  WITH MESH;  Surgeon: Rusty Aus, DO;  Location: [facility];  Service: General;  Laterality: N/A;    Social History:  reports that he has never smoked. He quit smokeless tobacco use about 3 years ago.  His smokeless tobacco use included chew. He reports that he does not drink alcohol and does not use drugs.  Allergies: No Known Allergies  Medications Prior to Admission  Medication Sig Dispense Refill   albuterol (VENTOLIN HFA) 108 (90 Base) MCG/ACT inhaler USE 2 PUFFS EVERY 6 HOURS AS NEEDED FOR WHEEZING (1 for home, 1 for work) (Patient taking differently: Inhale 2 puffs into the lungs every 6 (six) hours as needed for wheezing.) 36 g 4   aspirin 81 MG EC tablet Take 162 mg by mouth in the morning.     atorvastatin (LIPITOR) 10 MG tablet Take 1 tablet (10 mg total) by mouth daily. (Patient taking  differently: Take 10 mg by mouth at bedtime.) 90 tablet 3   budesonide-formoterol (SYMBICORT) 80-4.5 MCG/ACT inhaler Inhale 2 puffs into the lungs 2 (two) times daily. (Patient taking differently: Inhale 2 puffs into the lungs daily as needed (wheezing).) 30.6 g 4   esomeprazole (NEXIUM) 40 MG capsule Take 1 capsule (40 mg total) by mouth daily. (Patient taking differently: Take 40 mg by mouth in the morning.) 90 capsule 3   famotidine (PEPCID) 20 MG tablet One after supper (Patient taking differently: Take 20 mg by mouth every evening. After supper) 30 tablet 11   FLUoxetine (PROZAC) 20 MG capsule TAKE ONE (1) CAPSULE EACH DAY (Patient taking  differently: Take 20 mg by mouth at bedtime.) 90 capsule 0   fluticasone (FLONASE) 50 MCG/ACT nasal spray Place 2 sprays into both nostrils daily. (Patient taking differently: Place 2 sprays into both nostrils in the morning.) 16 g 6   furosemide (LASIX) 20 MG tablet TAKE ONE (1) TABLET EACH DAY (Patient taking differently: Take 20 mg by mouth in the morning.) 90 tablet 3   levocetirizine (XYZAL) 5 MG tablet Take 1 tablet (5 mg total) by mouth every evening. To replace Zyrtec (Patient taking differently: Take 5 mg by mouth at bedtime.) 90 tablet 0   montelukast (SINGULAIR) 10 MG tablet Take 1 tablet (10 mg total) by mouth at bedtime. 30 tablet 3   Multiple Vitamins-Minerals (ONE-A-DAY MENS 50+) TABS Take 1 tablet by mouth in the morning.     spironolactone (ALDACTONE) 25 MG tablet TAKE ONE (1) TABLET EACH DAY (Patient taking differently: Take 25 mg by mouth in the morning.) 90 tablet 3   benzonatate (TESSALON) 200 MG capsule Take 1 capsule (200 mg total) by mouth 3 (three) times daily as needed for cough. (Patient not taking: Reported on 11/23/2022) 30 capsule 1   brompheniramine-pseudoephedrine-DM 30-2-10 MG/5ML syrup Take 5 mLs by mouth 4 (four) times daily as needed. (Patient not taking: Reported on 11/23/2022) 120 mL 0   guaiFENesin (MUCINEX) 600 MG 12 hr tablet Take 1 tablet (600 mg total) by mouth 2 (two) times daily. (Patient not taking: Reported on 11/23/2022) 30 tablet 0   losartan-hydrochlorothiazide (HYZAAR) 100-25 MG tablet Take 1 tablet by mouth daily. (Patient taking differently: Take 1 tablet by mouth in the morning.) 90 tablet 3    Prior to Admission medications   Medication Sig Start Date End Date Taking? Authorizing Provider  albuterol (VENTOLIN HFA) 108 (90 Base) MCG/ACT inhaler USE 2 PUFFS EVERY 6 HOURS AS NEEDED FOR WHEEZING (1 for home, 1 for work) Patient taking differently: Inhale 2 puffs into the lungs every 6 (six) hours as needed for wheezing. 09/17/22  Yes Ivy Lynn,  NP  aspirin 81 MG EC tablet Take 162 mg by mouth in the morning.   Yes [provider]  atorvastatin (LIPITOR) 10 MG tablet Take 1 tablet (10 mg total) by mouth daily. Patient taking differently: Take 10 mg by mouth at bedtime. 11/04/21  Yes Gottschalk, Ashly M, DO  budesonide-formoterol (SYMBICORT) 80-4.5 MCG/ACT inhaler Inhale 2 puffs into the lungs 2 (two) times daily. Patient taking differently: Inhale 2 puffs into the lungs daily as needed (wheezing). 11/04/21  Yes Gottschalk, Leatrice Jewels M, DO  esomeprazole (NEXIUM) 40 MG capsule Take 1 capsule (40 mg total) by mouth daily. Patient taking differently: Take 40 mg by mouth in the morning. 11/04/21  Yes Gottschalk, Leatrice Jewels M, DO  famotidine (PEPCID) 20 MG tablet One after supper Patient taking differently: Take 20 mg  by mouth every evening. After supper 09/24/22  Yes [name], MD  FLUoxetine (PROZAC) 20 MG capsule TAKE ONE (1) CAPSULE EACH DAY Patient taking differently: Take 20 mg by mouth at bedtime. 10/06/22  Yes Gottschalk, Ashly M, DO  fluticasone (FLONASE) 50 MCG/ACT nasal spray Place 2 sprays into both nostrils daily. Patient taking differently: Place 2 sprays into both nostrils in the morning. 11/04/21  Yes Gottschalk, Ashly M, DO  furosemide (LASIX) 20 MG tablet TAKE ONE (1) TABLET EACH DAY Patient taking differently: Take 20 mg by mouth in the morning. 11/04/21  Yes Gottschalk, Kathie Rhodes M, DO  levocetirizine (XYZAL) 5 MG tablet Take 1 tablet (5 mg total) by mouth every evening. To replace Zyrtec Patient taking differently: Take 5 mg by mouth at bedtime. 10/06/22  Yes Gottschalk, Ashly M, DO  montelukast (SINGULAIR) 10 MG tablet Take 1 tablet (10 mg total) by mouth at bedtime. 09/12/22  Yes Rakes, Doralee Albino, FNP  Multiple Vitamins-Minerals (ONE-A-DAY MENS 50+) TABS Take 1 tablet by mouth in the morning.   Yes [provider]  spironolactone (ALDACTONE) 25 MG tablet TAKE ONE (1) TABLET EACH DAY Patient taking differently:  Take 25 mg by mouth in the morning. 11/04/21  Yes Gottschalk, Ashly M, DO  benzonatate (TESSALON) 200 MG capsule Take 1 capsule (200 mg total) by mouth 3 (three) times daily as needed for cough. Patient not taking: Reported on 11/23/2022 11/14/22   [name], MD  brompheniramine-pseudoephedrine-DM 30-2-10 MG/5ML syrup Take 5 mLs by mouth 4 (four) times daily as needed. Patient not taking: Reported on 11/23/2022 09/12/22   Daryll Drown, NP  guaiFENesin (MUCINEX) 600 MG 12 hr tablet Take 1 tablet (600 mg total) by mouth 2 (two) times daily. Patient not taking: Reported on 11/23/2022 09/17/22   Daryll Drown, NP  losartan-hydrochlorothiazide (HYZAAR) 100-25 MG tablet Take 1 tablet by mouth daily. Patient taking differently: Take 1 tablet by mouth in the morning. 11/04/21   Delynn Flavin M, DO    Blood pressure 105/68, pulse 89, temperature 98.8 F (37.1 C), temperature source Axillary, resp. rate 18, height 5\' 11"  (1.803 m), weight 124.6 kg, SpO2 98 %. Physical Exam: General: pleasant, WD/WN male who is laying in bed in NAD HEENT: head is normocephalic, atraumatic.  Sclera are noninjected.  Pupils equal and round***.  Ears and nose without any masses or lesions.  Mouth is pink and moist. Dentition fair*** Heart: regular, rate, and rhythm.  Normal s1,s2. No obvious murmurs, gallops, or rubs noted.  Palpable radial and pedal pulses bilaterally  Lungs: CTAB, no wheezes, rhonchi, or rales noted.  Respiratory effort nonlabored Abd: ***incisions, soft, NT/ND, +BS, no masses, hernias, or organomegaly MS: no BUE/BLE edema, calves soft and nontender*** Skin: warm and dry with no masses, lesions, or rashes Psych: A&Ox4 with an appropriate affect*** Neuro: MAEs, no gross motor or sensory deficits BUE/BLE***  Results for orders placed or performed during the hospital encounter of 11/22/22 (from the past 48 hour(s))  Glucose, capillary     Status: Abnormal   Collection Time: 11/29/22  4:29 PM   Result Value Ref Range   Glucose-Capillary 115 (H) 70 - 99 mg/dL    Comment: Glucose reference range applies only to samples taken after fasting for at least 8 hours.  Glucose, capillary     Status: Abnormal   Collection Time: 11/29/22  8:40 PM  Result Value Ref Range   Glucose-Capillary 130 (H) 70 - 99 mg/dL    Comment: Glucose  reference range applies only to samples taken after fasting for at least 8 hours.   Comment 1 Notify RN    Comment 2 Document in Chart   Glucose, capillary     Status: Abnormal   Collection Time: 11/30/22 12:23 AM  Result Value Ref Range   Glucose-Capillary 125 (H) 70 - 99 mg/dL    Comment: Glucose reference range applies only to samples taken after fasting for at least 8 hours.   Comment 1 Notify RN    Comment 2 Document in Chart   Glucose, capillary     Status: Abnormal   Collection Time: 11/30/22  4:24 AM  Result Value Ref Range   Glucose-Capillary 127 (H) 70 - 99 mg/dL    Comment: Glucose reference range applies only to samples taken after fasting for at least 8 hours.   Comment 1 Notify RN    Comment 2 Document in Chart   Triglycerides     Status: Abnormal   Collection Time: 11/30/22  5:08 AM  Result Value Ref Range   Triglycerides 206 (H) <150 mg/dL    Comment: Performed at [facility], [address] [place], [place] [address]  Comprehensive metabolic panel     Status: Abnormal   Collection Time: 11/30/22  5:08 AM  Result Value Ref Range   Sodium 138 135 - 145 mmol/L   Potassium 4.3 3.5 - 5.1 mmol/L   Chloride 107 98 - 111 mmol/L   CO2 23 22 - 32 mmol/L   Glucose, Bld 140 (H) 70 - 99 mg/dL    Comment: Glucose reference range applies only to samples taken after fasting for at least 8 hours.   BUN 39 (H) 6 - 20 mg/dL   Creatinine, Ser 1.16 0.61 - 1.24 mg/dL   Calcium 8.5 (L) 8.9 - 10.3 mg/dL   Total Protein 6.7 6.5 - 8.1 g/dL   Albumin 2.9 (L) 3.5 - 5.0 g/dL   AST 95 (H) 15 - 41 U/L   ALT 105 (H) 0 - 44 U/L   Alkaline Phosphatase 89 38 -  126 U/L   Total Bilirubin 0.4 0.3 - 1.2 mg/dL   GFR, Estimated >60 >60 mL/min    Comment: (NOTE) Calculated using the CKD-EPI Creatinine Equation (2021)    Anion gap 8 5 - 15    Comment: Performed at [facility], [address], [place], [place] [address]  Magnesium     Status: Abnormal   Collection Time: 11/30/22  5:08 AM  Result Value Ref Range   Magnesium 3.1 (H) 1.7 - 2.4 mg/dL    Comment: Performed at [facility], [address], [place], [place] [address]  Phosphorus     Status: None   Collection Time: 11/30/22  5:08 AM  Result Value Ref Range   Phosphorus 3.2 2.5 - 4.6 mg/dL    Comment: Performed at [facility], [address], [place], [place] [address]  CBC     Status: Abnormal   Collection Time: 11/30/22  5:08 AM  Result Value Ref Range   WBC 14.6 (H) 4.0 - 10.5 K/uL   RBC 3.63 (L) 4.22 - 5.81 MIL/uL   Hemoglobin 10.5 (L) 13.0 - 17.0 g/dL   HCT 32.5 (L) 39.0 - 52.0 %   MCV 89.5 80.0 - 100.0 fL   MCH 28.9 26.0 - 34.0 pg   MCHC 32.3 30.0 - 36.0 g/dL   RDW 13.2 11.5 - 15.5 %   Platelets 331 150 - 400 K/uL   nRBC 0.0  0.0 - 0.2 %    Comment: Performed at [facility], [address], [place], [place] [address]  Glucose, capillary     Status: Abnormal   Collection Time: 11/30/22  7:31 AM  Result Value Ref Range   Glucose-Capillary 139 (H) 70 - 99 mg/dL    Comment: Glucose reference range applies only to samples taken after fasting for at least 8 hours.  Glucose, capillary     Status: Abnormal   Collection Time: 11/30/22 11:33 AM  Result Value Ref Range   Glucose-Capillary 135 (H) 70 - 99 mg/dL    Comment: Glucose reference range applies only to samples taken after fasting for at least 8 hours.  Glucose, capillary     Status: Abnormal   Collection Time: 11/30/22  4:08 PM  Result Value Ref Range   Glucose-Capillary 130 (H) 70 - 99 mg/dL    Comment: Glucose reference range applies only to samples taken after fasting for at least 8 hours.  Glucose, capillary     Status:  Abnormal   Collection Time: 11/30/22  8:38 PM  Result Value Ref Range   Glucose-Capillary 117 (H) 70 - 99 mg/dL    Comment: Glucose reference range applies only to samples taken after fasting for at least 8 hours.  Glucose, capillary     Status: Abnormal   Collection Time: 12/01/22 12:34 AM  Result Value Ref Range   Glucose-Capillary 134 (H) 70 - 99 mg/dL    Comment: Glucose reference range applies only to samples taken after fasting for at least 8 hours.  Comprehensive metabolic panel     Status: Abnormal   Collection Time: 12/01/22  3:57 AM  Result Value Ref Range   Sodium 139 135 - 145 mmol/L   Potassium 4.5 3.5 - 5.1 mmol/L   Chloride 108 98 - 111 mmol/L   CO2 22 22 - 32 mmol/L   Glucose, Bld 133 (H) 70 - 99 mg/dL    Comment: Glucose reference range applies only to samples taken after fasting for at least 8 hours.   BUN 35 (H) 6 - 20 mg/dL   Creatinine, Ser 1.26 (H) 0.61 - 1.24 mg/dL   Calcium 8.7 (L) 8.9 - 10.3 mg/dL   Total Protein 7.0 6.5 - 8.1 g/dL   Albumin 3.0 (L) 3.5 - 5.0 g/dL   AST 60 (H) 15 - 41 U/L   ALT 92 (H) 0 - 44 U/L   Alkaline Phosphatase 100 38 - 126 U/L   Total Bilirubin 0.6 0.3 - 1.2 mg/dL   GFR, Estimated >60 >60 mL/min    Comment: (NOTE) Calculated using the CKD-EPI Creatinine Equation (2021)    Anion gap 9 5 - 15    Comment: Performed at [facility], [address], [place], [place] [address]  Magnesium     Status: Abnormal   Collection Time: 12/01/22  3:57 AM  Result Value Ref Range   Magnesium 2.6 (H) 1.7 - 2.4 mg/dL    Comment: Performed at [facility], [address] [place], [place] [address]  Phosphorus     Status: None   Collection Time: 12/01/22  3:57 AM  Result Value Ref Range   Phosphorus 3.3 2.5 - 4.6 mg/dL    Comment: Performed at [facility], [address], [place], [place] [address]  Triglycerides     Status: Abnormal   Collection Time: 12/01/22  3:57 AM  Result Value Ref Range   Triglycerides 172 (H) <150 mg/dL    Comment:  Performed  at [facility], [address] [place], [place] [address]  CBC     Status: Abnormal   Collection Time: 12/01/22  3:57 AM  Result Value Ref Range   WBC 17.7 (H) 4.0 - 10.5 K/uL   RBC 3.73 (L) 4.22 - 5.81 MIL/uL   Hemoglobin 10.8 (L) 13.0 - 17.0 g/dL   HCT 33.8 (L) 39.0 - 52.0 %   MCV 90.6 80.0 - 100.0 fL   MCH 29.0 26.0 - 34.0 pg   MCHC 32.0 30.0 - 36.0 g/dL   RDW 13.6 11.5 - 15.5 %   Platelets 356 150 - 400 K/uL   nRBC 0.1 0.0 - 0.2 %    Comment: Performed at [facility], [address], [place], [place] [address]  Glucose, capillary     Status: Abnormal   Collection Time: 12/01/22  4:34 AM  Result Value Ref Range   Glucose-Capillary 125 (H) 70 - 99 mg/dL    Comment: Glucose reference range applies only to samples taken after fasting for at least 8 hours.  Glucose, capillary     Status: Abnormal   Collection Time: 12/01/22  6:46 AM  Result Value Ref Range   Glucose-Capillary 127 (H) 70 - 99 mg/dL    Comment: Glucose reference range applies only to samples taken after fasting for at least 8 hours.  Glucose, capillary     Status: Abnormal   Collection Time: 12/01/22  7:36 AM  Result Value Ref Range   Glucose-Capillary 126 (H) 70 - 99 mg/dL    Comment: Glucose reference range applies only to samples taken after fasting for at least 8 hours.  Glucose, capillary     Status: Abnormal   Collection Time: 12/01/22 11:26 AM  Result Value Ref Range   Glucose-Capillary 127 (H) 70 - 99 mg/dL    Comment: Glucose reference range applies only to samples taken after fasting for at least 8 hours.   DG Abd 2 Views  Result Date: 12/01/2022 CLINICAL DATA:  Postoperative ileus EXAM: ABDOMEN - 2 VIEW COMPARISON:  Portable exam at 1113 hrs compared to 11/27/2022 FINDINGS: Nasogastric tube projects over stomach. Nonobstructive bowel gas with air-filled nondistended loops of bowel throughout the abdomen. Small amount of stool in RIGHT colon. Decreased bowel distension since previous exam. No  definite bowel dilatation/obstruction. Elevation of LEFT diaphragm. IMPRESSION: Nonobstructive bowel gas pattern with decreased bowel distension since previous study. Electronically Signed   By: Lavonia Dana M.D.   On: 12/01/2022 11:27    Anti-infectives (From admission, onward)    Start     Dose/Rate Route Frequency Ordered Stop   11/27/22 1600  piperacillin-tazobactam (ZOSYN) IVPB 3.375 g        3.375 g 12.5 mL/hr over 240 Minutes Intravenous Every 8 hours 11/27/22 1525     11/22/22 1400  ceFAZolin (ANCEF) IVPB 3g/100 mL premix        3 g 200 mL/hr over 30 Minutes Intravenous  Once 11/22/22 1202 11/22/22 1245   11/22/22 1208  ceFAZolin (ANCEF) 3-0.9 GM/100ML-% IVPB       Note to Pharmacy: Lear Ng S: cabinet override      11/22/22 1208 11/22/22 1310   11/22/22 1200  ceFAZolin (ANCEF) IVPB 2g/100 mL premix  Status:  Discontinued        2 g 200 mL/hr over 30 Minutes Intravenous Every 8 hours 11/22/22 1149 11/22/22 1202        Assessment/Plan Incarcerated umbilical hernia  Postoperative ileus -POD#9 s/p Open umbilical hernia repair with  mesh 12/9 Dr. Okey Dupre -POD#1 s/p Evacuation of postoperative abdominal wall hematoma  12/17 Dr. Okey Dupre - NPO/NGT to [facility] and await return in bowel function. Continue Reglan, Miralax, and dulcolax suppositories for ileus - Continue TPN for nutritional support - Abdominal pressure dressing in place. Plan for removal 12/02/22 with bacitracin to the staple line   ID - currently zosyn 12/14>> (for pneumonia) VTE - lovenox FEN - IVF, NPO, TPN Foley - none  VDRF - per CCM Paroxysmal atrial fibrillation/RVR-now in sinus rhythm  AKI  HTN HLD GERD Hx asthma OSA/OHS with known left hemidiaphragm paralysis  Obesity, BMI 38  I reviewed {Reviewed data:26882::"last 24 h vitals and pain scores","last 48 h intake and output","last 24 h labs and trends","last 24 h imaging results"}   ***, PA-C [facility] 12/01/2022, 2:40  PM Please see Amion for pager number during day hours 7:00am-4:30pm

## 2022-12-01 NOTE — Progress Notes (Signed)
PHARMACY - TOTAL PARENTERAL NUTRITION CONSULT NOTE   Indication: Prolonged ileus  Patient Measurements: Height: 5\' 11"  (180.3 cm) Weight: 124.6 kg (274 lb 11.1 oz) IBW/kg (Calculated) : 75.3 TPN AdjBW (KG): 88.9 Body mass index is 38.31 kg/m. Usual Weight:   Assessment:   Glucose / Insulin: 117 - 135. 12 units/24 hours Electrolytes:  Mag 3.2> 2.6, (will add back to TPN ) Renal: WNL Hepatic: TG 172 Intake / Output; 1061/ 1700 GI Imaging: GI Surgeries / Procedures:   Central access: PICC TPN start date: 12/15  Nutritional Goals: Goal TPN rate is 100 mL/hr (provides 144 g of protein(576 kcal) and CHO 1142 kcals per day) total of 1718kcal from TPN Patient currently on propofol @ 19 mL/hr ==> ~501 kcal/day   RD Assessment: Estimated Needs Total Energy Estimated Needs: 2400 Total Protein Estimated Needs: 150-160 gr Total Fluid Estimated Needs: >2 liters daily  Current Nutrition:  NPO  Plan:  Continue TPN to 100 mL/hr at 1800- no lipids in TPN since patient is receiving  propofol.Possible extubation soon so will monitor. Electrolytes in TPN: Na 17mEq/L, K 45mEq/L, Ca 84mEq/L, Mg 17mEq/L, and Phos 54mmol/L. Cl:Ac 1:1 Add standard MVI and trace elements to TPN Continue Resistant q6h SSI and adjust as needed  CMP in AM Monitor TPN labs on Mon/Thurs,   9m, BS Pharm D, BCPS Clinical Pharmacist 12/01/2022 8:25 AM

## 2022-12-01 NOTE — Progress Notes (Signed)
PROGRESS NOTE    Kyle Burns  [id number] DOB: 1966-05-30 DOA: 11/22/2022 PCP: Janora Norlander, DO   Brief Narrative:     Kyle Burns  is a 56 y.o. male, with past medical history of hypertension, asthma, GERD, hyperlipidemia, O SA/OHS, BiPAP dependent with oxygen at nighttime. -Patient presents to ED secondary to complaints of abdominal pain, developed yesterday, started having generalized abdominal pain, nausea, after eating [place] food, he denies any vomiting, he does report dry heaving as well, he denies any fever, chills, no dysuria or polyuria, he denies any obstipation or constipation, no diarrhea, no bright red blood per rectum, no chest pain, orts pain has worsened overnight which prompted him to come to ED -Patient underwent open umbilical hernia repair with placement of mesh on 12/9.  He is noted to have worsening abdominal distention suspected to be related to ileus and now has worsening respiratory distress and hypoxemia requiring intubation on 12/13.  He also developed atrial fibrillation with RVR and has remained in sinus rhythm and therefore amiodarone infusion has been discontinued.  Ileus appears to be slowly improving with increase in bowel movements and less NG tube output noted.  TPN initiated 12/15.  Continues to have persistent ileus in the setting of large umbilical hematoma noted on CT abdomen 12/16.  He underwent evacuation of postoperative abdominal wall hematoma on 12/17 and family is now requesting transfer to ICU bed at [facility].  Assessment & Plan:   Principal Problem:   Incarcerated umbilical hernia Active Problems:   Hyperlipidemia   Obstructive sleep apnea   Gastroesophageal reflux disease   BMI 40.0-44.9, adult (HCC)   Mild intermittent asthma   Nodule of right lung   Acute respiratory failure with hypoxia (HCC)   Atrial fibrillation with rapid ventricular response (HCC)   Small bowel obstruction (HCC)   Septic shock (HCC)   AKI (acute  kidney injury) ([place])  Assessment and Plan:   Acute hypoxemic respiratory failure secondary to worsening abdominal distention -Underlying OSA/OHS with known left hemidiaphragm paralysis -Intubated 12/13 -Appreciate ongoing PCCM evaluation, holding off on extubation until bowel distention improves -Plan to continue intubation through today and transfer to ICU service at [facility] per family request.  Did not do well with SBT this a.m.   Septic shock with fever-resolved -Hypotension related to sedation and worsening abdominal distention as well as concern for aspiration pneumonia -Levophed has been weaned off -Continue IV Zosyn for possible aspiration coverage, will not de-escalate at this time and continue on coverage day 5/7 -Chest x-ray appears to look okay 12/16, blood cultures ordered -Continue to monitor a.m. labs as well as lactic acid -CT abdomen and pelvis 12/16 with large hematoma and ongoing ileus, general surgery planning for intra-abdominal washout   Paroxysmal atrial fibrillation/RVR-now in sinus rhythm -Now in sinus rhythm, related to respiratory condition -2D echocardiogram with normal LVEF and grade 1 diastolic dysfunction -Amiodarone discontinued -Appreciate cardiology evaluation -No need for anticoagulation at this point   AKI-resolved -Continue IV fluid with TPN ongoing -Monitor urine output -Avoid nephrotoxic agents -A.m. labs   Incarcerated umbilical hernia/SBO s/p open umbilical hernia repair with mesh 12/9 now with ileus -Appears to slowly be improving -Continue n.p.o. with NG tube to LIS -Possible aspiration with recurrent vomiting and worsening abdominal distention -May require repeat imaging especially with potential worsening hematoma -Potassium goal of 4.0 and magnesium 2.0 -Continue to restrict fentanyl drip as much as possible -Remains on TPN since 12/15 -CT abdomen pelvis with large hematoma and patient  has undergone evacuation of postoperative  abdominal wall hematoma on 12/17 -KUB on 12/18 with improving abdominal wall distention and nonobstructive bowel gas pattern   History of hypertension -Currently hypotensive and on pressors, holding home medications   Obstructive sleep apnea/obesity hypoventilation syndrome -Currently intubated -Plan to extubate to BiPAP once ileus improves   Dyslipidemia -Statin on discharge   GERD -PPI   History of asthma -Appears to be mild and intermittent -Continue as needed bronchodilator management. -Continue the use of Singulair. -No wheezing on exam.   Class III obesity -Body mass index is 39.46 kg/m. -Low-calorie diet, portion control and increase physical activity discussed with patient.   Incidental 4 mm right lung nodule -Outpatient follow-up with pulmonary service recommended.      DVT prophylaxis:Lovenox Code Status: Full Family Communication: None at bedside; Mother called by [place] 12/18 who is requesting transfer Disposition Plan:  Status is: Inpatient Remains inpatient appropriate because: Need for IV medications   Consultants:  Gen Surg Cardiology Pulm   Procedures:  Open umbilical hernia repair with mesh Intubation 12/13 Evacuation of postoperative abdominal wall hematoma 12/17   Antimicrobials:  Anti-infectives (From admission, onward)    Start     Dose/Rate Route Frequency Ordered Stop   11/27/22 1600  piperacillin-tazobactam (ZOSYN) IVPB 3.375 g        3.375 g 12.5 mL/hr over 240 Minutes Intravenous Every 8 hours 11/27/22 1525     11/22/22 1400  ceFAZolin (ANCEF) IVPB 3g/100 mL premix        3 g 200 mL/hr over 30 Minutes Intravenous  Once 11/22/22 1202 11/22/22 1245   11/22/22 1208  ceFAZolin (ANCEF) 3-0.9 GM/100ML-% IVPB       Note to Pharmacy: Lear Ng S: cabinet override      11/22/22 1208 11/22/22 1310   11/22/22 1200  ceFAZolin (ANCEF) IVPB 2g/100 mL premix  Status:  Discontinued        2 g 200 mL/hr over 30 Minutes Intravenous Every 8 hours  11/22/22 1149 11/22/22 1202      Subjective: Patient seen and evaluated today with no acute overnight events noted.  He was undergoing sedation weaning as well as SBT this a.m., but did not do well enough to extubate.  Mother now requesting transfer to ICU at [facility] which will be arranged.  Objective: Vitals:   12/01/22 1000 12/01/22 1056 12/01/22 1100 12/01/22 1131  BP: 100/64  101/72   Pulse: 82  77 98  Resp: (!) 25  (!) 22 (!) 22  Temp:    99.9 F (37.7 C)  TempSrc:    Axillary  SpO2: 97% 98% 97% 99%  Weight:      Height:        Intake/Output Summary (Last 24 hours) at 12/01/2022 1134 Last data filed at 12/01/2022 0931 Gross per 24 hour  Intake 2883.96 ml  Output 5200 ml  Net -2316.04 ml   Filed Weights   11/28/22 1114 11/30/22 0610 12/01/22 0500  Weight: 127.3 kg 127.7 kg 124.6 kg    Examination:  General exam: Appears calm and comfortable, obese Respiratory system: Clear to auscultation. Respiratory effort normal.  Currently intubated with FiO2 35% Cardiovascular system: S1 & S2 heard, RRR.  Gastrointestinal system: Abdomen with large dressing present C/C/I Central nervous system: Sedated, but arousable Extremities: No edema Skin: No significant lesions noted    Data Reviewed: I have personally reviewed following labs and imaging studies  CBC: Recent Labs  Lab 11/26/22 0925 11/27/22 0432 11/28/22 0432 11/29/22  [id number] 11/30/22 0508 12/01/22 0357  WBC 14.6* 13.2* 12.5* 12.6* 14.6* 17.7*  NEUTROABS 10.3*  --   --   --   --   --   HGB 14.8 12.8* 10.7* 11.3* 10.5* 10.8*  HCT 44.0 38.0* 32.5* 34.6* 32.5* 33.8*  MCV 86.3 85.4 87.1 88.7 89.5 90.6  PLT 409* 366 326 310 331 [id number]   Basic Metabolic Panel: Recent Labs  Lab 11/27/22 0432 11/28/22 0432 11/29/22 0418 11/30/22 0508 12/01/22 0357  NA 130* 135 137 138 139  K 3.1* 3.2* 3.6 4.3 4.5  CL 93* 100 103 107 108  CO2 26 27 26 23 22   GLUCOSE 140* 125* 111* 140* 133*  BUN 78* 57* 53* 39* 35*   CREATININE 2.89* 1.73* 1.61* 1.16 1.26*  CALCIUM 8.1* 7.5* 7.9* 8.5* 8.7*  MG 3.8* 3.6* 3.8* 3.1* 2.6*  PHOS  --  2.8 3.6 3.2 3.3   GFR: Estimated Creatinine Clearance: 85.9 mL/min (A) (by C-G formula based on SCr of 1.26 mg/dL (H)). Liver Function Tests: Recent Labs  Lab 11/29/22 0418 11/30/22 0508 12/01/22 0357  AST 60* 95* 60*  ALT 58* 105* 92*  ALKPHOS 76 89 100  BILITOT 0.6 0.4 0.6  PROT 6.2* 6.7 7.0  ALBUMIN 2.7* 2.9* 3.0*   No results for input(s): "LIPASE", "AMYLASE" in the last 168 hours. No results for input(s): "AMMONIA" in the last 168 hours. Coagulation Profile: No results for input(s): "INR", "PROTIME" in the last 168 hours. Cardiac Enzymes: No results for input(s): "CKTOTAL", "CKMB", "CKMBINDEX", "TROPONINI" in the last 168 hours. BNP (last 3 results) No results for input(s): "PROBNP" in the last 8760 hours. HbA1C: No results for input(s): "HGBA1C" in the last 72 hours. CBG: Recent Labs  Lab 12/01/22 0034 12/01/22 0434 12/01/22 0646 12/01/22 0736 12/01/22 1126  GLUCAP 134* 125* 127* 126* 127*   Lipid Profile: Recent Labs    11/30/22 0508 12/01/22 0357  TRIG 206* 172*   Thyroid Function Tests: No results for input(s): "TSH", "T4TOTAL", "FREET4", "T3FREE", "THYROIDAB" in the last 72 hours. Anemia Panel: No results for input(s): "VITAMINB12", "FOLATE", "FERRITIN", "TIBC", "IRON", "RETICCTPCT" in the last 72 hours. Sepsis Labs: No results for input(s): "PROCALCITON", "LATICACIDVEN" in the last 168 hours.  Recent Results (from the past 240 hour(s))  MRSA Next Gen by PCR, Nasal     Status: None   Collection Time: 11/22/22  5:25 PM   Specimen: Nasal Mucosa; Nasal Swab  Result Value Ref Range Status   MRSA by PCR Next Gen NOT DETECTED NOT DETECTED Final    Comment: (NOTE) The GeneXpert MRSA Assay (FDA approved for NASAL specimens only), is one component of a comprehensive MRSA colonization surveillance program. It is not intended to diagnose  MRSA infection nor to guide or monitor treatment for MRSA infections. Test performance is not FDA approved in patients less than 95 years old. Performed at [facility], [address], [place], [place] [address]   Culture, Respiratory w Gram Stain     Status: None   Collection Time: 11/26/22  3:58 PM   Specimen: Tracheal Aspirate; Respiratory  Result Value Ref Range Status   Specimen Description   Final    TRACHEAL ASPIRATE Performed at [facility], [address] [place], [place] [address]    Special Requests   Final    NONE Performed at [facility], [address] [place], [place] [address]    Gram Stain   Final    RARE WBC PRESENT, PREDOMINANTLY PMN MODERATE GRAM NEGATIVE  RODS    Culture   Final    ABUNDANT SERRATIA MARCESCENS FEW KLUYVERA ASCORBATA NO STAPHYLOCOCCUS AUREUS ISOLATED No Pseudomonas species isolated Performed at [facility] [address], [place], [place] [address]    Report Status 11/30/2022 FINAL  Final   Organism ID, Bacteria SERRATIA MARCESCENS  Final   Organism ID, Bacteria KLUYVERA ASCORBATA  Final      Susceptibility   Kluyvera ascorbata - MIC*    AMPICILLIN 16 INTERMEDIATE Intermediate     CEFAZOLIN >=64 RESISTANT Resistant     CEFEPIME <=0.12 SENSITIVE Sensitive     CEFTAZIDIME <=1 SENSITIVE Sensitive     CEFTRIAXONE 8 RESISTANT Resistant     CIPROFLOXACIN <=0.25 SENSITIVE Sensitive     GENTAMICIN <=1 SENSITIVE Sensitive     IMIPENEM <=0.25 SENSITIVE Sensitive     TRIMETH/SULFA <=20 SENSITIVE Sensitive     AMPICILLIN/SULBACTAM <=2 SENSITIVE Sensitive     PIP/TAZO <=4 SENSITIVE Sensitive     * FEW KLUYVERA ASCORBATA   Serratia marcescens - MIC*    CEFAZOLIN >=64 RESISTANT Resistant     CEFEPIME <=0.12 SENSITIVE Sensitive     CEFTAZIDIME <=1 SENSITIVE Sensitive     CEFTRIAXONE <=0.25 SENSITIVE Sensitive     CIPROFLOXACIN <=0.25 SENSITIVE Sensitive     GENTAMICIN <=1 SENSITIVE Sensitive     TRIMETH/SULFA <=20 SENSITIVE Sensitive      * ABUNDANT SERRATIA MARCESCENS  Culture, blood (Routine X 2) w Reflex to ID Panel     Status: None (Preliminary result)   Collection Time: 11/29/22  8:29 AM   Specimen: BLOOD LEFT HAND  Result Value Ref Range Status   Specimen Description   Final    BLOOD LEFT HAND BOTTLES DRAWN AEROBIC AND ANAEROBIC   Special Requests   Final    Blood Culture results may not be optimal due to an excessive volume of blood received in culture bottles   Culture   Final    NO GROWTH 2 DAYS Performed at [facility], [address], [place], [place] [address]    Report Status PENDING  Incomplete  Culture, blood (Routine X 2) w Reflex to ID Panel     Status: None (Preliminary result)   Collection Time: 11/29/22  8:29 AM   Specimen: BLOOD LEFT WRIST  Result Value Ref Range Status   Specimen Description BLOOD LEFT WRIST AEROBIC BOTTLE ONLY  Final   Special Requests Blood Culture adequate volume  Final   Culture   Final    NO GROWTH 2 DAYS Performed at [facility], [address], [place], [place] [address]    Report Status PENDING  Incomplete         Radiology Studies: DG Abd 2 Views  Result Date: 12/01/2022 CLINICAL DATA:  Postoperative ileus EXAM: ABDOMEN - 2 VIEW COMPARISON:  Portable exam at 1113 hrs compared to 11/27/2022 FINDINGS: Nasogastric tube projects over stomach. Nonobstructive bowel gas with air-filled nondistended loops of bowel throughout the abdomen. Small amount of stool in RIGHT colon. Decreased bowel distension since previous exam. No definite bowel dilatation/obstruction. Elevation of LEFT diaphragm. IMPRESSION: Nonobstructive bowel gas pattern with decreased bowel distension since previous study. Electronically Signed   By: Lavonia Dana M.D.   On: 12/01/2022 11:27   CT ABDOMEN PELVIS WO CONTRAST  Result Date: 11/29/2022 CLINICAL DATA:  56 year old male with abdominal pain following umbilical hernia repair on 11/22/2022. Hematoma overlying the umbilical hernia repair. EXAM: CT  ABDOMEN AND PELVIS WITHOUT CONTRAST TECHNIQUE: Multidetector CT imaging of the  abdomen and pelvis was performed following the standard protocol without IV contrast. RADIATION DOSE REDUCTION: This exam was performed according to the departmental dose-optimization program which includes automated exposure control, adjustment of the mA and/or kV according to patient size and/or use of iterative reconstruction technique. COMPARISON:  11/22/2022 CT FINDINGS: Please note that parenchymal and vascular abnormalities may be missed as intravenous contrast was not administered. Lower chest: RIGHT LOWER lobe consolidation/atelectasis noted. Hepatobiliary: Hepatic steatosis identified without focal hepatic abnormality. The gallbladder is unremarkable. There is no evidence of intrahepatic or extrahepatic biliary dilatation. Pancreas: Unremarkable Spleen: Unremarkable Adrenals/Urinary Tract: The kidneys and adrenal glands are unremarkable. Foley catheter is present within the bladder. Stomach/Bowel: An NG tube is present within the mid stomach. There are mildly distended proximal and mid small bowel loops with nondistended distal small bowel loops. No discrete transition point is identified. The appendix is unremarkable. No definite bowel wall thickening identified. Vascular/Lymphatic: No significant vascular findings are present. No enlarged abdominal or pelvic lymph nodes. Reproductive: Unremarkable Other: A 9.1 x 6.6 x 11.5 cm high density structure in the periumbilical subcutaneous tissues is compatible with a hematoma. Small bowel loops are noted extending to the undersurface of the abdominal wall in this area, but it is difficult to determine if there is recurrence of hernia in this area. There is no evidence of discrete transition change of bowel in this region however. No evidence of ascites, pneumoperitoneum or other focal collection. Musculoskeletal: Bilateral inguinal hernia repair noted with moderate RIGHT inguinal  hernia containing fat again noted. IMPRESSION: 1. 9.1 x 6.6 x 11.5 cm paraumbilical subcutaneous hematoma. Small bowel loops are noted extending to the undersurface of the abdominal wall in this area, but it is difficult to determine if there is recurrence of hernia in this area. No evidence of discrete transition change of bowel in this region however. Consider follow-up CT with enteric contrast, as clinically indicated. 2. Mildly distended proximal and mid small bowel loops with nondistended distal small bowel loops. No discrete transition point identified. Favor ileus over low-grade partial small bowel obstruction. 3. RIGHT LOWER lobe consolidation/atelectasis. 4. Hepatic steatosis. 5. Bilateral inguinal hernia repair with moderate RIGHT inguinal hernia containing fat. Electronically Signed   By: Harmon Pier M.D.   On: 11/29/2022 13:20        Scheduled Meds:  bisacodyl  10 mg Rectal BID   budesonide (PULMICORT) nebulizer solution  0.5 mg Nebulization BID   Chlorhexidine Gluconate Cloth  6 each Topical Daily   docusate  100 mg Per Tube BID   enoxaparin (LOVENOX) injection  60 mg Subcutaneous Q24H   fluticasone  1 spray Each Nare Daily   insulin aspart  0-20 Units Subcutaneous Q6H   ipratropium  0.5 mg Nebulization Q6H   levalbuterol  0.63 mg Nebulization Q6H   metoCLOPramide (REGLAN) injection  10 mg Intravenous Q6H   montelukast  10 mg Per Tube QHS    morphine injection  2 mg Intravenous Once   mouth rinse  15 mL Mouth Rinse Q2H   pantoprazole (PROTONIX) IV  40 mg Intravenous Q12H   polyethylene glycol  17 g Oral Daily   sodium chloride flush  10-40 mL Intracatheter Q12H   Continuous Infusions:  sodium chloride     fentaNYL infusion INTRAVENOUS 25 mcg/hr (12/01/22 0931)   piperacillin-tazobactam (ZOSYN)  IV 12.5 mL/hr at 12/01/22 0931   propofol (DIPRIVAN) infusion 45 mcg/kg/min (12/01/22 0931)   TPN ADULT (ION) 100 mL/hr at 12/01/22 0931   TPN ADULT (  ION)       LOS: 9 days     Time spent: 35 minutes    [place] [facility] Darleen Crocker, DO [facility]  If 7PM-7AM, please contact night-coverage [email or web address] 12/01/2022, 11:34 AM

## 2022-12-01 NOTE — Progress Notes (Signed)
Made routine visit today and found patient mother, Aunt, and sister bedside. Chaplain engaged them in reflection around [place] and who he is to them. Sister had questions regarding when her brother might be transferred and Dr. Sherene Sires came by and was able to answer their questions.  Chaplain provided spiritual support and will remain available in order to provide spiritual support and to assess for spiritual need.   Rev. Jolyn Lent, M.Div Chaplain

## 2022-12-01 NOTE — Progress Notes (Addendum)
[facility] Progress Note  1 Day Post-Op  Subjective: Patient seen and examined.  He is sedated and intubated in the ICU.  He failed his SBT this morning.  Patient expressed frustrations regarding his difficult respiratory status.  NG tube has remained in place with 800 cc of bilious output overnight.  Last documented bowel movement was yesterday morning.  Objective: Vital signs in last 24 hours: Temp:  [98.6 F (37 C)-99.4 F (37.4 C)] 99.4 F (37.4 C) (12/18 0742) Pulse Rate:  [78-114] 82 (12/18 1000) Resp:  [18-29] 25 (12/18 1000) BP: (93-138)/(57-79) 100/64 (12/18 1000) SpO2:  [93 %-100 %] 98 % (12/18 1056) FiO2 (%):  [35 %-40 %] 40 % (12/18 1056) Weight:  [124.6 kg] 124.6 kg (12/18 0500) Last BM Date : 11/30/22  Intake/Output from previous day: 12/17 0701 - 12/18 0700 In: 2763 [I.V.:2626; IV Piggyback:137] Out: 4000 [Urine:1000; Emesis/NG output:1200] Intake/Output this shift: Total I/O In: 690.9 [I.V.:656.4; IV Piggyback:34.5] Out: 1200 [Urine:400; Emesis/NG output:800]  General appearance: Intubated and sedated in ICU Nose: NG tube in place with bilious output in canister  GI: Abdomen soft, mild distention, nontender to palpation; no rigidity or guarding; abdominal pressure dressing in place  Lab Results:  Recent Labs    11/30/22 0508 12/01/22 0357  WBC 14.6* 17.7*  HGB 10.5* 10.8*  HCT 32.5* 33.8*  PLT 331 356   BMET Recent Labs    11/30/22 0508 12/01/22 0357  NA 138 139  K 4.3 4.5  CL 107 108  CO2 23 22  GLUCOSE 140* 133*  BUN 39* 35*  CREATININE 1.16 1.26*  CALCIUM 8.5* 8.7*   PT/INR No results for input(s): "LABPROT", "INR" in the last 72 hours.  Studies/Results: CT ABDOMEN PELVIS WO CONTRAST  Result Date: 11/29/2022 CLINICAL DATA:  56 year old male with abdominal pain following umbilical hernia repair on 11/22/2022. Hematoma overlying the umbilical hernia repair. EXAM: CT ABDOMEN AND PELVIS WITHOUT CONTRAST TECHNIQUE:  Multidetector CT imaging of the abdomen and pelvis was performed following the standard protocol without IV contrast. RADIATION DOSE REDUCTION: This exam was performed according to the departmental dose-optimization program which includes automated exposure control, adjustment of the mA and/or kV according to patient size and/or use of iterative reconstruction technique. COMPARISON:  11/22/2022 CT FINDINGS: Please note that parenchymal and vascular abnormalities may be missed as intravenous contrast was not administered. Lower chest: RIGHT LOWER lobe consolidation/atelectasis noted. Hepatobiliary: Hepatic steatosis identified without focal hepatic abnormality. The gallbladder is unremarkable. There is no evidence of intrahepatic or extrahepatic biliary dilatation. Pancreas: Unremarkable Spleen: Unremarkable Adrenals/Urinary Tract: The kidneys and adrenal glands are unremarkable. Foley catheter is present within the bladder. Stomach/Bowel: An NG tube is present within the mid stomach. There are mildly distended proximal and mid small bowel loops with nondistended distal small bowel loops. No discrete transition point is identified. The appendix is unremarkable. No definite bowel wall thickening identified. Vascular/Lymphatic: No significant vascular findings are present. No enlarged abdominal or pelvic lymph nodes. Reproductive: Unremarkable Other: A 9.1 x 6.6 x 11.5 cm high density structure in the periumbilical subcutaneous tissues is compatible with a hematoma. Small bowel loops are noted extending to the undersurface of the abdominal wall in this area, but it is difficult to determine if there is recurrence of hernia in this area. There is no evidence of discrete transition change of bowel in this region however. No evidence of ascites, pneumoperitoneum or other focal collection. Musculoskeletal: Bilateral inguinal hernia repair noted with moderate RIGHT inguinal hernia containing fat  again noted. IMPRESSION: 1.  9.1 x 6.6 x 11.5 cm paraumbilical subcutaneous hematoma. Small bowel loops are noted extending to the undersurface of the abdominal wall in this area, but it is difficult to determine if there is recurrence of hernia in this area. No evidence of discrete transition change of bowel in this region however. Consider follow-up CT with enteric contrast, as clinically indicated. 2. Mildly distended proximal and mid small bowel loops with nondistended distal small bowel loops. No discrete transition point identified. Favor ileus over low-grade partial small bowel obstruction. 3. RIGHT LOWER lobe consolidation/atelectasis. 4. Hepatic steatosis. 5. Bilateral inguinal hernia repair with moderate RIGHT inguinal hernia containing fat. Electronically Signed   By: Harmon Pier M.D.   On: 11/29/2022 13:20    Anti-infectives: Anti-infectives (From admission, onward)    Start     Dose/Rate Route Frequency Ordered Stop   11/27/22 1600  piperacillin-tazobactam (ZOSYN) IVPB 3.375 g        3.375 g 12.5 mL/hr over 240 Minutes Intravenous Every 8 hours 11/27/22 1525     11/22/22 1400  ceFAZolin (ANCEF) IVPB 3g/100 mL premix        3 g 200 mL/hr over 30 Minutes Intravenous  Once 11/22/22 1202 11/22/22 1245   11/22/22 1208  ceFAZolin (ANCEF) 3-0.9 GM/100ML-% IVPB       Note to Pharmacy: Elvina Sidle S: cabinet override      11/22/22 1208 11/22/22 1310   11/22/22 1200  ceFAZolin (ANCEF) IVPB 2g/100 mL premix  Status:  Discontinued        2 g 200 mL/hr over 30 Minutes Intravenous Every 8 hours 11/22/22 1149 11/22/22 1202       Assessment/Plan:  Patient is a 56 year old male who is s/p incarcerated umbilical hernia repair with mesh 12/09. Had post operative ileus and required NG and then required intubation for low lung volumes and possible PNA.    -CT abdomen and pelvis on 12/16 demonstrating hematoma with likely ileus -Patient s/p abdominal wall hematoma evacuation on 12/18 with no evidence of hernia recurrence -WBC  increased today to 17.7 from 14.6, likely partially reactive from surgery -Maintain NG to LIS -Miralax and Reglan ordered -Repeat abdominal KUB today with nonobstructive bowel gas pattern and decreased bowel distention -Abdominal pressure dressing in place.  Plan for removal tomorrow with bacitracin to the staple line -Continue TPN -Zosyn for pneumonia per primary team -Continue to wean ventilator as able.  Patient did not tolerate SBT today -Had a long discussion with patient's mother and family at bedside.  They expressed that they would like him transferred for respiratory monitoring and evaluation at [facility].  I explained that he has a complicated respiratory history (asthma/COPD, left hemidiaphragm paralysis) and this mixed with his recent ileus, has made extubation more difficult.  I did express that I think he is making improvements from a bowel standpoint, given no recurrence of hernia at recent surgery, ileus and not obstruction demonstrated on CT, and slow return of bowel function.  We will continue with a bowel regimen to promote his bowels waking up.  I did explain that Dr. Sherene Sires would like NG tube to be able to be removed at time of extubation, as patient will need BiPAP after extubation.  I told the family that I would reach out to the necessary team members to initiate transfer to [facility] ICU.  I will also reach out to the general surgery service at [facility] to monitor the patient from a surgical  perspective throughout his hospital course.  I expressed that once the patient is discharged from the hospital, I would like for him to follow-up with me regarding his surgical intervention.  All questions were answered to their expressed satisfaction   LOS: 9 days    Kyle Burns 12/01/2022

## 2022-12-01 NOTE — Progress Notes (Signed)
NAME:  Kyle Burns, MRN:  [id number], DOB:  1966-08-12, LOS: 9 ADMISSION DATE:  11/22/2022, CONSULTATION DATE:  12/01/2022  REFERRING MD:  Sherryll Burger TRH, CHIEF COMPLAINT: Respiratory failure  History of Present Illness:  6  yobm never smoker  with OSA/OHS (Bipap per Craige Cotta) and paralzyed L HD with mild intermittent asthma admitted for small bowel obstruction due to incarcerated umbilical hernia.  He underwent open umbilical hernia repair  12/9 with mesh placement.  Postop day 3 he developed abdominal distention and atrial fibrillation/RVR.  NG tube was placed and 3 L of fluid drained.   He required IV Cardizem and then amiodarone bolus and drip also digoxin 0.25 mg IV.  He developed dyspnea and was started on IV Zosyn due to concern for aspiration. 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 12/13 ETT >> 12/17 back to OR for  Evacuation of postoperative abdominal wall hematoma    Scheduled Meds:  bisacodyl  10 mg Rectal BID   Chlorhexidine Gluconate Cloth  6 each Topical Daily   docusate  100 mg Per Tube BID   enoxaparin (LOVENOX) injection  60 mg Subcutaneous Q24H   fluticasone  1 spray Each Nare Daily   insulin aspart  0-20 Units Subcutaneous Q6H   ipratropium  0.5 mg Nebulization Q6H   levalbuterol  0.63 mg Nebulization Q6H   montelukast  10 mg Per Tube QHS    morphine injection  2 mg Intravenous Once    mouth rinse  15 mL Mouth Rinse Q2H   pantoprazole (PROTONIX) IV  40 mg Intravenous Q12H   polyethylene glycol  17 g Oral Daily   sodium chloride flush  10-40 mL Intracatheter Q12H   Continuous Infusions:  sodium chloride     fentaNYL infusion INTRAVENOUS 25 mcg/hr (11/30/22 1533)   norepinephrine (LEVOPHED) Adult infusion Stopped (11/28/22 0847)   piperacillin-tazobactam (ZOSYN)  IV 3.375 g (11/30/22 2151)   propofol (DIPRIVAN) infusion 30 mcg/kg/min (11/30/22 1843)   TPN ADULT (ION) 100 mL/hr at 11/30/22 1730   PRN Meds:.alum & mag hydroxide-simeth, fentaNYL, hydrALAZINE, midazolam, morphine injection, ondansetron (ZOFRAN) IV, mouth rinse, oxyCODONE, phenol, pneumococcal 23 valent vaccine, prochlorperazine, sodium chloride flush     Interim History / Subjective:  On PSV s/p 2nd abd procedure 12/17 with acceptable wob/sats/ volumes but severe air trapping on graphics and large vol NG drainage has contintinued overnight  > 1200 cc but off pressors   Objective   Blood pressure 100/68, pulse 78, temperature 98.6 F (37 C), temperature source Axillary, resp. rate (!) 25, height 5\' 11"  (1.803 m), weight 127.7 kg, SpO2 96 %.    Vent Mode: PRVC FiO2 (%):  [35 %-40 %] 40 % Set Rate:  [20 bmp] 20 bmp Vt Set:  [550 mL] 550 mL PEEP:  [5 cmH20] 5 cmH20 Pressure Support:  [12 cmH20] 12 cmH20 Plateau Pressure:  [20 cmH20-24 cmH20] 22 cmH20   Intake/Output Summary (Last 24 hours) at  12/01/2022 [id number] Last data filed at 12/01/2022 0400 Gross per 24 hour  Intake 2891.48 ml  Output 4200 ml  Net -1308.52 ml   Filed Weights   11/28/22 0530 11/28/22 1114 11/30/22 0610  Weight: 127.3 kg 127.3 kg 127.7 kg     Examination:  Tmax:  99.6 General appearance:   late middle aged appearing obese bm comfortable on PSV    No jvd Oropharynx et  Neck supple Lungs with a  pan exp distant wheeze bilaterally RRR no s3 or or sign murmur Abd mod distended with very limited excursion  Extr warm with no  edema or clubbing noted/ PAS in place Neuro  Sensorium nods appropriately ,  no apparent motor deficits       Resolved Hospital Problem list     Assessment & Plan:  Acute respiratory failure with hypoxia , related to abdominal distention Underlying OSA/OHS Known left hemidiaphragm paralysis  Left lower lobe infiltrate is likely related to HAP or elevated left hemidiaphragm rather than effusion (see HCAP) Asthma with air trapping/ wheezing noted am 12/18   >>>Tolerating spontaneous breathing trials but will hold off extubation until bowel distention improves and clear that he will not need NG to suction because likely he will need BiPAP immediately on extubation  - continue bronchodilators / avoid steroids in this setting except per ET > added budesonide 12/18   HCAP / serratia  - trach culture 12/13 sensistive to Zosyn started 12/14>>>   Circulatory shock -Related to abdominal issues and sedation medications/propofol/ possible HCAP >>> continue off pressors and keep up with GI losses    AKI  Lab Results  Component Value Date   CREATININE 1.26 (H) 12/01/2022   CREATININE 1.16 11/30/2022   CREATININE 1.61 (H) 11/29/2022   -related to above  Trend BMET and urine output  Atrial fibrillation/RVR -converted to sinus rhythm -Related to respiratory worsening -Echo shows normal LVEF, hyperdynamic, grade 1 diastolic dysfunction    Small bowel obstruction vs post op ileus/incarcerated hernia repair -Surgery following, NG tube to LIS >>> trial of reglan 12/18 as critical we get gut working to get NG out prior to extubation  with bipap immediately likely needed     Best Practice (right click and "Reselect all SmartList Selections" daily)   Diet/type: NPO DVT prophylaxis: Lovenox  GI prophylaxis: PPI Lines: N/A Foley:  Yes, and it is still needed Code Status:  full code Last date of multidisciplinary goals of care discussion [mother at bedside]  Labs   CBC: Recent Labs   Lab 11/26/22 0925 11/27/22 0432 11/28/22 0432 11/29/22 0418 11/30/22 0508 12/01/22 0357  WBC 14.6* 13.2* 12.5* 12.6* 14.6* 17.7*  NEUTROABS 10.3*  --   --   --   --   --   HGB 14.8 12.8* 10.7* 11.3* 10.5* 10.8*  HCT 44.0 38.0* 32.5* 34.6* 32.5* 33.8*  MCV 86.3 85.4 87.1 88.7 89.5 90.6  PLT 409* 366 326 310 331 [id number]    Basic Metabolic Panel: Recent Labs  Lab 11/27/22 0432 11/28/22 0432 11/29/22 0418 11/30/22 0508 12/01/22 0357  NA 130* 135 137 138 139  K 3.1* 3.2* 3.6 4.3 4.5  CL 93* 100 103 107 108  CO2 26 27 26 23 22   GLUCOSE 140* 125* 111* 140* 133*  BUN 78* 57* 53* 39* 35*  CREATININE 2.89* 1.73* 1.61* 1.16 1.26*  CALCIUM 8.1* 7.5* 7.9* 8.5* 8.7*  MG 3.8* 3.6* 3.8* 3.1* 2.6*  PHOS  --  2.8 3.6 3.2 3.3   GFR:  Estimated Creatinine Clearance: 87 mL/min (A) (by C-G formula based on SCr of 1.26 mg/dL (H)). Recent Labs  Lab 11/28/22 0432 11/29/22 0418 11/30/22 0508 12/01/22 0357  WBC 12.5* 12.6* 14.6* 17.7*    Liver Function Tests: Recent Labs  Lab 11/29/22 0418 11/30/22 0508 12/01/22 0357  AST 60* 95* 60*  ALT 58* 105* 92*  ALKPHOS 76 89 100  BILITOT 0.6 0.4 0.6  PROT 6.2* 6.7 7.0  ALBUMIN 2.7* 2.9* 3.0*   No results for input(s): "LIPASE", "AMYLASE" in the last 168 hours. No results for input(s): "AMMONIA" in the last 168 hours.  ABG    Component Value Date/Time   PHART 7.45 11/26/2022 0859   PCO2ART 43 11/26/2022 0859   PO2ART 103 11/26/2022 0859   HCO3 29.9 (H) 11/26/2022 0859   TCO2 28 11/22/2022 0635   O2SAT 97.6 11/26/2022 0859     Coagulation Profile: No results for input(s): "INR", "PROTIME" in the last 168 hours.  Cardiac Enzymes: No results for input(s): "CKTOTAL", "CKMB", "CKMBINDEX", "TROPONINI" in the last 168 hours.  HbA1C: HB A1C (BAYER DCA - WAIVED)  Date/Time Value Ref Range Status  11/04/2021 04:18 PM 5.7 (H) 4.8 - 5.6 % Final    Comment:             Prediabetes: 5.7 - 6.4          Diabetes: >6.4          Glycemic  control for adults with diabetes: <7.0     CBG: Recent Labs  Lab 11/30/22 1133 11/30/22 1608 11/30/22 2038 12/01/22 0034 12/01/22 0434  GLUCAP 135* 130* 117* 134* 125*       The patient is critically ill with multiple organ systems failure and requires high complexity decision making for assessment and support, frequent evaluation and titration of therapies, application of advanced monitoring technologies and extensive interpretation of multiple databases. Critical Care Time devoted to patient care services described in this note is 45 minutes.   Christinia Gully, MD Pulmonary and [place] [phone]   After 7:00 pm call Elink  [phone]   .

## 2022-12-02 ENCOUNTER — Inpatient Hospital Stay (HOSPITAL_COMMUNITY): Payer: No Typology Code available for payment source

## 2022-12-02 DIAGNOSIS — J9601 Acute respiratory failure with hypoxia: Secondary | ICD-10-CM | POA: Diagnosis not present

## 2022-12-02 DIAGNOSIS — J189 Pneumonia, unspecified organism: Secondary | ICD-10-CM

## 2022-12-02 DIAGNOSIS — K567 Ileus, unspecified: Secondary | ICD-10-CM | POA: Diagnosis not present

## 2022-12-02 DIAGNOSIS — K420 Umbilical hernia with obstruction, without gangrene: Secondary | ICD-10-CM | POA: Diagnosis not present

## 2022-12-02 DIAGNOSIS — J9602 Acute respiratory failure with hypercapnia: Secondary | ICD-10-CM

## 2022-12-02 LAB — COMPREHENSIVE METABOLIC PANEL
ALT: 101 U/L — ABNORMAL HIGH (ref 0–44)
AST: 66 U/L — ABNORMAL HIGH (ref 15–41)
Albumin: 3 g/dL — ABNORMAL LOW (ref 3.5–5.0)
Alkaline Phosphatase: 121 U/L (ref 38–126)
Anion gap: 8 (ref 5–15)
BUN: 40 mg/dL — ABNORMAL HIGH (ref 6–20)
CO2: 23 mmol/L (ref 22–32)
Calcium: 9.1 mg/dL (ref 8.9–10.3)
Chloride: 108 mmol/L (ref 98–111)
Creatinine, Ser: 1.41 mg/dL — ABNORMAL HIGH (ref 0.61–1.24)
GFR, Estimated: 58 mL/min — ABNORMAL LOW (ref >60)
Glucose, Bld: 119 mg/dL — ABNORMAL HIGH (ref 70–99)
Potassium: 4.6 mmol/L (ref 3.5–5.1)
Sodium: 139 mmol/L (ref 135–145)
Total Bilirubin: 0.5 mg/dL (ref 0.3–1.2)
Total Protein: 7.1 g/dL (ref 6.5–8.1)

## 2022-12-02 LAB — GLUCOSE, CAPILLARY
Glucose-Capillary: 127 mg/dL — ABNORMAL HIGH (ref 70–99)
Glucose-Capillary: 109 mg/dL — ABNORMAL HIGH (ref 70–99)
Glucose-Capillary: 108 mg/dL — ABNORMAL HIGH (ref 70–99)
Glucose-Capillary: 117 mg/dL — ABNORMAL HIGH (ref 70–99)
Glucose-Capillary: 112 mg/dL — ABNORMAL HIGH (ref 70–99)
Glucose-Capillary: 119 mg/dL — ABNORMAL HIGH (ref 70–99)
Glucose-Capillary: 107 mg/dL — ABNORMAL HIGH (ref 70–99)

## 2022-12-02 LAB — CBC
HCT: 33.2 % — ABNORMAL LOW (ref 39.0–52.0)
Hemoglobin: 10.5 g/dL — ABNORMAL LOW (ref 13.0–17.0)
MCH: 29.2 pg (ref 26.0–34.0)
MCHC: 31.6 g/dL (ref 30.0–36.0)
MCV: 92.2 fL (ref 80.0–100.0)
Platelets: 339 10*3/uL (ref 150–400)
RBC: 3.6 MIL/uL — ABNORMAL LOW (ref 4.22–5.81)
RDW: 13.4 % (ref 11.5–15.5)
WBC: 16.7 10*3/uL — ABNORMAL HIGH (ref 4.0–10.5)
nRBC: 0 % (ref 0.0–0.2)

## 2022-12-02 LAB — MAGNESIUM: Magnesium: 2.4 mg/dL (ref 1.7–2.4)

## 2022-12-02 MED ORDER — TRAVASOL 10 % IV SOLN
INTRAVENOUS | Status: AC
  Filled 2022-12-02: qty 1440

## 2022-12-02 MED ORDER — BACITRACIN ZINC 500 UNIT/GM EX OINT
TOPICAL_OINTMENT | Freq: Two times a day (BID) | CUTANEOUS | Status: DC
  Filled 2022-12-02: qty 28.4
  Filled 2022-12-02 (×5): qty 28.35

## 2022-12-02 NOTE — Progress Notes (Signed)
NAME:  Kyle Burns, MRN:  [id number], DOB:  [birth year], LOS: 10 ADMISSION DATE:  11/22/2022, CONSULTATION DATE:  12/02/2022  REFERRING MD:  Sherryll Burger TRH, CHIEF COMPLAINT: Respiratory failure  History of Present Illness:  69  yobm never smoker  with OSA/OHS (Bipap per Craige Cotta) and paralzyed L HD with mild intermittent asthma admitted for small bowel obstruction due to incarcerated umbilical hernia.  He underwent open umbilical hernia repair  12/9 with mesh placement.  Postop day 3 he developed abdominal distention and atrial fibrillation/RVR.  NG tube was placed and 3 L of fluid drained.   He required IV Cardizem and then amiodarone bolus and drip also digoxin 0.25 mg IV.  He developed dyspnea and was started on IV Zosyn due to concern for aspiration. 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 12/13 ETT >> 12/17 back to OR for  Evacuation of postoperative abdominal wall hematoma     SUBJ  Remains critically ill. Intubated and sedated propofol and low-dose fentanyl Low-grade febrile NG output 2.1 L. 1.3 L urine output  Objective   Blood pressure 117/77, pulse 84, temperature 98.3 F (36.8 C), temperature source Axillary, resp. rate (!) 24, height 5\' 11"  (1.803 m), weight 123.5 kg, SpO2 96 %.    Vent Mode: PRVC FiO2 (%):  [40 %] 40 % Set Rate:  [20 bmp] 20 bmp Vt Set:  [550 mL] 550 mL PEEP:  [5 cmH20] 5  cmH20 Pressure Support:  [12 cmH20] 12 cmH20 Plateau Pressure:  [18 cmH20-29 cmH20] 21 cmH20   Intake/Output Summary (Last 24 hours) at 12/02/2022 1402 Last data filed at 12/02/2022 0601 Gross per 24 hour  Intake 2782.81 ml  Output 2250 ml  Net 532.81 ml    Filed Weights   11/30/22 0610 12/01/22 0500 12/02/22 0500  Weight: 127.7 kg 124.6 kg 123.5 kg     Examination:  General appearance:   late middle aged appearing obese bm, intubated sedated on vent   No jvd Oropharynx ett mild pallor, no JVD Neck supple Lungs -decreased breath sound on left, copious white secretions RRR no s3 or or sign murmur Abd mod distended with very limited excursion , blisters BL flank Extr warm with no edema or clubbing noted/ PAS in place Neuro RASS -1, follows one-step commands,  no apparent motor deficits    Labs show normal electrolytes, slight increase in creatinine 1.4, stable mildly elevated LFTs, mild leukocytosis, stable anemia  Chest x-ray independently reviewed shows left lower lobe infiltrate/elevated left diaphragm  Resolved Hospital Problem list     Assessment & Plan:  Acute respiratory failure with hypoxia , related to abdominal distention Underlying OSA/OHS Known left hemidiaphragm paralysis  Asthma with air trapping/ wheezing noted am 12/18   >>> Continue spontaneous breathing trials but will hold off extubation until bowel distention improves / NG to suction not required and secretions decreased because likely he will need BiPAP immediately on extubation  - continue  bronchodilators / avoid steroids in this setting except per ET > added budesonide 12/18   HCAP / serratia kluyvera Left lower lobe infiltrate is likely related to HAP or elevated left hemidiaphragm rather than effusion  - Zosyn started 12/14>>> -chest PT   Circulatory shock -Related to abdominal issues and sedation medications/propofol/ possible HCAP >>> continue off pressors and keep up with GI losses     AKI  Lab Results  Component Value Date   CREATININE 1.41 (H) 12/02/2022   CREATININE 1.26 (H) 12/01/2022   CREATININE 1.16 11/30/2022   -related to above  Trend BMET and urine output  Atrial fibrillation/RVR -converted to sinus rhythm -Related to respiratory worsening -Echo shows normal LVEF, hyperdynamic, grade 1 diastolic dysfunction    Post op ileus/incarcerated hernia repair Paraumbilical hematoma -drained by surgery  -Surgery following, NG tube to LIS >>> trial of reglan 12/18 as critical we get gut working to get NG out prior to extubation     Protein calorie malnutrition , mild -On TNA  Best Practice (right click and "Reselect all SmartList Selections" daily)   Diet/type: NPO DVT prophylaxis: Lovenox  GI prophylaxis: PPI Lines: N/A Foley:  Yes, and it is still needed Code Status:  full code Last date of multidisciplinary goals of care discussion [ 12/19 mother at bedside] requests transfer to [facility]   CBC: Recent Labs  Lab 11/26/22 0925 11/27/22 0432 11/28/22 0432 11/29/22 0418 11/30/22 0508 12/01/22 0357 12/02/22 0330  WBC 14.6*   < > 12.5* 12.6* 14.6* 17.7* 16.7*  NEUTROABS 10.3*  --   --   --   --   --   --   HGB 14.8   < > 10.7* 11.3* 10.5* 10.8* 10.5*  HCT 44.0   < > 32.5* 34.6* 32.5* 33.8* 33.2*  MCV 86.3   < > 87.1 88.7 89.5 90.6 92.2  PLT 409*   < > 326 310 331 356 339   < > = values in this interval not displayed.     Basic Metabolic Panel: Recent Labs  Lab 11/28/22 0432 11/29/22 0418 11/30/22 0508 12/01/22 0357 12/02/22 0330  NA 135 137 138 139 139  K 3.2* 3.6 4.3 4.5 4.6  CL 100 103 107 108 108  CO2 27 26 23 22 23   GLUCOSE 125* 111* 140* 133* 119*  BUN 57* 53* 39* 35* 40*  CREATININE 1.73* 1.61* 1.16 1.26* 1.41*  CALCIUM 7.5* 7.9* 8.5* 8.7* 9.1  MG 3.6* 3.8* 3.1* 2.6* 2.4  PHOS 2.8 3.6 3.2 3.3  --     GFR: Estimated Creatinine Clearance: 76.4 mL/min (A) (by C-G formula based on SCr of 1.41 mg/dL (H)). Recent Labs   Lab 11/29/22 0418 11/30/22 0508 12/01/22 0357 12/02/22 0330  WBC 12.6* 14.6* 17.7* 16.7*     Liver Function Tests: Recent Labs  Lab 11/29/22 0418 11/30/22 0508 12/01/22 0357 12/02/22 0330  AST 60* 95* 60* 66*  ALT 58* 105* 92* 101*  ALKPHOS 76 89 100 121  BILITOT 0.6 0.4 0.6 0.5  PROT 6.2* 6.7 7.0 7.1  ALBUMIN 2.7* 2.9* 3.0* 3.0*    No results for input(s): "LIPASE", "AMYLASE" in the last 168 hours. No results for input(s): "AMMONIA" in the last 168 hours.  ABG    Component Value Date/Time   PHART 7.45 11/26/2022 0859   PCO2ART 43 11/26/2022 0859   PO2ART 103 11/26/2022 0859   HCO3 29.9 (H) 11/26/2022 0859   TCO2 28 11/22/2022 0635   O2SAT 97.6 11/26/2022 0859  Coagulation Profile: No results for input(s): "INR", "PROTIME" in the last 168 hours.  Cardiac Enzymes: No results for input(s): "CKTOTAL", "CKMB", "CKMBINDEX", "TROPONINI" in the last 168 hours.  HbA1C: HB A1C (BAYER DCA - WAIVED)  Date/Time Value Ref Range Status  11/04/2021 04:18 PM 5.7 (H) 4.8 - 5.6 % Final    Comment:             Prediabetes: 5.7 - 6.4          Diabetes: >6.4          Glycemic control for adults with diabetes: <7.0     CBG: Recent Labs  Lab 12/01/22 1920 12/01/22 2320 12/02/22 0507 12/02/22 0730 12/02/22 1156  GLUCAP 121* 123* 127* 109* 108*        The patient is critically ill with multiple organ systems failure and requires high complexity decision making for assessment and support, frequent evaluation and titration of therapies, application of advanced monitoring technologies and extensive interpretation of multiple databases. Critical Care Time devoted to patient care services described in this note is 35 minutes.   Sandrea Hughs, MD Pulmonary and Critical Care Medicine [facility] [phone]   After 7:00 pm call Elink  [phone]   .

## 2022-12-02 NOTE — Progress Notes (Signed)
  Patient has open and closed blisters on right and left lateral aspect of his abdomen from the dressing tape he had from the evacuation of hematoma. Dr. Robyne Peers present at bedside. Bacitracin ointment ordered and applied over the incision site and the open blisters, abdominal pad placed over without the tape. Will continue to monitor.

## 2022-12-02 NOTE — Progress Notes (Signed)
PROGRESS NOTE    AANAY Burns  [id number] DOB: 1966-03-21 DOA: 11/22/2022 PCP: Janora Norlander, DO   Brief Narrative:    Kyle Burns  is a 56 y.o. male, with past medical history of hypertension, asthma, GERD, hyperlipidemia, O SA/OHS, BiPAP dependent with oxygen at nighttime. -Patient presents to ED secondary to complaints of abdominal pain, developed yesterday, started having generalized abdominal pain, nausea, after eating [place] food, he denies any vomiting, he does report dry heaving as well, he denies any fever, chills, no dysuria or polyuria, he denies any obstipation or constipation, no diarrhea, no bright red blood per rectum, no chest pain, orts pain has worsened overnight which prompted him to come to ED -Patient underwent open umbilical hernia repair with placement of mesh on 12/9.  He is noted to have worsening abdominal distention suspected to be related to ileus and now has worsening respiratory distress and hypoxemia requiring intubation on 12/13.  He also developed atrial fibrillation with RVR and has remained in sinus rhythm and therefore amiodarone infusion has been discontinued.  Ileus appears to be slowly improving with increase in bowel movements and less NG tube output noted.  TPN initiated 12/15.  Continues to have persistent ileus in the setting of large umbilical hematoma noted on CT abdomen 12/16.  He underwent evacuation of postoperative abdominal wall hematoma on 12/17 and family is now requesting transfer to ICU bed at [facility].  He is currently awaiting transfer to ICU.  Assessment & Plan:   Principal Problem:   Incarcerated umbilical hernia Active Problems:   Hyperlipidemia   Obstructive sleep apnea   Gastroesophageal reflux disease   BMI 40.0-44.9, adult (HCC)   Mild intermittent asthma   Nodule of right lung   Acute respiratory failure with hypoxia (HCC)   Atrial fibrillation with rapid ventricular response (HCC)   Small bowel obstruction  (HCC)   Septic shock (HCC)   AKI (acute kidney injury) ([place])  Assessment and Plan:  Acute hypoxemic respiratory failure secondary to worsening abdominal distention -Underlying OSA/OHS with known left hemidiaphragm paralysis -Intubated 12/13 -Appreciate ongoing PCCM evaluation, holding off on extubation until bowel distention improves -Plan to continue intubation through today and transfer to ICU service at [facility] per family request.  Awaiting transfer.   Septic shock with fever-resolved -Hypotension related to sedation and worsening abdominal distention as well as concern for aspiration pneumonia -Levophed has been weaned off -Continue IV Zosyn for possible aspiration coverage, will not de-escalate at this time and continue on coverage day 6/7 -Chest x-ray appears to look okay 12/16, blood cultures ordered -Continue to monitor a.m. labs as well as lactic acid -CT abdomen and pelvis 12/16 with large hematoma and ongoing ileus, general surgery planning for intra-abdominal washout   Paroxysmal atrial fibrillation/RVR-now in sinus rhythm -Now in sinus rhythm, related to respiratory condition -2D echocardiogram with normal LVEF and grade 1 diastolic dysfunction -Amiodarone discontinued -Appreciate cardiology evaluation -No need for anticoagulation at this point   AKI-resolved -Continue IV fluid with TPN ongoing -Monitor urine output -Avoid nephrotoxic agents -A.m. labs   Incarcerated umbilical hernia/SBO s/p open umbilical hernia repair with mesh 12/9 now with ileus -Appears to slowly be improving -Continue n.p.o. with NG tube to LIS -Possible aspiration with recurrent vomiting and worsening abdominal distention -May require repeat imaging especially with potential worsening hematoma -Potassium goal of 4.0 and magnesium 2.0 -Continue to restrict fentanyl drip as much as possible -Remains on TPN since 12/15 -CT abdomen pelvis with large hematoma and patient  has undergone evacuation  of postoperative abdominal wall hematoma on 12/17 -KUB on 12/18 with improving abdominal wall distention and nonobstructive bowel gas pattern   History of hypertension -Currently hypotensive and on pressors, holding home medications   Obstructive sleep apnea/obesity hypoventilation syndrome -Currently intubated -Plan to extubate to BiPAP once ileus improves   Dyslipidemia -Statin on discharge   GERD -PPI   History of asthma -Appears to be mild and intermittent -Continue as needed bronchodilator management. -Continue the use of Singulair. -No wheezing on exam.   Class III obesity -Body mass index is 39.46 kg/m. -Low-calorie diet, portion control and increase physical activity discussed with patient.   Incidental 4 mm right lung nodule -Outpatient follow-up with pulmonary service recommended.      DVT prophylaxis:Lovenox Code Status: Full Family Communication: None at bedside; Mother called by GS 12/18 who is requesting transfer Disposition Plan:  Status is: Inpatient Remains inpatient appropriate because: Need for IV medications   Consultants:  Gen Surg Cardiology s/o Pulm   Procedures:  Open umbilical hernia repair with mesh Intubation 12/13 Evacuation of postoperative abdominal wall hematoma 12/17   Antimicrobials:  Anti-infectives (From admission, onward)    Start     Dose/Rate Route Frequency Ordered Stop   11/27/22 1600  piperacillin-tazobactam (ZOSYN) IVPB 3.375 g        3.375 g 12.5 mL/hr over 240 Minutes Intravenous Every 8 hours 11/27/22 1525     11/22/22 1400  ceFAZolin (ANCEF) IVPB 3g/100 mL premix        3 g 200 mL/hr over 30 Minutes Intravenous  Once 11/22/22 1202 11/22/22 1245   11/22/22 1208  ceFAZolin (ANCEF) 3-0.9 GM/100ML-% IVPB       Note to Pharmacy: Elvina Sidle S: cabinet override      11/22/22 1208 11/22/22 1310   11/22/22 1200  ceFAZolin (ANCEF) IVPB 2g/100 mL premix  Status:  Discontinued        2 g 200 mL/hr over 30 Minutes  Intravenous Every 8 hours 11/22/22 1149 11/22/22 1202      Subjective: Patient seen and evaluated today with no acute overnight events noted.  He remains intubated and sedated in the ICU and is arousable and able to answer some questions.  No abdominal pain noted and is passing some flatus.  2 L output per NG tube over the last 24 hours.  Objective: Vitals:   12/02/22 1158 12/02/22 1200 12/02/22 1243 12/02/22 1340  BP:  117/77    Pulse:  84    Resp:  (!) 24    Temp: 100.3 F (37.9 C)   98.3 F (36.8 C)  TempSrc: Axillary   Axillary  SpO2:  97% 96%   Weight:      Height:        Intake/Output Summary (Last 24 hours) at 12/02/2022 1426 Last data filed at 12/02/2022 0601 Gross per 24 hour  Intake 2782.81 ml  Output 2250 ml  Net 532.81 ml   Filed Weights   11/30/22 0610 12/01/22 0500 12/02/22 0500  Weight: 127.7 kg 124.6 kg 123.5 kg    Examination:  General exam: Appears sedated, but arousable; intubated Respiratory system: Clear to auscultation. Respiratory effort normal.  Intubated, FiO2 35% Cardiovascular system: S1 & S2 heard, RRR.  Gastrointestinal system: Abdomen is distended with dressing C/D/I.  NG tube output Central nervous system: Sedated Extremities: No edema Skin: No significant lesions noted Psychiatry: Flat affect.    Data Reviewed: I have personally reviewed following labs and imaging studies  CBC:  Recent Labs  Lab 11/26/22 0925 11/27/22 0432 11/28/22 0432 11/29/22 0418 11/30/22 0508 12/01/22 0357 12/02/22 0330  WBC 14.6*   < > 12.5* 12.6* 14.6* 17.7* 16.7*  NEUTROABS 10.3*  --   --   --   --   --   --   HGB 14.8   < > 10.7* 11.3* 10.5* 10.8* 10.5*  HCT 44.0   < > 32.5* 34.6* 32.5* 33.8* 33.2*  MCV 86.3   < > 87.1 88.7 89.5 90.6 92.2  PLT 409*   < > 326 310 331 356 339   < > = values in this interval not displayed.   Basic Metabolic Panel: Recent Labs  Lab 11/28/22 0432 11/29/22 0418 11/30/22 0508 12/01/22 0357 12/02/22 0330  NA  135 137 138 139 139  K 3.2* 3.6 4.3 4.5 4.6  CL 100 103 107 108 108  CO2 27 26 23 22 23   GLUCOSE 125* 111* 140* 133* 119*  BUN 57* 53* 39* 35* 40*  CREATININE 1.73* 1.61* 1.16 1.26* 1.41*  CALCIUM 7.5* 7.9* 8.5* 8.7* 9.1  MG 3.6* 3.8* 3.1* 2.6* 2.4  PHOS 2.8 3.6 3.2 3.3  --    GFR: Estimated Creatinine Clearance: 76.4 mL/min (A) (by C-G formula based on SCr of 1.41 mg/dL (H)). Liver Function Tests: Recent Labs  Lab 11/29/22 0418 11/30/22 0508 12/01/22 0357 12/02/22 0330  AST 60* 95* 60* 66*  ALT 58* 105* 92* 101*  ALKPHOS 76 89 100 121  BILITOT 0.6 0.4 0.6 0.5  PROT 6.2* 6.7 7.0 7.1  ALBUMIN 2.7* 2.9* 3.0* 3.0*   No results for input(s): "LIPASE", "AMYLASE" in the last 168 hours. No results for input(s): "AMMONIA" in the last 168 hours. Coagulation Profile: No results for input(s): "INR", "PROTIME" in the last 168 hours. Cardiac Enzymes: No results for input(s): "CKTOTAL", "CKMB", "CKMBINDEX", "TROPONINI" in the last 168 hours. BNP (last 3 results) No results for input(s): "PROBNP" in the last 8760 hours. HbA1C: No results for input(s): "HGBA1C" in the last 72 hours. CBG: Recent Labs  Lab 12/01/22 1920 12/01/22 2320 12/02/22 0507 12/02/22 0730 12/02/22 1156  GLUCAP 121* 123* 127* 109* 108*   Lipid Profile: Recent Labs    11/30/22 0508 12/01/22 0357  TRIG 206* 172*   Thyroid Function Tests: No results for input(s): "TSH", "T4TOTAL", "FREET4", "T3FREE", "THYROIDAB" in the last 72 hours. Anemia Panel: No results for input(s): "VITAMINB12", "FOLATE", "FERRITIN", "TIBC", "IRON", "RETICCTPCT" in the last 72 hours. Sepsis Labs: No results for input(s): "PROCALCITON", "LATICACIDVEN" in the last 168 hours.  Recent Results (from the past 240 hour(s))  MRSA Next Gen by PCR, Nasal     Status: None   Collection Time: 11/22/22  5:25 PM   Specimen: Nasal Mucosa; Nasal Swab  Result Value Ref Range Status   MRSA by PCR Next Gen NOT DETECTED NOT DETECTED Final     Comment: (NOTE) The GeneXpert MRSA Assay (FDA approved for NASAL specimens only), is one component of a comprehensive MRSA colonization surveillance program. It is not intended to diagnose MRSA infection nor to guide or monitor treatment for MRSA infections. Test performance is not FDA approved in patients less than 58 years old. Performed at [facility], [address], [place],  [address]   Culture, Respiratory w Gram Stain     Status: None   Collection Time: 11/26/22  3:58 PM   Specimen: Tracheal Aspirate; Respiratory  Result Value Ref Range Status   Specimen Description   Final  TRACHEAL ASPIRATE Performed at [facility], [address] [place], [place] [address]    Special Requests   Final    NONE Performed at [facility], [address] [place], [place] [address]    Gram Stain   Final    RARE WBC PRESENT, PREDOMINANTLY PMN MODERATE GRAM NEGATIVE RODS    Culture   Final    ABUNDANT SERRATIA MARCESCENS FEW KLUYVERA ASCORBATA NO STAPHYLOCOCCUS AUREUS ISOLATED No Pseudomonas species isolated Performed at [place] [address] [place], [place] [address]    Report Status 11/30/2022 FINAL  Final   Organism ID, Bacteria SERRATIA MARCESCENS  Final   Organism ID, Bacteria KLUYVERA ASCORBATA  Final      Susceptibility   Kluyvera ascorbata - MIC*    AMPICILLIN 16 INTERMEDIATE Intermediate     CEFAZOLIN >=64 RESISTANT Resistant     CEFEPIME <=0.12 SENSITIVE Sensitive     CEFTAZIDIME <=1 SENSITIVE Sensitive     CEFTRIAXONE 8 RESISTANT Resistant     CIPROFLOXACIN <=0.25 SENSITIVE Sensitive     GENTAMICIN <=1 SENSITIVE Sensitive     IMIPENEM <=0.25 SENSITIVE Sensitive     TRIMETH/SULFA <=20 SENSITIVE Sensitive     AMPICILLIN/SULBACTAM <=2 SENSITIVE Sensitive     PIP/TAZO <=4 SENSITIVE Sensitive     * FEW KLUYVERA ASCORBATA   Serratia marcescens - MIC*    CEFAZOLIN >=64 RESISTANT Resistant     CEFEPIME <=0.12 SENSITIVE Sensitive     CEFTAZIDIME <=1  SENSITIVE Sensitive     CEFTRIAXONE <=0.25 SENSITIVE Sensitive     CIPROFLOXACIN <=0.25 SENSITIVE Sensitive     GENTAMICIN <=1 SENSITIVE Sensitive     TRIMETH/SULFA <=20 SENSITIVE Sensitive     * ABUNDANT SERRATIA MARCESCENS  Culture, blood (Routine X 2) w Reflex to ID Panel     Status: None (Preliminary result)   Collection Time: 11/29/22  8:29 AM   Specimen: BLOOD LEFT HAND  Result Value Ref Range Status   Specimen Description   Final    BLOOD LEFT HAND BOTTLES DRAWN AEROBIC AND ANAEROBIC   Special Requests   Final    Blood Culture results may not be optimal due to an excessive volume of blood received in culture bottles   Culture   Final    NO GROWTH 2 DAYS Performed at [facility], [address] [place], [place] [address]    Report Status PENDING  Incomplete  Culture, blood (Routine X 2) w Reflex to ID Panel     Status: None (Preliminary result)   Collection Time: 11/29/22  8:29 AM   Specimen: BLOOD LEFT WRIST  Result Value Ref Range Status   Specimen Description BLOOD LEFT WRIST AEROBIC BOTTLE ONLY  Final   Special Requests Blood Culture adequate volume  Final   Culture   Final    NO GROWTH 2 DAYS Performed at [facility], [address], [place], [place] [address]    Report Status PENDING  Incomplete         Radiology Studies: DG Chest Port 1 View  Result Date: 12/02/2022 CLINICAL DATA:  Acute respiratory failure. Status post surgery for small bowel obstruction due to incarcerated umbilical hernia. Intubated on 11/26/2022. History of paralyzed left hemidiaphragm. EXAM: PORTABLE CHEST 1 VIEW COMPARISON:  AP chest 11/29/2022, 11/28/2022, 02/08/2015 FINDINGS: Endotracheal tube tip terminates approximately 3.5 cm above the carina. Right upper extremity PICC tip is visualized to the superior vena cava/right atrial junction, although the tip is again not well delineated likely due to  motion artifact. Enteric tube descends below the diaphragm enters the stomach within left  upper quadrant with the tip excluded by inferior collimation. There is moderate elevation of the left hemidiaphragm, unchanged from multiple prior radiographs including 02/08/2015. Bibasilar bronchovascular crowding. Unchanged possible small left pleural effusion. No pneumothorax. No acute skeletal abnormality. IMPRESSION: 1. Endotracheal tube tip terminates approximately 3.5 cm above the carina. 2. Low lung volumes with chronic elevation of the left hemidiaphragm and bibasilar subsegmental atelectasis. It is again difficult to exclude a small left pleural effusion. Electronically Signed   By: Yvonne Kendall M.D.   On: 12/02/2022 08:27   DG Abd 2 Views  Result Date: 12/01/2022 CLINICAL DATA:  Postoperative ileus EXAM: ABDOMEN - 2 VIEW COMPARISON:  Portable exam at 1113 hrs compared to 11/27/2022 FINDINGS: Nasogastric tube projects over stomach. Nonobstructive bowel gas with air-filled nondistended loops of bowel throughout the abdomen. Small amount of stool in RIGHT colon. Decreased bowel distension since previous exam. No definite bowel dilatation/obstruction. Elevation of LEFT diaphragm. IMPRESSION: Nonobstructive bowel gas pattern with decreased bowel distension since previous study. Electronically Signed   By: Lavonia Dana M.D.   On: 12/01/2022 11:27        Scheduled Meds:  bacitracin   Topical BID   bisacodyl  10 mg Rectal BID   budesonide (PULMICORT) nebulizer solution  0.5 mg Nebulization BID   Chlorhexidine Gluconate Cloth  6 each Topical Daily   docusate  100 mg Per Tube BID   enoxaparin (LOVENOX) injection  60 mg Subcutaneous Q24H   fluticasone  1 spray Each Nare Daily   insulin aspart  0-20 Units Subcutaneous Q6H   ipratropium  0.5 mg Nebulization Q6H   levalbuterol  0.63 mg Nebulization Q6H   metoCLOPramide (REGLAN) injection  10 mg Intravenous Q6H   montelukast  10 mg Per Tube QHS    morphine injection  2 mg Intravenous Once   mouth rinse  15 mL Mouth Rinse Q2H   pantoprazole  (PROTONIX) IV  40 mg Intravenous Q12H   polyethylene glycol  17 g Oral Daily   sodium chloride flush  10-40 mL Intracatheter Q12H   Continuous Infusions:  sodium chloride 250 mL (12/02/22 1028)   fentaNYL infusion INTRAVENOUS 25 mcg/hr (12/01/22 1841)   piperacillin-tazobactam (ZOSYN)  IV 3.375 g (12/02/22 1357)   propofol (DIPRIVAN) infusion 25 mcg/kg/min (12/02/22 1041)   TPN ADULT (ION) 100 mL/hr at 12/01/22 1841   TPN ADULT (ION)       LOS: 10 days    Time spent: 35 minutes    Kullen Tomasetti Darleen Crocker, DO [facility]  If 7PM-7AM, please contact night-coverage [email or web address] 12/02/2022, 2:26 PM

## 2022-12-02 NOTE — Plan of Care (Signed)

## 2022-12-02 NOTE — Progress Notes (Signed)
[facility] Progress Note  2 Days Post-Op  Subjective: Patient seen and examined.  He is intubated and sedated in the ICU.  He is a little more awake today, and he is able to answer some questions.  He denies abdominal pain and confirms passing flatus.  Per nurse, he had a small bowel movement this AM and yesterday evening.  His NG tube remains in place with 2L of bilious output in the last 24 hours.    Objective: Vital signs in last 24 hours: Temp:  [98.8 F (37.1 C)-101.1 F (38.4 C)] 100.3 F (37.9 C) (12/19 1158) Pulse Rate:  [78-105] 84 (12/19 1200) Resp:  [17-27] 24 (12/19 1200) BP: (85-138)/(60-103) 117/77 (12/19 1200) SpO2:  [93 %-98 %] 96 % (12/19 1243) FiO2 (%):  [40 %] 40 % (12/19 1243) Weight:  [123.5 kg] 123.5 kg (12/19 0500) Last BM Date : 12/01/22  Intake/Output from previous day: 12/18 0701 - 12/19 0700 In: 3473.7 [I.V.:3322.5; IV Piggyback:151.2] Out: 3450 [Urine:1300; Emesis/NG output:2150] Intake/Output this shift: No intake/output data recorded.  General appearance: Intubated and sedated in ICU Nose: NG tube in place with bilious output in canister GI: Abdomen soft, mild distension, non tender to palpation; no rigidity or guarding; Umbilical surgical site with ecchymosis, slightly improved from Sunday, no evidence of reaccumulation of hematoma/seroma; skin staples in place, new blisters at lateral aspects of abdomen at location of previous tape  Lab Results:  Recent Labs    12/01/22 0357 12/02/22 0330  WBC 17.7* 16.7*  HGB 10.8* 10.5*  HCT 33.8* 33.2*  PLT 356 339   BMET Recent Labs    12/01/22 0357 12/02/22 0330  NA 139 139  K 4.5 4.6  CL 108 108  CO2 22 23  GLUCOSE 133* 119*  BUN 35* 40*  CREATININE 1.26* 1.41*  CALCIUM 8.7* 9.1   PT/INR No results for input(s): "LABPROT", "INR" in the last 72 hours.  Studies/Results: DG Chest Port 1 View  Result Date: 12/02/2022 CLINICAL DATA:  Acute respiratory failure. Status  post surgery for small bowel obstruction due to incarcerated umbilical hernia. Intubated on 11/26/2022. History of paralyzed left hemidiaphragm. EXAM: PORTABLE CHEST 1 VIEW COMPARISON:  AP chest 11/29/2022, 11/28/2022, 02/08/2015 FINDINGS: Endotracheal tube tip terminates approximately 3.5 cm above the carina. Right upper extremity PICC tip is visualized to the superior vena cava/right atrial junction, although the tip is again not well delineated likely due to motion artifact. Enteric tube descends below the diaphragm enters the stomach within left upper quadrant with the tip excluded by inferior collimation. There is moderate elevation of the left hemidiaphragm, unchanged from multiple prior radiographs including 02/08/2015. Bibasilar bronchovascular crowding. Unchanged possible small left pleural effusion. No pneumothorax. No acute skeletal abnormality. IMPRESSION: 1. Endotracheal tube tip terminates approximately 3.5 cm above the carina. 2. Low lung volumes with chronic elevation of the left hemidiaphragm and bibasilar subsegmental atelectasis. It is again difficult to exclude a small left pleural effusion. Electronically Signed   By: Neita Garnet M.D.   On: 12/02/2022 08:27   DG Abd 2 Views  Result Date: 12/01/2022 CLINICAL DATA:  Postoperative ileus EXAM: ABDOMEN - 2 VIEW COMPARISON:  Portable exam at 1113 hrs compared to 11/27/2022 FINDINGS: Nasogastric tube projects over stomach. Nonobstructive bowel gas with air-filled nondistended loops of bowel throughout the abdomen. Small amount of stool in RIGHT colon. Decreased bowel distension since previous exam. No definite bowel dilatation/obstruction. Elevation of LEFT diaphragm. IMPRESSION: Nonobstructive bowel gas pattern with decreased bowel distension since previous  study. Electronically Signed   By: Lavonia Dana M.D.   On: 12/01/2022 11:27    Anti-infectives: Anti-infectives (From admission, onward)    Start     Dose/Rate Route Frequency Ordered  Stop   11/27/22 1600  piperacillin-tazobactam (ZOSYN) IVPB 3.375 g        3.375 g 12.5 mL/hr over 240 Minutes Intravenous Every 8 hours 11/27/22 1525     11/22/22 1400  ceFAZolin (ANCEF) IVPB 3g/100 mL premix        3 g 200 mL/hr over 30 Minutes Intravenous  Once 11/22/22 1202 11/22/22 1245   11/22/22 1208  ceFAZolin (ANCEF) 3-0.9 GM/100ML-% IVPB       Note to Pharmacy: Lear Ng S: cabinet override      11/22/22 1208 11/22/22 1310   11/22/22 1200  ceFAZolin (ANCEF) IVPB 2g/100 mL premix  Status:  Discontinued        2 g 200 mL/hr over 30 Minutes Intravenous Every 8 hours 11/22/22 1149 11/22/22 1202       Assessment/Plan:  Patient is a 56 year old male who is s/p incarcerated umbilical hernia repair with mesh 12/09. Had post operative ileus and required NG and then required intubation for low lung volumes and possible PNA.    -CT abdomen and pelvis on 12/16 demonstrating hematoma with likely ileus -Patient s/p abdominal wall hematoma evacuation on 12/18 with no evidence of hernia recurrence -KUB yesterday with improving bowel distention -WBC slightly decreased today, 16.7 from 17.7 -Antibiotics per primary team -Maintain NG to LIS -Miralax and Reglan ordered -Once patient has more regular bowel function, will trial trickle tube feeds to see how he is progressing -Continue TPN -Antibiotic ointment ordered to be applied to skin staples; Can also apply antibiotic ointment to open areas of new blisters on abdomen.  -Avoid application of tape to prevent progression or new blisters -Continue to wean ventilator as able.  Patient still only slightly tolerates SBTs for 30 minutes -Transfer to [facility] ICU initiated yesterday, awaiting bed. [facility] is aware of the patient and will plan to see him upon transfer   LOS: 10 days    [place] 12/02/2022

## 2022-12-02 NOTE — Progress Notes (Signed)
PHARMACY - TOTAL PARENTERAL NUTRITION CONSULT NOTE   Indication: Prolonged ileus  Patient Measurements: Height: 5\' 11"  (180.3 cm) Weight: 123.5 kg (272 lb 4.3 oz) IBW/kg (Calculated) : 75.3 TPN AdjBW (KG): 88.9 Body mass index is 37.97 kg/m.  Assessment: Patient presents to ED secondary to complaints of abdominal pain. Patient underwent open umbilical hernia repair with placement of mesh on 12/9. noted to have worsening abdominal distention suspected to be related to ileus and now has worsening respiratory distress and hypoxemia requiring intubation on 12/13.  He also developed atrial fibrillation with RVR . Slow to improve and TPN started 12/15. Remains intubated and difficult to wean. KUB showing decreased bowel distention.  Glucose / Insulin: 109 - 123. 9 units/24 hours Electrolytes:  Mag 3.2> 2.6> 2.4 K 4.3> 4.6, will reduce amount  Renal: WNL Scr 1.26> 1.41, track and trend Hepatic: TG 172 Intake / Output; 1061/ 1700 GI Imaging: GI Surgeries / Procedures:   Central access: PICC TPN start date: 12/15  Nutritional Goals: Goal TPN rate is 100 mL/hr (provides 144 g of protein(576 kcal) and CHO 1142 kcals per day) total of 1718kcal from TPN Patient currently on propofol @ 19 mL/hr ==> ~501 kcal/day   RD Assessment: Estimated Needs Total Energy Estimated Needs: 2400 Total Protein Estimated Needs: 150-160 gr Total Fluid Estimated Needs: >2 liters daily  Current Nutrition:  NPO  Plan:  Continue TPN at 100 mL/hr at 1800- no lipids in TPN since patient is receiving  propofol. Possible extubation soon so will monitor. Electrolytes in TPN: Na 78mEq/L, K 47mEq/L, Ca 40mEq/L, Mg 34mEq/L, and Phos 34mmol/L. Cl:Ac 1:1 Add standard MVI and trace elements to TPN Continue Resistant q6h SSI and adjust as needed  CMP in AM Monitor TPN labs on Mon/Thurs,   9m, BS Pharm D, BCPS Clinical Pharmacist 12/02/2022 11:20 AM

## 2022-12-03 ENCOUNTER — Inpatient Hospital Stay (HOSPITAL_COMMUNITY): Payer: No Typology Code available for payment source

## 2022-12-03 LAB — COMPREHENSIVE METABOLIC PANEL
ALT: 103 U/L — ABNORMAL HIGH (ref 0–44)
AST: 65 U/L — ABNORMAL HIGH (ref 15–41)
Albumin: 2.7 g/dL — ABNORMAL LOW (ref 3.5–5.0)
Alkaline Phosphatase: 114 U/L (ref 38–126)
Anion gap: 7 (ref 5–15)
BUN: 34 mg/dL — ABNORMAL HIGH (ref 6–20)
CO2: 23 mmol/L (ref 22–32)
Calcium: 9.1 mg/dL (ref 8.9–10.3)
Chloride: 110 mmol/L (ref 98–111)
Creatinine, Ser: 1.32 mg/dL — ABNORMAL HIGH (ref 0.61–1.24)
GFR, Estimated: >60 mL/min (ref >60)
Glucose, Bld: 129 mg/dL — ABNORMAL HIGH (ref 70–99)
Potassium: 4.6 mmol/L (ref 3.5–5.1)
Sodium: 140 mmol/L (ref 135–145)
Total Bilirubin: 0.5 mg/dL (ref 0.3–1.2)
Total Protein: 6.8 g/dL (ref 6.5–8.1)

## 2022-12-03 LAB — CBC
HCT: 31.4 % — ABNORMAL LOW (ref 39.0–52.0)
Hemoglobin: 9.3 g/dL — ABNORMAL LOW (ref 13.0–17.0)
MCH: 28.4 pg (ref 26.0–34.0)
MCHC: 29.6 g/dL — ABNORMAL LOW (ref 30.0–36.0)
MCV: 96 fL (ref 80.0–100.0)
Platelets: 322 10*3/uL (ref 150–400)
RBC: 3.27 MIL/uL — ABNORMAL LOW (ref 4.22–5.81)
RDW: 14.1 % (ref 11.5–15.5)
WBC: 18.7 10*3/uL — ABNORMAL HIGH (ref 4.0–10.5)
nRBC: 0 % (ref 0.0–0.2)

## 2022-12-03 LAB — GLUCOSE, CAPILLARY
Glucose-Capillary: 115 mg/dL — ABNORMAL HIGH (ref 70–99)
Glucose-Capillary: 123 mg/dL — ABNORMAL HIGH (ref 70–99)
Glucose-Capillary: 127 mg/dL — ABNORMAL HIGH (ref 70–99)
Glucose-Capillary: 135 mg/dL — ABNORMAL HIGH (ref 70–99)
Glucose-Capillary: 136 mg/dL — ABNORMAL HIGH (ref 70–99)
Glucose-Capillary: 138 mg/dL — ABNORMAL HIGH (ref 70–99)

## 2022-12-03 LAB — TRIGLYCERIDES: Triglycerides: 217 mg/dL — ABNORMAL HIGH (ref <150)

## 2022-12-03 LAB — MAGNESIUM: Magnesium: 2.3 mg/dL (ref 1.7–2.4)

## 2022-12-03 MED ORDER — TRAVASOL 10 % IV SOLN
INTRAVENOUS | Status: AC
  Filled 2022-12-03: qty 1680

## 2022-12-03 MED ORDER — POLYETHYLENE GLYCOL 3350 17 G PO PACK
17.0000 g | PACK | Freq: Every day | ORAL | Status: DC
  Administered 2022-12-03: 17 g
  Filled 2022-12-03 (×2): qty 1

## 2022-12-03 MED ORDER — ORAL CARE MOUTH RINSE: 15.0000 mL | OROMUCOSAL | Status: DC | PRN

## 2022-12-03 NOTE — Progress Notes (Signed)
Cross covering ICU physician.   Pt arrived sedated on vent minimal settings. 40% 5 peep. Abdomen quite distended with ng to ilws. No needs from nursing at this time.

## 2022-12-03 NOTE — Progress Notes (Addendum)
PHARMACY - TOTAL PARENTERAL NUTRITION CONSULT NOTE   Indication: Prolonged ileus  Patient Measurements: Height: 5\' 11"  (180.3 cm) Weight: 123.5 kg (272 lb 4.3 oz) IBW/kg (Calculated) : 75.3 TPN AdjBW (KG): 88.9 Body mass index is 37.97 kg/m.  Assessment: Patient presented to ED secondary to complaints of abdominal pain. Patient underwent open umbilical hernia repair with placement of mesh on 12/9 noted to have worsening abdominal distention suspected to be related to ileus and now has worsening respiratory distress and hypoxemia requiring intubation on 12/13.  He also developed atrial fibrillation with RVR . Slow to improve and TPN started 12/15. Remains intubated and difficult to wean.  Glucose / Insulin: No hx DM - CBG 107-138. 9u SSI/24 hrs Electrolytes:  K 4.6, Mg 2.3, CoCa 10.1, all others WNL Renal: Scr 1.32 (BL 1-1.2), BUN 34 (stable) Hepatic: Albumin 2.7, AST/ALT 65/103 (stable), TG 217 (on propofol), tbili 0.5 Intake / Output: NG 1400 mL, UOP 0.6 mL/kg/hr, LBM 12/19 GI Imaging: 12/16 CT abd: Paraumbilical hematoma, bowel distension, concern for ileus 12/18 Abd Xray: Nonobstructive bowel gas pattern w/decreased distension GI Surgeries / Procedures:  12/17: Evacuation of post-op abdominal wall hematoma  Central access: PICC TPN start date: 12/15  Nutritional Goals: Goal TPN rate is 100 mL/hr (provides 168 g of protein and CHO 1387 kcals per day) total of 2059 kcal from TPN - Lipids not included as patient is on propofol  RD Assessment: Estimated Needs Total Energy Estimated Needs: 2400 Total Protein Estimated Needs: 150-160 gr Total Fluid Estimated Needs: >2 liters daily  Current Nutrition:  NPO  Plan:  Continue TPN at 100 mL/hr at 1800. Will provide 100% of needs (168g AA and 2059 Kcal). Propofol running at 19 mL/hr (stable) supplying 502 kcal - will not add lipids to TPN Electrolytes in TPN: Na 56mEq/L, K 79mEq/L, Remove Ca 52mEq/L, Mg 85mEq/L, and Phos 53mmol/L.  Cl:Ac 1:1 Add standard MVI and trace elements to TPN Continue Resistant q6h SSI and adjust as needed  Monitor TPN labs on Mon/Thurs, and as needed  Addendum: Patient extubated and propofol discontinued. TPN already made prior to change. Will adjust TPN on 12/21 to account for lost kcals.  1/22, PharmD Clinical Pharmacist 12/03/2022 8:33 AM

## 2022-12-03 NOTE — Progress Notes (Signed)
Per [facility] PA okay to give meds per tube.

## 2022-12-03 NOTE — Progress Notes (Signed)
RT received AP hospital vent patient and placed on previous vent settings. Vital signs stable at this time.

## 2022-12-03 NOTE — Progress Notes (Signed)
NAME:  Kyle Burns, MRN:  [id number], DOB:  [birth year], LOS: 24 ADMISSION DATE:  11/22/2022, CONSULTATION DATE:  12/03/2022  REFERRING MD:  Manuella Ghazi TRH, CHIEF COMPLAINT: Respiratory failure  History of Present Illness:  87  yobm never smoker  with OSA/OHS (Bipap per Sood) and paralzyed L HD with mild intermittent asthma admitted for small bowel obstruction due to incarcerated umbilical hernia.  He underwent open umbilical hernia repair  12/9 with mesh placement.  Postop day 3 he developed abdominal distention and atrial fibrillation/RVR.  NG tube was placed and 3 L of fluid drained.   He required IV Cardizem and then amiodarone bolus and drip also digoxin 0.25 mg IV.  He developed dyspnea and was started on IV Zosyn due to concern for aspiration. 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 12/13 ETT >> 12/17 back to OR for  Evacuation of postoperative abdominal wall hematoma  12/20-weaning well  SUBJ Looks well No overnight events  Objective   Blood pressure 103/74, pulse 76, temperature 98.7 F (37.1 C), temperature source Axillary, resp. rate (!) 25, height 5\' 11"  (1.803 m), weight 123.5 kg, SpO2 100 %.    Vent Mode: CPAP;PSV FiO2 (%):  [40 %] 40 % Set Rate:  [20 bmp] 20 bmp Vt Set:  [550 mL] 550 mL PEEP:  [5 cmH20] 5 cmH20 Pressure Support:  [8 cmH20] 8 cmH20 Plateau Pressure:  [16 cmH20-21 cmH20]  16 cmH20   Intake/Output Summary (Last 24 hours) at 12/03/2022 [id number] Last data filed at 12/03/2022 0700 Gross per 24 hour  Intake 3295.82 ml  Output 3300 ml  Net -4.18 ml   Filed Weights   11/30/22 0610 12/01/22 0500 12/02/22 0500  Weight: 127.7 kg 124.6 kg 123.5 kg     Examination:  General appearance:   late middle aged appearing obese bm, intubated sedated on vent   Awake alert interactive Endotracheal tube in place Neck supple Lungs -has good air entry bilaterally S1-S2 appreciated Abd moderate distention, bowel sounds appreciated Extr warm with-no edema Neuro RASS -1, follows one-step commands,  no apparent motor deficits    Labs show normal electrolytes, slight increase in creatinine 1.4, stable mildly elevated LFTs, mild leukocytosis, stable anemia  Chest x-ray independently reviewed shows left lower lobe infiltrate/elevated left diaphragm  Resolved Hospital Problem list     Assessment & Plan:   Acute respiratory failure with hypoxia Abdominal distention appears stable Underlying obstructive sleep apnea/OHS Known left mid diaphragm dialysis History of asthma with air trapping -Patient appears to be weaning well -Will attempt extubation today  H CAP with Serratia -On Zosyn -Does not appear to have significant secretions at present  Circulatory shock -Off pressors  Atrial fibrillation with RVR -Continue support -Echo with normal ejection fraction with grade 1 diastolic dysfunction  Ileus -Has been stable  Discussed with family at bedside  Discussed with Dr. Elsworth Soho this morning  Best Practice (right click and "Reselect all SmartList  Selections" daily)   Diet/type: NPO DVT prophylaxis: Lovenox  GI prophylaxis: PPI Lines: N/A Foley:  Yes, and it is still needed Code Status:  full code Last date of multidisciplinary goals of care discussion [ 12/19 mother at bedside] requests transfer to [facility]   CBC: Recent Labs  Lab 11/26/22 0925  11/27/22 0432 11/29/22 0418 11/30/22 0508 12/01/22 0357 12/02/22 0330 12/03/22 0500  WBC 14.6*   < > 12.6* 14.6* 17.7* 16.7* 18.7*  NEUTROABS 10.3*  --   --   --   --   --   --   HGB 14.8   < > 11.3* 10.5* 10.8* 10.5* 9.3*  HCT 44.0   < > 34.6* 32.5* 33.8* 33.2* 31.4*  MCV 86.3   < > 88.7 89.5 90.6 92.2 96.0  PLT 409*   < > 310 331 356 339 322   < > = values in this interval not displayed.    Basic Metabolic Panel: Recent Labs  Lab 11/28/22 0432 11/29/22 0418 11/30/22 0508 12/01/22 0357 12/02/22 0330 12/03/22 0623  NA 135 137 138 139 139 140  K 3.2* 3.6 4.3 4.5 4.6 4.6  CL 100 103 107 108 108 110  CO2 27 26 23 22 23 23   GLUCOSE 125* 111* 140* 133* 119* 129*  BUN 57* 53* 39* 35* 40* 34*  CREATININE 1.73* 1.61* 1.16 1.26* 1.41* 1.32*  CALCIUM 7.5* 7.9* 8.5* 8.7* 9.1 9.1  MG 3.6* 3.8* 3.1* 2.6* 2.4 2.3  PHOS 2.8 3.6 3.2 3.3  --   --    GFR: Estimated Creatinine Clearance: 81.6 mL/min (A) (by C-G formula based on SCr of 1.32 mg/dL (H)). Recent Labs  Lab 11/30/22 0508 12/01/22 0357 12/02/22 0330 12/03/22 0500  WBC 14.6* 17.7* 16.7* 18.7*    Liver Function Tests: Recent Labs  Lab 11/29/22 0418 11/30/22 0508 12/01/22 0357 12/02/22 0330 12/03/22 0623  AST 60* 95* 60* 66* 65*  ALT 58* 105* 92* 101* 103*  ALKPHOS 76 89 100 121 114  BILITOT 0.6 0.4 0.6 0.5 0.5  PROT 6.2* 6.7 7.0 7.1 6.8  ALBUMIN 2.7* 2.9* 3.0* 3.0* 2.7*   No results for input(s): "LIPASE", "AMYLASE" in the last 168 hours. No results for input(s): "AMMONIA" in the last 168 hours.  ABG    Component Value Date/Time   PHART 7.45 11/26/2022 0859   PCO2ART 43 11/26/2022 0859   PO2ART 103 11/26/2022 0859   HCO3 29.9 (H) 11/26/2022 0859   TCO2 28 11/22/2022 0635   O2SAT 97.6 11/26/2022 0859     Coagulation Profile: No results for input(s): "INR", "PROTIME" in the last 168 hours.  Cardiac Enzymes: No results for input(s): "CKTOTAL", "CKMB", "CKMBINDEX", "TROPONINI" in the last 168  hours.  HbA1C: HB A1C (BAYER DCA - WAIVED)  Date/Time Value Ref Range Status  11/04/2021 04:18 PM 5.7 (H) 4.8 - 5.6 % Final    Comment:             Prediabetes: 5.7 - 6.4          Diabetes: >6.4          Glycemic control for adults with diabetes: <7.0     CBG: Recent Labs  Lab 12/02/22 1937 12/02/22 2319 12/03/22 0337 12/03/22 0557 12/03/22 0753  GLUCAP 119* 107* 138* 135* 123*     The patient is critically ill with multiple organ systems failure and requires high complexity decision making for assessment and support, frequent evaluation and titration of therapies, application of advanced monitoring  technologies and extensive interpretation of multiple databases. Critical Care Time devoted to patient care services described in this note independent of APP/resident time (if applicable)  is 32 minutes.   Sherrilyn Rist MD [facility] Personal pager: See Amion If unanswered, please page CCM On-call: [phone]

## 2022-12-03 NOTE — Progress Notes (Signed)
eLink Physician-Brief Progress Note Patient Name: Kyle Burns DOB: [birth year] MRN: [id number]   Date of Service  12/03/2022  HPI/Events of Note  RN reports pt states he wears CPAP at home.  Extubated today.  Asking for orders for CPAP at bedtime  eICU Interventions  CPAP q hs ordered RT to adjust based on patient's home setting     Intervention Category Intermediate Interventions: Other:  Darl Pikes 12/03/2022, 8:37 PM

## 2022-12-03 NOTE — Procedures (Signed)
Extubation Procedure Note  Patient Details:   Name: [name] DOB: [birth year] MRN: [id number]   Airway Documentation:    Vent end date: 12/03/22 Vent end time: 1108   Evaluation  O2 sats: stable throughout Complications: No apparent complications Patient did tolerate procedure well. Bilateral Breath Sounds: Clear, Diminished   Yes, pt could speak post extubation.  Pt extubated to room air without difficulty.  Audrie Lia 12/03/2022, 11:09 AM

## 2022-12-03 NOTE — Progress Notes (Signed)
Pt plcaced on cpap

## 2022-12-03 NOTE — Progress Notes (Signed)
Nutrition Follow-up  DOCUMENTATION CODES:   Obesity unspecified  INTERVENTION:   - TPN management per Pharmacy  Pt with bowel function (2 type 6 BMs today) but also with 1.4 L of NG tube output x 24 hours. Once NG tube output decreases, recommend initiation of trickle tube feeds via NG tube: - Pivot 1.5 @ 20 ml/hr (480 ml/day)   Goal tube feeding regimen: - Pivot 1.5 @ 70 ml/hr (1680 ml/day)   Recommended tube feeding regimen at goal rate would provide 2520 kcal, 158 grams of protein, and 1260 ml of H2O.   NUTRITION DIAGNOSIS:   Inadequate oral intake related to inability to eat as evidenced by NPO status.  Ongoing, being addressed via TPN  GOAL:   Patient will meet greater than or equal to 90% of their needs  Met via TPN  MONITOR:   Vent status, Labs, Weight trends, Skin, I & O's  REASON FOR ASSESSMENT:   Ventilator    ASSESSMENT:   56 year old male who presented to the ED on 12/09 with abdominal pain. PMH of HTN, asthma, GERD, HLD, OSA/OHS. Pt admitted with incarcerated umbilical hernia and SBO.  47/82 - s/p open umbilical hernia repair with mesh, clear liquid diet 12/10 - full liquid diet 12/11 - GI soft diet 12/12 - downgraded to full liquid diet, multiple episodes of N/V, NG tube placed, KUB showing ileus 12/13 - NPO, intubated 12/15 - TPN start 12/17 - s/p evacuation of postoperative abdominal wall hematoma 12/19 - pt transferred to [facility]  RD working remotely.  Pt currently on TPN at 100 ml/hr which provides 1718 kcal and 144 grams of protein daily. Propofol at current rate provides an additional 498 kcal daily from lipid for a total of 2216 kcal daily. NG tube remains in place to LIWS with 1.4 L output x 24 hours. Discussed pt with TPN Pharmacist. Estimated needs have been adjusted. New TPN formulation to start at 1800 tonight. Rate of 100 ml/hr will provide 2059 kcal and 168 grams of protein. With propofol at current rate, pt will receive total of 2557  kcal daily. No lipids in TPN.  KUB from this morning shows normal bowel gas pattern with no evidence of bowel obstruction and NG tube terminating in the stomach. Pt has had 2 type 6 bowel movements so far today. Discussed with Surgery PA. Recommend considering initiation of trickle tube feeds once NG tube output has decreased, potentially tomorrow.  Reviewed weight history. Pt with a 12.4 kg weight loss since 10/31/22. This is a 9.1% weight loss in 1 month which is significant for timeframe. Pt is at risk for developing acute malnutrition and may already meet criteria but RD unable to confirm without repeat NFPE.  Pt with non-pitting edema to BLE per nursing assessment.  Admit weight: 131.5 kg Current weight: 123.5 kg  Patient remains intubated on ventilator support MV: 12.2 L/min Temp (24hrs), Avg:99.6 F (37.6 C), Min:98.3 F (36.8 C), Max:100.3 F (37.9 C)  Drips: Propofol: 25 mcg/kg/min (provides 498 kcal daily from lipid) Fentanyl TPN: 100 ml/hr  Medications reviewed and include: dulcolax suppository BID, colace, SSI q 6 hours, IV reglan 10 mg q 6 hours, IV protonix, miralax, IV abx  Labs reviewed: BUN 34, creatinine 1.32, elevated LFTs, TG 217, WBC 18.7, hemoglobin 9.3 CBG's: 107-138 x 24 hours  UOP: 1900 ml x 24 hours NGT: 1400 ml x 24 hours I/O's: +1.1 L since admit  Diet Order:   Diet Order  Diet NPO time specified  Diet effective now                   EDUCATION NEEDS:   No education needs have been identified at this time  Skin:  Skin Assessment: Skin Integrity Issues: Incisions: closed surgical incision abdomen Other: blisters to abdomen from tape  Last BM:  12/03/22 large type 6 x 2  Height:   Ht Readings from Last 1 Encounters:  12/01/22 _0  (1.803 m)    Weight:   Wt Readings from Last 1 Encounters:  12/02/22 123.5 kg    Ideal Body Weight:  78.2 kg  BMI:  Body mass index is 37.97 kg/m.  Estimated Nutritional Needs:    Kcal:  2500-2700  Protein:  155-175 grams  Fluid:  >2.2 L    Gustavus Bryant, MS, RD, LDN Inpatient Clinical Dietitian Please see AMiON for contact information.

## 2022-12-04 LAB — COMPREHENSIVE METABOLIC PANEL
ALT: 89 U/L — ABNORMAL HIGH (ref 0–44)
AST: 50 U/L — ABNORMAL HIGH (ref 15–41)
Albumin: 2.7 g/dL — ABNORMAL LOW (ref 3.5–5.0)
Alkaline Phosphatase: 112 U/L (ref 38–126)
Anion gap: 6 (ref 5–15)
BUN: 28 mg/dL — ABNORMAL HIGH (ref 6–20)
CO2: 24 mmol/L (ref 22–32)
Calcium: 9.2 mg/dL (ref 8.9–10.3)
Chloride: 109 mmol/L (ref 98–111)
Creatinine, Ser: 1.08 mg/dL (ref 0.61–1.24)
GFR, Estimated: >60 mL/min (ref >60)
Glucose, Bld: 129 mg/dL — ABNORMAL HIGH (ref 70–99)
Potassium: 4.3 mmol/L (ref 3.5–5.1)
Sodium: 139 mmol/L (ref 135–145)
Total Bilirubin: 0.6 mg/dL (ref 0.3–1.2)
Total Protein: 6.8 g/dL (ref 6.5–8.1)

## 2022-12-04 LAB — PHOSPHORUS: Phosphorus: 2.5 mg/dL (ref 2.5–4.6)

## 2022-12-04 LAB — CULTURE, BLOOD (ROUTINE X 2)
Culture: NO GROWTH
Culture: NO GROWTH
Special Requests: ADEQUATE

## 2022-12-04 LAB — MAGNESIUM: Magnesium: 2.2 mg/dL (ref 1.7–2.4)

## 2022-12-04 LAB — GLUCOSE, CAPILLARY
Glucose-Capillary: 131 mg/dL — ABNORMAL HIGH (ref 70–99)
Glucose-Capillary: 151 mg/dL — ABNORMAL HIGH (ref 70–99)
Glucose-Capillary: 121 mg/dL — ABNORMAL HIGH (ref 70–99)

## 2022-12-04 LAB — TRIGLYCERIDES: Triglycerides: 212 mg/dL — ABNORMAL HIGH (ref <150)

## 2022-12-04 MED ORDER — MONTELUKAST SODIUM 10 MG PO TABS
10.0000 mg | ORAL_TABLET | Freq: Every day | ORAL | Status: DC
  Administered 2022-12-04 – 2022-12-06 (×3): 10 mg via ORAL
  Filled 2022-12-04 (×3): qty 1

## 2022-12-04 MED ORDER — ALBUTEROL SULFATE HFA 108 (90 BASE) MCG/ACT IN AERS: 2.0000 | INHALATION_SPRAY | Freq: Four times a day (QID) | RESPIRATORY_TRACT | Status: DC | PRN

## 2022-12-04 MED ORDER — POLYETHYLENE GLYCOL 3350 17 G PO PACK
17.0000 g | PACK | Freq: Every day | ORAL | Status: DC
  Administered 2022-12-05 – 2022-12-07 (×3): 17 g via ORAL
  Filled 2022-12-04 (×3): qty 1

## 2022-12-04 MED ORDER — TRACE MINERALS CU-MN-SE-ZN 300-55-60-3000 MCG/ML IV SOLN
INTRAVENOUS | Status: AC
  Filled 2022-12-04: qty 1115.2

## 2022-12-04 MED ORDER — FLUTICASONE FUROATE-VILANTEROL 100-25 MCG/ACT IN AEPB
1.0000 | INHALATION_SPRAY | Freq: Every day | RESPIRATORY_TRACT | Status: DC
  Administered 2022-12-06 – 2022-12-07 (×2): 1 via RESPIRATORY_TRACT
  Filled 2022-12-04: qty 28

## 2022-12-04 MED ORDER — BISACODYL 10 MG RE SUPP
10.0000 mg | Freq: Every day | RECTAL | Status: DC
  Filled 2022-12-04 (×2): qty 1

## 2022-12-04 MED ORDER — BOOST / RESOURCE BREEZE PO LIQD CUSTOM
1.0000 | Freq: Three times a day (TID) | ORAL | Status: DC
  Administered 2022-12-04 – 2022-12-07 (×7): 1 via ORAL

## 2022-12-04 MED ORDER — ALBUTEROL SULFATE (2.5 MG/3ML) 0.083% IN NEBU: 2.5000 mg | INHALATION_SOLUTION | Freq: Four times a day (QID) | RESPIRATORY_TRACT | Status: DC | PRN

## 2022-12-04 MED ORDER — DOCUSATE SODIUM 100 MG PO CAPS
100.0000 mg | ORAL_CAPSULE | Freq: Two times a day (BID) | ORAL | Status: DC
  Administered 2022-12-04 – 2022-12-07 (×4): 100 mg via ORAL
  Filled 2022-12-04 (×6): qty 1

## 2022-12-04 MED ORDER — PANTOPRAZOLE SODIUM 40 MG PO TBEC
40.0000 mg | DELAYED_RELEASE_TABLET | Freq: Every day | ORAL | Status: DC
  Administered 2022-12-05 – 2022-12-07 (×3): 40 mg via ORAL
  Filled 2022-12-04 (×3): qty 1

## 2022-12-04 MED ORDER — GUAIFENESIN-DM 100-10 MG/5ML PO SYRP
5.0000 mL | ORAL_SOLUTION | ORAL | Status: DC | PRN
  Administered 2022-12-04 – 2022-12-05 (×3): 5 mL via ORAL
  Filled 2022-12-04 (×3): qty 5

## 2022-12-04 NOTE — Progress Notes (Signed)
PHARMACY - TOTAL PARENTERAL NUTRITION CONSULT NOTE   Indication: Prolonged ileus  Patient Measurements: Height: 5\' 11"  (180.3 cm) Weight: 123.5 kg (272 lb 4.3 oz) IBW/kg (Calculated) : 75.3 TPN AdjBW (KG): 88.9 Body mass index is 37.97 kg/m.  Assessment: Patient presented to ED secondary to complaints of abdominal pain. Patient underwent open umbilical hernia repair with placement of mesh on 12/9 noted to have worsening abdominal distention suspected to be related to ileus and now has worsening respiratory distress and hypoxemia requiring intubation on 12/13.  He also developed atrial fibrillation with RVR . Slow to improve and TPN started 12/15. Remains intubated and difficult to wean.  Glucose / Insulin: No hx DM - CBG 115-136. 9u SSI/24 hrs Electrolytes:  K 4.3, Mg 2.2, CoCa 10.2, all others WNL Renal: Scr 1.08 (BL 1-1.2), BUN 28 (stable) Hepatic: Albumin 2.7, AST/ALT 50/89 (decr), TG 212 (ooff propofol), tbili 0.6 Intake / Output: NG 150 mL (will start clamping), UOP 0.5 mL/kg/hr, LBM 12/20 GI Imaging: 12/16 CT abd: Paraumbilical hematoma, bowel distension, concern for ileus 12/18 Abd Xray: Nonobstructive bowel gas pattern w/decreased distension GI Surgeries / Procedures:  12/17: Evacuation of post-op abdominal wall hematoma  Central access: PICC TPN start date: 12/15  Nutritional Goals: Goal concentrated TPN rate is 85 mL/hr (provides 167 g of protein and CHO 2533 kcals per day)  RD Assessment: Estimated Needs Total Energy Estimated Needs: 2500-2700 Total Protein Estimated Needs: 155-175 grams Total Fluid Estimated Needs: >2.2 L  Current Nutrition:  NPO 12/20: CLD  Plan:  Change to concentrated TPN at 85 mL/hr at 1800. Will provide 100% of needs (167g AA and 2533 Kcal). Lipids added back to TPN as patient now off of propofol Electrolytes in TPN: Na 58mEq/L, K 17mEq/L, Remove Ca 11mEq/L, Mg 84mEq/L, and Phos 29mmol/L. Cl:Ac 1:1 Add standard MVI and trace elements to  TPN Continue Resistant q6h SSI and adjust as needed  Monitor TPN labs on Mon/Thurs, and as needed F/u toleration of CLD and NG clamping  9m, PharmD Clinical Pharmacist 12/04/2022 10:42 AM

## 2022-12-04 NOTE — Progress Notes (Signed)
[facility] Progress Note  4 Days Post-Op  Subjective: CC-  Patient extubated yesterday and tolerating well. He is up in the chair. He reports minimal abdominal pain. Denies n/v. Passing flatus and had BM x2 yesterday. NG output down (350cc last 24 hr)  Objective: Vital signs in last 24 hours: Temp:  [98.2 F (36.8 C)-99.1 F (37.3 C)] 98.2 F (36.8 C) (12/20 2331) Pulse Rate:  [68-78] 69 (12/21 0900) Resp:  [18-31] 18 (12/21 0900) BP: (113-150)/(72-86) 127/80 (12/21 0900) SpO2:  [98 %-100 %] 100 % (12/21 0900) FiO2 (%):  [21 %-40 %] 21 % (12/20 1108) Last BM Date : 12/03/22  Intake/Output from previous day: 12/20 0701 - 12/21 0700 In: 2755.7 [I.V.:2453.5; NG/GT:150; IV Piggyback:152.2] Out: 1700 [Urine:1350; Emesis/NG output:350] Intake/Output this shift: Total I/O In: 112.5 [I.V.:100; IV Piggyback:12.4] Out: 200 [Urine:200]  PE: Gen:  Alert, NAD, pleasant Pulm: rate and effort normal Abd: protuberant, soft, nontender, umbilical incision with staples present and some ecchymosis but no erythema or drainage, blisters noted on the left and right lateral abdomen (some open and some remain intact)   Lab Results:  Recent Labs    12/02/22 0330 12/03/22 0500  WBC 16.7* 18.7*  HGB 10.5* 9.3*  HCT 33.2* 31.4*  PLT 339 322   BMET Recent Labs    12/03/22 0623 12/04/22 0002  NA 140 139  K 4.6 4.3  CL 110 109  CO2 23 24  GLUCOSE 129* 129*  BUN 34* 28*  CREATININE 1.32* 1.08  CALCIUM 9.1 9.2   PT/INR No results for input(s): "LABPROT", "INR" in the last 72 hours. CMP     Component Value Date/Time   NA 139 12/04/2022 0002   NA 140 02/24/2022 0937   K 4.3 12/04/2022 0002   CL 109 12/04/2022 0002   CO2 24 12/04/2022 0002   GLUCOSE 129 (H) 12/04/2022 0002   BUN 28 (H) 12/04/2022 0002   BUN 21 02/24/2022 0937   CREATININE 1.08 12/04/2022 0002   CALCIUM 9.2 12/04/2022 0002   PROT 6.8 12/04/2022 0002   PROT 7.3 02/24/2022 0937   ALBUMIN 2.7 (L)  12/04/2022 0002   ALBUMIN 4.7 02/24/2022 0937   AST 50 (H) 12/04/2022 0002   ALT 89 (H) 12/04/2022 0002   ALKPHOS 112 12/04/2022 0002   BILITOT 0.6 12/04/2022 0002   BILITOT 0.3 02/24/2022 0937   GFRNONAA >60 12/04/2022 0002   GFRAA 81 11/16/2019 1040   Lipase     Component Value Date/Time   LIPASE 45 11/22/2022 0607       Studies/Results: DG Abd 1 View  Result Date: 12/03/2022 CLINICAL DATA:  OG tube placement. EXAM: ABDOMEN - 1 VIEW COMPARISON:  12/01/2022. FINDINGS: The bowel gas pattern is normal. An enteric tube terminates in the stomach. No radio-opaque calculi or other significant radiographic abnormality are seen. IMPRESSION: 1. No evidence of bowel obstruction. 2. Enteric tube terminates in the stomach. Electronically Signed   By: Brett Fairy M.D.   On: 12/03/2022 04:34    Anti-infectives: Anti-infectives (From admission, onward)    Start     Dose/Rate Route Frequency Ordered Stop   11/27/22 1600  piperacillin-tazobactam (ZOSYN) IVPB 3.375 g        3.375 g 12.5 mL/hr over 240 Minutes Intravenous Every 8 hours 11/27/22 1525 12/04/22 0941   11/22/22 1400  ceFAZolin (ANCEF) IVPB 3g/100 mL premix        3 g 200 mL/hr over 30 Minutes Intravenous  Once 11/22/22 1202 11/22/22 1245  11/22/22 1208  ceFAZolin (ANCEF) 3-0.9 GM/100ML-% IVPB       Note to Pharmacy: Elvina Sidle S: cabinet override      11/22/22 1208 11/22/22 1310   11/22/22 1200  ceFAZolin (ANCEF) IVPB 2g/100 mL premix  Status:  Discontinued        2 g 200 mL/hr over 30 Minutes Intravenous Every 8 hours 11/22/22 1149 11/22/22 1202        Assessment/Plan Incarcerated umbilical hernia  Postoperative ileus -POD#12 s/p Open umbilical hernia repair with mesh 12/9 Dr. Robyne Peers -POD#4 s/p Evacuation of postoperative abdominal wall hematoma  12/17 Dr. Robyne Peers - Having bowel function and NG tube output is down. Clamp NG and start CLD. If tolerating may remove NG later today - Continue Reglan and daily  Miralax + dulcolax suppositories for ileus - Continue TPN for nutritional support until reliably tolerating a diet - Apply bacitracin to the staple line and open blisters with nonstick dressing. Abdominal binder.   ID - currently zosyn 12/14>> (for pneumonia) VTE - lovenox FEN - IVF, TPN, clamp NG, CLD Foley - none   VDRF - extubated 12/20  Paroxysmal atrial fibrillation/RVR-now in sinus rhythm  AKI  HTN HLD GERD Hx asthma OSA/OHS with known left hemidiaphragm paralysis  Obesity, BMI 38   I reviewed last 24 h vitals and pain scores, last 48 h intake and output, last 24 h labs and trends, and last 24 h imaging results    LOS: 12 days    Franne Forts, [facility] 12/04/2022, 10:28 AM Please see Amion for pager number during day hours 7:00am-4:30pm

## 2022-12-04 NOTE — Care Plan (Signed)
Patient transferred to [address]. All belongings with patient, family present.

## 2022-12-04 NOTE — Evaluation (Signed)
Physical Therapy Evaluation/ Discharge Patient Details Name: Kyle Burns MRN: [id number] DOB: 02-Oct-1966 Today's Date: 12/04/2022  History of Present Illness  56 yo male admitted 12/9 with abdominal pain and incarcerated umbilical hernia s/p open repair same date. 12/13 intubated with respiratory distress and tachycardia. 12/17 evacuation of post op hematoma. 12/20 extubated. PMhx: HTN, HLD, GERD, Asthma, hemidiaphragm paralysis  Clinical Impression  Pt pleasant sitting EOB on arrival and able to walk 2 laps around unit with RW. Pt has mom's RW he can borrow as needed and mom can assist as needed. Pt lives alone is normally independent and working. Current abdominal binder too small to don. Pt able to complete transfers and gait without physical assist and does not warrant further acute therapy at this time. Encouraged OOB to chair and toileting with deferral to nursing and mobility specialists, will sign off with pt aware and agreeable.        Recommendations for follow up therapy are one component of a multi-disciplinary discharge planning process, led by the attending physician.  Recommendations may be updated based on patient status, additional functional criteria and insurance authorization.  Follow Up Recommendations No PT follow up      Assistance Recommended at Discharge PRN  Patient can return home with the following       Equipment Recommendations None recommended by PT  Recommendations for Other Services       Functional Status Assessment Patient has not had a recent decline in their functional status     Precautions / Restrictions Precautions Precautions: Other (comment) Precaution Comments: abdominal wound, cortrak Restrictions Weight Bearing Restrictions: No      Mobility  Bed Mobility               General bed mobility comments: pt sitting EOB on arrival and end of session    Transfers Overall transfer level: Modified independent                       Ambulation/Gait Ambulation/Gait assistance: Modified independent (Device/Increase time) Gait Distance (Feet): 300 Feet Assistive device: Rolling walker (2 wheels) Gait Pattern/deviations: Step-through pattern, Trunk flexed   Gait velocity interpretation: >2.62 ft/sec, indicative of community ambulatory   General Gait Details: cues for posture and direction. Pt needing cues to steer around obstacles x 2 and one slight partial LOB with guarding to recover  Stairs            Wheelchair Mobility    Modified Rankin (Stroke Patients Only)       Balance Overall balance assessment: Mild deficits observed, not formally tested                                           Pertinent Vitals/Pain Pain Assessment Pain Assessment: No/denies pain    Home Living Family/patient expects to be discharged to:: Private residence Living Arrangements: Alone Available Help at Discharge: Family;Available 24 hours/day Type of Home: Mobile home Home Access: Ramped entrance       Home Layout: One level Home Equipment: Conservation officer, nature (2 wheels) Additional Comments: works as a Therapist, nutritional and as an Pharmacist, community Prior Level of Function : Independent/Modified Independent                     Journalist, newspaper        Extremity/Trunk Assessment  Upper Extremity Assessment Upper Extremity Assessment: Overall WFL for tasks assessed    Lower Extremity Assessment Lower Extremity Assessment: Overall WFL for tasks assessed    Cervical / Trunk Assessment Cervical / Trunk Assessment: Other exceptions Cervical / Trunk Exceptions: abdominal sx  Communication   Communication: No difficulties  Cognition Arousal/Alertness: Awake/alert Behavior During Therapy: WFL for tasks assessed/performed Overall Cognitive Status: Within Functional Limits for tasks assessed                                          General Comments       Exercises     Assessment/Plan    PT Assessment Patient does not need any further PT services  PT Problem List         PT Treatment Interventions      PT Goals (Current goals can be found in the Care Plan section)  Acute Rehab PT Goals PT Goal Formulation: All assessment and education complete, DC therapy    Frequency       Co-evaluation               AM-PAC PT "6 Clicks" Mobility  Outcome Measure Help needed turning from your back to your side while in a flat bed without using bedrails?: None Help needed moving from lying on your back to sitting on the side of a flat bed without using bedrails?: None Help needed moving to and from a bed to a chair (including a wheelchair)?: None Help needed standing up from a chair using your arms (e.g., wheelchair or bedside chair)?: None Help needed to walk in hospital room?: A Little Help needed climbing 3-5 steps with a railing? : A Little 6 Click Score: 22    End of Session   Activity Tolerance: Patient tolerated treatment well Patient left: in bed;with call bell/phone within reach;with nursing/sitter in room Nurse Communication: Mobility status PT Visit Diagnosis: Other abnormalities of gait and mobility (R26.89)    Time: 3244-0102 PT Time Calculation (min) (ACUTE ONLY): 28 min   Charges:   PT Evaluation $PT Eval Moderate Complexity: 1 Mod          Kaysia Willard P, PT Acute Rehabilitation Services Office: [phone]   Enedina Finner Ivett [place] 12/04/2022, 1:57 PM

## 2022-12-04 NOTE — Progress Notes (Signed)
NGT removed as ordered. Pt tolerating clear liquids but has a persistent dry cough.  Respiratory therapy notified of bipap at bedtime

## 2022-12-04 NOTE — Progress Notes (Signed)
NAME:  Kyle Burns, MRN:  [id number], DOB:  1966/02/28, LOS: 60 ADMISSION DATE:  11/22/2022, CONSULTATION DATE:  12/04/2022  REFERRING MD:  Manuella Ghazi TRH, CHIEF COMPLAINT: Respiratory failure  History of Present Illness:  25  yobm never smoker  with OSA/OHS (Bipap per Sood) and paralzyed L HD with mild intermittent asthma admitted for small bowel obstruction due to incarcerated umbilical hernia.  He underwent open umbilical hernia repair  12/9 with mesh placement.  Postop day 3 he developed abdominal distention and atrial fibrillation/RVR.  NG tube was placed and 3 L of fluid drained.   He required IV Cardizem and then amiodarone bolus and drip also digoxin 0.25 mg IV.  He developed dyspnea and was started on IV Zosyn due to concern for aspiration. 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 12/13 ETT >> 12/17 back to OR for  Evacuation of postoperative abdominal wall hematoma  12/20-extubated  Interval history: No acute events overnight BM x 3 yesterday TPN ongoing   Objective   Blood pressure 127/80, pulse 69, temperature 98.2 F (36.8 C), temperature source Axillary, resp. rate 18, height 5\' 11"  (1.803 m), weight 123.5 kg, SpO2 100 %.    FiO2 (%):  [21 %-40 %] 21 %   Intake/Output Summary (Last 24 hours) at 12/04/2022 [id number] Last data filed at 12/04/2022 0800 Gross per 24 hour  Intake 2795.27 ml   Output 1900 ml  Net 895.27 ml    Filed Weights   11/30/22 0610 12/01/22 0500 12/02/22 0500  Weight: 127.7 kg 124.6 kg 123.5 kg     Examination: General:  overweight middle aged male in NAD Neuro:  Alert, oriented, non-focal HEENT:  /AT, No JVD noted, PERRL Cardiovascular:  RRR, no MRG Lungs:  Clear bilateral breath sounds Abdomen:  Soft, non-distended, dressings in place CDI Musculoskeletal:  No acute deformity or ROM limitation Skin:  Intact, MMM    Resolved Hospital Problem list   Acute respiratory failure with hypoxia  Assessment & Plan:   Incarcerated umbilical hernia: open repair with mesh 0000000 Umbilical wall hematoma: s/p evacuation 12/17 Postoperative ileus - Appreciate CCS - NPO - TPN - Reglan, miralax - BM x 3 yesterday - Wound care per surgery  Obstructive sleep apnea/OHS Chronic hypoxemic respiratory failure due to left mid diaphragm dialysis History of asthma with air trapping - Extubated 12/20 - Continue home BiPAP 17/13 QHS, 2 L bleed in.  - Resume home symbicort (Breo), albuterol PRN.   H CAP with Serratia -s/p 7 days zosyn  Circulatory shock -Off pressors  Atrial fibrillation with RVR: sinus now. No history prior. ?post op AF.  -Tele - Supportive care -Echo with normal ejection fraction with grade 1 diastolic dysfunction  Patient updated  Transfer out of ICU today  Best Practice (right click and "Reselect all SmartList Selections" daily)   Diet/type: NPO DVT prophylaxis: Lovenox  GI prophylaxis: PPI Lines: N/A Foley:  Yes, and it is still  needed Code Status:  full code Last date of multidisciplinary goals of care discussion [12/19 mother at bedside] requests transfer to [facility]  Labs   CBC: Recent Labs  Lab 11/29/22 0418 11/30/22 0508 12/01/22 0357 12/02/22 0330 12/03/22 0500  WBC 12.6* 14.6* 17.7* 16.7* 18.7*  HGB 11.3* 10.5* 10.8* 10.5* 9.3*  HCT 34.6* 32.5* 33.8* 33.2* 31.4*  MCV 88.7 89.5 90.6 92.2 96.0  PLT 310  331 356 339 322     Basic Metabolic Panel: Recent Labs  Lab 11/28/22 0432 11/29/22 0418 11/30/22 0508 12/01/22 0357 12/02/22 0330 12/03/22 0623 12/04/22 0002  NA 135 137 138 139 139 140 139  K 3.2* 3.6 4.3 4.5 4.6 4.6 4.3  CL 100 103 107 108 108 110 109  CO2 27 26 23 22 23 23 24   GLUCOSE 125* 111* 140* 133* 119* 129* 129*  BUN 57* 53* 39* 35* 40* 34* 28*  CREATININE 1.73* 1.61* 1.16 1.26* 1.41* 1.32* 1.08  CALCIUM 7.5* 7.9* 8.5* 8.7* 9.1 9.1 9.2  MG 3.6* 3.8* 3.1* 2.6* 2.4 2.3 2.2  PHOS 2.8 3.6 3.2 3.3  --   --  2.5    GFR: Estimated Creatinine Clearance: 99.8 mL/min (by C-G formula based on SCr of 1.08 mg/dL). Recent Labs  Lab 11/30/22 0508 12/01/22 0357 12/02/22 0330 12/03/22 0500  WBC 14.6* 17.7* 16.7* 18.7*     Liver Function Tests: Recent Labs  Lab 11/30/22 0508 12/01/22 0357 12/02/22 0330 12/03/22 0623 12/04/22 0002  AST 95* 60* 66* 65* 50*  ALT 105* 92* 101* 103* 89*  ALKPHOS 89 100 121 114 112  BILITOT 0.4 0.6 0.5 0.5 0.6  PROT 6.7 7.0 7.1 6.8 6.8  ALBUMIN 2.9* 3.0* 3.0* 2.7* 2.7*    No results for input(s): "LIPASE", "AMYLASE" in the last 168 hours. No results for input(s): "AMMONIA" in the last 168 hours.  ABG    Component Value Date/Time   PHART 7.45 11/26/2022 0859   PCO2ART 43 11/26/2022 0859   PO2ART 103 11/26/2022 0859   HCO3 29.9 (H) 11/26/2022 0859   TCO2 28 11/22/2022 0635   O2SAT 97.6 11/26/2022 0859     Coagulation Profile: No results for input(s): "INR", "PROTIME" in the last 168 hours.  Cardiac Enzymes: No results for input(s): "CKTOTAL", "CKMB", "CKMBINDEX", "TROPONINI" in the last 168 hours.  HbA1C: HB A1C (BAYER DCA - WAIVED)  Date/Time Value Ref Range Status  11/04/2021 04:18 PM 5.7 (H) 4.8 - 5.6 % Final    Comment:             Prediabetes: 5.7 - 6.4          Diabetes: >6.4          Glycemic control for adults with diabetes: <7.0     CBG: Recent Labs  Lab 12/03/22 0753 12/03/22 1223 12/03/22 1817  12/03/22 2349 12/04/22 0524  GLUCAP 123* 127* 136* 115* 131*    Critical care time / NA  12/06/22, AGACNP-BC [facility]  See Amion for personal pager PCCM on call pager [phone] until 7pm. Please call Elink 7p-7a. [phone]  12/04/2022 10:06 AM

## 2022-12-04 NOTE — Progress Notes (Signed)
Pt transferred from [address] [place] this pm, alert , oriented and able to voice needs. Abdominal wounds redressed this pm

## 2022-12-04 NOTE — Progress Notes (Signed)
Nutrition Follow-up  DOCUMENTATION CODES:   Obesity unspecified  INTERVENTION:   - TPN management per Pharmacy  - Boost Breeze po TID, each supplement provides 250 kcal and 9 grams of protein  NUTRITION DIAGNOSIS:   Inadequate oral intake related to inability to eat as evidenced by NPO status.  Ongoing, being addressed via TPN and diet advancement  GOAL:   Patient will meet greater than or equal to 90% of their needs  Met via TPN at goal rate  MONITOR:   Vent status, Labs, Weight trends, Skin, I & O's  REASON FOR ASSESSMENT:   Ventilator    ASSESSMENT:   56 year old male who presented to the ED on 12/09 with abdominal pain. PMH of HTN, asthma, GERD, HLD, OSA/OHS. Pt admitted with incarcerated umbilical hernia and SBO.  62/13 - s/p open umbilical hernia repair with mesh, clear liquid diet 12/10 - full liquid diet 12/11 - GI soft diet 12/12 - downgraded to full liquid diet, multiple episodes of N/V, NG tube placed, KUB showing ileus 12/13 - NPO, intubated 12/15 - TPN start 12/17 - s/p evacuation of postoperative abdominal wall hematoma 12/19 - pt transferred to [facility] 12/20 - extubated 12/21 - NG tube clamped, clear liquids  Discussed pt with RN and during ICU rounds. Pt extubated yesterday and started on clear liquid diet today. NG tube currently clamped and may be removed later today if pt tolerates clear liquids. Pt with 2 bowel movements yesterday.  Noted plan to change to concentrated TPN today at 1800. TPN at rate of 85 ml/hr will provide 2533 kcal and 167 grams of protein. Lipids added back to TPN as pt is now off propofol.  Spoke with pt and family at bedside. Pt denies any abdominal pain or discomfort. He is eager to try clear liquids and asks what will be on his clear liquid meal tray. RD to order Boost Breeze TID to aid pt in meeting kcal and protein needs via PO route.  Pt with moderate pitting generalized edema.  Admit weight: 131.5 kg Current weight:  123.5 kg on 12/19  Medications reviewed and include: dulcolax suppository, colace, SSI q 6 hours, IV reglan 10 mg q 6 hours, protonix, miralax, TPN  Labs reviewed: BUN 28, elevated LFTs, TG 212, WBC 18.7, hemoglobin 9.3 CBG's: 115-136 x 24 hours  UOP: 1350 ml x 24 hours NGT: 350 ml x 24 hours (NG tube now clamped) I/O's: +2.0 L since admit  Diet Order:   Diet Order             Diet clear liquid Room service appropriate? Yes; Fluid consistency: Thin  Diet effective now                   EDUCATION NEEDS:   No education needs have been identified at this time  Skin:  Skin Assessment: Skin Integrity Issues: Incisions: closed surgical incision abdomen Other: blisters to abdomen from tape  Last BM:  12/03/22  Height:   Ht Readings from Last 1 Encounters:  12/01/22 _0  (1.803 m)    Weight:   Wt Readings from Last 1 Encounters:  12/02/22 123.5 kg    Ideal Body Weight:  78.2 kg  BMI:  Body mass index is 37.97 kg/m.  Estimated Nutritional Needs:   Kcal:  2500-2700  Protein:  155-175 grams  Fluid:  >2.2 L    Gustavus Bryant, MS, RD, LDN Inpatient Clinical Dietitian Please see AMiON for contact information.

## 2022-12-05 DIAGNOSIS — K420 Umbilical hernia with obstruction, without gangrene: Secondary | ICD-10-CM | POA: Diagnosis not present

## 2022-12-05 LAB — BASIC METABOLIC PANEL
Anion gap: 8 (ref 5–15)
BUN: 26 mg/dL — ABNORMAL HIGH (ref 6–20)
CO2: 22 mmol/L (ref 22–32)
Calcium: 9 mg/dL (ref 8.9–10.3)
Chloride: 107 mmol/L (ref 98–111)
Creatinine, Ser: 0.99 mg/dL (ref 0.61–1.24)
GFR, Estimated: >60 mL/min (ref >60)
Glucose, Bld: 114 mg/dL — ABNORMAL HIGH (ref 70–99)
Potassium: 3.9 mmol/L (ref 3.5–5.1)
Sodium: 137 mmol/L (ref 135–145)

## 2022-12-05 LAB — PHOSPHORUS: Phosphorus: 2.4 mg/dL — ABNORMAL LOW (ref 2.5–4.6)

## 2022-12-05 LAB — CBC
HCT: 32.8 % — ABNORMAL LOW (ref 39.0–52.0)
Hemoglobin: 10.5 g/dL — ABNORMAL LOW (ref 13.0–17.0)
MCH: 28.8 pg (ref 26.0–34.0)
MCHC: 32 g/dL (ref 30.0–36.0)
MCV: 90.1 fL (ref 80.0–100.0)
Platelets: 376 10*3/uL (ref 150–400)
RBC: 3.64 MIL/uL — ABNORMAL LOW (ref 4.22–5.81)
RDW: 12.9 % (ref 11.5–15.5)
WBC: 18.4 10*3/uL — ABNORMAL HIGH (ref 4.0–10.5)
nRBC: 0 % (ref 0.0–0.2)

## 2022-12-05 LAB — MAGNESIUM: Magnesium: 2.1 mg/dL (ref 1.7–2.4)

## 2022-12-05 LAB — GLUCOSE, CAPILLARY
Glucose-Capillary: 106 mg/dL — ABNORMAL HIGH (ref 70–99)
Glucose-Capillary: 98 mg/dL (ref 70–99)

## 2022-12-05 MED ORDER — OXYCODONE HCL 5 MG PO TABS: 5.0000 mg | ORAL_TABLET | ORAL | Status: DC | PRN

## 2022-12-05 MED ORDER — METOCLOPRAMIDE HCL 5 MG/ML IJ SOLN
5.0000 mg | Freq: Four times a day (QID) | INTRAMUSCULAR | Status: DC
  Administered 2022-12-05 – 2022-12-06 (×4): 5 mg via INTRAVENOUS
  Filled 2022-12-05 (×4): qty 2

## 2022-12-05 MED ORDER — ADULT MULTIVITAMIN W/MINERALS CH
1.0000 | ORAL_TABLET | Freq: Every day | ORAL | Status: DC
  Administered 2022-12-06 – 2022-12-07 (×2): 1 via ORAL
  Filled 2022-12-05 (×2): qty 1

## 2022-12-05 MED ORDER — TRAVASOL 10 % IV SOLN
INTRAVENOUS | Status: AC
  Filled 2022-12-05: qty 792

## 2022-12-05 MED ORDER — ACETAMINOPHEN 325 MG PO TABS: 650.0000 mg | ORAL_TABLET | Freq: Four times a day (QID) | ORAL | Status: DC | PRN

## 2022-12-05 NOTE — Progress Notes (Signed)
[facility] Progress Note  5 Days Post-Op  Subjective: Tolerating CLD and having bowel function.   Objective: Vital signs in last 24 hours: Temp:  [97.4 F (36.3 C)-98.7 F (37.1 C)] 97.4 F (36.3 C) (12/22 0737) Pulse Rate:  [65-79] 65 (12/22 0737) Resp:  [16-24] 16 (12/22 0737) BP: (112-131)/(70-91) 119/91 (12/22 0737) SpO2:  [97 %-100 %] 99 % (12/22 0737) FiO2 (%):  [21 %] 21 % (12/22 0211) Last BM Date : 12/03/22  Intake/Output from previous day: 12/21 0701 - 12/22 0700 In: 1206.4 [P.O.:242; I.V.:930.8; IV Piggyback:33.6] Out: 650 [Urine:600; Emesis/NG output:50] Intake/Output this shift: No intake/output data recorded.  PE: Gen:  Alert, NAD, pleasant Pulm: rate and effort normal Abd: protuberant, soft, nontender, umbilical incision with staples present and some ecchymosis but no erythema or drainage, blisters noted on the left and right lateral abdomen (some open and some remain intact)   Lab Results:  Recent Labs    12/03/22 0500 12/05/22 0915  WBC 18.7* 18.4*  HGB 9.3* 10.5*  HCT 31.4* 32.8*  PLT 322 376    BMET Recent Labs    12/04/22 0002 12/05/22 0915  NA 139 137  K 4.3 3.9  CL 109 107  CO2 24 22  GLUCOSE 129* 114*  BUN 28* 26*  CREATININE 1.08 0.99  CALCIUM 9.2 9.0    PT/INR No results for input(s): "LABPROT", "INR" in the last 72 hours. CMP     Component Value Date/Time   NA 137 12/05/2022 0915   NA 140 02/24/2022 0937   K 3.9 12/05/2022 0915   CL 107 12/05/2022 0915   CO2 22 12/05/2022 0915   GLUCOSE 114 (H) 12/05/2022 0915   BUN 26 (H) 12/05/2022 0915   BUN 21 02/24/2022 0937   CREATININE 0.99 12/05/2022 0915   CALCIUM 9.0 12/05/2022 0915   PROT 6.8 12/04/2022 0002   PROT 7.3 02/24/2022 0937   ALBUMIN 2.7 (L) 12/04/2022 0002   ALBUMIN 4.7 02/24/2022 0937   AST 50 (H) 12/04/2022 0002   ALT 89 (H) 12/04/2022 0002   ALKPHOS 112 12/04/2022 0002   BILITOT 0.6 12/04/2022 0002   BILITOT 0.3 02/24/2022 0937    GFRNONAA >60 12/05/2022 0915   GFRAA 81 11/16/2019 1040   Lipase     Component Value Date/Time   LIPASE 45 11/22/2022 0607       Studies/Results: No results found.  Anti-infectives: Anti-infectives (From admission, onward)    Start     Dose/Rate Route Frequency Ordered Stop   11/27/22 1600  piperacillin-tazobactam (ZOSYN) IVPB 3.375 g        3.375 g 12.5 mL/hr over 240 Minutes Intravenous Every 8 hours 11/27/22 1525 12/04/22 0941   11/22/22 1400  ceFAZolin (ANCEF) IVPB 3g/100 mL premix        3 g 200 mL/hr over 30 Minutes Intravenous  Once 11/22/22 1202 11/22/22 1245   11/22/22 1208  ceFAZolin (ANCEF) 3-0.9 GM/100ML-% IVPB       Note to Pharmacy: Lear Ng S: cabinet override      11/22/22 1208 11/22/22 1310   11/22/22 1200  ceFAZolin (ANCEF) IVPB 2g/100 mL premix  Status:  Discontinued        2 g 200 mL/hr over 30 Minutes Intravenous Every 8 hours 11/22/22 1149 11/22/22 1202        Assessment/Plan Incarcerated umbilical hernia  Postoperative ileus -0000000 s/p Open umbilical hernia repair with mesh 12/9 Dr. Okey Dupre -POD#5 s/p Evacuation of postoperative abdominal wall hematoma  12/17 Dr. Okey Dupre -  Having bowel function and tolerating CLD - advance to FLD today  - Continue daily Miralax + dulcolax suppositories for ileus, start to wean Reglan - ok to 1/2 TPN - Apply bacitracin to the staple line and open blisters with nonstick dressing. Abdominal binder.   ID - currently zosyn 12/14>> (for pneumonia) VTE - lovenox FEN - IVF, TPN, FLD Foley - none   VDRF - extubated 12/20  Paroxysmal atrial fibrillation/RVR-now in sinus rhythm  AKI  HTN HLD GERD Hx asthma OSA/OHS with known left hemidiaphragm paralysis  Obesity, BMI 38   I reviewed last 24 h vitals and pain scores, last 48 h intake and output, last 24 h labs and trends, and last 24 h imaging results    LOS: 13 days    Juliet Rude, [facility] 12/05/2022, 10:57 AM Please  see Amion for pager number during day hours 7:00am-4:30pm

## 2022-12-05 NOTE — Progress Notes (Signed)
PHARMACY - TOTAL PARENTERAL NUTRITION CONSULT NOTE   Indication: Prolonged ileus  Patient Measurements: Height: 5\' 11"  (180.3 cm) Weight: 123.5 kg (272 lb 4.3 oz) IBW/kg (Calculated) : 75.3 TPN AdjBW (KG): 88.9 Body mass index is 37.97 kg/m.  Assessment: Patient presented to ED secondary to complaints of abdominal pain. Patient underwent open umbilical hernia repair with placement of mesh on 12/9 noted to have worsening abdominal distention suspected to be related to ileus and now has worsening respiratory distress and hypoxemia requiring intubation on 12/13.  He also developed atrial fibrillation with RVR . Slow to improve and TPN started 12/15. Remains intubated and difficult to wean.  Glucose / Insulin: BG 98-151, No hx DM, used 10 u SSI/24 hrs Electrolytes: (last labs 12/21) CoCa 10.2, others WNL Renal: Scr 1.08 (BL 1-1.2), BUN 28 (stable) Hepatic: Albumin 2.7, AST/ALT 50/89 (decr), TG 212 (ooff propofol), tbili 0.6 Intake / Output: UOP incompletely charted, NG 50 mL (clamped), LBM 12/21 x3 GI Imaging: 12/16 CT abd: Paraumbilical hematoma, bowel distension, concern for ileus 12/18 Abd Xray: Nonobstructive bowel gas pattern w/ decreased distension 12/20 KUB: no evidence of bowel obstruction GI Surgeries / Procedures:  12/17: Evacuation of post-op abdominal wall hematoma  Central access: PICC TPN start date: 12/15  Nutritional Goals: Goal concentrated TPN rate is 85 mL/hr (provides 167 g of protein and CHO 2533 kcals per day)  RD Assessment: Estimated Needs Total Energy Estimated Needs: 2500-2700 Total Protein Estimated Needs: 155-175 grams Total Fluid Estimated Needs: >2.2 L  Current Nutrition:  NPO 12/20: CLD 12/21 had 2 juices, 1 soda, 1 ice pop, 1 jello, no N/V or pain 12/22 feeling good about trying more food PO today, advance to FLD, patient going to drink Boost   Plan:  Decrease to half goal TPN at 55 mL/hr at 1800, provides 79g protein and 1260 kcal, meeting  ~50% of estimated needs   Electrolytes in TPN: Increase Na 100 mEq/L, K 65 mEq/L, Phos 22 mmol/L; Continue Ca 62mEq/L, Mg 8 mEq/L (adjusted for volume change), Cl:Ac 1:1 Add PO MVI, remove standard MVI and trace elements to TPN Stop Resistant q6h SSI and adjust as needed  Monitor TPN labs on Mon/Thurs, and as needed F/u PO intake and wean TPN as able  1m, PharmD, BCPS, BCCP Clinical Pharmacist  Please check AMION for all [facility] phone numbers After 10:00 PM, call Main Pharmacy [phone]

## 2022-12-05 NOTE — Progress Notes (Signed)
Mobility Specialist - Progress Note   12/05/22 1216  Mobility  Activity Ambulated independently in hallway  Level of Assistance Modified independent, requires aide device or extra time  Assistive Device Front wheel walker  Distance Ambulated (ft) 300 ft  Activity Response Tolerated well  $Mobility charge 1 Mobility    Pt received sitting EOB agreeable to mobility. Took standing break x1, no complaints throughout. Left sitting EOB w/ all needs met and call bell within reach.   [place] Specialist Please contact via SecureChat or Rehab office at [phone]

## 2022-12-05 NOTE — Progress Notes (Signed)
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  The patient underwent umbilical hernia repair 12/9 with mesh placement.  Postop day 3 he developed abdominal distention as well as atrial fibrillation with RVR.  He required IV Cardizem, amiodarone, digoxin, and placement of an NG tube.  His condition continued to decline and ultimately he required intubation 11/16/2022 for acute respiratory distress.  Significant Events: 12/19 open umbilical hernia repair with mesh 12/12 abdominal distention, new onset atrial fibrillation with RVR 12/13 acute respiratory distress requiring intubation 12/20 extubated  Consultants:  General Surgery PCCM  Goals of Care:  Code Status: Full Code   DVT prophylaxis: Lovenox  Interim Hx: Afebrile.  Vital signs stable.  Remaining in NSR.  Saturation 99% room air.  Presently tolerating clear liquid diet and moving bowels.  Sitting up in a bedside chair.  In good spirits.  Anxious to advance diet and be discharged home.  Denies chest pain or shortness of breath.  No nausea or vomiting.  Assessment & Plan:  Incarcerated umbilical hernia Status post open repair with mesh  Postoperative ileus Care per General Surgery - diet being advanced w/o difficulty thus far - TNA being weaned   Abdominal wall hematoma Status post evacuation 11/20/2022 -care per General Surgery  Acute newly diagnosed atrial fibrillation with RVR Now back in NSR -TTE noted preserved EF with grade 1 DD  Healthcare acquired pneumonia due to Serratia Has completed 7 days of Zosyn therapy  Obstructive sleep apnea/OHS -chronic hypoxic respiratory failure due to  left diaphragm paralysis Continue usual home BiPAP regimen   Disposition:  from home - anticipate return home when tolerating diet and off TNA   Objective: Blood pressure (!) 119/91, pulse 65, temperature (!) 97.4 F (36.3 C), temperature source Oral, resp. rate 16, height 5\' 11"  (1.803 m), weight 123.5 kg, SpO2 99 %.  Intake/Output Summary (Last 24 hours) at 12/05/2022 1111 Last data filed at 12/04/2022 2300 Gross per 24 hour  Intake 1093.96 ml  Output 300 ml  Net 793.96 ml   Filed Weights   11/30/22 0610 12/01/22 0500 12/02/22 0500  Weight: 127.7 kg 124.6 kg 123.5 kg    Examination: General: No acute respiratory distress Lungs: Clear to auscultation bilaterally without wheezes or crackles Cardiovascular: Regular rate and rhythm without murmur  Abdomen: Nontender, nondistended, soft, bowel sounds positive Extremities: No significant cyanosis, clubbing, or edema bilateral lower extremities  CBC: Recent Labs  Lab 12/02/22 0330 12/03/22 0500 12/05/22 0915  WBC 16.7* 18.7* 18.4*  HGB 10.5* 9.3* 10.5*  HCT 33.2* 31.4* 32.8*  MCV 92.2 96.0 90.1  PLT 339 322 376   Basic Metabolic Panel: Recent Labs  Lab 12/01/22 0357 12/02/22 0330 12/03/22 0623 12/04/22 0002 12/05/22 0915  NA 139   < > 140 139 137  K 4.5   < > 4.6 4.3 3.9  CL 108   < > 110 109 107  CO2 22   < > 23 24 22   GLUCOSE 133*   < > 129* 129* 114*  BUN 35*   < > 34* 28* 26*  CREATININE 1.26*   < > 1.32* 1.08 0.99  CALCIUM 8.7*   < > 9.1 9.2 9.0  MG 2.6*   < > 2.3 2.2 2.1  PHOS 3.3  --   --  2.5 2.4*   < > = values in this interval not displayed.   GFR: Estimated Creatinine Clearance: 108.8 mL/min (by C-G formula based on SCr of 0.99 mg/dL).   Scheduled Meds:  bacitracin   Topical BID   bisacodyl  10 mg Rectal Daily   Chlorhexidine Gluconate Cloth  6 each Topical Daily   docusate sodium  100 mg Oral BID   enoxaparin (LOVENOX) injection  60 mg Subcutaneous Q24H   feeding supplement  1 Container  Oral TID BM   fluticasone  1 spray Each Nare Daily   fluticasone furoate-vilanterol  1 puff Inhalation Daily   metoCLOPramide (REGLAN) injection  5 mg Intravenous Q6H   montelukast  10 mg Oral QHS   [START ON 12/06/2022] multivitamin with minerals  1 tablet Oral Daily   pantoprazole  40 mg Oral Daily   polyethylene glycol  17 g Oral Daily   sodium chloride flush  10-40 mL Intracatheter Q12H   Continuous Infusions:  sodium chloride 250 mL (12/02/22 1028)   TPN ADULT (ION) 85 mL/hr at 12/04/22 1912   TPN ADULT (ION)       LOS: 13 days   Cherene Altes, MD [facility] Office  [phone] Pager - Text Page per Shea Evans  If 7PM-7AM, please contact night-coverage per Amion 12/05/2022, 11:11 AM

## 2022-12-06 DIAGNOSIS — K420 Umbilical hernia with obstruction, without gangrene: Secondary | ICD-10-CM | POA: Diagnosis not present

## 2022-12-06 LAB — GLUCOSE, CAPILLARY
Glucose-Capillary: 100 mg/dL — ABNORMAL HIGH (ref 70–99)
Glucose-Capillary: 107 mg/dL — ABNORMAL HIGH (ref 70–99)
Glucose-Capillary: 81 mg/dL (ref 70–99)
Glucose-Capillary: 87 mg/dL (ref 70–99)
Glucose-Capillary: 90 mg/dL (ref 70–99)

## 2022-12-06 MED ORDER — TRAVASOL 10 % IV SOLN
INTRAVENOUS | Status: DC
  Filled 2022-12-06: qty 792

## 2022-12-06 MED ORDER — METOCLOPRAMIDE HCL 5 MG/ML IJ SOLN
5.0000 mg | Freq: Three times a day (TID) | INTRAMUSCULAR | Status: DC
  Administered 2022-12-06 – 2022-12-07 (×3): 5 mg via INTRAVENOUS
  Filled 2022-12-06 (×3): qty 2

## 2022-12-06 MED ORDER — BENZONATATE 100 MG PO CAPS
100.0000 mg | ORAL_CAPSULE | Freq: Three times a day (TID) | ORAL | Status: DC
  Administered 2022-12-06 – 2022-12-07 (×2): 100 mg via ORAL
  Filled 2022-12-06 (×2): qty 1

## 2022-12-06 NOTE — Progress Notes (Signed)
[facility] Progress Note  6 Days Post-Op  Subjective: Tolerating FLD well.  Ate grits, jello, and broth this am with no complaints.  Passing flatus and moving his bowels well. Up in chair.  Objective: Vital signs in last 24 hours: Temp:  [97.8 F (36.6 C)-98.7 F (37.1 C)] 98.7 F (37.1 C) (12/23 0845) Pulse Rate:  [70-78] 78 (12/23 0845) Resp:  [16] 16 (12/23 0845) BP: (101-120)/(77-84) 101/84 (12/23 0845) SpO2:  [98 %-99 %] 99 % (12/23 0845) FiO2 (%):  [21 %] 21 % (12/22 2139) Last BM Date : 12/05/22  Intake/Output from previous day: 12/22 0701 - 12/23 0700 In: 690.8 [P.O.:240; I.V.:450.8] Out: -  Intake/Output this shift: No intake/output data recorded.  PE: Gen:  Alert, NAD, pleasant Pulm: rate and effort normal Abd: protuberant, soft, nontender, umbilical incision with staples present and some ecchymosis but no erythema or drainage, blisters noted on the left and right lateral abdomen (some open and some remain intact)   Lab Results:  Recent Labs    12/05/22 0915  WBC 18.4*  HGB 10.5*  HCT 32.8*  PLT 376   BMET Recent Labs    12/04/22 0002 12/05/22 0915  NA 139 137  K 4.3 3.9  CL 109 107  CO2 24 22  GLUCOSE 129* 114*  BUN 28* 26*  CREATININE 1.08 0.99  CALCIUM 9.2 9.0   PT/INR No results for input(s): "LABPROT", "INR" in the last 72 hours. CMP     Component Value Date/Time   NA 137 12/05/2022 0915   NA 140 02/24/2022 0937   K 3.9 12/05/2022 0915   CL 107 12/05/2022 0915   CO2 22 12/05/2022 0915   GLUCOSE 114 (H) 12/05/2022 0915   BUN 26 (H) 12/05/2022 0915   BUN 21 02/24/2022 0937   CREATININE 0.99 12/05/2022 0915   CALCIUM 9.0 12/05/2022 0915   PROT 6.8 12/04/2022 0002   PROT 7.3 02/24/2022 0937   ALBUMIN 2.7 (L) 12/04/2022 0002   ALBUMIN 4.7 02/24/2022 0937   AST 50 (H) 12/04/2022 0002   ALT 89 (H) 12/04/2022 0002   ALKPHOS 112 12/04/2022 0002   BILITOT 0.6 12/04/2022 0002   BILITOT 0.3 02/24/2022 0937   GFRNONAA >60  12/05/2022 0915   GFRAA 81 11/16/2019 1040   Lipase     Component Value Date/Time   LIPASE 45 11/22/2022 0607       Studies/Results: No results found.  Anti-infectives: Anti-infectives (From admission, onward)    Start     Dose/Rate Route Frequency Ordered Stop   11/27/22 1600  piperacillin-tazobactam (ZOSYN) IVPB 3.375 g        3.375 g 12.5 mL/hr over 240 Minutes Intravenous Every 8 hours 11/27/22 1525 12/04/22 0941   11/22/22 1400  ceFAZolin (ANCEF) IVPB 3g/100 mL premix        3 g 200 mL/hr over 30 Minutes Intravenous  Once 11/22/22 1202 11/22/22 1245   11/22/22 1208  ceFAZolin (ANCEF) 3-0.9 GM/100ML-% IVPB       Note to Pharmacy: Lear Ng S: cabinet override      11/22/22 1208 11/22/22 1310   11/22/22 1200  ceFAZolin (ANCEF) IVPB 2g/100 mL premix  Status:  Discontinued        2 g 200 mL/hr over 30 Minutes Intravenous Every 8 hours 11/22/22 1149 11/22/22 1202        Assessment/Plan Incarcerated umbilical hernia  Postoperative ileus -[id number] s/p Open umbilical hernia repair with mesh 12/9 Dr. Okey Dupre -POD#6 s/p Evacuation of postoperative abdominal  wall hematoma  12/17 Dr. Robyne Peers - Having bowel function and tolerating FLD - adv to soft diet today  - Continue daily Miralax, but stop suppository due to multiple BMs.  Decrease reglan to 5mg  TID.  Can continue to wean as bowel function continues to improve. - DC TNA today - Apply bacitracin to the staple line and open blisters with nonstick dressing. Abdominal binder.   ID - currently zosyn 12/14>> (for pneumonia) VTE - lovenox FEN - IVF, soft diet, decrease reglan, miralax Foley - none   VDRF - extubated 12/20  Paroxysmal atrial fibrillation/RVR-now in sinus rhythm  AKI  HTN HLD GERD Hx asthma OSA/OHS with known left hemidiaphragm paralysis  Obesity, BMI 38   I reviewed last 24 h vitals and pain scores, last 48 h intake and output, last 24 h labs and trends, and last 24 h imaging results    LOS:  14 days    1/21, [facility] 12/06/2022, 11:24 AM Please see Amion for pager number during day hours 7:00am-4:30pm

## 2022-12-06 NOTE — Progress Notes (Addendum)
PHARMACY - TOTAL PARENTERAL NUTRITION CONSULT NOTE   Indication: Prolonged ileus  Patient Measurements: Height: 5\' 11"  (180.3 cm) Weight: 123.5 kg (272 lb 4.3 oz) IBW/kg (Calculated) : 75.3 TPN AdjBW (KG): 88.9 Body mass index is 37.97 kg/m.  Assessment: Patient presented to ED secondary to complaints of abdominal pain. Patient underwent open umbilical hernia repair with placement of mesh on 12/9 noted to have worsening abdominal distention suspected to be related to ileus and now has worsening respiratory distress and hypoxemia requiring intubation on 12/13.  He also developed atrial fibrillation with RVR . Slow to improve and TPN started 12/15. Remains intubated and difficult to wean.  Glucose / Insulin: BG <120, No hx DM,off SSI Electrolytes: (last labs 12/22) CoCa 10, phos 2.4 others WNL Renal: Scr 0.99 (BL 1-1.2), BUN 26 (stable) Hepatic: Albumin 2.7, AST/ALT 50/89 (decr), TG 212 (ooff propofol), tbili 0.6 Intake / Output: UOP incompletely charted, NG 50 mL (clamped), LBM 12/21 x3 GI Imaging: 12/16 CT abd: Paraumbilical hematoma, bowel distension, concern for ileus 12/18 Abd Xray: Nonobstructive bowel gas pattern w/ decreased distension 12/20 KUB: no evidence of bowel obstruction GI Surgeries / Procedures:  12/17: Evacuation of post-op abdominal wall hematoma  Central access: PICC TPN start date: 12/15  Nutritional Goals: Goal concentrated TPN rate is 85 mL/hr (provides 167 g of protein and CHO 2533 kcals per day)  RD Assessment: Estimated Needs Total Energy Estimated Needs: 2500-2700 Total Protein Estimated Needs: 155-175 grams Total Fluid Estimated Needs: >2.2 L  Current Nutrition:  NPO 12/20: CLD 12/21 CLD had 2 juices, 1 soda, 1 ice pop, 1 jello, no N/V or pain 12/22  advanced to FLD and had vegetable broth for lunch Beeth broth for supper, cranberry juice, strawberry fruit ice cream, tea and ginger ale. Did not drink Boost yet 12/23: ate grits, jello, pudding. Pt  states he will try Boost  Plan:  Stop TPN after current bag runs out per surgery. Orders placed to decrease rate to 28ml/hr for 1 hour at 1700. RN aware.  31m, PharmD, BCPS, BCCP Clinical Pharmacist  Please check AMION for all [facility] phone numbers After 10:00 PM, call Main Pharmacy [phone]

## 2022-12-06 NOTE — Progress Notes (Signed)
Mobility Specialist: Progress Note   12/06/22 1644  Mobility  Activity Ambulated with assistance in hallway  Level of Assistance Contact guard assist, steadying assist  Assistive Device Front wheel walker  Distance Ambulated (ft) 300 ft  Activity Response Tolerated well  Mobility Referral Yes  $Mobility charge 1 Mobility   Received pt in chair having no complaints and agreeable to mobility. Pt was asymptomatic throughout ambulation and returned to room w/o fault. Left in chair w/ call bell in reach and all needs met.  [place] Grover Robinson Mobility Specialist Please contact via SecureChat or Rehab office at [phone]

## 2022-12-06 NOTE — Progress Notes (Signed)
This RN and Dwyane Luo, RN wasted 175 mL of Fentanyl drip in Stericycle waste container but were unable to do so in the pyxis because the drip was pulled from the previous hospital.

## 2022-12-06 NOTE — Progress Notes (Signed)
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   The patient underwent umbilical hernia repair 0000000 with mesh placement.  Postop day 3 he developed abdominal distention as well as atrial fibrillation with RVR.  He required IV Cardizem, amiodarone, digoxin, and placement of an NG tube.  His condition continued to decline and ultimately he required intubation 11/16/2022 for acute respiratory distress.   Significant Events: 0000000 open umbilical hernia repair with mesh 12/12 abdominal distention, new onset atrial fibrillation with RVR 12/13 acute respiratory distress requiring intubation 12/20 extubated    Assessment & Plan:   Incarcerated umbilical hernia -Status post open repair with mesh on 12/9. -Status postevacuation of postoperative abdominal wall hematoma on 12/17 -Tolerating full liquid diet-advance to soft diet today. -Continue daily MiraLAX and Dulcolax suppositories for ileus. -Appreciate general surgery help   Postoperative ileus -Care per General Surgery - diet being advanced to soft diet on 12/23.   -TNA being weaned   Acute on chronic hypoxemic respiratory failure:  -Required intubation on 12/13 -Extubated on 12/20. -Now on room air  Septic shock: Required pressors. -Resolved   New onset atrial fibrillation with RVR -He received IV Cardizem then amiodarone bolus and drip also digoxin. -Now back in NSR -TTE noted preserved EF with grade 1 DD -Evaluated by cardiology.  No need for anticoagulation at this time. -Patient appears to be euvolemic on exam.   Healthcare acquired pneumonia due to Serratia -Has completed 7  days of Zosyn therapy -On room air.  Remained afebrile.   Obstructive sleep apnea/OHS -chronic hypoxic respiratory failure due to restrictive lung disease left diaphragm paralysis -Continue usual home BiPAP regimen  Asthma: Stable -Continue Singulair, Breo Ellipta and ProAir nebs as needed  GERD: Continue PPI  Normocytic anemia: H&H is stable.  Continue to monitor  AKI: Resolved.  Incidental 4 mm right lung nodule: -Outpatient follow-up recommended.  Class III obesity with a BMI of 39: -Recommend diet modification and weight loss  hypertension: Home BP meds are on hold.  BP runs within normal limits   DVT prophylaxis: Lovenox Code Status: Full code Family Communication:  None present at bedside.  Plan of care discussed with patient in length and he verbalized understanding and agreed with it. Disposition Plan: Home  Consultants:  PCCM General surgery  Procedures:  's see above  Antimicrobials:  Zosyn  Status is: Inpatient    Subjective: Patient seen and examined.  Sitting comfortably on recliner.  Reports that he feels better this morning.  Able to tolerate full liquid diet, his diet has been advanced to soft diet today.  Remained afebrile.  Denies chest pain, shortness of breath, nausea, vomiting.  No acute events overnight. Objective: Vitals:   12/05/22 2110 12/06/22 0554 12/06/22 0842 12/06/22 0845  BP: 119/77 120/82  101/84  Pulse: 71 70  78  Resp:    16  Temp: 97.8 F (36.6 C)   98.7 F (37.1 C)  TempSrc: Oral   Oral  SpO2: 98% 98% 98% 99%  Weight:      Height:        Intake/Output Summary (Last 24 hours) at 12/06/2022 0916 Last data filed at 12/06/2022 0205 Gross per  24 hour  Intake 690.77 ml  Output --  Net 690.77 ml   Filed Weights   11/30/22 0610 12/01/22 0500 12/02/22 0500  Weight: 127.7 kg 124.6 kg 123.5 kg    Examination:  General exam: Appears calm and comfortable, sitting comfortably on recliner, on room air, communicating  well Respiratory system: Clear to auscultation. Respiratory effort normal. Cardiovascular system: S1 & S2 heard, RRR. No JVD, murmurs, rubs, gallops or clicks. No pedal edema. Gastrointestinal system: Abdomen is nondistended, soft and nontender. No organomegaly or masses felt. Normal bowel sounds heard. Central nervous system: Alert and oriented. No focal neurological deficits. Extremities: Symmetric 5 x 5 power. Skin: No rashes, lesions or ulcers Psychiatry: Judgement and insight appear normal. Mood & affect appropriate.    Data Reviewed: I have personally reviewed following labs and imaging studies  CBC: Recent Labs  Lab 11/30/22 0508 12/01/22 0357 12/02/22 0330 12/03/22 0500 12/05/22 0915  WBC 14.6* 17.7* 16.7* 18.7* 18.4*  HGB 10.5* 10.8* 10.5* 9.3* 10.5*  HCT 32.5* 33.8* 33.2* 31.4* 32.8*  MCV 89.5 90.6 92.2 96.0 90.1  PLT 331 356 339 322 376   Basic Metabolic Panel: Recent Labs  Lab 11/30/22 0508 12/01/22 0357 12/02/22 0330 12/03/22 0623 12/04/22 0002 12/05/22 0915  NA 138 139 139 140 139 137  K 4.3 4.5 4.6 4.6 4.3 3.9  CL 107 108 108 110 109 107  CO2 23 22 23 23 24 22   GLUCOSE 140* 133* 119* 129* 129* 114*  BUN 39* 35* 40* 34* 28* 26*  CREATININE 1.16 1.26* 1.41* 1.32* 1.08 0.99  CALCIUM 8.5* 8.7* 9.1 9.1 9.2 9.0  MG 3.1* 2.6* 2.4 2.3 2.2 2.1  PHOS 3.2 3.3  --   --  2.5 2.4*   GFR: Estimated Creatinine Clearance: 108.8 mL/min (by C-G formula based on SCr of 0.99 mg/dL). Liver Function Tests: Recent Labs  Lab 11/30/22 0508 12/01/22 0357 12/02/22 0330 12/03/22 0623 12/04/22 0002  AST 95* 60* 66* 65* 50*  ALT 105* 92* 101* 103* 89*  ALKPHOS 89 100 121 114 112  BILITOT 0.4 0.6 0.5 0.5 0.6  PROT 6.7 7.0 7.1 6.8 6.8  ALBUMIN 2.9* 3.0* 3.0* 2.7* 2.7*   No results for input(s): "LIPASE", "AMYLASE" in the last 168 hours. No results for input(s): "AMMONIA" in the last 168 hours. Coagulation Profile: No results for input(s): "INR", "PROTIME" in the last  168 hours. Cardiac Enzymes: No results for input(s): "CKTOTAL", "CKMB", "CKMBINDEX", "TROPONINI" in the last 168 hours. BNP (last 3 results) No results for input(s): "PROBNP" in the last 8760 hours. HbA1C: No results for input(s): "HGBA1C" in the last 72 hours. CBG: Recent Labs  Lab 12/04/22 1731 12/05/22 0036 12/05/22 0443 12/06/22 0047 12/06/22 0554  GLUCAP 121* 98 106* 90 107*   Lipid Profile: Recent Labs    12/04/22 0002  TRIG 212*   Thyroid Function Tests: No results for input(s): "TSH", "T4TOTAL", "FREET4", "T3FREE", "THYROIDAB" in the last 72 hours. Anemia Panel: No results for input(s): "VITAMINB12", "FOLATE", "FERRITIN", "TIBC", "IRON", "RETICCTPCT" in the last 72 hours. Sepsis Labs: No results for input(s): "PROCALCITON", "LATICACIDVEN" in the last 168 hours.  Recent Results (from the past 240 hour(s))  Culture, Respiratory w Gram Stain     Status: None   Collection Time: 11/26/22  3:58 PM   Specimen: Tracheal Aspirate; Respiratory  Result Value Ref Range Status   Specimen Description   Final    TRACHEAL ASPIRATE Performed at [facility], [address] [place], [place]  Special Requests   Final    NONE Performed at [facility], [address] [place], [place] [address]    Gram Stain   Final    RARE WBC PRESENT, PREDOMINANTLY PMN MODERATE GRAM NEGATIVE RODS    Culture   Final    ABUNDANT SERRATIA MARCESCENS FEW KLUYVERA ASCORBATA NO STAPHYLOCOCCUS AUREUS ISOLATED No Pseudomonas species isolated Performed at [facility] [address], [place], [place] [address]    Report Status 11/30/2022 FINAL  Final   Organism ID, Bacteria SERRATIA MARCESCENS  Final   Organism ID, Bacteria KLUYVERA ASCORBATA  Final      Susceptibility   Kluyvera ascorbata - MIC*    AMPICILLIN 16 INTERMEDIATE Intermediate     CEFAZOLIN >=64 RESISTANT Resistant     CEFEPIME <=0.12 SENSITIVE Sensitive     CEFTAZIDIME <=1 SENSITIVE Sensitive     CEFTRIAXONE  8 RESISTANT Resistant     CIPROFLOXACIN <=0.25 SENSITIVE Sensitive     GENTAMICIN <=1 SENSITIVE Sensitive     IMIPENEM <=0.25 SENSITIVE Sensitive     TRIMETH/SULFA <=20 SENSITIVE Sensitive     AMPICILLIN/SULBACTAM <=2 SENSITIVE Sensitive     PIP/TAZO <=4 SENSITIVE Sensitive     * FEW KLUYVERA ASCORBATA   Serratia marcescens - MIC*    CEFAZOLIN >=64 RESISTANT Resistant     CEFEPIME <=0.12 SENSITIVE Sensitive     CEFTAZIDIME <=1 SENSITIVE Sensitive     CEFTRIAXONE <=0.25 SENSITIVE Sensitive     CIPROFLOXACIN <=0.25 SENSITIVE Sensitive     GENTAMICIN <=1 SENSITIVE Sensitive     TRIMETH/SULFA <=20 SENSITIVE Sensitive     * ABUNDANT SERRATIA MARCESCENS  Culture, blood (Routine X 2) w Reflex to ID Panel     Status: None   Collection Time: 11/29/22  8:29 AM   Specimen: BLOOD LEFT HAND  Result Value Ref Range Status   Specimen Description   Final    BLOOD LEFT HAND BOTTLES DRAWN AEROBIC AND ANAEROBIC   Special Requests   Final    Blood Culture results may not be optimal due to an excessive volume of blood received in culture bottles   Culture   Final    NO GROWTH 5 DAYS Performed at [facility], [address] [place], [place] [address]    Report Status 12/04/2022 FINAL  Final  Culture, blood (Routine X 2) w Reflex to ID Panel     Status: None   Collection Time: 11/29/22  8:29 AM   Specimen: BLOOD LEFT WRIST  Result Value Ref Range Status   Specimen Description BLOOD LEFT WRIST AEROBIC BOTTLE ONLY  Final   Special Requests Blood Culture adequate volume  Final   Culture   Final    NO GROWTH 5 DAYS Performed at [facility], [address], [place], [place] [address]    Report Status 12/04/2022 FINAL  Final      Radiology Studies: No results found.  Scheduled Meds:  bacitracin   Topical BID   bisacodyl  10 mg Rectal Daily   Chlorhexidine Gluconate Cloth  6 each Topical Daily   docusate sodium  100 mg Oral BID   enoxaparin (LOVENOX) injection  60 mg Subcutaneous Q24H    feeding supplement  1 Container Oral TID BM   fluticasone  1 spray Each Nare Daily   fluticasone furoate-vilanterol  1 puff Inhalation Daily   metoCLOPramide (REGLAN) injection  5 mg Intravenous Q6H   montelukast  10 mg Oral QHS   multivitamin with minerals  1 tablet  Oral Daily   pantoprazole  40 mg Oral Daily   polyethylene glycol  17 g Oral Daily   sodium chloride flush  10-40 mL Intracatheter Q12H   Continuous Infusions:  sodium chloride 250 mL (12/02/22 1028)   TPN ADULT (ION) 55 mL/hr at 12/06/22 0205     LOS: 14 days   Time spent: 35 minutes   Spence Soberano Loann Quill, MD [facility]  If 7PM-7AM, please contact night-coverage [email or web address] 12/06/2022, 9:16 AM

## 2022-12-07 DIAGNOSIS — K420 Umbilical hernia with obstruction, without gangrene: Secondary | ICD-10-CM | POA: Diagnosis not present

## 2022-12-07 LAB — CBC
HCT: 29.5 % — ABNORMAL LOW (ref 39.0–52.0)
Hemoglobin: 9.7 g/dL — ABNORMAL LOW (ref 13.0–17.0)
MCH: 28.9 pg (ref 26.0–34.0)
MCHC: 32.9 g/dL (ref 30.0–36.0)
MCV: 87.8 fL (ref 80.0–100.0)
Platelets: 336 10*3/uL (ref 150–400)
RBC: 3.36 MIL/uL — ABNORMAL LOW (ref 4.22–5.81)
RDW: 12.9 % (ref 11.5–15.5)
WBC: 13.5 10*3/uL — ABNORMAL HIGH (ref 4.0–10.5)
nRBC: 0 % (ref 0.0–0.2)

## 2022-12-07 MED ORDER — POLYETHYLENE GLYCOL 3350 17 G PO PACK: 17.0000 g | PACK | Freq: Every day | ORAL | 0 refills | Status: DC | PRN

## 2022-12-07 MED ORDER — ACETAMINOPHEN 325 MG PO TABS: 650.0000 mg | ORAL_TABLET | Freq: Four times a day (QID) | ORAL | Status: AC | PRN

## 2022-12-07 NOTE — Discharge Instructions (Signed)
   OPEN ABDOMINAL SURGERY: POST OP INSTRUCTIONS  Always review your discharge instruction sheet given to you by the facility where your surgery was performed.   A prescription for pain medication may be given to you upon discharge.  Take your pain medication as prescribed, if needed.  If narcotic pain medicine is not needed, then you may take acetaminophen (Tylenol) or ibuprofen (Advil) as needed. Take your usually prescribed medications unless otherwise directed. If you need a refill on your pain medication, please contact your pharmacy. They will contact our office to request authorization.  Prescriptions will not be filled after 5pm or on week-ends. You should follow a light diet the first few days after arrival home, such as soup and crackers, pudding, etc.unless your doctor has advised otherwise. A high-fiber, low fat diet can be resumed as tolerated.   Be sure to include lots of fluids daily. Most patients will experience some swelling and bruising on the chest and neck area.  Ice packs will help.  Swelling and bruising can take several days to resolve Most patients will experience some swelling and bruising in the area of the incision. Ice pack will help. Swelling and bruising can take several days to resolve..  It is common to experience some constipation if taking pain medication after surgery.  Increasing fluid intake and taking a stool softener will usually help or prevent this problem from occurring.  A mild laxative (Milk of Magnesia or Miralax) should be taken according to package directions if there are no bowel movements after 48 hours.  You may have steri-strips (small skin tapes) in place directly over the incision.  These strips should be left on the skin for 7-10 days.  If your surgeon used skin glue on the incision, you may shower in 24 hours.  The glue will flake off over the next 2-3 weeks.  Any sutures or staples will be removed at the office during your follow-up visit. You may  find that a light gauze bandage over your incision may keep your staples from being rubbed or pulled. You may shower and replace the bandage daily. ACTIVITIES:  You may resume regular (light) daily activities beginning the next day--such as daily self-care, walking, climbing stairs--gradually increasing activities as tolerated.  You may have sexual intercourse when it is comfortable.  Refrain from any heavy lifting or straining until approved by your doctor. You may drive when you no longer are taking prescription pain medication, you can comfortably wear a seatbelt, and you can safely maneuver your car and apply brakes Return to Work: ___________________________________ Kyle Burns should see your doctor in the office for a follow-up appointment approximately two weeks after your surgery.  Make sure that you call for this appointment within a day or two after you arrive home to insure a convenient appointment time. OTHER INSTRUCTIONS:  _____________________________________________________________ _____________________________________________________________  WHEN TO CALL YOUR DOCTOR: Fever over 101.0 Inability to urinate Nausea and/or vomiting Extreme swelling or bruising Continued bleeding from incision. Increased pain, redness, or drainage from the incision. Difficulty swallowing or breathing Muscle cramping or spasms. Numbness or tingling in hands or feet or around lips.

## 2022-12-07 NOTE — Discharge Summary (Signed)
Physician Discharge Summary  Kyle Burns [id number] DOB: 02-25-1966 DOA: 11/22/2022  PCP: Janora Norlander, DO  Admit date: 11/22/2022 Discharge date: 12/07/2022  Admitted From: Home Disposition:  Home  Recommendations for Outpatient Follow-up:  Follow up with PCP in 1-2 weeks Follow up with general surgery outpatient on 12/26 for staple removal Monitor your blood pressure at home and follow-up with your PCP regarding your antihypertensive medications  Home Health:none  Equipment/Devices:none  Discharge Condition:stable  CODE STATUS:Full code  Diet recommendation:   Brief/Interim Summary: 56 year old with a history of HTN, OSA/OHS on BiPAP, 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   The patient underwent umbilical hernia repair 0000000 with mesh placement.  Postop day 3 he developed abdominal distention as well as atrial fibrillation with RVR.  He required IV Cardizem, amiodarone, digoxin, and placement of an NG tube.  His condition continued to decline and ultimately he required intubation 11/16/2022 for acute respiratory distress.   Significant Events: 0000000 open umbilical hernia repair with mesh 12/12 abdominal distention, new onset atrial fibrillation with RVR 12/13 acute respiratory distress requiring intubation 12/20 extubated  Incarcerated umbilical hernia -Status post open repair with mesh on 12/9. -Status postevacuation of postoperative abdominal wall hematoma on 12/17 -Patient tolerated soft diet.  TPN discontinued on 12/23.  Okay to discharge from general surgery point.   Postoperative ileus -Care per General Surgery - diet being advanced to soft diet on 12/23.   -T PN discontinued on 12/23.  Had regular bowel movement.  Acute on chronic hypoxemic respiratory failure:  -Required intubation  on 12/13 -Extubated on 12/20. -Remained on room air since then   Septic shock: Required pressors. -Resolved   New onset atrial fibrillation with RVR -He received IV Cardizem then amiodarone bolus and drip also digoxin. -Now back in NSR -TTE noted preserved EF with grade 1 DD -Evaluated by cardiology.  No need for anticoagulation at this time. -Patient appears to be euvolemic on exam.   Healthcare acquired pneumonia due to Serratia -Has completed 7 days of Zosyn therapy -On room air.  Remained afebrile.   Obstructive sleep apnea/OHS -chronic hypoxic respiratory failure due to restrictive lung disease left diaphragm paralysis -Continued usual home BiPAP regimen   Asthma: Stable -Continued Singulair, Breo Ellipta and ProAir nebs as needed   GERD: Continued PPI   Normocytic anemia: H&H remained stable  AKI: Resolved.   Incidental 4 mm right lung nodule: -Outpatient follow-up recommended.   Class III obesity with a BMI of 39: -Recommend diet modification and weight loss   hypertension: Patient's blood pressure remained on lower range without being on home BP medicine while in the hospital.  Will continue to hold at the time of discharge.  Recommend to monitor BP at home and follow-up with PCP and resume if needed.  discharge Diagnoses:  Incarcerated umbilical hernia Postoperative ileus Acute on chronic hypoxemic respiratory failure Septic shock New onset A-fib with RVR Healthcare acquired pneumonia due to Serratia Obstructive sleep apnea/OHS Chronic hypoxemic respiratory failure due to restrictive lung disease secondary to left diaphragm paralysis Asthma GERD Normocytic anemia AKI Incidental right lung nodule Class III obesity with BMI of 39 Hypertension   Discharge Instructions  Discharge Instructions     Diet - low sodium heart healthy   Complete by: As directed    Discharge wound care:   Complete by: As directed  As above   Increase activity slowly    Complete by: As directed       Allergies as of 12/07/2022   No Known Allergies      Medication List     TAKE these medications    acetaminophen 325 MG tablet Commonly known as: TYLENOL Take 2 tablets (650 mg total) by mouth every 6 (six) hours as needed for mild pain, fever or headache.   albuterol 108 (90 Base) MCG/ACT inhaler Commonly known as: Ventolin HFA USE 2 PUFFS EVERY 6 HOURS AS NEEDED FOR WHEEZING (1 for home, 1 for work) What changed:  how much to take how to take this when to take this reasons to take this additional instructions   aspirin EC 81 MG tablet Take 162 mg by mouth in the morning.   atorvastatin 10 MG tablet Commonly known as: LIPITOR Take 1 tablet (10 mg total) by mouth daily. What changed: when to take this   budesonide-formoterol 80-4.5 MCG/ACT inhaler Commonly known as: Symbicort Inhale 2 puffs into the lungs 2 (two) times daily. What changed:  when to take this reasons to take this   esomeprazole 40 MG capsule Commonly known as: NexIUM Take 1 capsule (40 mg total) by mouth daily. What changed: when to take this   famotidine 20 MG tablet Commonly known as: Pepcid One after supper What changed:  how much to take how to take this when to take this additional instructions   FLUoxetine 20 MG capsule Commonly known as: PROZAC TAKE ONE (1) CAPSULE EACH DAY What changed:  how much to take how to take this when to take this additional instructions   fluticasone 50 MCG/ACT nasal spray Commonly known as: FLONASE Place 2 sprays into both nostrils daily. What changed: when to take this   furosemide 20 MG tablet Commonly known as: LASIX TAKE ONE (1) TABLET EACH DAY What changed:  how much to take how to take this when to take this additional instructions   levocetirizine 5 MG tablet Commonly known as: XYZAL Take 1 tablet (5 mg total) by mouth every evening. To replace Zyrtec What changed:  when to take this additional  instructions   losartan-hydrochlorothiazide 100-25 MG tablet Commonly known as: HYZAAR Take 1 tablet by mouth daily. What changed: when to take this   montelukast 10 MG tablet Commonly known as: SINGULAIR Take 1 tablet (10 mg total) by mouth at bedtime.   One-A-Day Mens 50+ Tabs Take 1 tablet by mouth in the morning.   polyethylene glycol 17 g packet Commonly known as: MIRALAX / GLYCOLAX Take 17 g by mouth daily as needed.   spironolactone 25 MG tablet Commonly known as: ALDACTONE TAKE ONE (1) TABLET EACH DAY What changed:  how much to take how to take this when to take this additional instructions               Discharge Care Instructions  (From admission, onward)           Start     Ordered   12/07/22 0000  Discharge wound care:       Comments: As above   12/07/22 0949            Follow-up Information     Theora Gianotti, NP Follow up on 12/29/2022.   Specialties: Cardiology, Radiology Why: Cardiology Hospital Follow-up on 12/29/2022 at 2:00 PM. Contact information: [place] [address] [phone]         Pappayliou, [name]  A, DO Follow up on 12/09/2022.   Specialty: General Surgery Why: to get follow up and to arrange for staple removal Contact information: [address] [place] [facility] [id number] [phone]                No Known Allergies  Consultations: General surgery   Procedures/Studies: DG Abd 1 View  Result Date: 12/03/2022 CLINICAL DATA:  OG tube placement. EXAM: ABDOMEN - 1 VIEW COMPARISON:  12/01/2022. FINDINGS: The bowel gas pattern is normal. An enteric tube terminates in the stomach. No radio-opaque calculi or other significant radiographic abnormality are seen. IMPRESSION: 1. No evidence of bowel obstruction. 2. Enteric tube terminates in the stomach. Electronically Signed   By: Brett Fairy M.D.   On: 12/03/2022 04:34   DG Chest Port 1 View  Result Date:  12/02/2022 CLINICAL DATA:  Acute respiratory failure. Status post surgery for small bowel obstruction due to incarcerated umbilical hernia. Intubated on 11/26/2022. History of paralyzed left hemidiaphragm. EXAM: PORTABLE CHEST 1 VIEW COMPARISON:  AP chest 11/29/2022, 11/28/2022, 02/08/2015 FINDINGS: Endotracheal tube tip terminates approximately 3.5 cm above the carina. Right upper extremity PICC tip is visualized to the superior vena cava/right atrial junction, although the tip is again not well delineated likely due to motion artifact. Enteric tube descends below the diaphragm enters the stomach within left upper quadrant with the tip excluded by inferior collimation. There is moderate elevation of the left hemidiaphragm, unchanged from multiple prior radiographs including 02/08/2015. Bibasilar bronchovascular crowding. Unchanged possible small left pleural effusion. No pneumothorax. No acute skeletal abnormality. IMPRESSION: 1. Endotracheal tube tip terminates approximately 3.5 cm above the carina. 2. Low lung volumes with chronic elevation of the left hemidiaphragm and bibasilar subsegmental atelectasis. It is again difficult to exclude a small left pleural effusion. Electronically Signed   By: Yvonne Kendall M.D.   On: 12/02/2022 08:27   DG Abd 2 Views  Result Date: 12/01/2022 CLINICAL DATA:  Postoperative ileus EXAM: ABDOMEN - 2 VIEW COMPARISON:  Portable exam at 1113 hrs compared to 11/27/2022 FINDINGS: Nasogastric tube projects over stomach. Nonobstructive bowel gas with air-filled nondistended loops of bowel throughout the abdomen. Small amount of stool in RIGHT colon. Decreased bowel distension since previous exam. No definite bowel dilatation/obstruction. Elevation of LEFT diaphragm. IMPRESSION: Nonobstructive bowel gas pattern with decreased bowel distension since previous study. Electronically Signed   By: Lavonia Dana M.D.   On: 12/01/2022 11:27   CT ABDOMEN PELVIS WO CONTRAST  Result Date:  11/29/2022 CLINICAL DATA:  56 year old male with abdominal pain following umbilical hernia repair on 11/22/2022. Hematoma overlying the umbilical hernia repair. EXAM: CT ABDOMEN AND PELVIS WITHOUT CONTRAST TECHNIQUE: Multidetector CT imaging of the abdomen and pelvis was performed following the standard protocol without IV contrast. RADIATION DOSE REDUCTION: This exam was performed according to the departmental dose-optimization program which includes automated exposure control, adjustment of the mA and/or kV according to patient size and/or use of iterative reconstruction technique. COMPARISON:  11/22/2022 CT FINDINGS: Please note that parenchymal and vascular abnormalities may be missed as intravenous contrast was not administered. Lower chest: RIGHT LOWER lobe consolidation/atelectasis noted. Hepatobiliary: Hepatic steatosis identified without focal hepatic abnormality. The gallbladder is unremarkable. There is no evidence of intrahepatic or extrahepatic biliary dilatation. Pancreas: Unremarkable Spleen: Unremarkable Adrenals/Urinary Tract: The kidneys and adrenal glands are unremarkable. Foley catheter is present within the bladder. Stomach/Bowel: An NG tube is present within the mid stomach. There are mildly distended proximal and mid small bowel loops with nondistended distal small  bowel loops. No discrete transition point is identified. The appendix is unremarkable. No definite bowel wall thickening identified. Vascular/Lymphatic: No significant vascular findings are present. No enlarged abdominal or pelvic lymph nodes. Reproductive: Unremarkable Other: A 9.1 x 6.6 x 11.5 cm high density structure in the periumbilical subcutaneous tissues is compatible with a hematoma. Small bowel loops are noted extending to the undersurface of the abdominal wall in this area, but it is difficult to determine if there is recurrence of hernia in this area. There is no evidence of discrete transition change of bowel in this  region however. No evidence of ascites, pneumoperitoneum or other focal collection. Musculoskeletal: Bilateral inguinal hernia repair noted with moderate RIGHT inguinal hernia containing fat again noted. IMPRESSION: 1. 9.1 x 6.6 x 0000000 cm paraumbilical subcutaneous hematoma. Small bowel loops are noted extending to the undersurface of the abdominal wall in this area, but it is difficult to determine if there is recurrence of hernia in this area. No evidence of discrete transition change of bowel in this region however. Consider follow-up CT with enteric contrast, as clinically indicated. 2. Mildly distended proximal and mid small bowel loops with nondistended distal small bowel loops. No discrete transition point identified. Favor ileus over low-grade partial small bowel obstruction. 3. RIGHT LOWER lobe consolidation/atelectasis. 4. Hepatic steatosis. 5. Bilateral inguinal hernia repair with moderate RIGHT inguinal hernia containing fat. Electronically Signed   By: Margarette Canada M.D.   On: 11/29/2022 13:20   DG CHEST PORT 1 VIEW  Result Date: 11/29/2022 CLINICAL DATA:  Respiratory failure EXAM: PORTABLE CHEST 1 VIEW COMPARISON:  11/28/2022 and prior studies FINDINGS: This is a low volume study. Endotracheal tube with tip 2.5 cm above the carina, SUPERIOR cavoatrial junction and enteric tube with tip difficult to visualize. Significant decrease in LEFT LOWER lung opacities and probable small LEFT effusion noted. There is no evidence of pneumothorax. No acute bony abnormalities are identified. IMPRESSION: Significant decrease in LEFT LOWER lung opacities and probable small LEFT effusion. Electronically Signed   By: Margarette Canada M.D.   On: 11/29/2022 09:12   DG Chest Port 1 View  Result Date: 11/28/2022 CLINICAL DATA:  5626 with ventilator dependent respiratory failure and ARF. EXAM: PORTABLE CHEST 1 VIEW COMPARISON:  Portable chest 11/26/2022 FINDINGS: 5:26 a.m. ETT tip is 3 cm from the carina. NGT passes into  the stomach but neither the tip or side hole are filmed. Mild cardiomegaly. There is increased central vascular prominence but no overt edema. There is increased small to moderate left pleural effusion and overlying consolidation or atelectasis in the left lower lobe. The remaining hypoexpanded lungs are generally clear. There is stable mediastinal widening. New right PICC in place terminates at about the superior cavoatrial junction. There are overlying monitor wires. IMPRESSION: 1. Increased small to moderate left pleural effusion and overlying consolidation or atelectasis in the left lower lobe. 2. Increased central vascular prominence but no overt edema. 3. Stable mediastinal widening. 4. Support apparatus as above. Electronically Signed   By: Telford Nab M.D.   On: 11/28/2022 06:26   DG Abd 2 Views  Result Date: 11/27/2022 CLINICAL DATA:  [id number] Ileus, postoperative ([place]) [id number] EXAM: ABDOMEN - 2 VIEW COMPARISON:  X-ray abdomen 11/26/2022 FINDINGS: Enteric tube with tip overlying the expected region of the gastric lumen. Persistent gaseous dilatation of the large and small bowel. Limited evaluation for free air. Right hemidiaphragm collimated off view on the semi upright view. No radio-opaque calculi or other significant radiographic abnormality is seen.  IMPRESSION: 1. Enteric tube in good position. 2. Persistent gaseous dilatation of the small and large bowel consistent with known ileus. Electronically Signed   By: Tish Frederickson M.D.   On: 11/27/2022 13:38   [place] EKG SITE RITE  Result Date: 11/27/2022 If Site Rite image not attached, placement could not be confirmed due to current cardiac rhythm.  DG Abd Portable 1V  Result Date: 11/26/2022 CLINICAL DATA:  NG tube placement. EXAM: PORTABLE ABDOMEN - 1 VIEW COMPARISON:  11/25/2022 FINDINGS: The NG tube tip is in the body region of the stomach. Improved bowel gas pattern with less distention of the small bowel. IMPRESSION: NG tube tip is in the  body region of the stomach. Electronically Signed   By: Rudie Meyer M.D.   On: 11/26/2022 11:08   DG Chest Portable 1 View  Result Date: 11/26/2022 CLINICAL DATA:  Et tube placement EXAM: PORTABLE CHEST - 1 VIEW COMPARISON:  Earlier film of the same day FINDINGS: Endotracheal tube has been placed, tip 3.1 cm above carina. Nasogastric tube loops in the stomach which is incompletely decompressed. Low lung volumes with linear scarring or atelectasis at the right lung base as before. Elevated left hemidiaphragm as before. Heart size and mediastinal contours are within normal limits. No effusion. Visualized bones unremarkable. IMPRESSION: Endotracheal tube tip 3.1 cm above the carina. Electronically Signed   By: Corlis Leak M.D.   On: 11/26/2022 08:23   DG CHEST PORT 1 VIEW  Result Date: 11/26/2022 CLINICAL DATA:  Increasing oxygen demand EXAM: PORTABLE CHEST 1 VIEW COMPARISON:  Yesterday FINDINGS: Enteric tube with tip reaching the stomach. Gas distended stomach also seen on prior. Low volume chest with streaky density at the medial right base. Normal heart size and stable mediastinal contours. No effusion or pneumothorax. IMPRESSION: 1. Continued elevation of the left diaphragm with gas distended stomach. Please correlate for enteric tube function, the catheter reaches the stomach. 2. Low volume chest with mild atelectasis. Electronically Signed   By: Tiburcio Pea M.D.   On: 11/26/2022 05:05   DG Chest Port 1 View  Result Date: 11/25/2022 CLINICAL DATA:  Enteric catheter placement EXAM: PORTABLE CHEST 1 VIEW COMPARISON:  09/15/2022, 11/25/2022 FINDINGS: Frontal view of the chest demonstrates enteric catheter passing below diaphragm, tip excluded by collimation. Cardiac silhouette is unremarkable. Low lung volumes, with chronic elevation of the left hemidiaphragm. No airspace disease, effusion, or pneumothorax. IMPRESSION: 1. Terri catheter passes below diaphragm, tip excluded by collimation. 2. Low  lung volumes. Electronically Signed   By: Sharlet Salina M.D.   On: 11/25/2022 22:11   DG Abd 1 View  Result Date: 11/25/2022 CLINICAL DATA:  Postop ileus EXAM: ABDOMEN - 1 VIEW COMPARISON:  CT 11/22/2022 FINDINGS: Mild gaseous distention of the stomach. Multiple gas dilated small bowel loops in the upper abdomen. Moderate gas and fecal material in the nondilated proximal colon and rectum. IMPRESSION: Multiple gas dilated small bowel loops suggesting ileus or partial obstruction. Electronically Signed   By: Corlis Leak M.D.   On: 11/25/2022 14:36   ECHOCARDIOGRAM COMPLETE  Result Date: 11/25/2022    ECHOCARDIOGRAM REPORT   Patient Name:   KYNDAL GLOSTER Date of Exam: 11/25/2022 Medical Rec #:  [id number]        Height:       71.0 in Accession #:    [id number]       Weight:       282.9 lb Date of Birth:  [birth year]  BSA:          2.443 m Patient Age:    56 years         BP:           108/78 mmHg Patient Gender: M                HR:           72 bpm. [facility] Procedure: 2D Echo, Cardiac Doppler, Color Doppler and Intracardiac            Opacification Agent Indications:    Atrial Fibrillaiton  History:        Patient has prior history of Echocardiogram examinations, most                 recent 04/23/2018. COPD, Arrythmias:RBBB,                 Signs/Symptoms:Shortness of Breath; Risk Factors:Hypertension                 and Dyslipidemia.  Sonographer:    Mikki Harbororothy Buchanan Referring Phys: [phone] CARLOS MADERA  Sonographer Comments: Patient is obese. Image acquisition challenging due to COPD. IMPRESSIONS  1. Left ventricular ejection fraction, by estimation, is 70 to 75%. The left ventricle has hyperdynamic function. The left ventricle has no regional wall motion abnormalities. There is mild left ventricular hypertrophy. Left ventricular diastolic parameters are consistent with Grade I diastolic dysfunction (impaired relaxation).  2. Right ventricular systolic function is normal. The right  ventricular size is normal. Tricuspid regurgitation signal is inadequate for assessing PA pressure.  3. The mitral valve is grossly normal, mildly calcified. Trivial mitral valve regurgitation.  4. The aortic valve is tricuspid. Aortic valve regurgitation is not visualized. Aortic valve mean gradient measures 4.0 mmHg.  5. Unable to estimate CVP. Comparison(s): No prior Echocardiogram. FINDINGS  Left Ventricle: Left ventricular ejection fraction, by estimation, is 70 to 75%. The left ventricle has hyperdynamic function. The left ventricle has no regional wall motion abnormalities. Definity contrast agent was given IV to delineate the left ventricular endocardial borders. The left ventricular internal cavity size was normal in size. There is mild left ventricular hypertrophy. Left ventricular diastolic parameters are consistent with Grade I diastolic dysfunction (impaired relaxation). Right Ventricle: The right ventricular size is normal. No increase in right ventricular wall thickness. Right ventricular systolic function is normal. Tricuspid regurgitation signal is inadequate for assessing PA pressure. Left Atrium: Left atrial size was normal in size. Right Atrium: Right atrial size was normal in size. Pericardium: There is no evidence of pericardial effusion. Mitral Valve: The mitral valve is grossly normal. There is mild calcification of the mitral valve leaflet(s). Trivial mitral valve regurgitation. MV peak gradient, 2.7 mmHg. The mean mitral valve gradient is 1.0 mmHg. Tricuspid Valve: The tricuspid valve is grossly normal. Tricuspid valve regurgitation is trivial. Aortic Valve: The aortic valve is tricuspid. There is mild aortic valve annular calcification. Aortic valve regurgitation is not visualized. Aortic valve mean gradient measures 4.0 mmHg. Aortic valve peak gradient measures 9.0 mmHg. Aortic valve area, by  VTI measures 2.57 cm. Pulmonic Valve: The pulmonic valve was not well visualized. Pulmonic  valve regurgitation is trivial. Aorta: The aortic root is normal in size and structure. Venous: Unable to estimate CVP. The inferior vena cava was not well visualized. IAS/Shunts: The interatrial septum was not well visualized.  LEFT VENTRICLE PLAX 2D LVIDd:         4.40 cm  Diastology LVIDs:         2.40 cm   LV e' medial:    6.53 cm/s LV PW:         1.10 cm   LV E/e' medial:  10.0 LV IVS:        1.20 cm   LV e' lateral:   7.29 cm/s LVOT diam:     2.00 cm   LV E/e' lateral: 9.0 LV SV:         79 LV SV Index:   32 LVOT Area:     3.14 cm  RIGHT VENTRICLE RV Basal diam:  3.70 cm RV Mid diam:    3.60 cm RV S prime:     10.00 cm/s TAPSE (M-mode): 2.5 cm LEFT ATRIUM             Index        RIGHT ATRIUM           Index LA diam:        3.30 cm 1.35 cm/m   RA Area:     17.30 cm LA Vol (A2C):   51.0 ml 20.87 ml/m  RA Volume:   51.00 ml  20.87 ml/m LA Vol (A4C):   44.8 ml 18.34 ml/m LA Biplane Vol: 48.1 ml 19.69 ml/m  AORTIC VALVE                    PULMONIC VALVE AV Area (Vmax):    2.72 cm     PV Vmax:       1.47 m/s AV Area (Vmean):   3.18 cm     PV Peak grad:  8.6 mmHg AV Area (VTI):     2.57 cm AV Vmax:           150.00 cm/s AV Vmean:          87.000 cm/s AV VTI:            0.308 m AV Peak Grad:      9.0 mmHg AV Mean Grad:      4.0 mmHg LVOT Vmax:         130.00 cm/s LVOT Vmean:        88.200 cm/s LVOT VTI:          0.252 m LVOT/AV VTI ratio: 0.82  AORTA Ao Root diam: 3.50 cm MITRAL VALVE MV Area (PHT): 3.19 cm    SHUNTS MV Area VTI:   3.15 cm    Systemic VTI:  0.25 m MV Peak grad:  2.7 mmHg    Systemic Diam: 2.00 cm MV Mean grad:  1.0 mmHg MV Vmax:       0.82 m/s MV Vmean:      43.9 cm/s MV Decel Time: 238 msec MV E velocity: 65.50 cm/s MV A velocity: 79.50 cm/s MV E/A ratio:  0.82 Rozann Lesches MD Electronically signed by Rozann Lesches MD Signature Date/Time: 11/25/2022/11:05:16 AM    Final    CT ABDOMEN PELVIS W CONTRAST  Result Date: 11/22/2022 CLINICAL DATA:  Abdominal pain. EXAM: CT ABDOMEN AND  PELVIS WITH CONTRAST TECHNIQUE: Multidetector CT imaging of the abdomen and pelvis was performed using the standard protocol following bolus administration of intravenous contrast. RADIATION DOSE REDUCTION: This exam was performed according to the departmental dose-optimization program which includes automated exposure control, adjustment of the mA and/or kV according to patient size and/or use of iterative reconstruction technique. CONTRAST:  169mL OMNIPAQUE IOHEXOL 300 MG/ML  SOLN COMPARISON:  None Available. FINDINGS: Lower  chest: 4 mm right lung nodule identified on image 5/4. Dependent atelectasis noted in the right lower lobe. Hepatobiliary: The liver shows diffusely decreased attenuation suggesting fat deposition. There is no evidence for gallstones, gallbladder wall thickening, or pericholecystic fluid. No intrahepatic or extrahepatic biliary dilation. Pancreas: No focal mass lesion. No dilatation of the main duct. No intraparenchymal cyst. No peripancreatic edema. Spleen: No splenomegaly. No focal mass lesion. Adrenals/Urinary Tract: No adrenal nodule or mass. Kidneys unremarkable. No evidence for hydroureter. The urinary bladder appears normal for the degree of distention. Stomach/Bowel: Stomach is distended with gas and fluid. Duodenum is normally positioned as is the ligament of Treitz. Small bowel loops in the anterior abdomen are distended up to 4.3 cm diameter. The most dilated loop extends anteriorly into an umbilical hernia with fecalization of enteric contents proximal to the herniated segment. Small bowel leading the umbilical hernia and re-entering the peritoneal cavity is completely decompressed. The terminal ileum is normal. The appendix is normal. No gross colonic mass. No colonic wall thickening. Vascular/Lymphatic: No abdominal aortic aneurysm. No abdominal aortic atherosclerotic calcification. There is no gastrohepatic or hepatoduodenal ligament lymphadenopathy. No retroperitoneal or  mesenteric lymphadenopathy. No pelvic sidewall lymphadenopathy. Reproductive: The prostate gland and seminal vesicles are unremarkable. Other: No intraperitoneal free fluid. Musculoskeletal: Evidence of prior bilateral groin herniorrhaphy evident with recurrent right groin hernia containing only fat. No worrisome lytic or sclerotic osseous abnormality. IMPRESSION: 1. Small bowel obstruction secondary to an incarcerated umbilical hernia. The most dilated loop proximal to the hernia demonstrates fecalization of enteric contents compatible with decreased transit. No small bowel wall thickening or pneumatosis. No substantial perienteric edema or intraperitoneal free fluid. 2. Hepatic steatosis. 3. 4 mm right lung nodule. No follow-up needed if patient is low-risk.This recommendation follows the consensus statement: Guidelines for Management of Incidental Pulmonary Nodules Detected on CT Images: From the Fleischner Society 2017; Radiology 2017; 284:228-243. Electronically Signed   By: Misty Stanley M.D.   On: 11/22/2022 07:17      Subjective: Patient seen and examined.  Sitting comfortably on recliner.  Reports that he is doing fine, no new complaints.  He weaned off of TPN yesterday.  Tolerating soft diet without any issues.  He wishes to go home today.  Discharge Exam: Vitals:   12/07/22 0623 12/07/22 0749  BP: 114/71 108/73  Pulse: 72 81  Resp:  17  Temp: 98.4 F (36.9 C) 97.8 F (36.6 C)  SpO2: 97% 100%   Vitals:   12/06/22 0845 12/06/22 1556 12/07/22 0623 12/07/22 0749  BP: 101/84 115/70 114/71 108/73  Pulse: 78 78 72 81  Resp: 16 16  17   Temp: 98.7 F (37.1 C) 98 F (36.7 C) 98.4 F (36.9 C) 97.8 F (36.6 C)  TempSrc: Oral Oral Oral Oral  SpO2: 99% 99% 97% 100%  Weight:      Height:        General: Pt is alert, awake, not in acute distress, on room air, communicating well Cardiovascular: RRR, S1/S2 +, no rubs, no gallops Respiratory: CTA bilaterally, no wheezing, no  rhonchi Abdominal: Soft, NT, umbilical incision with staples noted.   Extremities: no edema, no cyanosis    The results of significant diagnostics from this hospitalization (including imaging, microbiology, ancillary and laboratory) are listed below for reference.     Microbiology: Recent Results (from the past 240 hour(s))  Culture, blood (Routine X 2) w Reflex to ID Panel     Status: None   Collection Time: 11/29/22  8:29 AM  Specimen: BLOOD LEFT HAND  Result Value Ref Range Status   Specimen Description   Final    BLOOD LEFT HAND BOTTLES DRAWN AEROBIC AND ANAEROBIC   Special Requests   Final    Blood Culture results may not be optimal due to an excessive volume of blood received in culture bottles   Culture   Final    NO GROWTH 5 DAYS Performed at [facility], [address], [place], [place] [address]    Report Status 12/04/2022 FINAL  Final  Culture, blood (Routine X 2) w Reflex to ID Panel     Status: None   Collection Time: 11/29/22  8:29 AM   Specimen: BLOOD LEFT WRIST  Result Value Ref Range Status   Specimen Description BLOOD LEFT WRIST AEROBIC BOTTLE ONLY  Final   Special Requests Blood Culture adequate volume  Final   Culture   Final    NO GROWTH 5 DAYS Performed at [facility], [address] [place], [place] [address]    Report Status 12/04/2022 FINAL  Final     Labs: BNP (last 3 results) No results for input(s): "BNP" in the last 8760 hours. Basic Metabolic Panel: Recent Labs  Lab 12/01/22 0357 12/02/22 0330 12/03/22 0623 12/04/22 0002 12/05/22 0915  NA 139 139 140 139 137  K 4.5 4.6 4.6 4.3 3.9  CL 108 108 110 109 107  CO2 22 23 23 24 22   GLUCOSE 133* 119* 129* 129* 114*  BUN 35* 40* 34* 28* 26*  CREATININE 1.26* 1.41* 1.32* 1.08 0.99  CALCIUM 8.7* 9.1 9.1 9.2 9.0  MG 2.6* 2.4 2.3 2.2 2.1  PHOS 3.3  --   --  2.5 2.4*   Liver Function Tests: Recent Labs  Lab 12/01/22 0357 12/02/22 0330 12/03/22 0623 12/04/22 0002  AST 60* 66* 65* 50*   ALT 92* 101* 103* 89*  ALKPHOS 100 121 114 112  BILITOT 0.6 0.5 0.5 0.6  PROT 7.0 7.1 6.8 6.8  ALBUMIN 3.0* 3.0* 2.7* 2.7*   No results for input(s): "LIPASE", "AMYLASE" in the last 168 hours. No results for input(s): "AMMONIA" in the last 168 hours. CBC: Recent Labs  Lab 12/01/22 0357 12/02/22 0330 12/03/22 0500 12/05/22 0915 12/07/22 0327  WBC 17.7* 16.7* 18.7* 18.4* 13.5*  HGB 10.8* 10.5* 9.3* 10.5* 9.7*  HCT 33.8* 33.2* 31.4* 32.8* 29.5*  MCV 90.6 92.2 96.0 90.1 87.8  PLT 356 339 322 376 336   Cardiac Enzymes: No results for input(s): "CKTOTAL", "CKMB", "CKMBINDEX", "TROPONINI" in the last 168 hours. BNP: Invalid input(s): "POCBNP" CBG: Recent Labs  Lab 12/06/22 0047 12/06/22 0554 12/06/22 1148 12/06/22 1846 12/06/22 2356  GLUCAP 90 107* 100* 81 87   D-Dimer No results for input(s): "DDIMER" in the last 72 hours. Hgb A1c No results for input(s): "HGBA1C" in the last 72 hours. Lipid Profile No results for input(s): "CHOL", "HDL", "LDLCALC", "TRIG", "CHOLHDL", "LDLDIRECT" in the last 72 hours. Thyroid function studies No results for input(s): "TSH", "T4TOTAL", "T3FREE", "THYROIDAB" in the last 72 hours.  Invalid input(s): "FREET3" Anemia work up No results for input(s): "VITAMINB12", "FOLATE", "FERRITIN", "TIBC", "IRON", "RETICCTPCT" in the last 72 hours. Urinalysis    Component Value Date/Time   COLORURINE YELLOW 11/22/2022 0810   APPEARANCEUR CLEAR 11/22/2022 0810   APPEARANCEUR Clear 08/29/2022 1119   LABSPEC >1.046 (H) 11/22/2022 0810   PHURINE 6.0 11/22/2022 0810   GLUCOSEU NEGATIVE 11/22/2022 0810   HGBUR NEGATIVE 11/22/2022 0810   BILIRUBINUR NEGATIVE 11/22/2022 0810   BILIRUBINUR  Negative 08/29/2022 1119   KETONESUR NEGATIVE 11/22/2022 0810   PROTEINUR 30 (A) 11/22/2022 0810   NITRITE NEGATIVE 11/22/2022 0810   LEUKOCYTESUR NEGATIVE 11/22/2022 0810   Sepsis Labs Recent Labs  Lab 12/02/22 0330 12/03/22 0500 12/05/22 0915 12/07/22 0327   WBC 16.7* 18.7* 18.4* 13.5*   Microbiology Recent Results (from the past 240 hour(s))  Culture, blood (Routine X 2) w Reflex to ID Panel     Status: None   Collection Time: 11/29/22  8:29 AM   Specimen: BLOOD LEFT HAND  Result Value Ref Range Status   Specimen Description   Final    BLOOD LEFT HAND BOTTLES DRAWN AEROBIC AND ANAEROBIC   Special Requests   Final    Blood Culture results may not be optimal due to an excessive volume of blood received in culture bottles   Culture   Final    NO GROWTH 5 DAYS Performed at [facility], [address] [place], [place] [address]    Report Status 12/04/2022 FINAL  Final  Culture, blood (Routine X 2) w Reflex to ID Panel     Status: None   Collection Time: 11/29/22  8:29 AM   Specimen: BLOOD LEFT WRIST  Result Value Ref Range Status   Specimen Description BLOOD LEFT WRIST AEROBIC BOTTLE ONLY  Final   Special Requests Blood Culture adequate volume  Final   Culture   Final    NO GROWTH 5 DAYS Performed at [facility], [address], [place], [place] [address]    Report Status 12/04/2022 FINAL  Final     Time coordinating discharge: Over 30 minutes  SIGNED:   Mckinley Jewel, MD  [facility] 12/07/2022, 9:49 AM Pager   If 7PM-7AM, please contact night-coverage [email or web address]

## 2022-12-07 NOTE — Progress Notes (Signed)
[facility] Progress Note  7 Days Post-Op  Subjective: Tolerating soft diet with no issues.  No N/V.  Not taking anything for pain except occasional tylenol.  Objective: Vital signs in last 24 hours: Temp:  [97.8 F (36.6 C)-98.7 F (37.1 C)] 97.8 F (36.6 C) (12/24 0749) Pulse Rate:  [72-81] 81 (12/24 0749) Resp:  [16-17] 17 (12/24 0749) BP: (101-115)/(70-84) 108/73 (12/24 0749) SpO2:  [97 %-100 %] 100 % (12/24 0749) FiO2 (%):  [21 %] 21 % (12/23 2350) Last BM Date : 12/05/22  Intake/Output from previous day: 12/23 0701 - 12/24 0700 In: 10 [I.V.:10] Out: -  Intake/Output this shift: Total I/O In: 240 [P.O.:240] Out: -   PE: Gen:  Alert, NAD, pleasant Pulm: rate and effort normal Abd: protuberant, soft, nontender, umbilical incision with staples present and some ecchymosis but no erythema or drainage, blisters noted on the left and right lateral abdomen (some open and some remain intact)   Lab Results:  Recent Labs    12/05/22 0915 12/07/22 0327  WBC 18.4* 13.5*  HGB 10.5* 9.7*  HCT 32.8* 29.5*  PLT 376 336   BMET Recent Labs    12/05/22 0915  NA 137  K 3.9  CL 107  CO2 22  GLUCOSE 114*  BUN 26*  CREATININE 0.99  CALCIUM 9.0   PT/INR No results for input(s): "LABPROT", "INR" in the last 72 hours. CMP     Component Value Date/Time   NA 137 12/05/2022 0915   NA 140 02/24/2022 0937   K 3.9 12/05/2022 0915   CL 107 12/05/2022 0915   CO2 22 12/05/2022 0915   GLUCOSE 114 (H) 12/05/2022 0915   BUN 26 (H) 12/05/2022 0915   BUN 21 02/24/2022 0937   CREATININE 0.99 12/05/2022 0915   CALCIUM 9.0 12/05/2022 0915   PROT 6.8 12/04/2022 0002   PROT 7.3 02/24/2022 0937   ALBUMIN 2.7 (L) 12/04/2022 0002   ALBUMIN 4.7 02/24/2022 0937   AST 50 (H) 12/04/2022 0002   ALT 89 (H) 12/04/2022 0002   ALKPHOS 112 12/04/2022 0002   BILITOT 0.6 12/04/2022 0002   BILITOT 0.3 02/24/2022 0937   GFRNONAA >60 12/05/2022 0915   GFRAA 81 11/16/2019 1040    Lipase     Component Value Date/Time   LIPASE 45 11/22/2022 0607       Studies/Results: No results found.  Anti-infectives: Anti-infectives (From admission, onward)    Start     Dose/Rate Route Frequency Ordered Stop   11/27/22 1600  piperacillin-tazobactam (ZOSYN) IVPB 3.375 g        3.375 g 12.5 mL/hr over 240 Minutes Intravenous Every 8 hours 11/27/22 1525 12/04/22 0941   11/22/22 1400  ceFAZolin (ANCEF) IVPB 3g/100 mL premix        3 g 200 mL/hr over 30 Minutes Intravenous  Once 11/22/22 1202 11/22/22 1245   11/22/22 1208  ceFAZolin (ANCEF) 3-0.9 GM/100ML-% IVPB       Note to Pharmacy: Lear Ng S: cabinet override      11/22/22 1208 11/22/22 1310   11/22/22 1200  ceFAZolin (ANCEF) IVPB 2g/100 mL premix  Status:  Discontinued        2 g 200 mL/hr over 30 Minutes Intravenous Every 8 hours 11/22/22 1149 11/22/22 1202        Assessment/Plan Incarcerated umbilical hernia  Postoperative ileus -[id number] s/p Open umbilical hernia repair with mesh 12/9 Dr. Okey Dupre -POD#7 s/p Evacuation of postoperative abdominal wall hematoma  12/17 Dr. Okey Dupre - Having  bowel function and tolerating soft diet with no issues. - Continue daily Miralax as needed.  Stop reglan at discharge.   - DC TNA 12/23 - Apply bacitracin to the staple line and open blisters with nonstick dressing. Abdominal binder. -patient is surgically stable for DC home from our standpoint.  We have discussed follow up with his primary surgeon.  I have sent a message to the primary service relaying this as well. -no further surgical needs.  We will sign off at this time.   ID - currently zosyn 12/14>> (for pneumonia) VTE - lovenox FEN - IVF, soft diet, decrease reglan, miralax Foley - none   VDRF - extubated 12/20  Paroxysmal atrial fibrillation/RVR-now in sinus rhythm  AKI  HTN HLD GERD Hx asthma OSA/OHS with known left hemidiaphragm paralysis  Obesity, BMI 38   I reviewed last 24 h vitals and  pain scores, last 48 h intake and output, last 24 h labs and trends, and last 24 h imaging results    LOS: 15 days    Letha Cape, [facility] 12/07/2022, 8:32 AM Please see Amion for pager number during day hours 7:00am-4:30pm

## 2022-12-09 ENCOUNTER — Telehealth: Payer: Self-pay | Admitting: *Deleted

## 2022-12-09 NOTE — Telephone Encounter (Signed)
Transition Care Management Unsuccessful Follow-up Telephone Call  Date of discharge and from where:  12/07/2022 [facility]  Attempts:  1st Attempt  Reason for unsuccessful TCM follow-up call:  Left voice message

## 2022-12-10 ENCOUNTER — Encounter: Payer: Self-pay | Admitting: Surgery

## 2022-12-10 ENCOUNTER — Ambulatory Visit (INDEPENDENT_AMBULATORY_CARE_PROVIDER_SITE_OTHER): Payer: No Typology Code available for payment source | Admitting: Surgery

## 2022-12-10 VITALS — BP 113/71 | HR 93 | Temp 98.3°F | Resp 14 | Ht 71.0 in | Wt 271.0 lb

## 2022-12-10 DIAGNOSIS — K91870 Postprocedural hematoma of a digestive system organ or structure following a digestive system procedure: Secondary | ICD-10-CM

## 2022-12-10 DIAGNOSIS — Z09 Encounter for follow-up examination after completed treatment for conditions other than malignant neoplasm: Secondary | ICD-10-CM

## 2022-12-10 MED ORDER — TRIPLE ANTIBIOTIC 5-400-5000 EX OINT: TOPICAL_OINTMENT | Freq: Every day | CUTANEOUS | 0 refills | Status: DC

## 2022-12-10 NOTE — Patient Instructions (Signed)
Wound Care Instructions: -Apply Antibiotic ointment to your incision site and belly button daily -Cover with non-stick gauze, regular gauze, and papertape -May ice abdomen as needed for swelling

## 2022-12-10 NOTE — Progress Notes (Addendum)
[facility] Note   HPI:  56 y.o. Male presents to clinic for post-op follow-up status post open umbilical hernia repair with mesh on 12/9 with subsequent hematoma evacuation in the subcutaneous tissues on 12/17.  Patient had a prolonged hospitalization with postoperative ileus and intubation.  He has been doing well since being discharged from the hospital.  He is tolerating a diet without nausea and vomiting, and he is moving his bowels daily.  He denies any significant pain at his incision site.  He does complain of some itching at his lateral abdominal wall at the site of some healing blisters.  He denies any fevers or chills.  He denies any drainage from his incision site.  He has been applying antibiotic ointment to the incision site daily.  Review of Systems:  All other review of systems: otherwise negative   Vital Signs:  BP 113/71   Pulse 93   Temp 98.3 F (36.8 C) (Oral)   Resp 14   Ht 5\' 11"  (1.803 m)   Wt 271 lb (122.9 kg)   SpO2 94%   BMI 37.80 kg/m    Physical Exam:  Physical Exam Vitals reviewed.  Constitutional:      Appearance: Normal appearance.  Abdominal:     Comments: Abdomen soft, obese, nondistended, no percussion tenderness, nontender to palpation; no rigidity, guarding, rebound tenderness; infraumbilical incision with skin staples in place, scabbing at the center of the incision site, surrounding ecchymosis and induration, though improving, scabbed blisters in the lateral aspects of both the right and left abdomen  Neurological:     Mental Status: He is alert.     Laboratory studies: None  Imaging:  None  Pathology:  A. UMBILICAL HERNIA SAC:  Fibrosis, hemorrhage and granulation tissue.  Negative for malignancy.   Assessment:  56 y.o. yo Male who presents for follow-up status post open umbilical hernia repair with mesh on 12/9 and subsequent hematoma evacuation on 12/17.  Plan:  -Overall the patient is doing very well since  discharge from the hospital.  He is tolerating a diet, moving his bowels, and pain is adequately controlled -I advised him to apply antibiotic ointment to his incision and umbilicus after showering daily.  He may replace the dressing if it becomes saturated -Prescription provided for antibiotic ointment -Some of his skin staples were removed -Advised that he can ice his abdomen in the evenings for swelling -Patient currently taking stool softeners daily.  Advised him to continue taking stool softeners daily, and take MiraLAX as needed to have daily bowel movements -Advised him to call 1/18 if he begins to have fever, chills, worsening erythema at his incision site, or purulent drainage -Follow-up with me next week for evaluation to remove remaining skin staples  All of the above recommendations were discussed with the patient and patient's family, and all of patient's and family's questions were answered to their expressed satisfaction.  [place], DO [facility] [address] [place], [place] [phone] (office)

## 2022-12-11 ENCOUNTER — Encounter (HOSPITAL_COMMUNITY): Payer: Self-pay | Admitting: Surgery

## 2022-12-13 ENCOUNTER — Encounter: Payer: Self-pay | Admitting: Family Medicine

## 2022-12-13 ENCOUNTER — Other Ambulatory Visit: Payer: Self-pay | Admitting: Family Medicine

## 2022-12-13 DIAGNOSIS — F418 Other specified anxiety disorders: Secondary | ICD-10-CM

## 2022-12-13 DIAGNOSIS — H65193 Other acute nonsuppurative otitis media, bilateral: Secondary | ICD-10-CM

## 2022-12-14 ENCOUNTER — Emergency Department (HOSPITAL_COMMUNITY): Payer: PRIVATE HEALTH INSURANCE

## 2022-12-14 ENCOUNTER — Emergency Department (HOSPITAL_COMMUNITY)
Admission: EM | Admit: 2022-12-14 | Discharge: 2022-12-14 | Disposition: A | Discharge status: Home / Self Care | Payer: PRIVATE HEALTH INSURANCE | Attending: Emergency Medicine | Admitting: Emergency Medicine

## 2022-12-14 ENCOUNTER — Encounter (HOSPITAL_COMMUNITY): Payer: Self-pay

## 2022-12-14 ENCOUNTER — Other Ambulatory Visit: Payer: Self-pay

## 2022-12-14 DIAGNOSIS — J4520 Mild intermittent asthma, uncomplicated: Secondary | ICD-10-CM | POA: Insufficient documentation

## 2022-12-14 DIAGNOSIS — R2231 Localized swelling, mass and lump, right upper limb: Secondary | ICD-10-CM | POA: Diagnosis present

## 2022-12-14 DIAGNOSIS — E876 Hypokalemia: Secondary | ICD-10-CM | POA: Insufficient documentation

## 2022-12-14 DIAGNOSIS — I10 Essential (primary) hypertension: Secondary | ICD-10-CM | POA: Insufficient documentation

## 2022-12-14 DIAGNOSIS — R600 Localized edema: Secondary | ICD-10-CM | POA: Insufficient documentation

## 2022-12-14 DIAGNOSIS — M25511 Pain in right shoulder: Secondary | ICD-10-CM | POA: Insufficient documentation

## 2022-12-14 DIAGNOSIS — G4733 Obstructive sleep apnea (adult) (pediatric): Secondary | ICD-10-CM | POA: Insufficient documentation

## 2022-12-14 LAB — BASIC METABOLIC PANEL
Anion gap: 6 (ref 5–15)
BUN: 12 mg/dL (ref 6–20)
CO2: 29 mmol/L (ref 22–32)
Calcium: 9 mg/dL (ref 8.9–10.3)
Chloride: 106 mmol/L (ref 98–111)
Creatinine, Ser: 0.96 mg/dL (ref 0.61–1.24)
GFR, Estimated: >60 mL/min (ref >60)
Glucose, Bld: 103 mg/dL — ABNORMAL HIGH (ref 70–99)
Potassium: 3.2 mmol/L — ABNORMAL LOW (ref 3.5–5.1)
Sodium: 141 mmol/L (ref 135–145)

## 2022-12-14 LAB — CBC
HCT: 30.6 % — ABNORMAL LOW (ref 39.0–52.0)
Hemoglobin: 9.7 g/dL — ABNORMAL LOW (ref 13.0–17.0)
MCH: 29 pg (ref 26.0–34.0)
MCHC: 31.7 g/dL (ref 30.0–36.0)
MCV: 91.6 fL (ref 80.0–100.0)
Platelets: 402 10*3/uL — ABNORMAL HIGH (ref 150–400)
RBC: 3.34 MIL/uL — ABNORMAL LOW (ref 4.22–5.81)
RDW: 14.1 % (ref 11.5–15.5)
WBC: 8.5 10*3/uL (ref 4.0–10.5)
nRBC: 0 % (ref 0.0–0.2)

## 2022-12-14 LAB — BRAIN NATRIURETIC PEPTIDE: B Natriuretic Peptide: 68 pg/mL (ref 0.0–100.0)

## 2022-12-14 LAB — PROTIME-INR
INR: 1.1 (ref 0.8–1.2)
Prothrombin Time: 14.4 seconds (ref 11.4–15.2)

## 2022-12-14 LAB — D-DIMER, QUANTITATIVE: D-Dimer, Quant: 2.95 ug/mL-FEU — ABNORMAL HIGH (ref 0.00–0.50)

## 2022-12-14 MED ORDER — ENOXAPARIN SODIUM 150 MG/ML IJ SOSY
130.0000 mg | PREFILLED_SYRINGE | Freq: Once | INTRAMUSCULAR | Status: AC
  Administered 2022-12-14: 130 mg via SUBCUTANEOUS
  Filled 2022-12-14: qty 1

## 2022-12-14 MED ORDER — POTASSIUM CHLORIDE CRYS ER 20 MEQ PO TBCR
40.0000 meq | EXTENDED_RELEASE_TABLET | Freq: Once | ORAL | Status: AC
  Administered 2022-12-14: 40 meq via ORAL
  Filled 2022-12-14: qty 2

## 2022-12-14 NOTE — ED Provider Notes (Signed)
[facility] EMERGENCY DEPARTMENT Provider Note   CSN: [id number] Arrival date & time: 12/14/22  1522     History {Add pertinent medical, surgical, social history, OB history to HPI:1} Chief Complaint  Patient presents with   Leg Swelling    Kyle Burns is a 56 y.o. 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 The patient underwent umbilical hernia repair 12/9 with mesh placement.  Postop day 3 he developed abdominal distention as well as atrial fibrillation with RVR.  He required IV Cardizem, amiodarone, digoxin, and placement of an NG tube.  His condition continued to decline and ultimately he required intubation 11/16/2022 for acute respiratory distress.  Notes RUE swelling, BL ankle swelling. Denies CP or SOB. Endorses R shoulder pain where the PICC line was located.   HPI     Home Medications Prior to Admission medications   Medication Sig Start Date End Date Taking? Authorizing Provider  acetaminophen (TYLENOL) 325 MG tablet Take 2 tablets (650 mg total) by mouth every 6 (six) hours as needed for mild pain, fever or headache. 12/07/22   [name], PA-C  albuterol (VENTOLIN HFA) 108 (90 Base) MCG/ACT inhaler USE 2 PUFFS EVERY 6 HOURS AS NEEDED FOR WHEEZING (1 for home, 1 for work) Patient taking differently: Inhale 2 puffs into the lungs every 6 (six) hours as needed for wheezing. 09/17/22   Daryll Drown, NP  aspirin 81 MG EC tablet Take 162 mg by mouth in the morning.    [provider]  atorvastatin (LIPITOR) 10 MG tablet Take 1 tablet (10 mg total) by mouth  daily. Patient taking differently: Take 10 mg by mouth at bedtime. 11/04/21   Raliegh Ip, DO  budesonide-formoterol (SYMBICORT) 80-4.5 MCG/ACT inhaler Inhale 2 puffs into the lungs 2 (two) times daily. Patient taking differently: Inhale 2 puffs into the lungs daily as needed (wheezing). 11/04/21   Raliegh Ip, DO  esomeprazole (NEXIUM) 40 MG capsule Take 1 capsule (40 mg total) by mouth daily. Patient taking differently: Take 40 mg by mouth in the morning. 11/04/21   Raliegh Ip, DO  famotidine (PEPCID) 20 MG tablet One after supper Patient taking differently: Take 20 mg by mouth every evening. After supper 09/24/22   [name], MD  FLUoxetine (PROZAC) 20 MG capsule TAKE ONE (1) CAPSULE EACH DAY Patient taking differently: Take 20 mg by mouth at bedtime. 10/06/22   Raliegh Ip, DO  fluticasone (FLONASE) 50 MCG/ACT nasal spray Place 2 sprays into both nostrils daily. Patient taking differently: Place 2 sprays into both nostrils in the morning. 11/04/21   Raliegh Ip, DO  furosemide (LASIX) 20 MG tablet TAKE ONE (1) TABLET EACH DAY Patient taking differently: Take 20 mg by mouth in the morning. 11/04/21   Raliegh Ip, DO  levocetirizine (XYZAL) 5 MG tablet Take 1 tablet (5 mg total) by mouth every evening. To replace Zyrtec Patient taking differently: Take 5 mg by mouth at bedtime. 10/06/22   Raliegh Ip, DO  montelukast (SINGULAIR) 10 MG tablet Take 1 tablet (10 mg total) by mouth at bedtime. 09/12/22   [name],  FNP  Multiple Vitamins-Minerals (ONE-A-DAY MENS 50+) TABS Take 1 tablet by mouth in the morning.    [provider]  neomycin-bacitracin-polymyxin (NEOSPORIN) [phone] ointment Apply topically daily. Apply to your incision site and belly button daily after bathing 12/10/22   Pappayliou, [name] A, DO  polyethylene glycol (MIRALAX / GLYCOLAX) 17 g packet Take 17 g by mouth daily as needed. 12/07/22   Saverio Danker, PA-C      Allergies    Patient has no known allergies.    Review of Systems   Review of Systems Review of systems {pos/neg:18640::"Negative","Positive"} for ***.  A 10 point review of systems was performed and is negative unless otherwise reported in HPI.  Physical Exam Updated Vital Signs BP 120/76 (BP Location: Left Arm)   Pulse 90   Temp 99.2 F (37.3 C) (Oral)   Resp 18   Ht 5\' 11"  (1.803 m)   Wt 127.5 kg   SpO2 97%   BMI 39.19 kg/m  Physical Exam General: Normal appearing {Desc; male/male:11659}, lying in bed.  HEENT: PERRLA, Sclera anicteric, MMM, trachea midline.  Cardiology: RRR, no murmurs/rubs/gallops. BL radial and DP pulses equal bilaterally.  Resp: Normal respiratory rate and effort. CTAB, no wheezes, rhonchi, crackles.  Abd: Soft, non-tender, non-distended. No rebound tenderness or guarding.  GU: Deferred. MSK: No peripheral edema or signs of trauma. Extremities without deformity or TTP. No cyanosis or clubbing. Skin: warm, dry. No rashes or lesions. Back: No CVA tenderness Neuro: A&Ox4, CNs II-XII grossly intact. MAEs. Sensation grossly intact.  Psych: Normal mood and affect.   ED Results / Procedures / Treatments   Labs (all labs ordered are listed, but only abnormal results are displayed) Labs Reviewed  BASIC METABOLIC PANEL - Abnormal; Notable for the following components:      Result Value   Potassium 3.2 (*)    Glucose, Bld 103 (*)    All other components within normal limits  CBC - Abnormal; Notable for the following components:   RBC 3.34 (*)    Hemoglobin 9.7 (*)    HCT 30.6 (*)    Platelets 402 (*)    All other components within normal limits  D-DIMER, QUANTITATIVE - Abnormal; Notable for the following components:   D-Dimer, Quant 2.95 (*)    All other components within normal limits  PROTIME-INR  BRAIN NATRIURETIC PEPTIDE    EKG None  Radiology No results found.  Procedures Procedures  {Document cardiac monitor,  telemetry assessment procedure when appropriate:1}  Medications Ordered in ED Medications - No data to display  ED Course/ Medical Decision Making/ A&P                          Medical Decision Making Amount and/or Complexity of Data Reviewed Labs: ordered. Decision-making details documented in ED Course.  Risk Prescription drug management.    This patient presents to the ED for concern of ***, this involves an extensive number of treatment options, and is a complaint that carries with it a high risk of complications and morbidity.  I considered the following differential and admission for this acute, potentially life threatening condition.   MDM:    ***  Clinical Course as of 12/14/22 1844  Sun Dec 14, 2022  1738 D-Dimer, Quant(!): 2.95 Ordering RUE DVT US [HN]  1738 WBC: 8.5 [HN]  1738 Hemoglobin(!): 9.7 Same as DC value on 12/24 9.7 [HN]  1738 INR: 1.1 [HN]  1738 Potassium(!): 3.2 Repleting [HN]  1843 B Natriuretic Peptide: 68.0 [HN]    Clinical Course User Index [HN] Audley Hose, MD    Labs: I Ordered, and personally interpreted labs.  The pertinent results include:  ***  Imaging Studies ordered: I ordered imaging studies including *** I independently visualized and interpreted imaging. I agree with the radiologist interpretation  Additional history obtained from ***.  External records from outside source obtained and reviewed including ***  Cardiac Monitoring: The patient was maintained on a cardiac monitor.  I personally viewed and interpreted the cardiac monitored which showed an underlying rhythm of: ***  Reevaluation: After the interventions noted above, I reevaluated the patient and found that they have :{resolved/improved/worsened:23923::"improved"}  Social Determinants of Health: ***  Disposition:  ***  Co morbidities that complicate the patient evaluation  Past Medical History:  Diagnosis Date   Back pain    GERD (gastroesophageal  reflux disease)    History of asthma    HTN (hypertension)    Hyperlipidemia    OSA (obstructive sleep apnea)    BiPAP     Medicines No orders of the defined types were placed in this encounter.   I have reviewed the patients home medicines and have made adjustments as needed  Problem List / ED Course: Problem List Items Addressed This Visit   None        {Document critical care time when appropriate:1} {Document review of labs and clinical decision tools ie heart score, Chads2Vasc2 etc:1}  {Document your independent review of radiology images, and any outside records:1} {Document your discussion with family members, caretakers, and with consultants:1} {Document social determinants of health affecting pt's care:1} {Document your decision making why or why not admission, treatments were needed:1}  This note was created using dictation software, which may contain spelling or grammatical errors.

## 2022-12-14 NOTE — Discharge Instructions (Addendum)
Thank you for coming to [facility] Emergency Department. You were seen for swelling. We did an exam, labs, and imaging, and these showed an elevated D-dimer, which can indicate a blood clot called a deep venous thrombosis.  Given that you recently had a PICC line in your right arm, this is concerning for a clot in your right arm.  Unfortunately we do not have ultrasound at this hospital at this time.  We have placed a order for you to come back tomorrow morning to have ultrasounds of your right arm, and both legs. We have treated you with Lovenox, a blood thinner, which will treat you in the meantime if you do have a clot.  Please return to [facility] Emergency Department in the morning. Please follow up with your primary care provider within 1-2 weeks for reevaluation and recheck of your hemoglobin and potassium, as these values were low today.  Do not hesitate to return to the ED or call 911 if you experience: -Worsening symptoms -Chest pain, shortness of breath -Lightheadedness, passing out -Fevers/chills -Anything else that concerns you

## 2022-12-14 NOTE — ED Triage Notes (Signed)
PT with recent admission to this hospital with a transfer to [facility] presents with edema to bilateral legs and right arm where PICC line was recently removed.  Pt reports he was told to stop his BP and fluid pills upon discharge but believes he needs it back.

## 2022-12-15 ENCOUNTER — Ambulatory Visit (HOSPITAL_COMMUNITY)
Admission: RE | Admit: 2022-12-15 | Discharge: 2022-12-15 | Disposition: A | Discharge status: Home / Self Care | Payer: PRIVATE HEALTH INSURANCE | Source: Ambulatory Visit | Attending: Emergency Medicine | Admitting: Emergency Medicine

## 2022-12-15 ENCOUNTER — Other Ambulatory Visit (HOSPITAL_COMMUNITY): Payer: Self-pay | Admitting: Emergency Medicine

## 2022-12-15 DIAGNOSIS — R600 Localized edema: Secondary | ICD-10-CM

## 2022-12-15 DIAGNOSIS — R2231 Localized swelling, mass and lump, right upper limb: Secondary | ICD-10-CM

## 2022-12-15 DIAGNOSIS — E876 Hypokalemia: Secondary | ICD-10-CM

## 2022-12-15 MED ORDER — APIXABAN (ELIQUIS) VTE STARTER PACK (10MG AND 5MG): ORAL_TABLET | ORAL | 0 refills | Status: DC

## 2022-12-15 NOTE — Telephone Encounter (Signed)
Right upper extremity ultrasound DVT study was positive right axillary and subclavian DVTs.  Started patient on Eliquis starter pack and advised him to follow-up with his PCP.  Also advised patient to stop taking his aspirin.

## 2022-12-16 NOTE — Telephone Encounter (Signed)
Please make sure he has a f/u visit with me in the next 2-3 weeks, so we can transition him to maintenance eliquis

## 2022-12-17 NOTE — Telephone Encounter (Signed)
Okay we will see you then.

## 2022-12-18 ENCOUNTER — Encounter: Payer: Self-pay | Admitting: Surgery

## 2022-12-18 ENCOUNTER — Ambulatory Visit (INDEPENDENT_AMBULATORY_CARE_PROVIDER_SITE_OTHER): Payer: No Typology Code available for payment source | Admitting: Surgery

## 2022-12-18 VITALS — BP 132/81 | HR 71 | Temp 99.0°F | Resp 12 | Ht 71.0 in | Wt 272.0 lb

## 2022-12-18 DIAGNOSIS — Z09 Encounter for follow-up examination after completed treatment for conditions other than malignant neoplasm: Secondary | ICD-10-CM

## 2022-12-18 NOTE — Progress Notes (Signed)
[facility] Note   HPI:  57 y.o. Male presents to clinic for for postoperative follow-up status post open umbilical hernia repair with mesh on 12/9 with subsequent hematoma evacuation in the subcutaneous tissues on 12/17.  Patient states that he continues to do well.  He has no pain associated with his surgical site.  He is tolerating a diet without nausea and vomiting, moving his bowels without issue.  He denies any fevers or chills.  He denies any drainage from his incision site.  Review of Systems:  All other review of systems: otherwise negative   Vital Signs:  BP 132/81   Pulse 71   Temp 99 F (37.2 C) (Oral)   Resp 12   Ht 5\' 11"  (1.803 m)   Wt 272 lb (123.4 kg)   SpO2 95%   BMI 37.94 kg/m    Physical Exam:  Physical Exam Vitals reviewed.  Constitutional:      Appearance: Normal appearance.  Abdominal:     Comments: Abdomen soft, obese, nondistended, no percussion tenderness, nontender to palpation; no rigidity, guarding, rebound tenderness; infraumbilical incision healing well with some skin staples still in place, scabbing at the center aspect of the incision, improving induration and ecchymosis, epidermis sloughing at umbilicus with viable skin underneath, lateral abdominal blister sites healing     Laboratory studies: None  Imaging:  None  Assessment:  57 y.o. yo Male who presents for follow-up status post open umbilical hernia repair with mesh on 12/9 and set evacuation on 12/17.  Plan:  -Remaining skin staples removed -Given scabbing at middle aspect of incision, recommend continued use of the antibiotic ointment to this area and within umbilicus -Work note provided to the patient stating that he may return to light duty given control of his pain -Follow-up with me in 1 month  All of the above recommendations were discussed with the patient and patient's family, and all of patient's and family's questions were answered to their expressed  satisfaction.  Graciella Freer, DO [facility] [address] [place], [place] [address] [phone] (office)

## 2022-12-21 ENCOUNTER — Encounter: Payer: Self-pay | Admitting: Family Medicine

## 2022-12-22 ENCOUNTER — Ambulatory Visit: Payer: No Typology Code available for payment source | Admitting: Nurse Practitioner

## 2022-12-22 ENCOUNTER — Encounter: Payer: Self-pay | Admitting: Nurse Practitioner

## 2022-12-22 VITALS — BP 132/81 | HR 83 | Temp 98.4°F | Resp 20 | Ht 71.0 in | Wt 278.0 lb

## 2022-12-22 DIAGNOSIS — M25473 Effusion, unspecified ankle: Secondary | ICD-10-CM | POA: Diagnosis not present

## 2022-12-22 DIAGNOSIS — J069 Acute upper respiratory infection, unspecified: Secondary | ICD-10-CM | POA: Diagnosis not present

## 2022-12-22 MED ORDER — FUROSEMIDE 40 MG PO TABS: 40.0000 mg | ORAL_TABLET | Freq: Every day | ORAL | 3 refills | Status: DC

## 2022-12-22 MED ORDER — BENZONATATE 100 MG PO CAPS: 100.0000 mg | ORAL_CAPSULE | Freq: Three times a day (TID) | ORAL | 0 refills | Status: DC | PRN

## 2022-12-22 NOTE — Patient Instructions (Signed)

## 2022-12-22 NOTE — Progress Notes (Signed)
Subjective:    Patient ID: Kyle Burns, male    DOB: 28-May-1966, 57 y.o.   MRN: [id number]   Chief Complaint: bilateral ankle swelling (Discharged from hospital on 12/24)   HPI Patient has 2 complaints today: - cough an congestion that stated a week ago. Some better. Still has productive cough. No fever.  - bil ankle swelling for about 2 weeks now. Some better when he elevates his legs. He was on spirolactone that was stopped. Is still on lasix.    Review of Systems  Constitutional:  Negative for chills, fatigue and fever.  HENT:  Positive for congestion and rhinorrhea. Negative for sinus pressure, sinus pain, sneezing and sore throat.   Respiratory:  Positive for cough. Negative for shortness of breath and wheezing.   Cardiovascular:  Positive for leg swelling.       Objective:   Physical Exam Vitals reviewed.  Constitutional:      Appearance: Normal appearance. He is obese.  HENT:     Right Ear: Tympanic membrane normal.     Left Ear: Tympanic membrane normal.     Nose: Congestion and rhinorrhea present.     Mouth/Throat:     Pharynx: No oropharyngeal exudate or posterior oropharyngeal erythema.  Eyes:     Pupils: Pupils are equal, round, and reactive to light.  Cardiovascular:     Rate and Rhythm: Normal rate and regular rhythm.  Pulmonary:     Effort: Pulmonary effort is normal.     Breath sounds: Normal breath sounds.  Musculoskeletal:     Right lower leg: Edema (1+ ankle) present.     Left lower leg: Edema (1+ ankle) present.  Skin:    General: Skin is warm.  Neurological:     General: No focal deficit present.     Mental Status: He is alert and oriented to person, place, and time.  Psychiatric:        Mood and Affect: Mood normal.        Behavior: Behavior normal.    BP 132/81   Pulse 83   Temp 98.4 F (36.9 C) (Temporal)   Resp 20   Ht 5\' 11"  (1.803 m)   Wt 278 lb (126.1 kg)   SpO2 97%   BMI 38.77 kg/m         Assessment & Plan:    in today with chief complaint of bilateral ankle swelling (Discharged from hospital on 12/24)   1. Ankle edema Increase lasix to 40mg  daily Compression socks daily Elevate legs when sitting - furosemide (LASIX) 40 MG tablet; Take 1 tablet (40 mg total) by mouth daily.  Dispense: 30 tablet; Refill: 3  2. URI with cough and congestion No prednisone because just had surgery 1. Take meds as prescribed 2. Use a cool mist humidifier especially during the winter months and when heat has been humid. 3. Use saline nose sprays frequently 4. Saline irrigations of the nose can be very helpful if done frequently.  * 4X daily for 1 week*  * Use of a nettie pot can be helpful with this. Follow directions with this* 5. Drink plenty of fluids 6. Keep thermostat turn down low 7.For any cough or congestion- tessalon perles 8. For fever or aces or pains- take tylenol or ibuprofen appropriate for age and weight.  * for fevers greater than 101 orally you may alternate ibuprofen and tylenol every  3 hours.    Meds ordered this encounter  Medications  furosemide (LASIX) 40 MG tablet    Sig: Take 1 tablet (40 mg total) by mouth daily.    Dispense:  30 tablet    Refill:  3    Order Specific Question:   Supervising Provider    Answer:   Caryl Pina A [[id number]]   benzonatate (TESSALON PERLES) 100 MG capsule    Sig: Take 1 capsule (100 mg total) by mouth 3 (three) times daily as needed.    Dispense:  20 capsule    Refill:  0    Order Specific Question:   Supervising Provider    Answer:   Caryl Pina A [[id number]]      The above assessment and management plan was discussed with the patient. The patient verbalized understanding of and has agreed to the management plan. Patient is aware to call the clinic if symptoms persist or worsen. Patient is aware when to return to the clinic for a follow-up visit. Patient educated on when it is appropriate to go to the emergency  department.   Mary-Margaret Hassell Done, FNP

## 2022-12-24 ENCOUNTER — Ambulatory Visit: Payer: No Typology Code available for payment source

## 2022-12-29 ENCOUNTER — Encounter: Payer: Self-pay | Admitting: Nurse Practitioner

## 2022-12-29 ENCOUNTER — Other Ambulatory Visit (HOSPITAL_COMMUNITY)
Admission: RE | Admit: 2022-12-29 | Discharge: 2022-12-29 | Disposition: A | Discharge status: Home / Self Care | Payer: No Typology Code available for payment source | Source: Ambulatory Visit | Attending: Nurse Practitioner | Admitting: Nurse Practitioner

## 2022-12-29 ENCOUNTER — Ambulatory Visit: Payer: No Typology Code available for payment source | Attending: Nurse Practitioner | Admitting: Nurse Practitioner

## 2022-12-29 VITALS — BP 136/90 | HR 76 | Ht 71.0 in | Wt 271.0 lb

## 2022-12-29 DIAGNOSIS — D509 Iron deficiency anemia, unspecified: Secondary | ICD-10-CM

## 2022-12-29 DIAGNOSIS — D649 Anemia, unspecified: Secondary | ICD-10-CM | POA: Diagnosis not present

## 2022-12-29 DIAGNOSIS — E876 Hypokalemia: Secondary | ICD-10-CM | POA: Insufficient documentation

## 2022-12-29 DIAGNOSIS — E782 Mixed hyperlipidemia: Secondary | ICD-10-CM | POA: Diagnosis not present

## 2022-12-29 DIAGNOSIS — I480 Paroxysmal atrial fibrillation: Secondary | ICD-10-CM

## 2022-12-29 DIAGNOSIS — I10 Essential (primary) hypertension: Secondary | ICD-10-CM

## 2022-12-29 DIAGNOSIS — I82621 Acute embolism and thrombosis of deep veins of right upper extremity: Secondary | ICD-10-CM | POA: Insufficient documentation

## 2022-12-29 DIAGNOSIS — I5032 Chronic diastolic (congestive) heart failure: Secondary | ICD-10-CM

## 2022-12-29 DIAGNOSIS — G4733 Obstructive sleep apnea (adult) (pediatric): Secondary | ICD-10-CM

## 2022-12-29 LAB — CBC
HCT: 35.2 % — ABNORMAL LOW (ref 39.0–52.0)
Hemoglobin: 11.2 g/dL — ABNORMAL LOW (ref 13.0–17.0)
MCH: 28.6 pg (ref 26.0–34.0)
MCHC: 31.8 g/dL (ref 30.0–36.0)
MCV: 90 fL (ref 80.0–100.0)
Platelets: 329 10*3/uL (ref 150–400)
RBC: 3.91 MIL/uL — ABNORMAL LOW (ref 4.22–5.81)
RDW: 13.9 % (ref 11.5–15.5)
WBC: 6.4 10*3/uL (ref 4.0–10.5)
nRBC: 0 % (ref 0.0–0.2)

## 2022-12-29 LAB — BASIC METABOLIC PANEL
Anion gap: 9 (ref 5–15)
BUN: 12 mg/dL (ref 6–20)
CO2: 27 mmol/L (ref 22–32)
Calcium: 9 mg/dL (ref 8.9–10.3)
Chloride: 104 mmol/L (ref 98–111)
Creatinine, Ser: 1.04 mg/dL (ref 0.61–1.24)
GFR, Estimated: >60 mL/min (ref >60)
Glucose, Bld: 98 mg/dL (ref 70–99)
Potassium: 3.1 mmol/L — ABNORMAL LOW (ref 3.5–5.1)
Sodium: 140 mmol/L (ref 135–145)

## 2022-12-29 NOTE — Patient Instructions (Signed)
Medication Instructions:  Your physician recommends that you continue on your current medications as directed. Please refer to the Current Medication list given to you today.  *If you need a refill on your cardiac medications before your next appointment, please call your pharmacy*   Lab Work: Your physician recommends that you return for lab work in: Today ( CBC, BMET)   If you have labs (blood work) drawn today and your tests are completely normal, you will receive your results only by: MyChart Message (if you have [place]) OR A paper copy in the mail If you have any lab test that is abnormal or we need to change your treatment, we will call you to review the results.   Testing/Procedures: NONE     Follow-Up: At [facility], you and your health needs are our priority.  As part of our continuing mission to provide you with exceptional heart care, we have created designated Provider Care Teams.  These Care Teams include your primary Cardiologist (physician) and Advanced Practice Providers (APPs -  Physician Assistants and Nurse Practitioners) who all work together to provide you with the care you need, when you need it.  We recommend signing up for the patient portal called "MyChart".  Sign up information is provided on this After Visit Summary.  MyChart is used to connect with patients for Virtual Visits (Telemedicine).  Patients are able to view lab/test results, encounter notes, upcoming appointments, etc.  Non-urgent messages can be sent to your provider as well.   To learn more about what you can do with MyChart, go to [email or web address].    Your next appointment:   2-3 month(s)  Provider:   You may see Rozann Lesches, MD or one of the following Advanced Practice Providers on your designated Care Team:   Bernerd Pho, PA-C  Ermalinda Barrios, PA-C     Other Instructions Thank you for choosing [facility]!

## 2022-12-29 NOTE — Progress Notes (Signed)
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 Asthma as child no problem as adolescent  self referred to pulmonary clinic in [place]  04/02/2022  with documented L HD immobility and MO by BMI c/b OSA on cpap      History of Present Illness  04/02/2022  Pulmonary/ 1st office eval/ Riya Hux[place] / [place] [facility] was maintained on symbicort previously but off x over  a month s change in symptoms  Chief Complaint  Patient presents with   New Patient (Initial Visit)    Has seen Dr. Lake Bells.  Feels asthma is going well.   Dyspnea:  not sure symbicort really helps  Cough: none  SABA use: last used a month prior to OV   Rec Symbicort 80 can be used up to 2 puffs  first thing in am and 2 puff every every 12 hours and then wean off after a week Only use your albuterol as a rescue medication Ok to try albuterol 15 min before an activity (on alternating days)  that you know would usually make you short of breath  Work on inhaler technique  05/22/22  [facility] eval for osa > rx Bipap /02  09/24/2022  f/u ov/[place] [place] [facility]/Makira Holleman re: asthma maint on symbicort 80 2bid   Chief Complaint  Patient presents with   Follow-up    Cough is still bothering patient. Dry cough most of the time but sometimes productive   Dyspnea:  ok flat surface / problems with hills  = MMRC1 = can walk nl pace, flat grade, can't hurry or go uphills or steps s sob   Cough: worse with speaking or deep breath x one month   Sleeping: on Bipap  / 02  x 4h  feels rested but not getting enough pressure  Supine/ 4 pillows  has gained 10 lbs since starting  SABA use: qod  02: none daytime/  2lpm hs  Covid status: all but new one  Rec Dr Halford Chessman next available to troubleshoot your bipap problems Continue nexium but take it 30-60 before 1st meal and add  after supper  Pepcid 20 mg  GERD diet Work on inhaler technique:   Try delsym 2 tsp twice daily as needed  for cough   Allergy screen 09/24/2022 >  Eos 0.1 /  IgE 25       11/14/2022  f/u ov/Kerrianne Jeng re: doe/cough maint   off symbicort x one week and cough maybe some better / convinced this  is pnds triggered  Chief Complaint  Patient presents with   Follow-up    Cough persistent  Dyspnea:  no change ex tol on vs off symbicort Cough: mostly dry/ sense of pnds worse before supper and at bedime  Sleeping: not disturbing sleep / bed= flat waterbed / 2 pillows  SABA use: none  02: for bipap   Covid status:   vax x last one,? If ever infected Rec Depomedrol 120 mg IM  Stay on nexium and pepcid as you are  GERD diet reviewed, bed blocks rec  For drainage / throat tickle try take CHLORPHENIRAMINE  4 mg  Stop symicort and just use albuterol as needed for now Please schedule a follow up office visit in 6 weeks, call sooner if needed with all medications /inhalers/ solutions in hand    Late add: if not improving > tessalon 200    12/30/2022  f/u ov/[place]  office/Keegan Ducey re: cough x 08/2022   maint on gerd rx   Chief Complaint  Patient presents with   Follow-up    Cough with clear sputum but sometimes green tint.  Sore throat for about a week   Dyspnea:  walking fine Cough: onset was 08/2022/ more productive recently / slt greenish secretions Sleeping: on bipap / raised up on couch since discharge from abd surgery complicated by prolonged vent  Waking up sev times a night coughing SABA use: albuterol seem to help soe 02: 3lpm at hs with bipap        No obvious day to day or daytime variability or assoc excess/ purulent sputum or mucus plugs or hemoptysis or cp or chest tightness, subjective wheeze or overt sinus or hb symptoms.   Sleeping  without nocturnal  or early am exacerbation  of respiratory  c/o's or need for noct saba. Also denies any obvious fluctuation of symptoms with weather or environmental changes or other aggravating or alleviating factors except as outlined above   No unusual  exposure hx or h/o childhood pna  or knowledge of premature birth.  Current Allergies, Complete Past Medical History, Past Surgical History, Family History, and Social History were reviewed in [name] record.  ROS  The following are not active complaints unless bolded Hoarseness, sore throat, dysphagia, dental problems, itching, sneezing,  nasal congestion or discharge of excess mucus or purulent secretions, ear ache,   fever, chills, sweats, unintended wt loss or wt gain, classically pleuritic or exertional cp,  orthopnea pnd or arm/hand swelling  or leg swelling, presyncope, palpitations, abdominal pain, anorexia, nausea, vomiting, diarrhea  or change in bowel habits or change in bladder habits, change in stools or change in urine, dysuria, hematuria,  rash, arthralgias, visual complaints, headache, numbness, weakness or ataxia or problems with walking or coordination,  change in mood or  memory.        Current Meds  Medication Sig   acetaminophen (TYLENOL) 325 MG tablet Take 2 tablets (650 mg total) by mouth every 6 (six) hours as needed for mild pain, fever or headache.   albuterol (VENTOLIN HFA) 108 (90 Base) MCG/ACT inhaler USE 2 PUFFS EVERY 6 HOURS AS NEEDED FOR WHEEZING (1 for home, 1 for work) (Patient taking differently: Inhale 2 puffs into the lungs every 6 (six) hours as needed for wheezing.)   APIXABAN (ELIQUIS) VTE STARTER PACK (10MG  AND 5MG ) Take as directed on package: start with two-5mg  tablets twice daily for 7 days. On day 8, switch to one-5mg  tablet twice daily.   atorvastatin (LIPITOR) 10 MG tablet Take 1 tablet (10 mg total) by mouth daily.   benzonatate (TESSALON PERLES) 100 MG capsule Take 1 capsule (100 mg total) by mouth 3 (three) times daily as needed.   budesonide-formoterol (SYMBICORT) 80-4.5 MCG/ACT inhaler Inhale 2 puffs into the lungs 2 (two) times daily. (Patient taking differently: Inhale 2 puffs into the lungs daily as needed (wheezing).)    docusate sodium (COLACE) 100 MG capsule Take 100 mg by mouth daily.   esomeprazole (NEXIUM) 40 MG capsule Take 1 capsule (40 mg total) by mouth daily. (Patient taking differently: Take 40 mg by mouth in the morning.)   famotidine (PEPCID) 20 MG tablet One after supper (Patient taking differently: Take 20 mg by mouth every evening. After supper)   FLUoxetine (PROZAC) 20 MG capsule TAKE ONE (1) CAPSULE EACH DAY   fluticasone (FLONASE) 50 MCG/ACT nasal spray Place 2 sprays into both nostrils daily. (  Patient taking differently: Place 2 sprays into both nostrils in the morning.)   furosemide (LASIX) 40 MG tablet Take 1 tablet (40 mg total) by mouth daily.   levocetirizine (XYZAL) 5 MG tablet TAKE 1 TABLET BY MOUTH EVERY EVENING   montelukast (SINGULAIR) 10 MG tablet Take 1 tablet (10 mg total) by mouth at bedtime.   Multiple Vitamins-Minerals (ONE-A-DAY MENS 50+) TABS Take 1 tablet by mouth in the morning.   neomycin-bacitracin-polymyxin (NEOSPORIN) [phone] ointment Apply topically daily. Apply to your incision site and belly button daily after bathing   polyethylene glycol (MIRALAX / GLYCOLAX) 17 g packet Take 17 g by mouth daily as needed.   potassium chloride SA (KLOR-CON M) 20 MEQ tablet Take 2 tablets (40 meq total) for one time   spironolactone (ALDACTONE) 25 MG tablet Take 1 tablet (25 mg total) by mouth daily.            Past Medical History:  Diagnosis Date   Asthma    hx of    Back pain    Bilevel positive airway pressure (BPAP) dependence    GERD (gastroesophageal reflux disease)    HTN (hypertension)    Hyperlipidemia         Objective:     12/30/2022      270 11/14/2022      295   09/24/22 295 lb (133.8 kg)  09/12/22 293 lb (132.9 kg)  09/01/22 300 lb (136.1 kg)    Vital signs reviewed  12/30/2022  - Note at rest 02 sats  96% on RA   General appearance:    slt hoarse amb obese bm nad    HEENT : Oropharynx  clear      NECK :  without  apparent JVD/ palpable  Nodes/TM   LUNGS: no acc muscle use,  Nl contour chest which is clear to A and P bilaterally without cough on insp or exp maneuvers   CV:  RRR  no s3 or murmur or increase in P2, and no edema   ABD:  soft and nontender with nl inspiratory excursion in the supine position. No bruits or organomegaly appreciated   MS:  Nl gait/ ext warm without deformities Or obvious joint restrictions  calf tenderness, cyanosis or clubbing    SKIN: warm and dry without lesions    NEURO:  alert, approp, nl sensorium with  no motor or cerebellar deficits apparent.          Assessment

## 2022-12-29 NOTE — Progress Notes (Signed)
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  Past Medical History    Past Medical History:  Diagnosis Date   Arm DVT (deep venous thromboembolism), acute, right ([place])    a. 12/2022 U/S R subclavian and axillary DVT (had PICC line during hosp in 11/2022).  Eliquis initiated.   Back pain    Diastolic dysfunction    a. 04/2018 Echo: EF 60-65%; b. 11/2022 Echo: EF 70-75%, no rwma, Gr I DD, nl RV fxn, triv MR.   GERD (gastroesophageal reflux disease)    History of asthma    HTN (hypertension)    Hyperlipidemia    Incarcerated umbilical hernia    a. 69/6295 s/p repair. Hosp course complicated by post operative ileus, abdominal wall hematoma requiring evacuation, hypoxic respiratory failure requiring intubation, sepsis, and A-fib with RVR.   Morbid obesity ([facility])    OSA (obstructive sleep apnea)    a. On BiPAP QHS.   PAF (paroxysmal atrial fibrillation) ([place])    a. 11/2022 - developed in setting of SBO/VDRF->briefly required amio. CHA2DS2VASc = 1-->No OAC for Afib.   Past Surgical History:  Procedure Laterality Date   bilateral hernia repair     total of 3    CYST REMOVAL NECK     age - 80s   HEMATOMA EVACUATION N/A 11/30/2022   Procedure: EVACUATION HEMATOMA;  Surgeon: Kyle Aus, DO;  Location: [facility];  Service: General;  Laterality: N/A;   UMBILICAL HERNIA REPAIR N/A 11/22/2022   Procedure: HERNIA REPAIR UMBILICAL ADULT,  WITH MESH;  Surgeon: Kyle Aus, DO;  Location: [facility];  Service: General;  Laterality: N/A;    Allergies  No Known  Allergies  History of Present Illness    57 year old male with above past medical history including hypertension, hyperlipidemia, obesity, GERD, obstructive sleep apnea, asthma, and 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  Patient was admitted to [facility] on December 9 with abdominal pain in the setting of bowel obstruction related to an incarcerated umbilical hernia.  Acute kidney injury was present with a creatinine greater than 2.  He underwent open umbilical hernia repair with mesh.  He subsequently developed abdominal distention in the setting of postoperative ileus as well as abdominal wall hematoma, which ultimately required evacuation.  In the setting of pain, he developed A-fib with RVR as well as worsening respiratory distress requiring intubation.  He was intubated for approximately 1 week.  His atrial fibrillation was managed with amiodarone digoxin he subsequently converted to sinus rhythm.  In the setting of a CHA2DS2-VASc of 1, he was not felt to require long-term anticoagulation.  He was treated with 7 days of Zosyn in the setting of hospital-acquired pneumonia.  He experienced normocytic anemia throughout his hospital admission but did not require transfusion.  Of note, CT imaging on admission showed a 4 mm right lung nodule with recommendation for outpatient follow-up if appropriate.  Following hospital discharge, Kyle Burns was taken off of his prior home doses of Maxide and spironolactone.  He was continued on Lasix 20 mg daily.  He notes a long history of lower extremity swelling  prompting prior diuretic doses.  He noted worsening lower extremity edema as well as right upper extremity edema in late December and presented to the emergency department on December 31.  D-dimer is elevated.  Lower extremity ultrasound was negative for DVT however, he was found to have a right subclavian and axillary vein DVT, felt to be secondary to prior PICC line placement.  He  was placed on Eliquis 10 mg twice daily x 7 days and is currently on Eliquis 5 mg twice daily.  In the setting of ongoing lower extremity edema, patient saw his primary care provider on January 8 and his Lasix dose was increased from 20 mg daily to 40 mg daily.  He has been wearing compression socks and has noted almost complete resolution of lower extremity edema.  He has not had any palpitations and denies chest pain, dyspnea, PND, orthopnea, dizziness, syncope, or early satiety.  We discussed his diet today and he does consume quite a bit of processed foods.  His mother is present with him today and says he snacks on chips regularly.  Of note, echocardiogram during admission showed hyperdynamic LV function with an EF of 70 to 75% without regional wall motion abnormalities, and grade 1 diastolic dysfunction.  Home Medications    Current Outpatient Medications  Medication Sig Dispense Refill   acetaminophen (TYLENOL) 325 MG tablet Take 2 tablets (650 mg total) by mouth every 6 (six) hours as needed for mild pain, fever or headache.     albuterol (VENTOLIN HFA) 108 (90 Base) MCG/ACT inhaler USE 2 PUFFS EVERY 6 HOURS AS NEEDED FOR WHEEZING (1 for home, 1 for work) (Patient taking differently: Inhale 2 puffs into the lungs every 6 (six) hours as needed for wheezing.) 36 g 4   APIXABAN (ELIQUIS) VTE STARTER PACK (10MG  AND 5MG ) Take as directed on package: start with two-5mg  tablets twice daily for 7 days. On day 8, switch to one-5mg  tablet twice daily. 1 each 0   atorvastatin (LIPITOR) 10 MG tablet Take 1 tablet (10 mg total) by mouth daily. 90 tablet 3   benzonatate (TESSALON PERLES) 100 MG capsule Take 1 capsule (100 mg total) by mouth 3 (three) times daily as needed. 20 capsule 0   budesonide-formoterol (SYMBICORT) 80-4.5 MCG/ACT inhaler Inhale 2 puffs into the lungs 2 (two) times daily. (Patient taking differently: Inhale 2 puffs into the lungs daily as needed (wheezing).) 30.6 g 4   docusate sodium  (COLACE) 100 MG capsule Take 100 mg by mouth daily.     esomeprazole (NEXIUM) 40 MG capsule Take 1 capsule (40 mg total) by mouth daily. (Patient taking differently: Take 40 mg by mouth in the morning.) 90 capsule 3   famotidine (PEPCID) 20 MG tablet One after supper (Patient taking differently: Take 20 mg by mouth every evening. After supper) 30 tablet 11   FLUoxetine (PROZAC) 20 MG capsule TAKE ONE (1) CAPSULE EACH DAY 90 capsule 0   fluticasone (FLONASE) 50 MCG/ACT nasal spray Place 2 sprays into both nostrils daily. (Patient taking differently: Place 2 sprays into both nostrils in the morning.) 16 g 6   furosemide (LASIX) 40 MG tablet Take 1 tablet (40 mg total) by mouth daily. 30 tablet 3   levocetirizine (XYZAL) 5 MG tablet TAKE 1 TABLET BY MOUTH EVERY EVENING 90 tablet 1   Multiple Vitamins-Minerals (ONE-A-DAY MENS 50+) TABS Take 1 tablet by mouth in the morning.     neomycin-bacitracin-polymyxin (NEOSPORIN) [phone] ointment Apply topically daily. Apply to your incision  site and belly button daily after bathing 28.3 g 0   montelukast (SINGULAIR) 10 MG tablet Take 1 tablet (10 mg total) by mouth at bedtime. 30 tablet 3   polyethylene glycol (MIRALAX / GLYCOLAX) 17 g packet Take 17 g by mouth daily as needed. (Patient not taking: Reported on 12/29/2022) 14 each 0   No current facility-administered medications for this visit.     Review of Systems    Mild lower extremity edema since increasing Lasix.  He denies chest pain, dyspnea, palpitations, PND, orthopnea, dizziness, syncope, or early satiety.  All other systems reviewed and are otherwise negative except as noted above.    Physical Exam    VS:  BP (!) 142/88   Pulse 76   Ht 5\' 11"  (1.803 m)   Wt 271 lb (122.9 kg)   SpO2 96%   BMI 37.80 kg/m  , BMI Body mass index is 37.8 kg/m.     Vitals:   12/29/22 1337 12/29/22 1503  BP: (!) 142/88 (!) 136/90  Pulse: 76   SpO2: 96%     GEN: This, in no acute distress. HEENT:  normal. Neck: Supple, obese, difficult to gauge JVP.  No bruits or masses.   Cardiac: RRR, no murmurs, rubs, or gallops. No clubbing, cyanosis, trace bilateral lower extremity edema.  Radials 2+/PT 2+ and equal bilaterally.  Respiratory:  Respirations regular and unlabored, clear to auscultation bilaterally. GI: Obese, soft, nontender, nondistended, BS + x 4. MS: no deformity or atrophy. Skin: warm and dry, no rash. Neuro:  Strength and sensation are intact. Psych: Normal affect.  Accessory Clinical Findings    Lab Results  Component Value Date   WBC 8.5 12/14/2022   HGB 9.7 (L) 12/14/2022   HCT 30.6 (L) 12/14/2022   MCV 91.6 12/14/2022   PLT 402 (H) 12/14/2022   Lab Results  Component Value Date   CREATININE 0.96 12/14/2022   BUN 12 12/14/2022   NA 141 12/14/2022   K 3.2 (L) 12/14/2022   CL 106 12/14/2022   CO2 29 12/14/2022   Lab Results  Component Value Date   ALT 89 (H) 12/04/2022   AST 50 (H) 12/04/2022   ALKPHOS 112 12/04/2022   BILITOT 0.6 12/04/2022   Lab Results  Component Value Date   CHOL 132 11/04/2021   HDL 47 11/04/2021   LDLCALC 69 11/04/2021   TRIG 212 (H) 12/04/2022   CHOLHDL 2.8 11/04/2021    Lab Results  Component Value Date   HGBA1C 5.7 (H) 11/04/2021    Assessment & Plan    1.  Paroxysmal atrial fibrillation: Patient admitted in December with incarcerated umbilical hernia and small bowel obstruction status post repair, acute kidney injury, respiratory failure requiring intubation, abdominal hematoma requiring evacuation, healthcare acquired pneumonia, sepsis, hypotension, and anemia.  In the setting of all of this, he experienced rapid atrial fibrillation which was managed with amiodarone and digoxin and subsequently converted to sinus rhythm.  Echo showed hyperdynamic LV function with an EF of 70-75% with grade 1 diastolic dysfunction.  No significant valvular disease.  His CHA2DS2-VASc is 1, and he was not felt to require either oral  anticoagulation or ongoing antiarrhythmic therapy at the time of his hospital discharge.  In the setting of right upper extremity DVT, he is now on apixaban but has not had any palpitations, chest pain, dyspnea, presyncope, or fatigue.  He is regular on examination today with a normal heart rate.  We discussed the pathophysiology and management of atrial  fibrillation, especially in the setting of acute illness.  He is already treated for sleep apnea.  No further workup warranted at this time.  Would benefit from regular exercise, caloric restriction, and weight loss.  2.  Diastolic dysfunction/chronic HFpEF: Patient with a history of lower extremity edema, which was previously managed with low-dose Lasix, Maxide, and spironolactone.  He also uses compression socks.  In the setting of acute kidney injury and hypotension, his Maxide and spironolactone were discontinued at the time of hospital discharge and he has been taking only Lasix 20 mg daily.  Lasix dose was increased to 40 mg daily on January 8 with improvement in lower extremity edema - only trace today.  I will follow-up a basic metabolic panel to ensure stability.  I do note that he was hypokalemic when seen in the emergency department on December 31.  3.  Right upper extremity DVT: Patient had a PICC line in the right upper extremity during hospitalization and in late December started noticing right arm swelling.  Ultrasound on December 31 confirmed a right subclavian and axillary vein DVT and he is now on Eliquis x at least 3 months.  4.  Essential hypertension: Blood pressure mildly elevated today-136/90 on repeat.  As noted, previously on Maxide and spironolactone in addition to low-dose Lasix.  I have sent in for a basic metabolic panel.  If labs are stable, I will plan to resume spironolactone with repeat labs in approximately 2 weeks.  5.  Hyperlipidemia: LDL of 69 on atorvastatin therapy.  6.  Asthma: Patient notes that he has had a cough  since hospital discharge.  His lungs are clear on examination.  His follow-up with pulmonology tomorrow.  7.  Obstructive sleep apnea: Compliant with BiPAP.  8.  Normocytic anemia: H&H 9.7 and 30.6 respectively on December 31.  Follow-up CBC today.  9.  Hypokalemia: Potassium 3.2 on December 31.  Follow-up today.  10.  Disposition: Follow-up CBC and basic metabolic panel today.  Follow-up in clinic in 2-3 months or sooner if necessary.   Murray Hodgkins, NP 12/29/2022, 2:57 PM

## 2022-12-30 ENCOUNTER — Encounter: Payer: Self-pay | Admitting: Internal Medicine

## 2022-12-30 ENCOUNTER — Ambulatory Visit: Payer: No Typology Code available for payment source | Admitting: Internal Medicine

## 2022-12-30 ENCOUNTER — Telehealth: Payer: Self-pay

## 2022-12-30 DIAGNOSIS — Z79899 Other long term (current) drug therapy: Secondary | ICD-10-CM

## 2022-12-30 DIAGNOSIS — J4520 Mild intermittent asthma, uncomplicated: Secondary | ICD-10-CM

## 2022-12-30 LAB — POCT EXHALED NITRIC OXIDE: FeNO level (ppb): 13

## 2022-12-30 MED ORDER — SPIRONOLACTONE 25 MG PO TABS: 25.0000 mg | ORAL_TABLET | Freq: Every day | ORAL | 3 refills | Status: DC

## 2022-12-30 MED ORDER — AMOXICILLIN-POT CLAVULANATE 875-125 MG PO TABS: 1.0000 | ORAL_TABLET | Freq: Two times a day (BID) | ORAL | 0 refills | Status: DC

## 2022-12-30 MED ORDER — PREDNISONE 10 MG PO TABS: ORAL_TABLET | ORAL | 0 refills | Status: DC

## 2022-12-30 MED ORDER — POTASSIUM CHLORIDE CRYS ER 20 MEQ PO TBCR: EXTENDED_RELEASE_TABLET | ORAL | 0 refills | Status: DC

## 2022-12-30 NOTE — Patient Instructions (Addendum)
Hold off on symbicort for now and just use albuterol   as needed for cough/ wheeze/ short of breath  Prednisone 10 mg take  4 each am x 2 days,   2 each am x 2 days,  1 each am x 2 days and stop   Augmentin 875 mg take one pill twice daily  X 10 days - take at breakfast and supper with large glass of water.  It would help reduce the usual side effects (diarrhea and yeast infections) if you ate cultured yogurt at lunch.   Please schedule a follow up office visit in 2 weeks, call sooner if needed with all medications /inhalers/ solutions in hand so we can verify exactly what you are taking. This includes all medications from all doctors and over the [place] separate them into two bags:  the ones you take automatically, no matter what, vs the ones you take just when you feel you need them "BAG #2 is UP TO YOU"  - this will really help [place] help you take your medications more effectively.

## 2022-12-30 NOTE — Telephone Encounter (Signed)
I spoke with patient.he will take one dose of potassium and repeat bmet in 1 week.He will alos resume aldactone 25 mg qd. I will copy his pcp

## 2022-12-30 NOTE — Telephone Encounter (Signed)
-----  Message from Theora Gianotti, NP sent at 12/29/2022  4:46 PM EST ----- Kidney function is stable/normal.  Potassium remains low since his ER visit-3.1.  Blood counts have improved-hemoglobin 11.2 with hematocrit of 35.2.  I recommend 1 dose of potassium chloride 40 mEq with resumption of spironolactone at 25 mg daily (he was on this at home previously).  Follow-up basic metabolic panel in 1 week.

## 2022-12-31 NOTE — Assessment & Plan Note (Addendum)
Onset in childhood ?  / played sports fine s needing saba - 04/02/2022    symbicort 80 2bid  - 09/24/2022  After extensive coaching inhaler device,  effectiveness =   80% HFa> continue symbicort and max rx for gerd - Allergy screen 09/24/2022 >  Eos 0.1 /  IgE 25  - FENO 12/30/2022  = 13 off symbicort    Nothing to suggest this is asthma related coughing and more likely Upper airway cough syndrome (previously labeled PNDS),  is so named because it's frequently impossible to sort out how much is  CR/sinusitis with freq throat clearing (which can be related to primary GERD)   vs  causing  secondary (" extra esophageal")  GERD from wide swings in gastric pressure that occur with throat clearing, often  promoting self use of mint and menthol lozenges that reduce the lower esophageal sphincter tone and exacerbate the problem further in a cyclical fashion.   These are the same pts (now being labeled as having "irritable larynx syndrome" by some cough centers) who not infrequently have a history of having failed to tolerate ace inhibitors,  dry powder inhalers or biphosphonates or report having atypical/extraesophageal reflux symptoms that don't respond to standard doses of PPI  and are easily confused as having aecopd or asthma flares by even experienced allergists/ pulmonologists (myself included).   Rec Augmentin for possible sinusitis  Max gerd rx  >>> also so added 6 day taper off  Prednisone starting at 40 mg per day in case of component of Th-2 driven upper or lower airways inflammation (if cough responds short term only to relapse before return while will on full rx for uacs (as above), then  that would point to allergic rhinitis/ asthma or eos bronchitis as alternative dx)  Just use saba prn sob/ wheeze and leave off symbicort x 2 weeks then return to regroup with all meds in hand using a trust but verify approach to confirm accurate Medication  Reconciliation The principal here is that until we are  certain that the  patients are doing what we've asked, it makes no sense to ask them to do more.          Each maintenance medication was reviewed in detail including emphasizing most importantly the difference between maintenance and prns and under what circumstances the prns are to be triggered using an action plan format where appropriate.  Total time for H and P, chart review, counseling, reviewing hfa device(s) and generating customized AVS unique to this office visit / same day charting  > 30 min for   refractory respiratory  symptoms of uncertain etiology

## 2023-01-03 ENCOUNTER — Encounter: Payer: Self-pay | Admitting: Family Medicine

## 2023-01-05 ENCOUNTER — Encounter: Payer: Self-pay | Admitting: Family Medicine

## 2023-01-05 NOTE — Telephone Encounter (Signed)
Yes switch back to 20mg 

## 2023-01-06 ENCOUNTER — Encounter: Payer: No Typology Code available for payment source | Admitting: Surgery

## 2023-01-06 ENCOUNTER — Other Ambulatory Visit (HOSPITAL_COMMUNITY)
Admission: RE | Admit: 2023-01-06 | Discharge: 2023-01-06 | Disposition: A | Discharge status: Home / Self Care | Payer: No Typology Code available for payment source | Source: Ambulatory Visit | Attending: Nurse Practitioner | Admitting: Nurse Practitioner

## 2023-01-06 DIAGNOSIS — Z79899 Other long term (current) drug therapy: Secondary | ICD-10-CM | POA: Insufficient documentation

## 2023-01-06 LAB — BASIC METABOLIC PANEL
Anion gap: 9 (ref 5–15)
BUN: 16 mg/dL (ref 6–20)
CO2: 30 mmol/L (ref 22–32)
Calcium: 9.3 mg/dL (ref 8.9–10.3)
Chloride: 103 mmol/L (ref 98–111)
Creatinine, Ser: 1.12 mg/dL (ref 0.61–1.24)
GFR, Estimated: >60 mL/min (ref >60)
Glucose, Bld: 103 mg/dL — ABNORMAL HIGH (ref 70–99)
Potassium: 3.5 mmol/L (ref 3.5–5.1)
Sodium: 142 mmol/L (ref 135–145)

## 2023-01-10 ENCOUNTER — Other Ambulatory Visit: Payer: Self-pay | Admitting: Family Medicine

## 2023-01-10 DIAGNOSIS — E782 Mixed hyperlipidemia: Secondary | ICD-10-CM

## 2023-01-12 ENCOUNTER — Encounter: Payer: Self-pay | Admitting: Family Medicine

## 2023-01-12 ENCOUNTER — Ambulatory Visit (INDEPENDENT_AMBULATORY_CARE_PROVIDER_SITE_OTHER): Payer: No Typology Code available for payment source | Admitting: Family Medicine

## 2023-01-12 VITALS — BP 131/79 | HR 79 | Temp 98.9°F | Ht 71.0 in | Wt 272.0 lb

## 2023-01-12 DIAGNOSIS — Z0001 Encounter for general adult medical examination with abnormal findings: Secondary | ICD-10-CM | POA: Diagnosis not present

## 2023-01-12 DIAGNOSIS — Z6841 Body Mass Index (BMI) 40.0 and over, adult: Secondary | ICD-10-CM | POA: Diagnosis not present

## 2023-01-12 DIAGNOSIS — E782 Mixed hyperlipidemia: Secondary | ICD-10-CM

## 2023-01-12 DIAGNOSIS — R7303 Prediabetes: Secondary | ICD-10-CM

## 2023-01-12 DIAGNOSIS — F418 Other specified anxiety disorders: Secondary | ICD-10-CM

## 2023-01-12 DIAGNOSIS — K219 Gastro-esophageal reflux disease without esophagitis: Secondary | ICD-10-CM

## 2023-01-12 DIAGNOSIS — Z125 Encounter for screening for malignant neoplasm of prostate: Secondary | ICD-10-CM

## 2023-01-12 DIAGNOSIS — Z0000 Encounter for general adult medical examination without abnormal findings: Secondary | ICD-10-CM

## 2023-01-12 DIAGNOSIS — I10 Essential (primary) hypertension: Secondary | ICD-10-CM

## 2023-01-12 DIAGNOSIS — I82621 Acute embolism and thrombosis of deep veins of right upper extremity: Secondary | ICD-10-CM

## 2023-01-12 DIAGNOSIS — I480 Paroxysmal atrial fibrillation: Secondary | ICD-10-CM

## 2023-01-12 DIAGNOSIS — J3089 Other allergic rhinitis: Secondary | ICD-10-CM

## 2023-01-12 LAB — BAYER DCA HB A1C WAIVED: HB A1C (BAYER DCA - WAIVED): 5.6 % (ref 4.8–5.6)

## 2023-01-12 LAB — LIPID PANEL

## 2023-01-12 MED ORDER — LEVOCETIRIZINE DIHYDROCHLORIDE 5 MG PO TABS: 5.0000 mg | ORAL_TABLET | Freq: Every evening | ORAL | 3 refills | Status: DC

## 2023-01-12 MED ORDER — MONTELUKAST SODIUM 10 MG PO TABS: 10.0000 mg | ORAL_TABLET | Freq: Every day | ORAL | 3 refills | Status: DC

## 2023-01-12 MED ORDER — ESOMEPRAZOLE MAGNESIUM 40 MG PO CPDR: 40.0000 mg | DELAYED_RELEASE_CAPSULE | Freq: Every morning | ORAL | 3 refills | Status: DC

## 2023-01-12 MED ORDER — APIXABAN 5 MG PO TABS: 5.0000 mg | ORAL_TABLET | Freq: Two times a day (BID) | ORAL | 0 refills | Status: DC

## 2023-01-12 MED ORDER — ATORVASTATIN CALCIUM 10 MG PO TABS: 10.0000 mg | ORAL_TABLET | Freq: Every day | ORAL | 3 refills | Status: DC

## 2023-01-12 MED ORDER — APIXABAN 5 MG PO TABS: 5.0000 mg | ORAL_TABLET | Freq: Two times a day (BID) | ORAL | 3 refills | Status: DC

## 2023-01-12 MED ORDER — FLUOXETINE HCL 20 MG PO CAPS: 20.0000 mg | ORAL_CAPSULE | Freq: Every day | ORAL | 3 refills | Status: DC

## 2023-01-12 NOTE — Patient Instructions (Signed)
Atrial Fibrillation  Atrial fibrillation is a type of heartbeat that is irregular or fast. If you have this condition, your heart beats without any order. This makes it hard for your heart to pump blood in a normal way. Atrial fibrillation may come and go, or it may become a long-lasting problem. If this condition is not treated, it can put you at higher risk for stroke, heart failure, and other heart problems. What are the causes? This condition may be caused by diseases that damage the heart. They include: High blood pressure. Heart failure. Heart valve disease. Heart surgery. Other causes include: Diabetes. Thyroid disease. Being overweight. Kidney disease. Sometimes the cause is not known. What increases the risk? You are more likely to develop this condition if: You are older. You smoke. You exercise often and very hard. You have a family history of this condition. You are a man. You use drugs. You drink a lot of alcohol. You have lung conditions, such as emphysema, pneumonia, or COPD. You have sleep apnea. What are the signs or symptoms? Common symptoms of this condition include: A feeling that your heart is beating very fast. Chest pain or discomfort. Feeling short of breath. Suddenly feeling light-headed or weak. Getting tired easily during activity. Fainting. Sweating. In some cases, there are no symptoms. How is this treated? Treatment for this condition depends on underlying conditions and how you feel when you have atrial fibrillation. They include: Medicines to: Prevent blood clots. Treat heart rate or heart rhythm problems. Using devices, such as a pacemaker, to correct heart rhythm problems. Doing surgery to remove the part of the heart that sends bad signals. Closing an area where clots can form in the heart (left atrial appendage). In some cases, your doctor will treat other underlying conditions. Follow these instructions at home: Medicines Take  over-the-counter and prescription medicines only as told by your doctor. Do not take any new medicines without first talking to your doctor. If you are taking blood thinners: Talk with your doctor before you take any medicines that have aspirin or NSAIDs, such as ibuprofen, in them. Take your medicine exactly as told by your doctor. Take it at the same time each day. Avoid activities that could hurt or bruise you. Follow instructions about how to prevent falls. Wear a bracelet that says you are taking blood thinners. Or, carry a card that lists what medicines you take. Lifestyle     Do not use any products that have nicotine or tobacco in them. These include cigarettes, e-cigarettes, and chewing tobacco. If you need help quitting, ask your doctor. Eat heart-healthy foods. Talk with your doctor about the right eating plan for you. Exercise regularly as told by your doctor. Do not drink alcohol. Lose weight if you are overweight. Do not use drugs, including cannabis. General instructions If you have a condition that causes breathing to stop for a short period of time (apnea), treat it as told by your doctor. Keep a healthy weight. Do not use diet pills unless your doctor says they are safe for you. Diet pills may make heart problems worse. Keep all follow-up visits as told by your doctor. This is important. Contact a doctor if: You notice a change in the speed, rhythm, or strength of your heartbeat. You are taking a blood-thinning medicine and you get more bruising. You get tired more easily when you move or exercise. You have a sudden change in weight. Get help right away if:  You have pain in   your chest or your belly (abdomen). You have trouble breathing. You have side effects of blood thinners, such as blood in your vomit, poop (stool), or pee (urine), or bleeding that cannot stop. You have any signs of a stroke. "BE FAST" is an easy way to remember the main warning signs: B -  Balance. Signs are dizziness, sudden trouble walking, or loss of balance. E - Eyes. Signs are trouble seeing or a change in how you see. F - Face. Signs are sudden weakness or loss of feeling in the face, or the face or eyelid drooping on one side. A - Arms. Signs are weakness or loss of feeling in an arm. This happens suddenly and usually on one side of the body. S - Speech. Signs are sudden trouble speaking, slurred speech, or trouble understanding what people say. T - Time. Time to call emergency services. Write down what time symptoms started. You have other signs of a stroke, such as: A sudden, very bad headache with no known cause. Feeling like you may vomit (nausea). Vomiting. A seizure. These symptoms may be an emergency. Do not wait to see if the symptoms will go away. Get medical help right away. Call your local emergency services (911 in the [place] Do not drive yourself to the hospital. Summary Atrial fibrillation is a type of heartbeat that is irregular or fast. You are at higher risk of this condition if you smoke, are older, have diabetes, or are overweight. Follow your doctor's instructions about medicines, diet, exercise, and follow-up visits. Get help right away if you have signs or symptoms of a stroke. Get help right away if you cannot catch your breath, or you have chest pain or discomfort. This information is not intended to replace advice given to you by your health care provider. Make sure you discuss any questions you have with your health care provider. Document Revised: 05/25/2019 Document Reviewed: 05/25/2019 Elsevier Patient Education  2023 Elsevier Inc.  

## 2023-01-12 NOTE — Progress Notes (Signed)
Kyle Burns is a 57 y.o. male presents to office today for annual physical exam examination.    Concerns today include: 1.  No new concerns today.  Since her last visit he has had hernia surgery and he subsequently ended up in the ICU.  He also has developed atrial fibrillation and an acute DVT in the right upper extremity.  Currently treated with Eliquis.  He is on the starter pack for this.  Denies any bleeding, heart palpitations.  Has had some postnasal drainage and is compliant with his antihistamine but is not on Singulair.  Interested in starting this.  Currently under the care  of a pulmonologist and cardiology.  He also reports me today that his sister passed away and he is now caring for her 38-year-old son.  Occupation: Engineer, structural, Substance use: none Diet: typical [place], Exercise: no structured Last colonoscopy: 04/10/2015 Refills needed today: all Immunizations needed: none Immunization History  Administered Date(s) Administered   Influenza Split 10/15/2013, 09/14/2014, 08/30/2015   Influenza, Quadrivalent, Recombinant, Inj, Pf 09/27/2019, 09/29/2022   Influenza,inj,Quad PF,6+ Mos 10/02/2016, 09/21/2017, 09/03/2020   Influenza-Unspecified 10/15/2013, 09/14/2014, 09/29/2014, 08/30/2015, 10/14/2017   Moderna SARS-COV2 Booster Vaccination 11/05/2020   Moderna Sars-Covid-2 Vaccination 03/22/2020, 04/19/2020   Pneumococcal Polysaccharide-23 12/15/2010   Tdap 03/20/2016     Past Medical History:  Diagnosis Date   Arm DVT (deep venous thromboembolism), acute, right ([place])    a. 12/2022 U/S R subclavian and axillary DVT (had PICC line during hosp in 11/2022).  Eliquis initiated.   Back pain    Diastolic dysfunction    a. 04/2018 Echo: EF 60-65%; b. 11/2022 Echo: EF 70-75%, no rwma, Gr I DD, nl RV fxn, triv MR.   GERD (gastroesophageal reflux disease)    History of asthma    HTN (hypertension)    Hyperlipidemia    Incarcerated umbilical hernia    a. 37/9024 s/p  repair. Hosp course complicated by post operative ileus, abdominal wall hematoma requiring evacuation, hypoxic respiratory failure requiring intubation, sepsis, and A-fib with RVR.   Morbid obesity (HCC)    OSA (obstructive sleep apnea)    a. On BiPAP QHS.   PAF (paroxysmal atrial fibrillation) ([place])    a. 11/2022 - developed in setting of SBO/VDRF->briefly required amio. CHA2DS2VASc = 1-->No OAC for Afib.   Social History   Socioeconomic History   Marital status: Single    Spouse name: Not on file   Number of children: 0   Years of education: Not on file   Highest education level: Not on file  Occupational History   Not on file  Tobacco Use   Smoking status: Never   Smokeless tobacco: Former    Types: Chew    Quit date: 10/2019   Tobacco comments:    Uses chew "when he has a bad day"  Vaping Use   Vaping Use: Never used  Substance and Sexual Activity   Alcohol use: No    Comment: Socially   Drug use: No   Sexual activity: Not on file  Other Topics Concern   Not on file  Social History Narrative   Not on file   Social Determinants of Health   Financial Resource Strain: Not on file  Food Insecurity: No Food Insecurity (11/22/2022)   Hunger Vital Sign    Worried About Running Out of Food in the Last Year: Never true    Ran Out of Food in the Last Year: Never true  Transportation Needs: No Transportation Needs (  11/22/2022)   PRAPARE - Hydrologist (Medical): No    Lack of Transportation (Non-Medical): No  Physical Activity: Not on file  Stress: Not on file  Social Connections: Not on file  Intimate Partner Violence: Not At Risk (11/22/2022)   Humiliation, Afraid, Rape, and Kick questionnaire    Fear of Current or Ex-Partner: No    Emotionally Abused: No    Physically Abused: No    Sexually Abused: No   Past Surgical History:  Procedure Laterality Date   bilateral hernia repair     total of 3    CYST REMOVAL NECK     age - 15s    HEMATOMA EVACUATION N/A 11/30/2022   Procedure: EVACUATION HEMATOMA;  Surgeon: Rusty Aus, DO;  Location: [facility];  Service: General;  Laterality: N/A;   UMBILICAL HERNIA REPAIR N/A 11/22/2022   Procedure: HERNIA REPAIR UMBILICAL ADULT,  WITH MESH;  Surgeon: Rusty Aus, DO;  Location: [facility];  Service: General;  Laterality: N/A;   Family History  Problem Relation Age of Onset   Asthma Father    Cancer Father        stomach   Arthritis Father    Asthma Maternal Grandfather    Diabetes Maternal Grandfather    Stroke Maternal Grandfather    Asthma Paternal Grandfather    Crohn's disease Mother    Arthritis Mother        back pain    Diabetes Mother    Hypertension Sister    Multiple sclerosis Brother    Hypertension Maternal Grandmother     Current Outpatient Medications:    acetaminophen (TYLENOL) 325 MG tablet, Take 2 tablets (650 mg total) by mouth every 6 (six) hours as needed for mild pain, fever or headache., Disp: , Rfl:    albuterol (VENTOLIN HFA) 108 (90 Base) MCG/ACT inhaler, USE 2 PUFFS EVERY 6 HOURS AS NEEDED FOR WHEEZING (1 for home, 1 for work) (Patient taking differently: Inhale 2 puffs into the lungs every 6 (six) hours as needed for wheezing.), Disp: 36 g, Rfl: 4   APIXABAN (ELIQUIS) VTE STARTER PACK (10MG  AND 5MG ), Take as directed on package: start with two-5mg  tablets twice daily for 7 days. On day 8, switch to one-5mg  tablet twice daily., Disp: 1 each, Rfl: 0   atorvastatin (LIPITOR) 10 MG tablet, Take 1 tablet (10 mg total) by mouth daily., Disp: 90 tablet, Rfl: 3   benzonatate (TESSALON PERLES) 100 MG capsule, Take 1 capsule (100 mg total) by mouth 3 (three) times daily as needed., Disp: 20 capsule, Rfl: 0   budesonide-formoterol (SYMBICORT) 80-4.5 MCG/ACT inhaler, Inhale 2 puffs into the lungs 2 (two) times daily. (Patient taking differently: Inhale 2 puffs into the lungs daily as needed (wheezing).), Disp: 30.6 g, Rfl: 4   docusate sodium  (COLACE) 100 MG capsule, Take 100 mg by mouth daily., Disp: , Rfl:    esomeprazole (NEXIUM) 40 MG capsule, Take 1 capsule (40 mg total) by mouth daily. (Patient taking differently: Take 40 mg by mouth in the morning.), Disp: 90 capsule, Rfl: 3   famotidine (PEPCID) 20 MG tablet, One after supper (Patient taking differently: Take 20 mg by mouth every evening. After supper), Disp: 30 tablet, Rfl: 11   FLUoxetine (PROZAC) 20 MG capsule, TAKE ONE (1) CAPSULE EACH DAY, Disp: 90 capsule, Rfl: 0   fluticasone (FLONASE) 50 MCG/ACT nasal spray, Place 2 sprays into both nostrils daily. (Patient taking differently: Place 2 sprays into  both nostrils in the morning.), Disp: 16 g, Rfl: 6   levocetirizine (XYZAL) 5 MG tablet, TAKE 1 TABLET BY MOUTH EVERY EVENING, Disp: 90 tablet, Rfl: 1   Multiple Vitamins-Minerals (ONE-A-DAY MENS 50+) TABS, Take 1 tablet by mouth in the morning., Disp: , Rfl:    neomycin-bacitracin-polymyxin (NEOSPORIN) [phone] ointment, Apply topically daily. Apply to your incision site and belly button daily after bathing, Disp: 28.3 g, Rfl: 0   polyethylene glycol (MIRALAX / GLYCOLAX) 17 g packet, Take 17 g by mouth daily as needed., Disp: 14 each, Rfl: 0   potassium chloride SA (KLOR-CON M) 20 MEQ tablet, Take 2 tablets (40 meq total) for one time, Disp: 2 tablet, Rfl: 0   spironolactone (ALDACTONE) 25 MG tablet, Take 1 tablet (25 mg total) by mouth daily., Disp: 90 tablet, Rfl: 3  No Known Allergies   ROS: Review of Systems A comprehensive review of systems was negative except for: Ears, nose, mouth, throat, and face: positive for nasal congestion and post nasal drip Respiratory: positive for asthma    Physical exam BP 131/79   Pulse 79   Temp 98.9 F (37.2 C)   Ht 5\' 11"  (1.803 m)   Wt 272 lb (123.4 kg)   SpO2 93%   BMI 37.94 kg/m  General appearance: alert, cooperative, appears stated age, no distress, and morbidly obese Head: Normocephalic, without obvious abnormality,  atraumatic Eyes: negative findings: lids and lashes normal, conjunctivae and sclerae normal, corneas clear, and pupils equal, round, reactive to light and accomodation Ears: normal TM's and external ear canals both ears Nose: Nares normal. Septum midline. Mucosa normal. No drainage or sinus tenderness. Throat: lips, mucosa, and tongue normal; teeth and gums normal Neck: no adenopathy, supple, symmetrical, trachea midline, and thyroid not enlarged, symmetric, no tenderness/mass/nodules Back: symmetric, no curvature. ROM normal. No CVA tenderness. Lungs: clear to auscultation bilaterally Heart: regular rate and rhythm, S1, S2 normal, no murmur, click, rub or gallop Abdomen: soft, non-tender; bowel sounds normal; no masses,  no organomegaly; protuberant Extremities: extremities normal, atraumatic, no cyanosis or edema Pulses: 2+ and symmetric Skin: Skin color, texture, turgor normal. No rashes or lesions Lymph nodes: Cervical, supraclavicular, and axillary nodes normal. Neurologic: Grossly normal Psych: Mood stable, speech normal, affect appropriate.  Very pleasant and interactive   Assessment/ Plan: here for annual physical exam.   Annual physical exam  Essential hypertension with goal blood pressure less than 130/80 - Plan: CMP14+EGFR  BMI 40.0-44.9, adult (HCC) - Plan: CMP14+EGFR  Mixed hyperlipidemia - Plan: CMP14+EGFR, Lipid Panel, atorvastatin (LIPITOR) 10 MG tablet  Paroxysmal A-fib (HCC) - Plan: CBC, CMP14+EGFR, apixaban (ELIQUIS) 5 MG TABS tablet, apixaban (ELIQUIS) 5 MG TABS tablet  Arm DVT (deep venous thromboembolism), acute, right (HCC) - Plan: CBC, CMP14+EGFR, apixaban (ELIQUIS) 5 MG TABS tablet, apixaban (ELIQUIS) 5 MG TABS tablet  Prediabetes - Plan: Bayer DCA Hb A1c Waived  Screening for malignant neoplasm of prostate - Plan: PSA  Situational anxiety - Plan: FLUoxetine (PROZAC) 20 MG capsule  Gastroesophageal reflux disease without esophagitis -  Plan: esomeprazole (NEXIUM) 40 MG capsule  Non-seasonal allergic rhinitis, unspecified trigger - Plan: montelukast (SINGULAIR) 10 MG tablet, levocetirizine (XYZAL) 5 MG tablet  Patient has had quite a year, he seems to have fully recovered from his hernia surgery and the complications that followed.  His heart was a rate and rhythm controlled today and he seemed to be well-appearing from a DVT standpoint.  I have extended his Eliquis indefinitely given the  atrial fibrillation.  He was given a 30-day free coupon card and the remainder of the Rx was sent as a separate prescription.  We discussed risks and benefits of this chronic anticoagulation.  I would like to reassess him in 6 months for repeat CBC but today we will also grab that with his fasting labs.  Prediabetes seems to be in remission as his A1c was less than 5.7 today.  Asymptomatic from a prostate standpoint.  Check PSA  Mood is stable and he seems to be acclimating to caring for his 40-year-old nephew without difficulty at this time.  Continue SSRI  GERD is chronic and stable.  PPI renewed  Allergic rhinitis slightly active so we will refill the Singulair.  He will continue the Xyzal and nasal spray.  Counseled on healthy lifestyle choices, including diet (rich in fruits, vegetables and lean meats and low in salt and simple carbohydrates) and exercise (at least 30 minutes of moderate physical activity daily).  Patient to follow up in 47m  Kyo Cocuzza M. Nadine Counts, DO

## 2023-01-13 ENCOUNTER — Other Ambulatory Visit: Payer: Self-pay | Admitting: Family Medicine

## 2023-01-13 DIAGNOSIS — E782 Mixed hyperlipidemia: Secondary | ICD-10-CM

## 2023-01-13 LAB — LIPID PANEL
Chol/HDL Ratio: 3.7 ratio (ref 0.0–5.0)
Cholesterol, Total: 157 mg/dL (ref 100–199)
HDL: 42 mg/dL (ref >39)
LDL Chol Calc (NIH): 97 mg/dL (ref 0–99)
Triglycerides: 99 mg/dL (ref 0–149)
VLDL Cholesterol Cal: 18 mg/dL (ref 5–40)

## 2023-01-13 LAB — CBC
Hematocrit: 37.7 % (ref 37.5–51.0)
Hemoglobin: 12.3 g/dL — ABNORMAL LOW (ref 13.0–17.7)
MCH: 28.3 pg (ref 26.6–33.0)
MCHC: 32.6 g/dL (ref 31.5–35.7)
MCV: 87 fL (ref 79–97)
Platelets: 335 10*3/uL (ref 150–450)
RBC: 4.35 x10E6/uL (ref 4.14–5.80)
RDW: 12.7 % (ref 11.6–15.4)
WBC: 7.7 10*3/uL (ref 3.4–10.8)

## 2023-01-13 LAB — CMP14+EGFR
ALT: 22 IU/L (ref 0–44)
AST: 21 IU/L (ref 0–40)
Albumin/Globulin Ratio: 2 (ref 1.2–2.2)
Albumin: 4.7 g/dL (ref 3.8–4.9)
Alkaline Phosphatase: 114 IU/L (ref 44–121)
BUN/Creatinine Ratio: 14 (ref 9–20)
BUN: 14 mg/dL (ref 6–24)
Bilirubin Total: 0.5 mg/dL (ref 0.0–1.2)
CO2: 23 mmol/L (ref 20–29)
Calcium: 9.4 mg/dL (ref 8.7–10.2)
Chloride: 106 mmol/L (ref 96–106)
Creatinine, Ser: 0.99 mg/dL (ref 0.76–1.27)
Globulin, Total: 2.4 g/dL (ref 1.5–4.5)
Glucose: 113 mg/dL — ABNORMAL HIGH (ref 70–99)
Potassium: 3.4 mmol/L — ABNORMAL LOW (ref 3.5–5.2)
Sodium: 145 mmol/L — ABNORMAL HIGH (ref 134–144)
Total Protein: 7.1 g/dL (ref 6.0–8.5)
eGFR: 88 mL/min/{1.73_m2} (ref >59)

## 2023-01-13 LAB — PSA: Prostate Specific Ag, Serum: 0.5 ng/mL (ref 0.0–4.0)

## 2023-01-13 MED ORDER — ATORVASTATIN CALCIUM 20 MG PO TABS: 20.0000 mg | ORAL_TABLET | Freq: Every day | ORAL | 3 refills | Status: DC

## 2023-01-18 ENCOUNTER — Other Ambulatory Visit: Payer: Self-pay | Admitting: Family Medicine

## 2023-01-18 DIAGNOSIS — I10 Essential (primary) hypertension: Secondary | ICD-10-CM

## 2023-01-18 NOTE — Progress Notes (Unsigned)
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 Asthma as child no problem as adolescent  self referred to pulmonary clinic in [place]  04/02/2022  with documented L HD immobility and MO by BMI c/b OSA on cpap      History of Present Illness  04/02/2022  Pulmonary/ 1st office eval/ Kyle Burns / [place] [facility] was maintained on symbicort previously but off x over  a month s change in symptoms  Chief Complaint  Patient presents with   New Patient (Initial Visit)    Has seen Dr. [place] Bells.  Feels asthma is going well.   Dyspnea:  not sure symbicort really helps  Cough: none  SABA use: last used a month prior to OV   Rec Symbicort 80 can be used up to 2 puffs  first thing in am and 2 puff every every 12 hours and then wean off after a week Only use your albuterol as a rescue medication Ok to try albuterol 15 min before an activity (on alternating days)  that you know would usually make you short of breath  Work on inhaler technique  05/22/22  [place] eval for osa > rx Bipap /02  09/24/2022  f/u ov/[place] office/Kyle Burns re: asthma maint on symbicort 80 2bid   Chief Complaint  Patient presents with   Follow-up    Cough is still bothering patient. Dry cough most of the time but sometimes productive   Dyspnea:  ok flat surface / problems with hills  = MMRC1 = can walk nl pace, flat grade, can't hurry or go uphills or steps s sob   Cough: worse with speaking or deep breath x one month   Sleeping: on Bipap  / 02  x 4h  feels rested but not getting enough pressure  Supine/ 4 pillows  has gained 10 lbs since starting  SABA use: qod  02: none daytime/  2lpm hs  Covid status: all but new one  Rec Dr Halford Chessman next available to troubleshoot your bipap problems Continue nexium but take it 30-60 before 1st meal and add  after supper  Pepcid 20 mg  GERD diet Work on inhaler technique:   Try delsym 2 tsp twice daily as  needed for cough   Allergy screen 09/24/2022 >  Eos 0.1 /  IgE 25       11/14/2022  f/u ov/Kyle Burns re: doe/cough maint   off symbicort x one week and cough maybe some better / convinced this  is pnds triggered  Chief Complaint  Patient presents with   Follow-up    Cough persistent  Dyspnea:  no change ex tol on vs off symbicort Cough: mostly dry/ sense of pnds worse before supper and at bedime  Sleeping: not disturbing sleep / bed= flat waterbed / 2 pillows  SABA use: none  02: for bipap   Covid status:   vax x last one,? If ever infected Rec Depomedrol 120 mg IM  Stay on nexium and pepcid as you are  GERD diet reviewed, bed blocks rec  For drainage / throat tickle try take CHLORPHENIRAMINE  4 mg  Stop symicort and just use albuterol as needed for now Please schedule a follow up office visit in 6 weeks, call sooner if needed with all medications /inhalers/ solutions in hand    Late add: if not improving > tessalon 200    12/30/2022  f/u ov/[place] office/Kyle Burns re: cough x 08/2022   maint on gerd rx   Chief Complaint  Patient presents with   Follow-up    Cough with clear sputum but sometimes green tint.  Sore throat for about a week   Dyspnea:  walking fine Cough: onset was 08/2022/ more productive recently / slt greenish secretions Sleeping: on bipap / raised up on couch since discharge from abd surgery complicated by prolonged vent  Waking up sev times a night coughing SABA use: albuterol seem to help soe 02: 3lpm at hs with bipap  Rec Hold off on symbicort for now and just use albuterol   as needed for cough/ wheeze/ short of breath Prednisone 10 mg take  4 each am x 2 days,   2 each am x 2 days,  1 each am x 2 days and stop  Augmentin 875 mg take one pill twice daily  X 10 days   Please schedule a follow up office visit in 2 weeks, call sooner if needed with all medications /inhalers/ solutions in hand    01/20/2023  f/u ov/Kyle Burns re: ***   maint on ***  No chief complaint on  file.   Dyspnea:  *** Cough: *** Sleeping: *** SABA use: *** 02: *** Covid status:   *** Lung cancer screening :  ***    No obvious day to day or daytime variability or assoc excess/ purulent sputum or mucus plugs or hemoptysis or cp or chest tightness, subjective wheeze or overt sinus or hb symptoms.   *** without nocturnal  or early am exacerbation  of respiratory  c/o's or need for noct saba. Also denies any obvious fluctuation of symptoms with weather or environmental changes or other aggravating or alleviating factors except as outlined above   No unusual exposure hx or h/o childhood pna/ asthma or knowledge of premature birth.  Current Allergies, Complete Past Medical History, Past Surgical History, Family History, and Social History were reviewed in [employer] record.  ROS  The following are not active complaints unless bolded Hoarseness, sore throat, dysphagia, dental problems, itching, sneezing,  nasal congestion or discharge of excess mucus or purulent secretions, ear ache,   fever, chills, sweats, unintended wt loss or wt gain, classically pleuritic or exertional cp,  orthopnea pnd or arm/hand swelling  or leg swelling, presyncope, palpitations, abdominal pain, anorexia, nausea, vomiting, diarrhea  or change in bowel habits or change in bladder habits, change in stools or change in urine, dysuria, hematuria,  rash, arthralgias, visual complaints, headache, numbness, weakness or ataxia or problems with walking or coordination,  change in mood or  memory.        No outpatient medications have been marked as taking for the 01/20/23 encounter (Appointment) with Tanda Rockers, MD.                 Past Medical History:  Diagnosis Date   Asthma    hx of    Back pain    Bilevel positive airway pressure (BPAP) dependence    GERD (gastroesophageal reflux disease)    HTN (hypertension)    Hyperlipidemia         Objective:    Wts  01/20/2023          ***  12/30/2022      270 11/14/2022      295   09/24/22 295 lb (133.8 kg)  09/12/22 293 lb (132.9 kg)  09/01/22 300 lb (136.1 kg)  Vital signs reviewed  01/20/2023  - Note at rest 02 sats  ***% on ***   General appearance:    ***       Assessment

## 2023-01-19 ENCOUNTER — Telehealth: Payer: Self-pay | Admitting: Family Medicine

## 2023-01-19 ENCOUNTER — Other Ambulatory Visit: Payer: Self-pay | Admitting: Nurse Practitioner

## 2023-01-19 MED ORDER — FUROSEMIDE 20 MG PO TABS: 20.0000 mg | ORAL_TABLET | Freq: Every day | ORAL | 0 refills | Status: DC

## 2023-01-19 NOTE — Telephone Encounter (Signed)
TC from pt, he is at pharmacy regarding RF on his Furosemide I denied refill this morning, reason: DC'd 12/22/22 came up on my side and looked in med review & it is not on his current med list. Now that I look over his chart again, Ronnald Collum, FNP increased his Furosemide to 40 mg, then his PCP looks to have [place] this at visit on 01/12/23. Per MyChart message on 01/03/23, he was to switch back to the 20 mg dose but this is not on his med list. Pt does need a refill to [facility] Please advise.

## 2023-01-20 ENCOUNTER — Encounter: Payer: Self-pay | Admitting: Internal Medicine

## 2023-01-20 ENCOUNTER — Ambulatory Visit: Payer: Self-pay | Admitting: Internal Medicine

## 2023-01-20 VITALS — BP 130/74 | HR 84 | Temp 98.3°F | Ht 71.0 in | Wt 276.8 lb

## 2023-01-20 DIAGNOSIS — J4520 Mild intermittent asthma, uncomplicated: Secondary | ICD-10-CM

## 2023-01-20 NOTE — Patient Instructions (Signed)
If you start needing the albuterol more than twice a week, restart the symbiciort  80  2 every 12 hours    Please schedule a follow up visit in 6 months but call sooner if needed

## 2023-01-20 NOTE — Assessment & Plan Note (Addendum)
Onset in childhood ?  / played sports fine s needing saba - 04/02/2022    symbicort 80 2bid  - 09/24/2022  After extensive coaching inhaler device,  effectiveness =   80% HFa> continue symbicort and max rx for gerd - Allergy screen 09/24/2022 >  Eos 0.1 /  IgE 25  - FENO 12/30/2022  = 13 off symbicort  > rec leave off   All goals of chronic asthma control met including optimal function and elimination of symptoms with minimal need for rescue therapy on no maint rx at all.   Contingencies discussed in full including restart symbicort 80 2bid  immediately if not controlling the symptoms using the rule of two's.   F/u q 6 m, sooner prn         Each maintenance medication was reviewed in detail including emphasizing most importantly the difference between maintenance and prns and under what circumstances the prns are to be triggered using an action plan format where appropriate.  Total time for H and P, chart review, counseling, reviewing hfa device(s) and generating customized AVS unique to this office visit / same day charting = 22 min

## 2023-01-21 ENCOUNTER — Ambulatory Visit (INDEPENDENT_AMBULATORY_CARE_PROVIDER_SITE_OTHER): Payer: No Typology Code available for payment source | Admitting: Surgery

## 2023-01-21 ENCOUNTER — Encounter: Payer: Self-pay | Admitting: Surgery

## 2023-01-21 ENCOUNTER — Encounter: Payer: Self-pay | Admitting: *Deleted

## 2023-01-21 VITALS — BP 127/78 | HR 75 | Temp 98.6°F | Resp 12 | Ht 71.0 in | Wt 278.0 lb

## 2023-01-21 DIAGNOSIS — Z09 Encounter for follow-up examination after completed treatment for conditions other than malignant neoplasm: Secondary | ICD-10-CM

## 2023-01-21 NOTE — Progress Notes (Signed)
[facility] Note   HPI:  57 y.o. Male presents to clinic for post-op follow-up s/p open umbilical hernia repair with mesh on 12/9 with subsequent hematoma evacuation in the subcutaneous tissues on 12/17.  He has been doing very well since his last visit.  He is tolerating a diet without nausea and vomiting, moving his bowels without issue.  He denies any pain at his incision site.  He did have a small amount of yellow drainage at his incision site 2 weeks ago, but this is subsequently gone away.  He does note that there is a small scab at his incision site.  He denies fevers and chills.  Review of Systems:  All other review of systems: otherwise negative   Vital Signs:  BP 127/78   Pulse 75   Temp 98.6 F (37 C) (Oral)   Resp 12   Ht 5\' 11"  (1.803 m)   Wt 278 lb (126.1 kg)   SpO2 93%   BMI 38.77 kg/m    Physical Exam:  Physical Exam Vitals reviewed.  Constitutional:      Appearance: Normal appearance.  Abdominal:     Comments: Abdomen soft, nondistended, no percussion tenderness, nontender to palpation; no rigidity, guarding, rebound tenderness; incision healing well with small central area of scabbing with fibrinous exudate (less than 0.5 cm in size)  Neurological:     Mental Status: He is alert.     Laboratory studies: None  Imaging:  None  Assessment:  57 y.o. yo Male who presents for follow-up status post open umbilical hernia repair with mesh on 12/9 and hematoma evacuation on 12/17  Plan:  -Advised the patient to continue placing antibiotic ointment at the center of his incision site, until it completely heals.  Little bit of yellow on the bandage is normal, as there is a small area that is scabbing -Advised him to call us if he begins to have purulent drainage, pain, increasing redness to the area -I also advised him that the area will continue to soften with time, as there is just some underlying scar tissue -He can return to work with no  restrictions -Follow up as needed  All of the above recommendations were discussed with the patient, and all of patient's questions were answered to his expressed satisfaction.  Graciella Freer, DO [facility] [address] [place], [place] [address] [phone] (office)

## 2023-01-27 ENCOUNTER — Encounter: Payer: Self-pay | Admitting: Pulmonary Disease

## 2023-01-27 DIAGNOSIS — G4733 Obstructive sleep apnea (adult) (pediatric): Secondary | ICD-10-CM

## 2023-01-28 NOTE — Telephone Encounter (Signed)
  Good evening doctor, I just wanted to reach out and see if the very few hours of sleep I get a night should the water tank on the machine run out of water? I usually get 6.5 hours of wear of the mask and rest. Sometimes it feels as if it's not providing enough air.

## 2023-01-30 NOTE — Telephone Encounter (Signed)
He might have a mask leak.  Can you please get a copy of his download.

## 2023-02-02 ENCOUNTER — Encounter: Payer: Self-pay | Admitting: Family Medicine

## 2023-02-02 NOTE — Telephone Encounter (Signed)
Hey Dr. Halford Chessman, here is [name]:

## 2023-02-02 NOTE — Telephone Encounter (Signed)
He is have air leak that is likely causing his water chamber to run dry during the night.  Please arrange for a mask refitting with his DME and has his Bipap setting changed to 15/11 cm H2O.

## 2023-02-03 ENCOUNTER — Emergency Department (HOSPITAL_COMMUNITY): Payer: PRIVATE HEALTH INSURANCE

## 2023-02-03 ENCOUNTER — Other Ambulatory Visit: Payer: Self-pay

## 2023-02-03 ENCOUNTER — Emergency Department (HOSPITAL_COMMUNITY)
Admission: EM | Admit: 2023-02-03 | Discharge: 2023-02-03 | Disposition: A | Discharge status: Home / Self Care | Payer: PRIVATE HEALTH INSURANCE | Attending: Emergency Medicine | Admitting: Emergency Medicine

## 2023-02-03 DIAGNOSIS — I10 Essential (primary) hypertension: Secondary | ICD-10-CM | POA: Insufficient documentation

## 2023-02-03 DIAGNOSIS — R519 Headache, unspecified: Secondary | ICD-10-CM | POA: Diagnosis not present

## 2023-02-03 DIAGNOSIS — I4891 Unspecified atrial fibrillation: Secondary | ICD-10-CM | POA: Insufficient documentation

## 2023-02-03 DIAGNOSIS — Z7901 Long term (current) use of anticoagulants: Secondary | ICD-10-CM | POA: Insufficient documentation

## 2023-02-03 DIAGNOSIS — J45909 Unspecified asthma, uncomplicated: Secondary | ICD-10-CM | POA: Diagnosis not present

## 2023-02-03 LAB — CBC
HCT: 36.5 % — ABNORMAL LOW (ref 39.0–52.0)
Hemoglobin: 11.7 g/dL — ABNORMAL LOW (ref 13.0–17.0)
MCH: 28.2 pg (ref 26.0–34.0)
MCHC: 32.1 g/dL (ref 30.0–36.0)
MCV: 88 fL (ref 80.0–100.0)
Platelets: 295 10*3/uL (ref 150–400)
RBC: 4.15 MIL/uL — ABNORMAL LOW (ref 4.22–5.81)
RDW: 13 % (ref 11.5–15.5)
WBC: 9.4 10*3/uL (ref 4.0–10.5)
nRBC: 0 % (ref 0.0–0.2)

## 2023-02-03 LAB — BASIC METABOLIC PANEL
Anion gap: 9 (ref 5–15)
BUN: 13 mg/dL (ref 6–20)
CO2: 27 mmol/L (ref 22–32)
Calcium: 9 mg/dL (ref 8.9–10.3)
Chloride: 105 mmol/L (ref 98–111)
Creatinine, Ser: 0.97 mg/dL (ref 0.61–1.24)
GFR, Estimated: >60 mL/min (ref >60)
Glucose, Bld: 99 mg/dL (ref 70–99)
Potassium: 3.3 mmol/L — ABNORMAL LOW (ref 3.5–5.1)
Sodium: 141 mmol/L (ref 135–145)

## 2023-02-03 NOTE — ED Provider Notes (Signed)
[facility] Provider Note   CSN: [id number] Arrival date & time: 02/03/23  1235     History  Chief Complaint  Patient presents with   Headache    Kyle Burns is a 57 y.o. male with history including HTN, sleep apnea, hyperlipidemia, GERD, asthma, a fib on eliquis since dvt right arm presenting with a 3 day history of intermittent generalized headache, currently described as mild in association with nose bleeds, last occurring yesterday evening.  He has been having sinus and nasal congestion,  no fevers,  secretions have been clear,  has been taking mucinex max sinus and flonase with equivocal results.  He was checked out by emt at local fire department and there was concern for non reactive pupils, therefore was sent here to be ruled out for cva.  He has had elevated bp doc 152/96 when seen by emts yesterday.  He denies visual disturbance,  photophobia, fevers, neck pain or stiffness.  Denies focal weakness, describes chronic tingling right forearm since dvt episode.   The history is provided by the patient.       Home Medications Prior to Admission medications   Medication Sig Start Date End Date Taking? Authorizing Provider  acetaminophen (TYLENOL) 325 MG tablet Take 2 tablets (650 mg total) by mouth every 6 (six) hours as needed for mild pain, fever or headache. 12/07/22   Saverio Danker, PA-C  albuterol (VENTOLIN HFA) 108 (90 Base) MCG/ACT inhaler USE 2 PUFFS EVERY 6 HOURS AS NEEDED FOR WHEEZING (1 for home, 1 for work) Patient taking differently: Inhale 2 puffs into the lungs every 6 (six) hours as needed for wheezing. 09/17/22   Ivy Lynn, NP  apixaban (ELIQUIS) 5 MG TABS tablet Take 1 tablet (5 mg total) by mouth 2 (two) times daily. Bringing in 30 day voucher 01/12/23 02/11/23  Janora Norlander, DO  apixaban (ELIQUIS) 5 MG TABS tablet Take 1 tablet (5 mg total) by mouth 2 (two) times daily. 01/12/23   Janora Norlander, DO   atorvastatin (LIPITOR) 20 MG tablet Take 1 tablet (20 mg total) by mouth daily. Change in dose Patient taking differently: Take 40 mg by mouth daily. Change in dose 01/13/23   Ronnie Doss M, DO  budesonide-formoterol (SYMBICORT) 80-4.5 MCG/ACT inhaler Inhale 2 puffs into the lungs 2 (two) times daily. Patient taking differently: Inhale 2 puffs into the lungs daily as needed (wheezing). 11/04/21   Janora Norlander, DO  docusate sodium (COLACE) 100 MG capsule Take 100 mg by mouth daily.    [provider]  esomeprazole (NEXIUM) 40 MG capsule Take 1 capsule (40 mg total) by mouth in the morning. 01/12/23   Janora Norlander, DO  famotidine (PEPCID) 20 MG tablet One after supper Patient taking differently: Take 20 mg by mouth every evening. After supper 09/24/22   Tanda Rockers, MD  FLUoxetine (PROZAC) 20 MG capsule Take 1 capsule (20 mg total) by mouth daily. 01/12/23   Janora Norlander, DO  fluticasone (FLONASE) 50 MCG/ACT nasal spray Place 2 sprays into both nostrils daily. Patient taking differently: Place 2 sprays into both nostrils in the morning. 11/04/21   Janora Norlander, DO  furosemide (LASIX) 20 MG tablet Take 1 tablet (20 mg total) by mouth daily. 01/19/23 01/19/24  Chevis Pretty, FNP  levocetirizine (XYZAL) 5 MG tablet Take 1 tablet (5 mg total) by mouth every evening. 01/12/23   Ronnie Doss M, DO  montelukast (SINGULAIR) 10  MG tablet Take 1 tablet (10 mg total) by mouth at bedtime. For allergies 01/12/23   Janora Norlander, DO  Multiple Vitamins-Minerals (ONE-A-DAY MENS 50+) TABS Take 1 tablet by mouth in the morning.    [provider]  neomycin-bacitracin-polymyxin (NEOSPORIN) [phone] ointment Apply topically daily. Apply to your incision site and belly button daily after bathing 12/10/22   Pappayliou, [name] A, DO  polyethylene glycol (MIRALAX / GLYCOLAX) 17 g packet Take 17 g by mouth daily as needed. 12/07/22   Saverio Danker, PA-C   spironolactone (ALDACTONE) 25 MG tablet Take 1 tablet (25 mg total) by mouth daily. 12/30/22 12/25/23  Theora Gianotti, NP      Allergies    Patient has no known allergies.    Review of Systems   Review of Systems  Constitutional:  Negative for fever.  HENT:  Negative for congestion and sore throat.   Eyes: Negative.  Negative for visual disturbance.  Respiratory:  Negative for chest tightness and shortness of breath.   Cardiovascular:  Negative for chest pain.  Gastrointestinal:  Negative for abdominal pain and nausea.  Genitourinary: Negative.   Musculoskeletal:  Negative for arthralgias, joint swelling and neck pain.  Skin: Negative.  Negative for rash and wound.  Neurological:  Positive for headaches. Negative for dizziness, weakness, light-headedness and numbness.  Psychiatric/Behavioral: Negative.      Physical Exam Updated Vital Signs BP 126/86   Pulse 72   Temp 98.7 F (37.1 C) (Oral)   Resp (!) 26   Ht '5\' 11"'$  (1.803 m)   Wt 123.4 kg   SpO2 95%   BMI 37.94 kg/m  Physical Exam Vitals and nursing note reviewed.  Constitutional:      Appearance: He is well-developed.  HENT:     Head: Normocephalic and atraumatic.     Right Ear: Tympanic membrane normal.     Left Ear: Tympanic membrane normal.     Nose: Congestion present.     Comments: No sinus ttp,  no active nose bleed or nidus or recent bleeding found.  Hyperemic septum.  Eyes:     Extraocular Movements: Extraocular movements intact.     Conjunctiva/sclera: Conjunctivae normal.     Pupils: Pupils are equal, round, and reactive to light.  Cardiovascular:     Rate and Rhythm: Normal rate and regular rhythm.     Heart sounds: Normal heart sounds.  Pulmonary:     Effort: Pulmonary effort is normal.     Breath sounds: Normal breath sounds. No wheezing.  Abdominal:     General: Bowel sounds are normal.     Palpations: Abdomen is soft.     Tenderness: There is no abdominal tenderness.   Musculoskeletal:        General: Normal range of motion.     Cervical back: Normal range of motion and neck supple.  Lymphadenopathy:     Cervical: No cervical adenopathy.  Skin:    General: Skin is warm and dry.     Findings: No rash.  Neurological:     General: No focal deficit present.     Mental Status: He is alert and oriented to person, place, and time.     GCS: GCS eye subscore is 4. GCS verbal subscore is 5. GCS motor subscore is 6.     Cranial Nerves: No cranial nerve deficit.     Sensory: No sensory deficit.     Coordination: Coordination normal.     Gait: Gait normal.  Deep Tendon Reflexes: Reflexes normal.     Comments: Normal heel-shin, normal rapid alternating movements. Cranial nerves III-XII intact.  No pronator drift.  Psychiatric:        Speech: Speech normal.        Behavior: Behavior normal.        Thought Content: Thought content normal.     ED Results / Procedures / Treatments   Labs (all labs ordered are listed, but only abnormal results are displayed) Labs Reviewed  CBC - Abnormal; Notable for the following components:      Result Value   RBC 4.15 (*)    Hemoglobin 11.7 (*)    HCT 36.5 (*)    All other components within normal limits  BASIC METABOLIC PANEL - Abnormal; Notable for the following components:   Potassium 3.3 (*)    All other components within normal limits    EKG EKG Interpretation  Date/Time:  Tuesday February 03 2023 16:13:12 EST Ventricular Rate:  78 PR Interval:  153 QRS Duration: 151 QT Interval:  413 QTC Calculation: 471 R Axis:   98 Text Interpretation: Sinus rhythm RBBB and LPFB Confirmed by Godfrey Pick ([id number]) on 02/03/2023 4:52:30 PM  Radiology No results found. Results for orders placed or performed during the hospital encounter of 02/03/23  CBC  Result Value Ref Range   WBC 9.4 4.0 - 10.5 K/uL   RBC 4.15 (L) 4.22 - 5.81 MIL/uL   Hemoglobin 11.7 (L) 13.0 - 17.0 g/dL   HCT 36.5 (L) 39.0 - 52.0 %   MCV 88.0  80.0 - 100.0 fL   MCH 28.2 26.0 - 34.0 pg   MCHC 32.1 30.0 - 36.0 g/dL   RDW 13.0 11.5 - 15.5 %   Platelets 295 150 - 400 K/uL   nRBC 0.0 0.0 - 0.2 %  Basic metabolic panel  Result Value Ref Range   Sodium 141 135 - 145 mmol/L   Potassium 3.3 (L) 3.5 - 5.1 mmol/L   Chloride 105 98 - 111 mmol/L   CO2 27 22 - 32 mmol/L   Glucose, Bld 99 70 - 99 mg/dL   BUN 13 6 - 20 mg/dL   Creatinine, Ser 0.97 0.61 - 1.24 mg/dL   Calcium 9.0 8.9 - 10.3 mg/dL   GFR, Estimated >60 >60 mL/min   Anion gap 9 5 - 15   CT Head Wo Contrast  Result Date: 02/03/2023 CLINICAL DATA:  Epistaxis yesterday, intermittent headaches for 3 days EXAM: CT HEAD WITHOUT CONTRAST TECHNIQUE: Contiguous axial images were obtained from the base of the skull through the vertex without intravenous contrast. RADIATION DOSE REDUCTION: This exam was performed according to the departmental dose-optimization program which includes automated exposure control, adjustment of the mA and/or kV according to patient size and/or use of iterative reconstruction technique. COMPARISON:  None Available. FINDINGS: Brain: No acute infarct or hemorrhage. Lateral ventricles and midline structures are unremarkable. No acute extra-axial fluid collections. No mass effect. Vascular: No hyperdense vessel or unexpected calcification. Skull: Normal. Negative for fracture or focal lesion. Sinuses/Orbits: Polypoid mucosal thickening within the bilateral maxillary sinuses. No gas fluid levels. Remaining paranasal sinuses are clear. Other: None. IMPRESSION: 1. No acute intracranial process. 2. Bilateral maxillary sinus mucosal thickening. No acute sinusitis. Electronically Signed   By: Randa Ngo M.D.   On: 02/03/2023 16:07    Procedures Procedures    Medications Ordered in ED Medications - No data to display  ED Course/ Medical Decision Making/ A&P  Medical Decision Making Patient with hypertension, A-fib and history of DVT on  Eliquis presenting with generalized headache, evaluated at a local fire department and was felt he had sluggish pupillary reaction, therefore was advised to come here for evaluation to rule out CVA.  Differential diagnosis certainly would include CVA, migraine headache, subdural or subarachnoid, he does report having some problems with congestion, could also be associated with sinusitis.  Meningitis, although he has been afebrile, no photophobia and no neck pain or stiffness.  His neuroexam is reassuring today.  With no deficits, his pupils are equally reactive.  Amount and/or Complexity of Data Reviewed Labs: ordered.    Details: Labs are reassuring, he does have a mild hypokalemia at 3.3. Radiology: ordered.    Details: CT head reviewed and agree with interpretation, there are no acute intracranial findings, however he does have mucosal thickening within the bilateral maxillary sinuses suggesting a chronic sinusitis.  Risk OTC drugs. Risk Details: Discussed OTC medications for relieving sinus symptoms, avoiding Sudafed/pseudoephedrine as these medications will increase blood pressure.           Final Clinical Impression(s) / ED Diagnoses Final diagnoses:  Sinus headache    Rx / DC Orders ED Discharge Orders     None         Landis Martins 02/06/23 0041    Godfrey Pick, MD 02/09/23 [id number]

## 2023-02-03 NOTE — Discharge Instructions (Signed)
Your CT scan is normal today (no stroke) but there is a suggestion of swelling of the lining of your maxillary sinus's (cheeks) but no infection.  However,  this is the source of the congestion and drainage.  I recommend stopping the Mucinex max as this will elevate blood pressure.  Coricidin brand products (look for the heart on the box) is safe for not affecting your blood pressure.  You may also continue using your flonase.  Avoid vigorous nose blowing as your nose bleed continues to heal.

## 2023-02-03 NOTE — ED Triage Notes (Signed)
Pt reports nose bleed last night, c/o intermittent 3/10 headache x 3 days and HTN 152/96. Works at [employer], and Art therapist checked his pupils and was told "non reactive". Pt called PCP and was sent here to rule out stroke. Pt denies any weakness, ambulatory in triage.

## 2023-02-04 ENCOUNTER — Telehealth: Payer: Self-pay

## 2023-02-04 NOTE — Transitions of Care (Post Inpatient/ED Visit) (Signed)
   02/04/2023  Name: KALINO BOHNER MRN: [id number] DOB: 01/30/1966  Today's TOC FU Call Status: Today's TOC FU Call Status:: Successful TOC FU Call Competed TOC FU Call Complete Date: 02/04/23  Transition Care Management Follow-up Telephone Call Date of Discharge: 02/03/23 Discharge Facility: [facility] Type of Discharge: Emergency Department Reason for ED Visit:  (sinus headache) How have you been since you were released from the hospital?: Better Any questions or concerns?: No  Items Reviewed: Did you receive and understand the discharge instructions provided?: Yes Medications obtained and verified?: Yes (Medications Reviewed) Any new allergies since your discharge?: No Dietary orders reviewed?: NA  Home Care and Equipment/Supplies: [place] Ordered?: NA Any new equipment or medical supplies ordered?: NA  Functional Questionnaire: Do you need assistance with bathing/showering or dressing?: No Do you need assistance with meal preparation?: No Do you need assistance with eating?: No Do you have difficulty maintaining continence: No Do you need assistance with getting out of bed/getting out of a chair/moving?: No Do you have difficulty managing or taking your medications?: No  Folllow up appointments reviewed: PCP Follow-up appointment confirmed?: No (no avail appt times, sent message to staff to schedule) Specialist Hospital Follow-up appointment confirmed?: NA Do you need transportation to your follow-up appointment?: No Do you understand care options if your condition(s) worsen?: Yes-patient verbalized understanding    [place], [place] Direct Dial [phone]

## 2023-02-09 ENCOUNTER — Encounter: Payer: Self-pay | Admitting: Family Medicine

## 2023-02-09 ENCOUNTER — Ambulatory Visit: Payer: No Typology Code available for payment source | Admitting: Family Medicine

## 2023-02-09 VITALS — BP 132/86 | HR 83 | Temp 98.9°F | Ht 71.0 in | Wt 283.0 lb

## 2023-02-09 DIAGNOSIS — J0110 Acute frontal sinusitis, unspecified: Secondary | ICD-10-CM | POA: Diagnosis not present

## 2023-02-09 MED ORDER — AMOXICILLIN-POT CLAVULANATE 875-125 MG PO TABS: 1.0000 | ORAL_TABLET | Freq: Two times a day (BID) | ORAL | 0 refills | Status: DC

## 2023-02-09 NOTE — Progress Notes (Signed)
Subjective: CC:ER follow up PCP: Janora Norlander, DO [id number] Kyle Burns is a 57 y.o. male presenting to clinic today for:  1.  Headache Patient was seen in the ER for elevation in blood pressure, nosebleed and headache.  When he was evaluated by a few folks at the fire department they noticed some asymmetry in his pupillary response so he was sent to the ER.  At their evaluation there was no neurologic abnormalities.  They felt his headache to be sinus related.  He presents today for ER follow-up.  He continues to have sinus headache and pressure.  He utilizes Singulair, Flonase and Xyzal.   ROS: Per HPI  No Known Allergies Past Medical History:  Diagnosis Date   Arm DVT (deep venous thromboembolism), acute, right ([place])    a. 12/2022 U/S R subclavian and axillary DVT (had PICC line during hosp in 11/2022).  Eliquis initiated.   Back pain    Diastolic dysfunction    a. 04/2018 Echo: EF 60-65%; b. 11/2022 Echo: EF 70-75%, no rwma, Gr I DD, nl RV fxn, triv MR.   GERD (gastroesophageal reflux disease)    History of asthma    HTN (hypertension)    Hyperlipidemia    Incarcerated umbilical hernia    a. [id number] s/p repair. Hosp course complicated by post operative ileus, abdominal wall hematoma requiring evacuation, hypoxic respiratory failure requiring intubation, sepsis, and A-fib with RVR.   Morbid obesity (HCC)    OSA (obstructive sleep apnea)    a. On BiPAP QHS.   PAF (paroxysmal atrial fibrillation) ([place])    a. 11/2022 - developed in setting of SBO/VDRF->briefly required amio. CHA2DS2VASc = 1-->No OAC for Afib.    Current Outpatient Medications:    acetaminophen (TYLENOL) 325 MG tablet, Take 2 tablets (650 mg total) by mouth every 6 (six) hours as needed for mild pain, fever or headache., Disp: , Rfl:    albuterol (VENTOLIN HFA) 108 (90 Base) MCG/ACT inhaler, USE 2 PUFFS EVERY 6 HOURS AS NEEDED FOR WHEEZING (1 for home, 1 for work) (Patient taking differently: Inhale 2 puffs  into the lungs every 6 (six) hours as needed for wheezing.), Disp: 36 g, Rfl: 4   apixaban (ELIQUIS) 5 MG TABS tablet, Take 1 tablet (5 mg total) by mouth 2 (two) times daily. Bringing in 30 day voucher, Disp: 60 tablet, Rfl: 0   apixaban (ELIQUIS) 5 MG TABS tablet, Take 1 tablet (5 mg total) by mouth 2 (two) times daily., Disp: 180 tablet, Rfl: 3   atorvastatin (LIPITOR) 20 MG tablet, Take 1 tablet (20 mg total) by mouth daily. Change in dose (Patient taking differently: Take 40 mg by mouth daily. Change in dose), Disp: 90 tablet, Rfl: 3   budesonide-formoterol (SYMBICORT) 80-4.5 MCG/ACT inhaler, Inhale 2 puffs into the lungs 2 (two) times daily. (Patient taking differently: Inhale 2 puffs into the lungs daily as needed (wheezing).), Disp: 30.6 g, Rfl: 4   docusate sodium (COLACE) 100 MG capsule, Take 100 mg by mouth daily., Disp: , Rfl:    esomeprazole (NEXIUM) 40 MG capsule, Take 1 capsule (40 mg total) by mouth in the morning., Disp: 90 capsule, Rfl: 3   famotidine (PEPCID) 20 MG tablet, One after supper (Patient taking differently: Take 20 mg by mouth every evening. After supper), Disp: 30 tablet, Rfl: 11   FLUoxetine (PROZAC) 20 MG capsule, Take 1 capsule (20 mg total) by mouth daily., Disp: 90 capsule, Rfl: 3   fluticasone (FLONASE) 50 MCG/ACT nasal spray, Place  2 sprays into both nostrils daily. (Patient taking differently: Place 2 sprays into both nostrils in the morning.), Disp: 16 g, Rfl: 6   furosemide (LASIX) 20 MG tablet, Take 1 tablet (20 mg total) by mouth daily., Disp: 30 tablet, Rfl: 0   levocetirizine (XYZAL) 5 MG tablet, Take 1 tablet (5 mg total) by mouth every evening., Disp: 90 tablet, Rfl: 3   montelukast (SINGULAIR) 10 MG tablet, Take 1 tablet (10 mg total) by mouth at bedtime. For allergies, Disp: 90 tablet, Rfl: 3   Multiple Vitamins-Minerals (ONE-A-DAY MENS 50+) TABS, Take 1 tablet by mouth in the morning., Disp: , Rfl:    neomycin-bacitracin-polymyxin (NEOSPORIN) [id number]  ointment, Apply topically daily. Apply to your incision site and belly button daily after bathing, Disp: 28.3 g, Rfl: 0   polyethylene glycol (MIRALAX / GLYCOLAX) 17 g packet, Take 17 g by mouth daily as needed., Disp: 14 each, Rfl: 0   spironolactone (ALDACTONE) 25 MG tablet, Take 1 tablet (25 mg total) by mouth daily., Disp: 90 tablet, Rfl: 3 Social History   Socioeconomic History   Marital status: Single    Spouse name: Not on file   Number of children: 0   Years of education: Not on file   Highest education level: Not on file  Occupational History   Not on file  Tobacco Use   Smoking status: Never   Smokeless tobacco: Former    Types: Chew    Quit date: 10/2019   Tobacco comments:    Uses chew "when he has a bad day"  Vaping Use   Vaping Use: Never used  Substance and Sexual Activity   Alcohol use: No    Comment: Socially   Drug use: No   Sexual activity: Not on file  Other Topics Concern   Not on file  Social History Narrative   Police officer   Currently taking care of his 60 year old nephew after the passing of his sister.   Social Determinants of Health   Financial Resource Strain: Not on file  Food Insecurity: No Food Insecurity (11/22/2022)   Hunger Vital Sign    Worried About Running Out of Food in the Last Year: Never true    Ran Out of Food in the Last Year: Never true  Transportation Needs: No Transportation Needs (11/22/2022)   PRAPARE - Hydrologist (Medical): No    Lack of Transportation (Non-Medical): No  Physical Activity: Not on file  Stress: Not on file  Social Connections: Not on file  Intimate Partner Violence: Not At Risk (11/22/2022)   Humiliation, Afraid, Rape, and Kick questionnaire    Fear of Current or Ex-Partner: No    Emotionally Abused: No    Physically Abused: No    Sexually Abused: No   Family History  Problem Relation Age of Onset   Asthma Father    Cancer Father        stomach   Arthritis Father     Asthma Maternal Grandfather    Diabetes Maternal Grandfather    Stroke Maternal Grandfather    Asthma Paternal Grandfather    Crohn's disease Mother    Arthritis Mother        back pain    Diabetes Mother    Hypertension Sister    Multiple sclerosis Brother    Hypertension Maternal Grandmother     Objective: Office vital signs reviewed. BP 132/86   Pulse 83   Temp 98.9 F (37.2 C)  Ht '5\' 11"'$  (1.803 m)   Wt 283 lb (128.4 kg)   SpO2 93%   BMI 39.47 kg/m   Physical Examination:  General: Awake, alert, well nourished, No acute distress HEENT: Normal    Eyes: PERRLA, extraocular membranes intact, sclera white    Nose: nasal turbinates moist, clear nasal discharge.  Hemostatic bleed noted along the nasal septum in the left nare    Throat: moist mucus membranes  Neuro: No focal neurologic deficits.  He is alert and oriented  Assessment/ Plan: 57 y.o. male   Subacute frontal sinusitis - Plan: amoxicillin-clavulanate (AUGMENTIN) 875-125 MG tablet  Discussed how to appropriately use corticosteroid nasal spray today.  Augmentin given.  Recommended sterile saline sinus rinses.  If ongoing, we can consider referral to ENT.  No orders of the defined types were placed in this encounter.  No orders of the defined types were placed in this encounter.    Janora Norlander, DO [place] [phone]

## 2023-02-09 NOTE — Patient Instructions (Signed)
[place]. ALWAYS USE DISTILLED/ STERILE water when doing a sinus rinse. Change direction of that nasal spray

## 2023-02-12 ENCOUNTER — Encounter: Payer: Self-pay | Admitting: Radiology

## 2023-02-16 ENCOUNTER — Encounter (INDEPENDENT_AMBULATORY_CARE_PROVIDER_SITE_OTHER): Payer: No Typology Code available for payment source | Admitting: Family Medicine

## 2023-02-16 DIAGNOSIS — A084 Viral intestinal infection, unspecified: Secondary | ICD-10-CM | POA: Diagnosis not present

## 2023-02-17 ENCOUNTER — Encounter: Payer: Self-pay | Admitting: Family Medicine

## 2023-02-17 MED ORDER — ONDANSETRON 4 MG PO TBDP: 4.0000 mg | ORAL_TABLET | Freq: Three times a day (TID) | ORAL | 0 refills | Status: DC | PRN

## 2023-02-17 NOTE — Telephone Encounter (Signed)

## 2023-02-26 NOTE — Progress Notes (Signed)
Cardiology Office Note  Date: 02/27/2023   ID: ANTWIONE OWUSU, DOB 1966-03-24, MRN [id number]  History of Present Illness: Kyle Burns is a 57 y.o. male last seen in January by Mr. Sharolyn Douglas NP, I reviewed the note.  He is here for a follow-up visit.  I saw him during hospital consultation and diagnosis of atrial fibrillation.  He states that he is back to baseline, working full-time with the police department.  He does not report any exertional chest pain, feels an occasional sense of palpitations but nothing progressive.  No dizziness or syncope.  I reviewed his medications.  He has continued on Eliquis which was initiated in the setting of right upper extremity DVT following PICC line in December 2023.  Although CHA2DS2-VASc score is 1, likelihood of recurring atrial fibrillation is quite high I think it makes most sense to continue longer-term anticoagulation predominantly for stroke risk with atrial fibrillation.  We discussed this today and he is in agreement.  He continues to follow with PCP, I reviewed his lab work from February.  Physical Exam: VS:  BP (!) 140/88   Pulse 76   Ht 5\' 11"  (1.803 m)   Wt 270 lb (122.5 kg)   SpO2 92%   BMI 37.66 kg/m , BMI Body mass index is 37.66 kg/m.  Wt Readings from Last 3 Encounters:  02/27/23 270 lb (122.5 kg)  02/09/23 283 lb (128.4 kg)  02/03/23 272 lb (123.4 kg)    General: Patient appears comfortable at rest. HEENT: Conjunctiva and lids normal. Neck: Supple, no elevated JVP or carotid bruits. Lungs: Clear to auscultation, nonlabored breathing at rest. Cardiac: Regular rate and rhythm, no S3 or significant systolic murmur. Extremities: No pitting edema.  ECG:  An ECG dated 02/03/2023 was personally reviewed today and demonstrated:  Sinus rhythm with right bundle branch block and left posterior fascicular block.  Labwork: 12/05/2022: Magnesium 2.1 12/14/2022: B Natriuretic Peptide 68.0 01/12/2023: ALT 22; AST 21 02/03/2023:  BUN 13; Creatinine, Ser 0.97; Hemoglobin 11.7; Platelets 295; Potassium 3.3; Sodium 141     Component Value Date/Time   CHOL 157 01/12/2023 0912   TRIG 99 01/12/2023 0912   HDL 42 01/12/2023 0912   CHOLHDL 3.7 01/12/2023 0912   LDLCALC 97 01/12/2023 0912   Other Studies Reviewed Today:  Echocardiogram 11/25/2022:  1. Left ventricular ejection fraction, by estimation, is 70 to 75%. The  left ventricle has hyperdynamic function. The left ventricle has no  regional wall motion abnormalities. There is mild left ventricular  hypertrophy. Left ventricular diastolic  parameters are consistent with Grade I diastolic dysfunction (impaired  relaxation).   2. Right ventricular systolic function is normal. The right ventricular  size is normal. Tricuspid regurgitation signal is inadequate for assessing  PA pressure.   3. The mitral valve is grossly normal, mildly calcified. Trivial mitral  valve regurgitation.   4. The aortic valve is tricuspid. Aortic valve regurgitation is not  visualized. Aortic valve mean gradient measures 4.0 mmHg.   5. Unable to estimate CVP.   Assessment and Plan:  1.  Paroxysmal atrial fibrillation with CHA2DS2-VASc score of 1, diagnosis made in December 2023 in the setting of comorbid illness and hospital stay.  He has a history of intermittent palpitations at baseline suggesting recurring arrhythmia over time, and his likelihood of progressive arrhythmia is fairly high in the setting of OSA and diastolic dysfunction.  We discussed continuing Eliquis even after treatment course for right upper extremity DVT, considering longer-term use  for stroke prophylaxis as long as he tolerates it.  Follow-up CBC and BMET for next visit.  2.  HFpEF, LVEF 70 to 75% with mild diastolic dysfunction and normal RV contraction by echocardiogram in December 2023.  Symptomatically stable at this time with NYHA class II dyspnea.  His weight is down 13 pounds.  Currently on Aldactone and Lasix,  no SGLT2 inhibitor at this point.  Continue to follow.  3.  Right upper extremity DVT following PICC line diagnosed in December 2023.  Eliquis initiated for 60-month treatment course.  Plan to continue anticoagulation as discussed above in problem #1.  4.  Essential hypertension.  No changes to current regimen.  5.  Mixed hyperlipidemia.  He is on Lipitor at present with follow-up by PCP.  Last LDL was 97.  6.  OSA on BiPAP.  He reports compliance with treatment.  Disposition:  Follow up  6 months.  Signed, Satira Sark, M.D., F.A.C.C.

## 2023-02-27 ENCOUNTER — Ambulatory Visit: Payer: No Typology Code available for payment source | Attending: Cardiology | Admitting: Cardiology

## 2023-02-27 ENCOUNTER — Encounter: Payer: Self-pay | Admitting: Cardiology

## 2023-02-27 VITALS — BP 140/88 | HR 76 | Ht 71.0 in | Wt 270.0 lb

## 2023-02-27 DIAGNOSIS — I480 Paroxysmal atrial fibrillation: Secondary | ICD-10-CM

## 2023-02-27 DIAGNOSIS — E782 Mixed hyperlipidemia: Secondary | ICD-10-CM

## 2023-02-27 DIAGNOSIS — I5032 Chronic diastolic (congestive) heart failure: Secondary | ICD-10-CM | POA: Diagnosis not present

## 2023-02-27 NOTE — Patient Instructions (Signed)
.  Medication Instructions:  Your physician recommends that you continue on your current medications as directed. Please refer to the Current Medication list given to you today.   Labwork: CBC,BMET in 6 months  Testing/Procedures: None today  Follow-Up: 6 months  Any Other Special Instructions Will Be Listed Below (If Applicable).  If you need a refill on your cardiac medications before your next appointment, please call your pharmacy. Your physician recommends that you continue on your current medications as directed. Please refer to the Current Medication list given to you today.

## 2023-03-23 ENCOUNTER — Other Ambulatory Visit: Payer: Self-pay | Admitting: Nurse Practitioner

## 2023-04-13 ENCOUNTER — Encounter: Payer: Self-pay | Admitting: Family Medicine

## 2023-05-02 ENCOUNTER — Other Ambulatory Visit: Payer: Self-pay | Admitting: Family Medicine

## 2023-05-11 ENCOUNTER — Encounter: Payer: Self-pay | Admitting: Family Medicine

## 2023-05-11 DIAGNOSIS — I480 Paroxysmal atrial fibrillation: Secondary | ICD-10-CM

## 2023-05-11 DIAGNOSIS — I82621 Acute embolism and thrombosis of deep veins of right upper extremity: Secondary | ICD-10-CM

## 2023-05-12 MED ORDER — APIXABAN 5 MG PO TABS: 5.0000 mg | ORAL_TABLET | Freq: Two times a day (BID) | ORAL | 3 refills | Status: DC

## 2023-05-23 DIAGNOSIS — G4733 Obstructive sleep apnea (adult) (pediatric): Secondary | ICD-10-CM | POA: Diagnosis not present

## 2023-06-10 ENCOUNTER — Encounter: Payer: Self-pay | Admitting: Cardiology

## 2023-06-10 ENCOUNTER — Encounter: Payer: Self-pay | Admitting: Family Medicine

## 2023-06-22 DIAGNOSIS — G4733 Obstructive sleep apnea (adult) (pediatric): Secondary | ICD-10-CM | POA: Diagnosis not present

## 2023-07-06 ENCOUNTER — Other Ambulatory Visit: Payer: Self-pay | Admitting: Family Medicine

## 2023-07-06 ENCOUNTER — Other Ambulatory Visit: Payer: Self-pay | Admitting: Nurse Practitioner

## 2023-07-08 NOTE — Progress Notes (Signed)
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 Asthma as child no problem as adolescent  self referred to pulmonary clinic in [place]  04/02/2022  with documented L HD immobility and MO by BMI c/b OSA on cpap     History of Present Illness  04/02/2022  Pulmonary/ 1st office eval/ Selby Foisy / [place] [facility] was maintained on symbicort previously but off x over  a month s change in symptoms  Chief Complaint  Patient presents with   New Patient (Initial Visit)    Has seen [name].  Feels asthma is going well.   Dyspnea:  not sure symbicort really helps  Cough: none  SABA use: last used a month prior to OV   Rec Symbicort 80 can be used up to 2 puffs  first thing in am and 2 puff every every 12 hours and then wean off after a week Only use your albuterol as a rescue medication Ok to try albuterol 15 min before an activity (on alternating days)  that you know would usually make you short of breath  Work on inhaler technique  05/22/22  [facility] eval for osa > rx Bipap /02  09/24/2022  f/u ov/[place] office/Breaker Springer re: asthma maint on symbicort 80 2bid   Chief Complaint  Patient presents with   Follow-up    Cough is still bothering patient. Dry cough most of the time but sometimes productive   Dyspnea:  ok flat surface / problems with hills  = MMRC1 = can walk nl pace, flat grade, can't hurry or go uphills or steps s sob   Cough: worse with speaking or deep breath x one month   Sleeping: on Bipap  / 02  x 4h  feels rested but not getting enough pressure  Supine/ 4 pillows  has gained 10 lbs since starting  SABA use: qod  02: none daytime/  2lpm hs  Covid status: all but new one  Rec Dr Craige Cotta next available to troubleshoot your bipap problems Continue nexium but take it 30-60 before 1st meal and add  after supper  Pepcid 20 mg  GERD diet Work on inhaler technique:   Try delsym 2 tsp twice daily  as needed for cough  Allergy screen 09/24/2022 >  Eos 0.1 /  IgE 25       11/14/2022  f/u ov/Fortunata Betty re: doe/cough maint   off symbicort x one week and cough maybe some better / convinced this  is pnds triggered  Chief Complaint  Patient presents with   Follow-up    Cough persistent  Dyspnea:  no change ex tol on vs off symbicort Cough: mostly dry/ sense of pnds worse before supper and at bedime  Sleeping: not disturbing sleep / bed= flat waterbed / 2 pillows  SABA use: none  02: for bipap   Covid status:   vax x last one,? If ever infected Rec Depomedrol 120 mg IM  Stay on nexium and pepcid as you are  GERD diet reviewed, bed blocks rec  For drainage / throat tickle try take CHLORPHENIRAMINE  4 mg  Stop symicort and just use albuterol as needed for now Please schedule a follow up office visit in 6 weeks, call sooner if needed with all medications /inhalers/ solutions in hand    Late add: if not improving > tessalon 200      01/20/2023  f/u ov/T[facility]ria Pfannenstiel re: cough x 08/2022    maint on singulair/ nasal sprays and gerd rx only / no symbicort  or saba since last ov  Chief Complaint  Patient presents with   Follow-up    Cough improved.  Some PND  Dyspnea:  no problem chasing 37 y old  Cough: some pnds / no more purulent  Sleeping: on bipap per Sood  SABA use: none  02: 3lpm bipap  Covid status:   vax all but last  Rec If you start needing the albuterol more than twice a week, restart the symbiciort  80  2 every 12 hours    07/09/2023  f/u ov/[place] office/Keymari Sato re: cough since 08/2022  maint on singulair and  prn albuterol  Chief Complaint  Patient presents with   Asthma  Dyspnea:  chasing 2 y around house  Cough: none  Sleeping: on cpap / 02  SABA use: not since last ov  02: just hs x 3lpm    No obvious day to day or daytime variability or assoc excess/ purulent sputum or mucus plugs or hemoptysis or cp or chest tightness, subjective wheeze or overt sinus or hb symptoms.    Sleeping as above  without nocturnal  or early am exacerbation  of respiratory  c/o's or need for noct saba. Also denies any obvious fluctuation of symptoms with weather or environmental changes or other aggravating or alleviating factors except as outlined above   No unusual exposure hx or h/o childhood pna  or knowledge of premature birth.  Current Allergies, Complete Past Medical History, Past Surgical History, Family History, and Social History were reviewed in [name] record.  ROS  The following are not active complaints unless bolded Hoarseness, sore throat, dysphagia, dental problems, itching, sneezing,  nasal congestion or discharge of excess mucus or purulent secretions, ear ache,   fever, chills, sweats, unintended wt loss or wt gain, classically pleuritic or exertional cp,  orthopnea pnd or arm/hand swelling  or leg swelling, presyncope, palpitations, abdominal pain, anorexia, nausea, vomiting, diarrhea  or change in bowel habits or change in bladder habits, change in stools or change in urine, dysuria, hematuria,  rash, arthralgias, visual complaints, headache, numbness, weakness or ataxia or problems with walking or coordination,  change in mood or  memory.        Current Meds  Medication Sig   acetaminophen (TYLENOL) 325 MG tablet Take 2 tablets (650 mg total) by mouth every 6 (six) hours as needed for mild pain, fever or headache.   albuterol (VENTOLIN HFA) 108 (90 Base) MCG/ACT inhaler USE 2 PUFFS EVERY 6 HOURS AS NEEDED FOR WHEEZING (1 for home, 1 for work) (Patient taking differently: Inhale 2 puffs into the lungs every 6 (six) hours as needed for wheezing.)   apixaban (ELIQUIS) 5 MG TABS tablet Take 1 tablet (5 mg total) by mouth 2 (two) times daily.   atorvastatin (LIPITOR) 20 MG tablet Take 1 tablet (20 mg total) by mouth daily. Change in dose (Patient taking differently: Take 40 mg by mouth daily. Change in dose)   budesonide-formoterol (SYMBICORT)  80-4.5 MCG/ACT inhaler Inhale 2 puffs into the lungs 2 (two) times daily. (Patient taking differently: Inhale 2 puffs into the lungs daily as needed (wheezing).)   esomeprazole (NEXIUM) 40 MG capsule Take 1 capsule (40 mg total) by mouth in the morning.   famotidine (PEPCID) 20 MG tablet One after supper (Patient taking differently: Take 20 mg by mouth every evening. After supper)  FLUoxetine (PROZAC) 20 MG capsule Take 1 capsule (20 mg total) by mouth daily.   fluticasone (FLONASE) 50 MCG/ACT nasal spray Place 2 sprays into both nostrils daily. (Patient taking differently: Place 2 sprays into both nostrils in the morning.)   furosemide (LASIX) 20 MG tablet TAKE ONE (1) TABLET BY MOUTH EVERY DAY   levocetirizine (XYZAL) 5 MG tablet Take 1 tablet (5 mg total) by mouth every evening.   montelukast (SINGULAIR) 10 MG tablet Take 1 tablet (10 mg total) by mouth at bedtime. For allergies   Multiple Vitamins-Minerals (ONE-A-DAY MENS 50+) TABS Take 1 tablet by mouth in the morning.   neomycin-bacitracin-polymyxin (NEOSPORIN) [id number] ointment Apply topically daily. Apply to your incision site and belly button daily after bathing   spironolactone (ALDACTONE) 25 MG tablet Take 1 tablet (25 mg total) by mouth daily.            Past Medical History:  Diagnosis Date   Asthma    hx of    Back pain    Bilevel positive airway pressure (BPAP) dependence    GERD (gastroesophageal reflux disease)    HTN (hypertension)    Hyperlipidemia         Objective:    Wts  07/09/2023      277  01/20/2023        276  12/30/2022      270 11/14/2022      295   09/24/22 295 lb (133.8 kg)  09/12/22 293 lb (132.9 kg)  09/01/22 300 lb (136.1 kg)     Vital signs reviewed  07/09/2023  - Note at rest 02 sats  93% on RA   General appearance:   pleasant amb bm nad  Obese bm nad   HEENT : Oropharynx  clear/ M2         NECK :  without  apparent JVD/ palpable Nodes/TM    LUNGS: no acc muscle use,  Nl contour  chest which is clear to A and P bilaterally without cough on insp or exp maneuvers   CV:  RRR  no s3 or murmur or increase in P2, and no edema   ABD: quite obese but  soft and nontender   MS:  Nl gait/ ext warm without deformities Or obvious joint restrictions  calf tenderness, cyanosis or clubbing    SKIN: warm and dry without lesions    NEURO:  alert, approp, nl sensorium with  no motor or cerebellar deficits apparent.         Assessment

## 2023-07-09 ENCOUNTER — Telehealth: Payer: Self-pay

## 2023-07-09 ENCOUNTER — Encounter: Payer: Self-pay | Admitting: Internal Medicine

## 2023-07-09 ENCOUNTER — Ambulatory Visit: Payer: 59 | Admitting: Internal Medicine

## 2023-07-09 VITALS — BP 124/71 | HR 84 | Ht 71.0 in | Wt 277.0 lb

## 2023-07-09 DIAGNOSIS — J4520 Mild intermittent asthma, uncomplicated: Secondary | ICD-10-CM | POA: Diagnosis not present

## 2023-07-09 NOTE — Patient Instructions (Signed)
Weight control is simply a matter of calorie balance which needs to be tilted in your favor by eating less and exercising more.  To get the most out of exercise, you need to be continuously aware that you are short of breath, but never out of breath, for 30 minutes daily.    No change in resp medications   Will ask my nurse to generate a phone message to Dr Craige Cotta regarding your cpap machine/mask  Please schedule a follow up visit in 12  months but call sooner if needed

## 2023-07-09 NOTE — Telephone Encounter (Signed)
Patient in office today. 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 He has not heard from anyone at [employer], has tried to call but has not been successful.  Spoke with Jordan/[employer]. She is sending message to [facility] team for follow up and requests them to contact our office with further information.

## 2023-07-10 ENCOUNTER — Encounter: Payer: Self-pay | Admitting: Internal Medicine

## 2023-07-10 NOTE — Assessment & Plan Note (Addendum)
Onset in childhood ?  / played sports fine s needing saba - 04/02/2022    symbicort 80 2bid  - 09/24/2022  After extensive coaching inhaler device,  effectiveness =   80% HFa> continue symbicort and max rx for gerd - Allergy screen 09/24/2022 >  Eos 0.1 /  IgE 25  - FENO 12/30/2022  = 13 off symbicort  > rec leave off   All goals of chronic asthma control met including optimal function and elimination of symptoms with minimal need for rescue therapy.  Contingencies discussed in full including contacting this office immediately if not controlling the symptoms using the rule of two's.     Continue  symbicort 80 prn Based on two studies from NEJM  378; 20 p 1865 (2018) and 380 : p2020-30 (2019) in pts with mild asthma it is reasonable to use low dose symbicort eg 80 2bid "prn" flare in this setting but I emphasized this was only shown with symbicort and takes advantage of the rapid onset of action but is not the same as "rescue therapy" but can be stopped once the acute symptoms have resolved and the need for rescue has been minimized (< 2 x weekly)           Each maintenance medication was reviewed in detail including emphasizing most importantly the difference between maintenance and prns and under what circumstances the prns are to be triggered using an action plan format where appropriate.  Total time for H and P, chart review, counseling, reviewing hfa device(s) and generating customized AVS unique to this office visit / same day charting = 20 min

## 2023-07-13 ENCOUNTER — Ambulatory Visit: Payer: 59 | Admitting: Family Medicine

## 2023-07-13 ENCOUNTER — Encounter: Payer: Self-pay | Admitting: Family Medicine

## 2023-07-13 VITALS — BP 115/72 | HR 85 | Temp 98.5°F | Ht 71.0 in | Wt 276.0 lb

## 2023-07-13 DIAGNOSIS — I480 Paroxysmal atrial fibrillation: Secondary | ICD-10-CM | POA: Diagnosis not present

## 2023-07-13 DIAGNOSIS — I10 Essential (primary) hypertension: Secondary | ICD-10-CM

## 2023-07-13 NOTE — Progress Notes (Signed)
Subjective: CC: Chronic follow-up PCP: Raliegh Ip, DO ZOX:[id number] Kyle Burns is a 57 y.o. male presenting to clinic today for:  1.  Atrial fibrillation Patient reports compliance with Eliquis.  He reports no heart palpitations, bleeding.  He in fact he was able to get Eliquis through patient assistance program this really has alleviated some of the financial stress he was experiencing.  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  He reports the passing of his stepfather recently.  ROS: Per HPI  No Known Allergies Past Medical History:  Diagnosis Date   Arm DVT (deep venous thromboembolism), acute, right (HCC)    a. 12/2022 U/S R subclavian and axillary DVT (had PICC line during hosp in 11/2022).  Eliquis initiated.   Back pain    Diastolic dysfunction    a. 04/2018 Echo: EF 60-65%; b. 11/2022 Echo: EF 70-75%, no rwma, Gr I DD, nl RV fxn, triv MR.   GERD (gastroesophageal reflux disease)    History of asthma    HTN (hypertension)    Hyperlipidemia    Incarcerated umbilical hernia    a. 11/2022 s/p repair. Hosp course complicated by post operative ileus, abdominal wall hematoma requiring evacuation, hypoxic respiratory failure requiring intubation, sepsis, and A-fib with RVR.   Morbid obesity (HCC)    OSA (obstructive sleep apnea)    a. On BiPAP QHS.   PAF (paroxysmal atrial fibrillation) (HCC)    a. 11/2022 - developed in setting of SBO/VDRF->briefly required amio. CHA2DS2VASc = 1-->No OAC for Afib.    Current Outpatient Medications:    acetaminophen (TYLENOL) 325 MG tablet, Take 2 tablets (650 mg total) by mouth every 6 (six) hours as needed for mild pain, fever or headache., Disp: , Rfl:    albuterol (VENTOLIN HFA) 108 (90 Base) MCG/ACT inhaler, USE 2 PUFFS EVERY 6 HOURS AS NEEDED FOR WHEEZING (1 for home, 1 for work) (Patient taking differently: Inhale 2 puffs into the lungs every 6 (six)  hours as needed for wheezing.), Disp: 36 g, Rfl: 4   apixaban (ELIQUIS) 5 MG TABS tablet, Take 1 tablet (5 mg total) by mouth 2 (two) times daily., Disp: 180 tablet, Rfl: 3   atorvastatin (LIPITOR) 20 MG tablet, Take 1 tablet (20 mg total) by mouth daily. Change in dose (Patient taking differently: Take 40 mg by mouth daily. Change in dose), Disp: 90 tablet, Rfl: 3   budesonide-formoterol (SYMBICORT) 80-4.5 MCG/ACT inhaler, Inhale 2 puffs into the lungs 2 (two) times daily. (Patient taking differently: Inhale 2 puffs into the lungs daily as needed (wheezing).), Disp: 30.6 g, Rfl: 4   esomeprazole (NEXIUM) 40 MG capsule, Take 1 capsule (40 mg total) by mouth in the morning., Disp: 90 capsule, Rfl: 3   famotidine (PEPCID) 20 MG tablet, One after supper (Patient taking differently: Take 20 mg by mouth every evening. After supper), Disp: 30 tablet, Rfl: 11   FLUoxetine (PROZAC) 20 MG capsule, Take 1 capsule (20 mg total) by mouth daily., Disp: 90 capsule, Rfl: 3   fluticasone (FLONASE) 50 MCG/ACT nasal spray, Place 2 sprays into both nostrils daily. (Patient taking differently: Place 2 sprays into both nostrils in the morning.), Disp: 16 g, Rfl: 6   furosemide (LASIX) 20 MG tablet, TAKE ONE (1) TABLET BY MOUTH EVERY DAY, Disp: 30 tablet, Rfl: 3   levocetirizine (XYZAL) 5 MG tablet, Take 1 tablet (5 mg total) by mouth every evening.,  Disp: 90 tablet, Rfl: 3   montelukast (SINGULAIR) 10 MG tablet, Take 1 tablet (10 mg total) by mouth at bedtime. For allergies, Disp: 90 tablet, Rfl: 3   Multiple Vitamins-Minerals (ONE-A-DAY MENS 50+) TABS, Take 1 tablet by mouth in the morning., Disp: , Rfl:    neomycin-bacitracin-polymyxin (NEOSPORIN) [phone] ointment, Apply topically daily. Apply to your incision site and belly button daily after bathing, Disp: 28.3 g, Rfl: 0   spironolactone (ALDACTONE) 25 MG tablet, Take 1 tablet (25 mg total) by mouth daily., Disp: 90 tablet, Rfl: 3 Social History   Socioeconomic  History   Marital status: Single    Spouse name: Not on file   Number of children: 0   Years of education: Not on file   Highest education level: Not on file  Occupational History   Not on file  Tobacco Use   Smoking status: Never   Smokeless tobacco: Former    Types: Chew    Quit date: 10/2019   Tobacco comments:    Uses chew "when he has a bad day"  Vaping Use   Vaping status: Never Used  Substance and Sexual Activity   Alcohol use: No    Comment: Socially   Drug use: No   Sexual activity: Not on file  Other Topics Concern   Not on file  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   Social Determinants of Health   Financial Resource Strain: Not on file  Food Insecurity: No Food Insecurity (11/22/2022)   Hunger Vital Sign    Worried About Running Out of Food in the Last Year: Never true    Ran Out of Food in the Last Year: Never true  Transportation Needs: No Transportation Needs (11/22/2022)   PRAPARE - Administrator, Civil Service (Medical): No    Lack of Transportation (Non-Medical): No  Physical Activity: Not on file  Stress: Not on file  Social Connections: Unknown (04/27/2022)   [facility]   Social Network    Social Network: Not on file  Intimate Partner Violence: Not At Risk (11/22/2022)   Humiliation, Afraid, Rape, and Kick questionnaire    Fear of Current or Ex-Partner: No    Emotionally Abused: No    Physically Abused: No    Sexually Abused: No   Family History  Problem Relation Age of Onset   Asthma Father    Cancer Father        stomach   Arthritis Father    Asthma Maternal Grandfather    Diabetes Maternal Grandfather    Stroke Maternal Grandfather    Asthma Paternal Grandfather    Crohn's disease Mother    Arthritis Mother        back pain    Diabetes Mother    Hypertension Sister    Multiple sclerosis Brother     Hypertension Maternal Grandmother     Objective: Office vital signs reviewed. BP 115/72   Pulse 85   Temp 98.5 F (36.9 C)   Ht 5\' 11"  (1.803 m)   Wt 276 lb (125.2 kg)   SpO2 92%   BMI 38.49 kg/m   Physical Examination:  General: Awake, alert, well nourished, No acute distress HEENT: sclera white, MMM Cardio: regular rate and rhythm, S1S2 heard, no murmurs appreciated Pulm: clear to auscultation bilaterally, no wheezes, rhonchi or rales; normal work of breathing on  room air Psych: pleasant, jovial     07/13/2023   12:45 PM 01/12/2023    9:03 AM 12/22/2022   12:29 PM  Depression screen PHQ 2/9  Decreased Interest 0 0 0  Down, Depressed, Hopeless 0 0 0  PHQ - 2 Score 0 0 0  Altered sleeping 0 0 0  Tired, decreased energy 0 0 0  Change in appetite 0 0 0  Feeling bad or failure about yourself  0 0 0  Trouble concentrating 0 0 0  Moving slowly or fidgety/restless 0 0 0  Suicidal thoughts 0 0 0  PHQ-9 Score 0 0 0  Difficult doing work/chores Not difficult at all Not difficult at all Not difficult at all      07/13/2023   12:45 PM 02/09/2023    8:26 AM 01/12/2023    9:03 AM 12/22/2022   12:29 PM  GAD 7 : Generalized Anxiety Score  Nervous, Anxious, on Edge 0 0 0 0  Control/stop worrying 0 0 0 0  Worry too much - different things 0 0 0 0  Trouble relaxing 0 0 0 0  Restless 0 0 0 0  Easily annoyed or irritable 0 0 0 0  Afraid - awful might happen 0 0 0 0  Total GAD 7 Score 0 0 0 0  Anxiety Difficulty Not difficult at all  Not difficult at all Not difficult at all    Assessment/ Plan: 57 y.o. male   Essential hypertension with goal blood pressure less than 130/80  Paroxysmal A-fib (HCC)  BP controlled. Afib seems rate and rhythm controlled today. No changes.  On PAP for Eliquis.  Plan to follow up in 46m for annual PE with fasting labs.    No orders of the defined types were placed in this encounter.  No orders of the defined types were placed in this  encounter.    Raliegh Ip, DO [facility] [phone]

## 2023-07-23 DIAGNOSIS — G4733 Obstructive sleep apnea (adult) (pediatric): Secondary | ICD-10-CM | POA: Diagnosis not present

## 2023-08-08 DIAGNOSIS — G4733 Obstructive sleep apnea (adult) (pediatric): Secondary | ICD-10-CM | POA: Diagnosis not present

## 2023-08-12 DIAGNOSIS — I4891 Unspecified atrial fibrillation: Secondary | ICD-10-CM | POA: Diagnosis not present

## 2023-08-12 DIAGNOSIS — F419 Anxiety disorder, unspecified: Secondary | ICD-10-CM | POA: Diagnosis not present

## 2023-08-12 DIAGNOSIS — D6869 Other thrombophilia: Secondary | ICD-10-CM | POA: Diagnosis not present

## 2023-08-12 DIAGNOSIS — G4733 Obstructive sleep apnea (adult) (pediatric): Secondary | ICD-10-CM | POA: Diagnosis not present

## 2023-08-12 DIAGNOSIS — I10 Essential (primary) hypertension: Secondary | ICD-10-CM | POA: Diagnosis not present

## 2023-08-12 DIAGNOSIS — G8929 Other chronic pain: Secondary | ICD-10-CM | POA: Diagnosis not present

## 2023-08-12 DIAGNOSIS — Z6838 Body mass index (BMI) 38.0-38.9, adult: Secondary | ICD-10-CM | POA: Diagnosis not present

## 2023-08-12 DIAGNOSIS — Z7951 Long term (current) use of inhaled steroids: Secondary | ICD-10-CM | POA: Diagnosis not present

## 2023-08-12 DIAGNOSIS — E785 Hyperlipidemia, unspecified: Secondary | ICD-10-CM | POA: Diagnosis not present

## 2023-08-12 DIAGNOSIS — J45909 Unspecified asthma, uncomplicated: Secondary | ICD-10-CM | POA: Diagnosis not present

## 2023-08-12 DIAGNOSIS — Z8249 Family history of ischemic heart disease and other diseases of the circulatory system: Secondary | ICD-10-CM | POA: Diagnosis not present

## 2023-08-23 DIAGNOSIS — G4733 Obstructive sleep apnea (adult) (pediatric): Secondary | ICD-10-CM | POA: Diagnosis not present

## 2023-08-31 ENCOUNTER — Encounter: Payer: Self-pay | Admitting: Cardiology

## 2023-09-01 ENCOUNTER — Other Ambulatory Visit (HOSPITAL_COMMUNITY)
Admission: RE | Admit: 2023-09-01 | Discharge: 2023-09-01 | Disposition: A | Discharge status: Home / Self Care | Payer: 59 | Source: Ambulatory Visit | Attending: Cardiology | Admitting: Cardiology

## 2023-09-01 ENCOUNTER — Encounter: Payer: Self-pay | Admitting: Family Medicine

## 2023-09-01 DIAGNOSIS — I480 Paroxysmal atrial fibrillation: Secondary | ICD-10-CM | POA: Diagnosis not present

## 2023-09-01 LAB — BASIC METABOLIC PANEL
Anion gap: 8 (ref 5–15)
BUN: 21 mg/dL — ABNORMAL HIGH (ref 6–20)
CO2: 29 mmol/L (ref 22–32)
Calcium: 9.3 mg/dL (ref 8.9–10.3)
Chloride: 103 mmol/L (ref 98–111)
Creatinine, Ser: 1.09 mg/dL (ref 0.61–1.24)
GFR, Estimated: >60 mL/min (ref >60)
Glucose, Bld: 109 mg/dL — ABNORMAL HIGH (ref 70–99)
Potassium: 3.4 mmol/L — ABNORMAL LOW (ref 3.5–5.1)
Sodium: 140 mmol/L (ref 135–145)

## 2023-09-01 LAB — CBC
HCT: 42.5 % (ref 39.0–52.0)
Hemoglobin: 13.8 g/dL (ref 13.0–17.0)
MCH: 28.9 pg (ref 26.0–34.0)
MCHC: 32.5 g/dL (ref 30.0–36.0)
MCV: 88.9 fL (ref 80.0–100.0)
Platelets: 281 10*3/uL (ref 150–400)
RBC: 4.78 MIL/uL (ref 4.22–5.81)
RDW: 12.5 % (ref 11.5–15.5)
WBC: 7.9 10*3/uL (ref 4.0–10.5)
nRBC: 0 % (ref 0.0–0.2)

## 2023-09-02 ENCOUNTER — Telehealth: Payer: Self-pay | Admitting: *Deleted

## 2023-09-02 MED ORDER — POTASSIUM CHLORIDE ER 10 MEQ PO TBCR: 10.0000 meq | EXTENDED_RELEASE_TABLET | Freq: Every day | ORAL | 1 refills | Status: DC

## 2023-09-02 NOTE — Telephone Encounter (Signed)
Patient informed and verbalized understanding of plan. Copy sent to PCP

## 2023-09-02 NOTE — Telephone Encounter (Signed)
-----   Message from Nona Dell sent at 09/01/2023 11:33 AM EDT ----- Renal function and Hgb normal. Potassium mildly low. Suggest adding KCL 10 mEq daily.

## 2023-09-04 ENCOUNTER — Encounter: Payer: Self-pay | Admitting: Cardiology

## 2023-09-04 ENCOUNTER — Ambulatory Visit: Payer: 59 | Attending: Cardiology | Admitting: Cardiology

## 2023-09-04 VITALS — BP 132/90 | HR 80 | Ht 71.0 in | Wt 276.0 lb

## 2023-09-04 DIAGNOSIS — E782 Mixed hyperlipidemia: Secondary | ICD-10-CM

## 2023-09-04 DIAGNOSIS — G4733 Obstructive sleep apnea (adult) (pediatric): Secondary | ICD-10-CM

## 2023-09-04 DIAGNOSIS — I480 Paroxysmal atrial fibrillation: Secondary | ICD-10-CM | POA: Diagnosis not present

## 2023-09-04 NOTE — Patient Instructions (Signed)
Medication Instructions:  Your physician recommends that you continue on your current medications as directed. Please refer to the Current Medication list given to you today.   Labwork: None today  Testing/Procedures: None today  Follow-Up: 6 months  Any Other Special Instructions Will Be Listed Below (If Applicable).  If you need a refill on your cardiac medications before your next appointment, please call your pharmacy.  

## 2023-09-04 NOTE — Progress Notes (Signed)
Cardiology Office Note  Date: 09/04/2023   ID: Amado, [place] 26-Apr-1966, MRN [id number]  History of Present Illness: Kyle Burns is a 57 y.o. male last seen in March.  He is here for a routine visit.  He does not report any significant palpitations in the interim, no exertional chest pain, stable NYHA class II dyspnea.  I reviewed his medications.  He does not report any spontaneous bleeding problems on Eliquis.  Also tolerating Lipitor, Lasix, potassium supplement, and Aldactone.  I reviewed his lab work done recently by PCP.  He is nearing prediabetic range but has no prior diagnosis.  Physical Exam: VS:  BP (!) 132/90   Pulse 80   Ht 5\' 11"  (1.803 m)   Wt 276 lb (125.2 kg)   SpO2 93%   BMI 38.49 kg/m , BMI Body mass index is 38.49 kg/m.  Wt Readings from Last 3 Encounters:  09/04/23 276 lb (125.2 kg)  07/13/23 276 lb (125.2 kg)  07/09/23 277 lb (125.6 kg)    General: Patient appears comfortable at rest. HEENT: Conjunctiva and lids normal. Neck: Supple, no elevated JVP or carotid bruits. Lungs: Clear to auscultation, nonlabored breathing at rest. Cardiac: Regular rate and rhythm, no S3 or significant systolic murmur. Extremities: No pitting edema.  ECG:  An ECG dated 02/03/2023 was personally reviewed today and demonstrated:  Sinus rhythm with right bundle branch block and left posterior fascicular block.  Labwork: 12/05/2022: Magnesium 2.1 12/14/2022: B Natriuretic Peptide 68.0 01/12/2023: ALT 22; AST 21 09/01/2023: BUN 21; Creatinine, Ser 1.09; Hemoglobin 13.8; Platelets 281; Potassium 3.4; Sodium 140     Component Value Date/Time   CHOL 157 01/12/2023 0912   TRIG 99 01/12/2023 0912   HDL 42 01/12/2023 0912   CHOLHDL 3.7 01/12/2023 0912   LDLCALC 97 01/12/2023 0912   Other Studies Reviewed Today:  Echocardiogram 11/25/2022:  1. Left ventricular ejection fraction, by estimation, is 70 to 75%. The  left ventricle has hyperdynamic function. The left  ventricle has no  regional wall motion abnormalities. There is mild left ventricular  hypertrophy. Left ventricular diastolic  parameters are consistent with Grade I diastolic dysfunction (impaired  relaxation).   2. Right ventricular systolic function is normal. The right ventricular  size is normal. Tricuspid regurgitation signal is inadequate for assessing  PA pressure.   3. The mitral valve is grossly normal, mildly calcified. Trivial mitral  valve regurgitation.   4. The aortic valve is tricuspid. Aortic valve regurgitation is not  visualized. Aortic valve mean gradient measures 4.0 mmHg.   5. Unable to estimate CVP.   Assessment and Plan:  1.  Paroxysmal atrial fibrillation with CHA2DS2-VASc score of 1, diagnosis made in December 2023 in the setting of comorbid illness and hospital stay.  He has a history of intermittent palpitations at baseline suggesting recurring arrhythmia over time, and his likelihood of progressive arrhythmia is fairly high in the setting of OSA and diastolic dysfunction.  Plan to continue Eliquis for stroke prophylaxis.  Heart rate regular today and he does not report any interval palpitations.   2.  HFpEF, LVEF 70 to 75% with mild diastolic dysfunction and normal RV contraction by echocardiogram in December 2023.  Weight is stable.  Continue Aldactone along with Lasix and potassium supplement.  I reviewed his recent lab work.   3.  Essential hypertension.   4.  Mixed hyperlipidemia.  LDL 97 in January.  Continue Lipitor.   5.  OSA on BiPAP.  Disposition:  Follow up  6 months.  Signed, Jonelle Sidle, M.D., F.A.C.C. [facility]

## 2023-09-08 DIAGNOSIS — G4733 Obstructive sleep apnea (adult) (pediatric): Secondary | ICD-10-CM | POA: Diagnosis not present

## 2023-09-13 ENCOUNTER — Other Ambulatory Visit: Payer: Self-pay | Admitting: Internal Medicine

## 2023-09-28 ENCOUNTER — Encounter (INDEPENDENT_AMBULATORY_CARE_PROVIDER_SITE_OTHER): Payer: 59 | Admitting: Family Medicine

## 2023-09-28 DIAGNOSIS — L700 Acne vulgaris: Secondary | ICD-10-CM | POA: Diagnosis not present

## 2023-09-29 MED ORDER — DOXYCYCLINE HYCLATE 100 MG PO TABS: 100.0000 mg | ORAL_TABLET | Freq: Two times a day (BID) | ORAL | 0 refills | Status: AC

## 2023-09-29 MED ORDER — CLINDAMYCIN PHOS-BENZOYL PEROX 1-5 % EX GEL: Freq: Two times a day (BID) | CUTANEOUS | 12 refills | Status: DC

## 2023-09-29 NOTE — Telephone Encounter (Signed)

## 2023-09-30 ENCOUNTER — Encounter: Payer: Self-pay | Admitting: *Deleted

## 2023-10-04 ENCOUNTER — Other Ambulatory Visit: Payer: Self-pay | Admitting: Family Medicine

## 2023-10-08 DIAGNOSIS — G4733 Obstructive sleep apnea (adult) (pediatric): Secondary | ICD-10-CM | POA: Diagnosis not present

## 2023-10-13 ENCOUNTER — Encounter: Payer: Self-pay | Admitting: Family Medicine

## 2023-10-13 ENCOUNTER — Other Ambulatory Visit (HOSPITAL_COMMUNITY): Payer: Self-pay

## 2023-10-13 ENCOUNTER — Telehealth: Payer: Self-pay

## 2023-10-13 NOTE — Telephone Encounter (Signed)
Pharmacy Patient Advocate Encounter   Received notification from Patient Advice Request messages that prior authorization for Spironolactone 25mg  is required/requested.   Insurance verification completed.   The patient is insured through [employer] .   Per test claim: Refill too soon. PA is not needed at this time. Medication was filled 10/04/23. Next eligible fill date is 10/28/23.

## 2023-10-19 ENCOUNTER — Telehealth: Payer: Self-pay | Admitting: Primary Care

## 2023-10-19 NOTE — Telephone Encounter (Signed)
Spoke with patient regarding New Appointment date and time with Ames Dura, NP

## 2023-10-19 NOTE — Telephone Encounter (Signed)
Spoke with patient regarding new appointment date  and time with Ames Dura, NP--Monday 02/01/24 at 10:30 am.   Will mail  information to patient and he voiced his understanding.

## 2023-11-14 ENCOUNTER — Other Ambulatory Visit: Payer: Self-pay | Admitting: Nurse Practitioner

## 2023-11-22 DIAGNOSIS — J9610 Chronic respiratory failure, unspecified whether with hypoxia or hypercapnia: Secondary | ICD-10-CM | POA: Diagnosis not present

## 2023-11-24 ENCOUNTER — Encounter (INDEPENDENT_AMBULATORY_CARE_PROVIDER_SITE_OTHER): Payer: 59 | Admitting: Family Medicine

## 2023-11-24 DIAGNOSIS — M7989 Other specified soft tissue disorders: Secondary | ICD-10-CM | POA: Diagnosis not present

## 2023-11-25 NOTE — Telephone Encounter (Signed)
Please see the MyChart message reply(ies) for my assessment and plan.    This patient gave consent for this Medical Advice Message and is aware that it may result in a bill to their insurance company, as well as the possibility of receiving a bill for a co-payment or deductible. They are an established patient, but are not seeking medical advice exclusively about a problem treated during an in person or video visit in the last seven days. I did not recommend an in person or video visit within seven days of my reply.   I spent a total of 9 minutes cumulative time within 7 days through MyChart messaging.  Karime Scheuermann, DO   

## 2023-11-26 ENCOUNTER — Other Ambulatory Visit: Payer: 59

## 2023-11-26 ENCOUNTER — Other Ambulatory Visit: Payer: Self-pay | Admitting: Family Medicine

## 2023-11-26 DIAGNOSIS — M7989 Other specified soft tissue disorders: Secondary | ICD-10-CM

## 2023-11-27 ENCOUNTER — Encounter: Payer: Self-pay | Admitting: Family Medicine

## 2023-11-27 LAB — CMP14+EGFR
ALT: 18 [IU]/L (ref 0–44)
AST: 20 [IU]/L (ref 0–40)
Albumin: 4.7 g/dL (ref 3.8–4.9)
Alkaline Phosphatase: 140 [IU]/L — ABNORMAL HIGH (ref 44–121)
BUN/Creatinine Ratio: 13 (ref 9–20)
BUN: 17 mg/dL (ref 6–24)
Bilirubin Total: 0.3 mg/dL (ref 0.0–1.2)
CO2: 26 mmol/L (ref 20–29)
Calcium: 10.1 mg/dL (ref 8.7–10.2)
Chloride: 105 mmol/L (ref 96–106)
Creatinine, Ser: 1.27 mg/dL (ref 0.76–1.27)
Globulin, Total: 2.9 g/dL (ref 1.5–4.5)
Glucose: 113 mg/dL — ABNORMAL HIGH (ref 70–99)
Potassium: 4.6 mmol/L (ref 3.5–5.2)
Sodium: 146 mmol/L — ABNORMAL HIGH (ref 134–144)
Total Protein: 7.6 g/dL (ref 6.0–8.5)
eGFR: 65 mL/min/{1.73_m2} (ref >59)

## 2023-11-27 LAB — ARTHRITIS PANEL
Basophils Absolute: 0 10*3/uL (ref 0.0–0.2)
Basos: 0 %
EOS (ABSOLUTE): 0.1 10*3/uL (ref 0.0–0.4)
Eos: 1 %
Hematocrit: 45.4 % (ref 37.5–51.0)
Hemoglobin: 14.5 g/dL (ref 13.0–17.7)
Immature Grans (Abs): 0 10*3/uL (ref 0.0–0.1)
Immature Granulocytes: 1 %
Lymphocytes Absolute: 3.2 10*3/uL — ABNORMAL HIGH (ref 0.7–3.1)
Lymphs: 40 %
MCH: 28.8 pg (ref 26.6–33.0)
MCHC: 31.9 g/dL (ref 31.5–35.7)
MCV: 90 fL (ref 79–97)
Monocytes Absolute: 0.7 10*3/uL (ref 0.1–0.9)
Monocytes: 9 %
Neutrophils Absolute: 3.8 10*3/uL (ref 1.4–7.0)
Neutrophils: 49 %
Platelets: 299 10*3/uL (ref 150–450)
RBC: 5.04 x10E6/uL (ref 4.14–5.80)
RDW: 12.5 % (ref 11.6–15.4)
Rheumatoid fact SerPl-aCnc: <10 [IU]/mL (ref <14.0)
Sed Rate: 2 mm/h (ref 0–30)
Uric Acid: 4.7 mg/dL (ref 3.8–8.4)
WBC: 7.9 10*3/uL (ref 3.4–10.8)

## 2023-11-27 LAB — BRAIN NATRIURETIC PEPTIDE: BNP: 6 pg/mL (ref 0.0–100.0)

## 2023-11-27 LAB — ANA W/REFLEX IF POSITIVE: Anti Nuclear Antibody (ANA): NEGATIVE

## 2023-11-29 ENCOUNTER — Other Ambulatory Visit: Payer: Self-pay | Admitting: Family Medicine

## 2023-11-29 DIAGNOSIS — J3089 Other allergic rhinitis: Secondary | ICD-10-CM

## 2023-11-30 ENCOUNTER — Encounter: Payer: Self-pay | Admitting: Family Medicine

## 2023-12-08 ENCOUNTER — Ambulatory Visit: Payer: 59 | Admitting: Primary Care

## 2023-12-18 ENCOUNTER — Encounter: Payer: Self-pay | Admitting: Family

## 2023-12-18 ENCOUNTER — Telehealth (INDEPENDENT_AMBULATORY_CARE_PROVIDER_SITE_OTHER): Payer: 59 | Admitting: Family

## 2023-12-18 ENCOUNTER — Encounter: Payer: Self-pay | Admitting: Family Medicine

## 2023-12-18 DIAGNOSIS — M25531 Pain in right wrist: Secondary | ICD-10-CM

## 2023-12-18 MED ORDER — PREDNISONE 20 MG PO TABS: 40.0000 mg | ORAL_TABLET | Freq: Every day | ORAL | 0 refills | Status: AC

## 2023-12-18 NOTE — Progress Notes (Signed)
 Virtual Visit Consent   Kyle Burns, you are scheduled for a virtual visit with a [facility] provider today. Just as with appointments in the office, your consent must be obtained to participate. Your consent will be active for this visit and any virtual visit you may have with one of our providers in the next 365 days. If you have a MyChart account, a copy of this consent can be sent to you electronically.  As this is a virtual visit, video technology does not allow for your provider to perform a traditional examination. This may limit your provider's ability to fully assess your condition. If your provider identifies any concerns that need to be evaluated in person or the need to arrange testing (such as labs, EKG, etc.), we will make arrangements to do so. Although advances in technology are sophisticated, we cannot ensure that it will always work on either your end or our end. If the connection with a video visit is poor, the visit may have to be switched to a telephone visit. With either a video or telephone visit, we are not always able to ensure that we have a secure connection.  By engaging in this virtual visit, you consent to the provision of healthcare and authorize for your insurance to be billed (if applicable) for the services provided during this visit. Depending on your insurance coverage, you may receive a charge related to this service.  I need to obtain your verbal consent now. Are you willing to proceed with your visit today? Kyle Burns has provided verbal consent on 12/18/2023 for a virtual visit (video or telephone). Kyle Learn, FNP  Date: 12/18/2023 10:51 AM  Virtual Visit via Video Note   I, Kyle Burns, connected with  Kyle Burns  ([id number], 60-May-1967) on 12/18/23 at 10:40 AM EST by a video-enabled telemedicine application and verified that I am speaking with the correct person using two identifiers.  Location: Patient: Virtual Visit Location Patient:  Other: car Provider: Virtual Visit Location Provider: Home Office   I discussed the limitations of evaluation and management by telemedicine and the availability of in person appointments. The patient expressed understanding and agreed to proceed.    History of Present Illness:  Kyle Burns is a 58 y.o. who identifies as a male who was assigned male at birth, and is being seen today for right hand swelling and pain that started a week ago.  HPI: Hand Pain  The incident occurred more than 1 week ago. There was no injury mechanism. The pain is present in the right hand. The quality of the pain is described as aching. The pain is at a severity of 5/10. The pain is mild. The pain has been Intermittent since the incident. Associated symptoms include numbness. Nothing aggravates the symptoms. He has tried rest, immobilization and acetaminophen  for the symptoms. The treatment provided mild relief.    Problems:  Patient Active Problem List   Diagnosis Date Noted   Paroxysmal A-fib (HCC) 01/12/2023   Arm DVT (deep venous thromboembolism), acute, right (HCC) 12/29/2022   Normocytic anemia 12/29/2022   Hypokalemia 12/29/2022   Septic shock (HCC) 11/28/2022   AKI (acute kidney injury) (HCC) 11/28/2022   Acute respiratory failure with hypoxia (HCC) 11/26/2022   Atrial fibrillation with rapid ventricular response (HCC) 11/26/2022   Small bowel obstruction (HCC) 11/26/2022   Incarcerated umbilical hernia 11/22/2022   Nodule of right lung 11/22/2022   Mild intermittent asthma 04/02/2022   BMI 40.0-44.9, adult (HCC)  11/25/2021   Acute pain of both shoulders 08/27/2021   Shortness of breath 11/13/2020   Skin tag 08/13/2020   Situational anxiety 03/27/2020   Medication management 01/11/2020   Annual physical exam 01/11/2020   Eustachian tube dysfunction 01/11/2020   Chronic arthritis 11/16/2019   Gastroesophageal reflux disease 07/21/2016   Obstructive sleep apnea 05/23/2015   Chronic  obstructive airway disease with asthma (HCC) 06/14/2014   Left hemidiaphragm paralysis 02/02/2013   Class 3 severe obesity with serious comorbidity and body mass index (BMI) of 40.0 to 44.9 in adult [facility]) 02/02/2013   Essential hypertension with goal blood pressure less than 130/80 02/02/2013   Hyperlipidemia 02/02/2013   Right bundle branch block 02/02/2013    Allergies: No Known Allergies Medications:  Current Outpatient Medications:    predniSONE  (DELTASONE ) 20 MG tablet, Take 2 tablets (40 mg total) by mouth daily with breakfast for 5 days., Disp: 10 tablet, Rfl: 0   acetaminophen  (TYLENOL ) 325 MG tablet, Take 2 tablets (650 mg total) by mouth every 6 (six) hours as needed for mild pain, fever or headache., Disp: , Rfl:    albuterol  (VENTOLIN  HFA) 108 (90 Base) MCG/ACT inhaler, USE 2 PUFFS EVERY 6 HOURS AS NEEDED FOR WHEEZING (1 for home, 1 for work) (Patient taking differently: Inhale 2 puffs into the lungs every 6 (six) hours as needed for wheezing.), Disp: 36 g, Rfl: 4   apixaban  (ELIQUIS ) 5 MG TABS tablet, Take 1 tablet (5 mg total) by mouth 2 (two) times daily., Disp: 180 tablet, Rfl: 3   atorvastatin  (LIPITOR) 20 MG tablet, Take 1 tablet (20 mg total) by mouth daily. Change in dose (Patient taking differently: Take 40 mg by mouth daily. Change in dose), Disp: 90 tablet, Rfl: 3   budesonide -formoterol  (SYMBICORT ) 80-4.5 MCG/ACT inhaler, Inhale 2 puffs into the lungs 2 (two) times daily. (Patient taking differently: Inhale 2 puffs into the lungs daily as needed (wheezing).), Disp: 30.6 g, Rfl: 4   clindamycin -benzoyl peroxide (BENZACLIN) gel, Apply topically 2 (two) times daily. For acne breakouts, Disp: 25 g, Rfl: 12   esomeprazole  (NEXIUM ) 40 MG capsule, Take 1 capsule (40 mg total) by mouth in the morning., Disp: 90 capsule, Rfl: 3   famotidine  (PEPCID ) 20 MG tablet, TAKE 1 TABLET BY MOUTH DAILY AFTER SUPPER, Disp: 30 tablet, Rfl: 11   FLUoxetine  (PROZAC ) 20 MG capsule, Take 1 capsule  (20 mg total) by mouth daily., Disp: 90 capsule, Rfl: 3   fluticasone  (FLONASE ) 50 MCG/ACT nasal spray, Place 2 sprays into both nostrils daily. (Patient taking differently: Place 2 sprays into both nostrils in the morning.), Disp: 16 g, Rfl: 6   furosemide  (LASIX ) 20 MG tablet, TAKE ONE (1) TABLET BY MOUTH EVERY DAY, Disp: 30 tablet, Rfl: 2   levocetirizine (XYZAL ) 5 MG tablet, Take 1 tablet (5 mg total) by mouth every evening., Disp: 90 tablet, Rfl: 3   montelukast  (SINGULAIR ) 10 MG tablet, TAKE ONE TABLET BY MOUTH AT BEDTIME, Disp: 90 tablet, Rfl: 1   Multiple Vitamins-Minerals (ONE-A-DAY MENS 50+) TABS, Take 1 tablet by mouth in the morning., Disp: , Rfl:    neomycin -bacitracin -polymyxin (NEOSPORIN) [phone] ointment, Apply topically daily. Apply to your incision site and belly button daily after bathing, Disp: 28.3 g, Rfl: 0   potassium chloride  (KLOR-CON ) 10 MEQ tablet, Take 1 tablet (10 mEq total) by mouth daily., Disp: 90 tablet, Rfl: 1   spironolactone  (ALDACTONE ) 25 MG tablet, Take 1 tablet (25 mg total) by mouth daily., Disp: 90 tablet, Rfl:  3  Observations/Objective: Patient is well-developed, well-nourished in no acute distress.  Resting comfortably Head is normocephalic, atraumatic.  No labored breathing.  Speech is clear and coherent with logical content.  Patient is alert and oriented at baseline.  Right wrist mildly swollen, full ROM   Assessment and Plan: 1. Right wrist pain (Primary) - predniSONE  (DELTASONE ) 20 MG tablet; Take 2 tablets (40 mg total) by mouth daily with breakfast for 5 days.  Dispense: 10 tablet; Refill: 0  Can not take NSAID's because of Eliquis   Continue to wear splint  ROM exercises  Follow up if symptoms worsen or do not improve   Follow Up Instructions: I discussed the assessment and treatment plan with the patient. The patient was provided an opportunity to ask questions and all were answered. The patient agreed with the plan and demonstrated  an understanding of the instructions.  A copy of instructions were sent to the patient via MyChart unless otherwise noted below.     The patient was advised to call back or seek an in-person evaluation if the symptoms worsen or if the condition fails to improve as anticipated.    Kyle Learn, FNP

## 2023-12-18 NOTE — Patient Instructions (Signed)
 Wrist Pain, Adult There are many things that can cause wrist pain. Some common causes include: An injury to the wrist area, such as a sprain, strain, or broken bone (fracture). Overuse of the joint. A condition that causes increased pressure on a nerve in the wrist (carpal tunnel syndrome). Wear and tear of the joints that occurs with aging (osteoarthritis). Other types of joint inflammation and stiffness (arthritis). Sometimes, the cause of wrist pain is not known. Often, the pain goes away when you follow instructions from your health care provider for relieving pain at home. This may include resting the wrist, icing the wrist, or using a splint or an elastic wrap for a short time. If your wrist pain continues, it is important to tell your provider. Follow these instructions at home: If you have a removable splint or elastic wrap: Wear the splint or wrap as told by your provider. Remove it only as told by your provider. Ask your provider if you may remove it for bathing. Check the skin around the splint or wrap every day. Tell your provider about any concerns. Loosen the splint or wrap if your fingers tingle, become numb, or turn cold and blue. Keep the splint or wrap clean. If the splint or wrap is not waterproof: Do not let it get wet. Cover it with a watertight covering when you take a bath or shower. Managing pain, stiffness, and swelling  If told, put ice on the painful area. If you have a removable splint or wrap, remove it as told by your provider. Put ice in a plastic bag. Place a towel between your skin and the bag or between your splint or wrap and the bag. Leave the ice on for 20 minutes, 2-3 times a day. If your skin turns bright red, remove the ice right away to prevent skin damage. The risk of damage is higher if you cannot feel pain, heat, or cold. Move your fingers often to reduce stiffness and swelling. Raise (elevate) the injured area above the level of your heart while  you are sitting or lying down. Activity Rest your affected wrist as told by your provider. Return to your normal activities as told by your provider. Ask your provider what activities are safe for you. Ask your provider when it is safe to drive if you have a splint or wrap on your wrist. Do exercises as told by your provider. General instructions Pay attention to any changes in your symptoms. Take over-the-counter and prescription medicines only as told by your provider. Contact a health care provider if: You have a sudden, sharp pain in the wrist, hand, or arm that is different or new. Any swelling or bruising on your wrist or hand gets worse. Your skin becomes red, has a rash, or has open sores. Your pain does not get better or it gets worse. You have a fever or chills. Get help right away if: You lose feeling in your fingers or hand. Your fingers turn white, very red, or cold and blue. You cannot move your fingers. This information is not intended to replace advice given to you by your health care provider. Make sure you discuss any questions you have with your health care provider. Document Revised: 09/05/2022 Document Reviewed: 09/05/2022 Elsevier Patient Education  2024 [employer].

## 2023-12-23 DIAGNOSIS — J9610 Chronic respiratory failure, unspecified whether with hypoxia or hypercapnia: Secondary | ICD-10-CM | POA: Diagnosis not present

## 2023-12-24 ENCOUNTER — Encounter: Payer: Self-pay | Admitting: Family Medicine

## 2024-01-03 ENCOUNTER — Other Ambulatory Visit: Payer: Self-pay | Admitting: Family Medicine

## 2024-01-04 MED ORDER — SPIRONOLACTONE 25 MG PO TABS: 25.0000 mg | ORAL_TABLET | Freq: Every day | ORAL | 0 refills | Status: DC

## 2024-01-04 NOTE — Telephone Encounter (Signed)
Yes, this is managed by cardiology.  I will cc Dr Diona Browner and NP Brion Aliment to ensure they still want him on this med.

## 2024-01-04 NOTE — Addendum Note (Signed)
Addended by: Raliegh Ip on: 01/04/2024 10:16 AM   Modules accepted: Orders

## 2024-01-11 ENCOUNTER — Other Ambulatory Visit: Payer: Self-pay | Admitting: Family Medicine

## 2024-01-11 DIAGNOSIS — I480 Paroxysmal atrial fibrillation: Secondary | ICD-10-CM

## 2024-01-11 DIAGNOSIS — I82621 Acute embolism and thrombosis of deep veins of right upper extremity: Secondary | ICD-10-CM

## 2024-01-20 ENCOUNTER — Encounter: Payer: 59 | Admitting: Family Medicine

## 2024-01-23 DIAGNOSIS — J9610 Chronic respiratory failure, unspecified whether with hypoxia or hypercapnia: Secondary | ICD-10-CM | POA: Diagnosis not present

## 2024-01-24 ENCOUNTER — Other Ambulatory Visit: Payer: Self-pay | Admitting: Cardiology

## 2024-02-01 ENCOUNTER — Encounter: Payer: Self-pay | Admitting: Primary Care

## 2024-02-01 ENCOUNTER — Ambulatory Visit: Payer: 59 | Admitting: Primary Care

## 2024-02-01 VITALS — BP 141/87 | HR 95 | Ht 71.0 in | Wt 293.0 lb

## 2024-02-01 DIAGNOSIS — G4733 Obstructive sleep apnea (adult) (pediatric): Secondary | ICD-10-CM | POA: Diagnosis not present

## 2024-02-01 DIAGNOSIS — J4520 Mild intermittent asthma, uncomplicated: Secondary | ICD-10-CM | POA: Diagnosis not present

## 2024-02-01 NOTE — Patient Instructions (Addendum)
-  ASTHMA: Asthma is a condition where your airways narrow and swell, producing extra mucus, which can make breathing difficult. You are currently managing your mild intermittent asthma with Symbicort as needed. Continue using it for flare-ups and try using it if you feel short of breath to see if it helps.  -OBSTRUCTIVE SLEEP APNEA: Obstructive sleep apnea is a condition where your breathing repeatedly stops and starts during sleep due to throat muscles intermittently relaxing and blocking the airway. You are using a loaner BiPAP machine but feel it is not providing enough pressure. We will check the data on your machine's SD card for pressure settings and compliance. If we can't get the data, you should take the machine to the medical supply store for a compliance report. If necessary, we may need to repeat a sleep study to adjust the settings. Once compliance is confirmed, we will order a new BiPAP machine for you. Follow up in 6 months or 31-90 days after receiving the new machine for a compliance report.  -WEIGHT GAIN: Weight gain can contribute to various health issues, including shortness of breath. We encourage you to adopt lifestyle changes to help with weight loss. If your shortness of breath continues or worsens, we may need to evaluate further.  INSTRUCTIONS:  Please bring SD card by tomorrow for compliance download/if no data on card you will need to bring CPAP machine by [employer] for manual download and have them fax report to [place]   Please follow up in 6 months or 31-90 days after receiving your new BiPAP machine for a compliance report.

## 2024-02-01 NOTE — Progress Notes (Addendum)
 @Patient  ID: Kyle Burns, male    DOB: 1966/08/03, 58 y.o.   MRN: [id number]  Chief Complaint  Patient presents with   Sleep Apnea    BiPAP/[employer]    Referring provider: Eliodoro Guerin, DO  HPI: 58 year old male, never smoked. PMH significant for afib, HTN, DVT, asthma, left hemidiaphragm paralysis, GERD, SBO, AKI, HLD.   02/01/2024- Interim hx  Discussed the use of AI scribe software for clinical note transcription with the patient, who gave verbal consent to proceed.  History of Present Illness   Kyle Burns "Samuel Crock" is a 58 year old male with mild intermittent asthma and sleep apnea who presents for follow-up of his respiratory conditions.  He has mild intermittent asthma and uses Symbicort  80 micrograms, two puffs twice a day as needed for flare-ups. He has not used it recently due to the absence of asthma symptoms such as chest tightness, wheezing, or cough. He feels winded quickly, attributing this to weight gain rather than asthma, as he does not experience difficulty breathing.  He has a history of sleep apnea and uses a BiPAP machine nightly, missing it occasionally once or twice a month. He feels the current loaner machine does not provide enough air pressure, especially during inhalation. His previous machine was replaced due to malfunction, and he is using a loaner while awaiting a new machine. A change in insurance after retirement affected his equipment acquisition.  He has gained back weight lost after a hospital stay in December 2023, where he underwent hernia surgery and experienced complications. He believes his weight is similar to when he initially received the BiPAP machine. He has adopted his five-year-old nephew following the passing of the child's mother.      BIPAP pressure 17/13cm h20 with 2L oxygen  at night  DME company is [employer]    Pulmonary function testing: PFT 06/04/18 >> FEV1 2.11 (62%), FEV1% 89, TLC 4.89 (68%), DLCO 62%   Sleep testing:   PSG 05/17/15 >> AHI 29.5, SpO2 low 61%    No Known Allergies  Immunization History  Administered Date(s) Administered   Influenza Split 10/15/2013, 09/14/2014, 08/30/2015   Influenza, Quadrivalent, Recombinant, Inj, Pf 09/27/2019, 09/29/2022   Influenza,inj,Quad PF,6+ Mos 10/02/2016, 09/21/2017, 09/03/2020   Influenza,trivalent, recombinat, inj, PF 09/11/2023   Influenza-Unspecified 10/15/2013, 09/14/2014, 09/29/2014, 08/30/2015, 10/02/2016, 09/21/2017, 10/14/2017   Moderna SARS-COV2 Booster Vaccination 11/05/2020   Moderna Sars-Covid-2 Vaccination 03/22/2020, 04/19/2020   Pneumococcal Polysaccharide-23 12/15/2010   Tdap 03/20/2016, 09/11/2023   Zoster Recombinant(Shingrix) 01/04/2021, 12/04/2021    Past Medical History:  Diagnosis Date   Arm DVT (deep venous thromboembolism), acute, right (HCC)    a. 12/2022 U/S R subclavian and axillary DVT (had PICC line during hosp in 11/2022).  Eliquis  initiated.   Back pain    Diastolic dysfunction    a. 04/2018 Echo: EF 60-65%; b. 11/2022 Echo: EF 70-75%, no rwma, Gr I DD, nl RV fxn, triv MR.   GERD (gastroesophageal reflux disease)    History of asthma    HTN (hypertension)    Hyperlipidemia    Incarcerated umbilical hernia    a. 11/2022 s/p repair. Hosp course complicated by post operative ileus, abdominal wall hematoma requiring evacuation, hypoxic respiratory failure requiring intubation, sepsis, and A-fib with RVR.   Morbid obesity (HCC)    OSA (obstructive sleep apnea)    a. On BiPAP QHS.   PAF (paroxysmal atrial fibrillation) (HCC)    a. 11/2022 - developed in setting of SBO/VDRF->briefly required amio. CHA2DS2VASc =  1-->No OAC for Afib.    Tobacco History: Social History   Tobacco Use  Smoking Status Never  Smokeless Tobacco Current   Types: Chew   Last attempt to quit: 10/2019  Tobacco Comments   Uses chew "when he has a bad day"   Ready to quit: No Counseling given: Yes Tobacco comments: Uses chew "when he has a bad  day"   Outpatient Medications Prior to Visit  Medication Sig Dispense Refill   acetaminophen  (TYLENOL ) 325 MG tablet Take 2 tablets (650 mg total) by mouth every 6 (six) hours as needed for mild pain, fever or headache.     albuterol  (VENTOLIN  HFA) 108 (90 Base) MCG/ACT inhaler USE 2 PUFFS EVERY 6 HOURS AS NEEDED FOR WHEEZING (1 for home, 1 for work) (Patient taking differently: Inhale 2 puffs into the lungs every 6 (six) hours as needed for wheezing.) 36 g 4   apixaban  (ELIQUIS ) 5 MG TABS tablet TAKE ONE (1) TABLET BY MOUTH TWO (2) TIMES DAILY 180 tablet 0   atorvastatin  (LIPITOR) 20 MG tablet Take 1 tablet (20 mg total) by mouth daily. Change in dose (Patient taking differently: Take 40 mg by mouth daily. Change in dose) 90 tablet 3   budesonide -formoterol  (SYMBICORT ) 80-4.5 MCG/ACT inhaler Inhale 2 puffs into the lungs 2 (two) times daily. (Patient taking differently: Inhale 2 puffs into the lungs daily as needed (wheezing).) 30.6 g 4   esomeprazole  (NEXIUM ) 40 MG capsule Take 1 capsule (40 mg total) by mouth in the morning. 90 capsule 3   famotidine  (PEPCID ) 20 MG tablet TAKE 1 TABLET BY MOUTH DAILY AFTER SUPPER 30 tablet 11   FLUoxetine  (PROZAC ) 20 MG capsule Take 1 capsule (20 mg total) by mouth daily. 90 capsule 3   fluticasone  (FLONASE ) 50 MCG/ACT nasal spray Place 2 sprays into both nostrils daily. (Patient taking differently: Place 2 sprays into both nostrils in the morning.) 16 g 6   furosemide  (LASIX ) 20 MG tablet TAKE ONE (1) TABLET BY MOUTH EVERY DAY 30 tablet 2   levocetirizine (XYZAL ) 5 MG tablet Take 1 tablet (5 mg total) by mouth every evening. 90 tablet 3   montelukast  (SINGULAIR ) 10 MG tablet TAKE ONE TABLET BY MOUTH AT BEDTIME 90 tablet 1   Multiple Vitamins-Minerals (ONE-A-DAY MENS 50+) TABS Take 1 tablet by mouth in the morning.     potassium chloride  (KLOR-CON ) 10 MEQ tablet TAKE ONE (1) TABLET BY MOUTH EVERY DAY 90 tablet 1   spironolactone  (ALDACTONE ) 25 MG tablet Take 1  tablet (25 mg total) by mouth daily. 90 tablet 0   clindamycin -benzoyl peroxide (BENZACLIN) gel Apply topically 2 (two) times daily. For acne breakouts 25 g 12   neomycin -bacitracin -polymyxin (NEOSPORIN) [phone] ointment Apply topically daily. Apply to your incision site and belly button daily after bathing 28.3 g 0   No facility-administered medications prior to visit.   Review of Systems  Review of Systems  Constitutional: Negative.  Negative for fatigue.  HENT: Negative.    Respiratory:  Positive for shortness of breath. Negative for cough, chest tightness and wheezing.   Cardiovascular: Negative.   Psychiatric/Behavioral:  Positive for sleep disturbance.     Physical Exam  BP (!) 141/87   Pulse 95   Ht 5\' 11"  (1.803 m)   Wt 293 lb (132.9 kg)   SpO2 97%   BMI 40.87 kg/m  Physical Exam Constitutional:      General: He is not in acute distress.    Appearance: Normal appearance. He is  obese. He is not ill-appearing.  HENT:     Mouth/Throat:     Mouth: Mucous membranes are moist.     Pharynx: Oropharynx is clear.  Cardiovascular:     Rate and Rhythm: Normal rate and regular rhythm.  Pulmonary:     Effort: Pulmonary effort is normal.     Breath sounds: Normal breath sounds. No wheezing or rhonchi.  Skin:    General: Skin is warm and dry.  Neurological:     General: No focal deficit present.     Mental Status: He is alert and oriented to person, place, and time. Mental status is at baseline.  Psychiatric:        Mood and Affect: Mood normal.        Behavior: Behavior normal.        Thought Content: Thought content normal.        Judgment: Judgment normal.      Lab Results:  CBC    Component Value Date/Time   WBC 7.9 11/26/2023 0945   WBC 7.9 09/01/2023 0804   RBC 5.04 11/26/2023 0945   RBC 4.78 09/01/2023 0804   HGB 14.5 11/26/2023 0945   HCT 45.4 11/26/2023 0945   PLT 299 11/26/2023 0945   MCV 90 11/26/2023 0945   MCH 28.8 11/26/2023 0945   MCH 28.9  09/01/2023 0804   MCHC 31.9 11/26/2023 0945   MCHC 32.5 09/01/2023 0804   RDW 12.5 11/26/2023 0945   LYMPHSABS 3.2 (H) 11/26/2023 0945   MONOABS 2.9 (H) 11/26/2022 0925   EOSABS 0.1 11/26/2023 0945   BASOSABS 0.0 11/26/2023 0945    BMET    Component Value Date/Time   NA 146 (H) 11/26/2023 0945   K 4.6 11/26/2023 0945   CL 105 11/26/2023 0945   CO2 26 11/26/2023 0945   GLUCOSE 113 (H) 11/26/2023 0945   GLUCOSE 109 (H) 09/01/2023 0804   BUN 17 11/26/2023 0945   CREATININE 1.27 11/26/2023 0945   CALCIUM  10.1 11/26/2023 0945   GFRNONAA >60 09/01/2023 0804   GFRAA 81 11/16/2019 1040    BNP    Component Value Date/Time   BNP 6.0 11/26/2023 0945   BNP 68.0 12/14/2022 1704    ProBNP    Component Value Date/Time   PROBNP 46.0 11/13/2020 1106    Imaging: No results found.   Assessment & Plan:   1. OSA treated with BiPAP (Primary)  2. Mild intermittent asthma without complication     Asthma Mild intermittent symptoms, currently managed with Symbicort  80mcg as needed. No recent exacerbations. Patient reports shortness of breath, but attributes it to weight gain rather than asthma. -Continue Symbicort  as needed for flare-ups. -Encourage patient to use Symbicort  if experiencing shortness of breath to assess if symptoms improve.  Obstructive Sleep Apnea Patient currently using a loaner BiPAP machine, but reports feeling like it's not providing enough pressure during inhalation. Average nightly use is 8 hours. -Check data on SD card for pressure settings and compliance. -If unable to obtain data from SD card, patient to bring BiPAP machine to medical supply store for compliance report. -If unable to obtain necessary data, consider repeat sleep study with titration. -Once compliance confirmed, order new BiPAP machine for patient. -Follow-up in 6 months or 31-90 days after receiving new BiPAP machine for compliance report.  Compliance download  11/10/23-02/07/24 Usage days 89/90 days Pressure IPAP 15/ EPAP 11cm h20 Airleaks 48.5L/min (95%) AHI 0.1   Weight Gain Patient reports significant weight gain, which he believes  is contributing to his shortness of breath. -Encourage lifestyle modifications for weight loss. -Consider further evaluation if shortness of breath persists or worsens.      Antonio Baumgarten, NP 02/01/2024

## 2024-02-06 DIAGNOSIS — G4733 Obstructive sleep apnea (adult) (pediatric): Secondary | ICD-10-CM | POA: Diagnosis not present

## 2024-02-08 ENCOUNTER — Encounter: Payer: Self-pay | Admitting: Family Medicine

## 2024-02-08 ENCOUNTER — Ambulatory Visit (INDEPENDENT_AMBULATORY_CARE_PROVIDER_SITE_OTHER): Payer: 59 | Admitting: Family Medicine

## 2024-02-08 ENCOUNTER — Other Ambulatory Visit: Payer: Self-pay | Admitting: Nurse Practitioner

## 2024-02-08 VITALS — BP 121/73 | HR 81 | Temp 98.6°F | Ht 71.0 in | Wt 291.0 lb

## 2024-02-08 DIAGNOSIS — J4520 Mild intermittent asthma, uncomplicated: Secondary | ICD-10-CM

## 2024-02-08 DIAGNOSIS — Z23 Encounter for immunization: Secondary | ICD-10-CM | POA: Diagnosis not present

## 2024-02-08 DIAGNOSIS — M654 Radial styloid tenosynovitis [de Quervain]: Secondary | ICD-10-CM

## 2024-02-08 DIAGNOSIS — I10 Essential (primary) hypertension: Secondary | ICD-10-CM | POA: Diagnosis not present

## 2024-02-08 DIAGNOSIS — Z0001 Encounter for general adult medical examination with abnormal findings: Secondary | ICD-10-CM | POA: Diagnosis not present

## 2024-02-08 DIAGNOSIS — E66813 Obesity, class 3: Secondary | ICD-10-CM | POA: Diagnosis not present

## 2024-02-08 DIAGNOSIS — Z125 Encounter for screening for malignant neoplasm of prostate: Secondary | ICD-10-CM

## 2024-02-08 DIAGNOSIS — I480 Paroxysmal atrial fibrillation: Secondary | ICD-10-CM

## 2024-02-08 DIAGNOSIS — R7303 Prediabetes: Secondary | ICD-10-CM

## 2024-02-08 DIAGNOSIS — E782 Mixed hyperlipidemia: Secondary | ICD-10-CM | POA: Diagnosis not present

## 2024-02-08 DIAGNOSIS — G4733 Obstructive sleep apnea (adult) (pediatric): Secondary | ICD-10-CM

## 2024-02-08 DIAGNOSIS — Z6841 Body Mass Index (BMI) 40.0 and over, adult: Secondary | ICD-10-CM | POA: Diagnosis not present

## 2024-02-08 DIAGNOSIS — Z0000 Encounter for general adult medical examination without abnormal findings: Secondary | ICD-10-CM

## 2024-02-08 LAB — LIPID PANEL

## 2024-02-08 LAB — BAYER DCA HB A1C WAIVED: HB A1C (BAYER DCA - WAIVED): 6.1 % — ABNORMAL HIGH (ref 4.8–5.6)

## 2024-02-08 MED ORDER — DICLOFENAC SODIUM 1 % EX GEL: 2.0000 g | Freq: Three times a day (TID) | CUTANEOUS | 99 refills | Status: AC | PRN

## 2024-02-08 NOTE — Progress Notes (Signed)
 Kyle Burns is a 58 y.o. male presents to office today for annual physical exam examination.    Concerns today include: 1.  Morbid obesity associated with OSA, hyperlipidemia, atrial fibrillation Patient suffers from morbid obesity and sadly also has obstructive sleep apnea that is treated with BiPAP.  He recently saw pulmonology and no changes were made.  He was advised to reduce his weight to reduce cardiovascular risks.  He denies any complications with Eliquis.  Reports no heart palpitations, bleeding.  Occasionally does get some nasal congestion but this is relieved by Flonase when he remembers to use it  2.  Wrist pain He continues to get some burning wrist pain at the distal radial aspect of the wrist.  Pain is more pronounced with adduction of the wrist.  No gross swelling.  Occasionally uses Tylenol arthritis to relief.  Cannot take NSAIDs secondary to chronic anticoagulation  Occupation: Retired Patent examiner, Marital status: Single, Substance use: None There are no preventive care reminders to display for this patient.  Refills needed today: None  Immunization History  Administered Date(s) Administered   Influenza Split 10/15/2013, 09/14/2014, 08/30/2015   Influenza, Quadrivalent, Recombinant, Inj, Pf 09/27/2019, 09/29/2022   Influenza,inj,Quad PF,6+ Mos 10/02/2016, 09/21/2017, 09/03/2020   Influenza,trivalent, recombinat, inj, PF 09/11/2023   Influenza-Unspecified 10/15/2013, 09/14/2014, 09/29/2014, 08/30/2015, 10/02/2016, 09/21/2017, 10/14/2017   Moderna SARS-COV2 Booster Vaccination 11/05/2020   Moderna Sars-Covid-2 Vaccination 03/22/2020, 04/19/2020   PNEUMOCOCCAL CONJUGATE-20 02/08/2024   Pneumococcal Polysaccharide-23 12/15/2010   Tdap 03/20/2016, 09/11/2023   Zoster Recombinant(Shingrix) 01/04/2021, 12/04/2021   Past Medical History:  Diagnosis Date   Acute respiratory failure with hypoxia (HCC) 11/26/2022   Arm DVT (deep venous thromboembolism), acute,  right (HCC)    a. 12/2022 U/S R subclavian and axillary DVT (had PICC line during hosp in 11/2022).  Eliquis initiated.   Atrial fibrillation with rapid ventricular response (HCC) 11/26/2022   Back pain    Diastolic dysfunction    a. 04/2018 Echo: EF 60-65%; b. 11/2022 Echo: EF 70-75%, no rwma, Gr I DD, nl RV fxn, triv MR.   GERD (gastroesophageal reflux disease)    History of asthma    HTN (hypertension)    Hyperlipidemia    Incarcerated umbilical hernia    a. 11/2022 s/p repair. Hosp course complicated by post operative ileus, abdominal wall hematoma requiring evacuation, hypoxic respiratory failure requiring intubation, sepsis, and A-fib with RVR.   Mild intermittent asthma 04/02/2022   Onset in childhood ?  / played sports fine s needing saba  - 04/02/2022    symbicort 80 2bid   - 09/24/2022  After extensive coaching inhaler device,  effectiveness =   80% HFa> continue symbicort and max rx for gerd  - Allergy screen 09/24/2022 >  Eos 0.1 /  IgE 25   - FENO 12/30/2022  = 13 off symbicort  > rec leave off         Morbid obesity (HCC)    OSA (obstructive sleep apnea)    a. On BiPAP QHS.   PAF (paroxysmal atrial fibrillation) (HCC)    a. 11/2022 - developed in setting of SBO/VDRF->briefly required amio. CHA2DS2VASc = 1-->No OAC for Afib.   Small bowel obstruction (HCC) 11/26/2022   Social History   Socioeconomic History   Marital status: Single    Spouse name: Not on file   Number of children: 0   Years of education: Not on file   Highest education level: 12th grade  Occupational History   Not on  file  Tobacco Use   Smoking status: Never   Smokeless tobacco: Current    Types: Chew    Last attempt to quit: 10/2019   Tobacco comments:    Uses chew "when he has a bad day"  Vaping Use   Vaping status: Never Used  Substance and Sexual Activity   Alcohol use: No    Comment: Socially   Drug use: No   Sexual activity: Not on file  Other Topics Concern   Not on file  Social History  Narrative   Police officer   Currently taking care of his 40 year old nephew after the passing of his sister.   Social Drivers of Corporate investment banker Strain: Low Risk  (02/04/2024)   Overall Financial Resource Strain (CARDIA)    Difficulty of Paying Living Expenses: Not hard at all  Food Insecurity: No Food Insecurity (02/04/2024)   Hunger Vital Sign    Worried About Running Out of Food in the Last Year: Never true    Ran Out of Food in the Last Year: Never true  Transportation Needs: No Transportation Needs (02/04/2024)   PRAPARE - Administrator, Civil Service (Medical): No    Lack of Transportation (Non-Medical): No  Physical Activity: Inactive (02/04/2024)   Exercise Vital Sign    Days of Exercise per Week: 1 day    Minutes of Exercise per Session: 0 min  Stress: No Stress Concern Present (02/04/2024)   [employer] of Occupational Health - Occupational Stress Questionnaire    Feeling of Stress : Only a little  Social Connections: Moderately Isolated (02/04/2024)   Social Connection and Isolation Panel [NHANES]    Frequency of Communication with Friends and Family: More than three times a week    Frequency of Social Gatherings with Friends and Family: More than three times a week    Attends Religious Services: More than 4 times per year    Active Member of [employer] or Organizations: No    Attends Banker Meetings: Not on file    Marital Status: Never married  Intimate Partner Violence: Not At Risk (11/22/2022)   Humiliation, Afraid, Rape, and Kick questionnaire    Fear of Current or Ex-Partner: No    Emotionally Abused: No    Physically Abused: No    Sexually Abused: No   Past Surgical History:  Procedure Laterality Date   bilateral hernia repair     total of 3    CYST REMOVAL NECK     age - 44s   HEMATOMA EVACUATION N/A 11/30/2022   Procedure: EVACUATION HEMATOMA;  Surgeon: Lewie Chamber, DO;  Location: [facility];  Service: General;   Laterality: N/A;   UMBILICAL HERNIA REPAIR N/A 11/22/2022   Procedure: HERNIA REPAIR UMBILICAL ADULT,  WITH MESH;  Surgeon: Lewie Chamber, DO;  Location: [facility];  Service: General;  Laterality: N/A;   Family History  Problem Relation Age of Onset   Crohn's disease Mother    Arthritis Mother        back pain    Diabetes Mother    Asthma Father    Cancer Father        stomach   Arthritis Father    Breast cancer Sister    Hypertension Sister    Breast cancer Sister        stage 4   Multiple sclerosis Brother    Hypertension Maternal Grandmother    Asthma Maternal Grandfather  Diabetes Maternal Grandfather    Stroke Maternal Grandfather    Asthma Paternal Grandfather     Current Outpatient Medications:    acetaminophen (TYLENOL) 325 MG tablet, Take 2 tablets (650 mg total) by mouth every 6 (six) hours as needed for mild pain, fever or headache., Disp: , Rfl:    albuterol (VENTOLIN HFA) 108 (90 Base) MCG/ACT inhaler, USE 2 PUFFS EVERY 6 HOURS AS NEEDED FOR WHEEZING (1 for home, 1 for work) (Patient taking differently: Inhale 2 puffs into the lungs every 6 (six) hours as needed for wheezing.), Disp: 36 g, Rfl: 4   apixaban (ELIQUIS) 5 MG TABS tablet, TAKE ONE (1) TABLET BY MOUTH TWO (2) TIMES DAILY, Disp: 180 tablet, Rfl: 0   atorvastatin (LIPITOR) 20 MG tablet, Take 1 tablet (20 mg total) by mouth daily. Change in dose (Patient taking differently: Take 40 mg by mouth daily. Change in dose), Disp: 90 tablet, Rfl: 3   budesonide-formoterol (SYMBICORT) 80-4.5 MCG/ACT inhaler, Inhale 2 puffs into the lungs 2 (two) times daily. (Patient taking differently: Inhale 2 puffs into the lungs daily as needed (wheezing).), Disp: 30.6 g, Rfl: 4   diclofenac Sodium (VOLTAREN) 1 % GEL, Apply 2 g topically 3 (three) times daily as needed (joint pain)., Disp: 200 g, Rfl: PRN   esomeprazole (NEXIUM) 40 MG capsule, Take 1 capsule (40 mg total) by mouth in the morning., Disp: 90 capsule, Rfl: 3    famotidine (PEPCID) 20 MG tablet, TAKE 1 TABLET BY MOUTH DAILY AFTER SUPPER, Disp: 30 tablet, Rfl: 11   FLUoxetine (PROZAC) 20 MG capsule, Take 1 capsule (20 mg total) by mouth daily., Disp: 90 capsule, Rfl: 3   fluticasone (FLONASE) 50 MCG/ACT nasal spray, Place 2 sprays into both nostrils daily. (Patient taking differently: Place 2 sprays into both nostrils in the morning.), Disp: 16 g, Rfl: 6   furosemide (LASIX) 20 MG tablet, TAKE ONE (1) TABLET BY MOUTH EVERY DAY, Disp: 30 tablet, Rfl: 2   levocetirizine (XYZAL) 5 MG tablet, Take 1 tablet (5 mg total) by mouth every evening., Disp: 90 tablet, Rfl: 3   montelukast (SINGULAIR) 10 MG tablet, TAKE ONE TABLET BY MOUTH AT BEDTIME, Disp: 90 tablet, Rfl: 1   Multiple Vitamins-Minerals (ONE-A-DAY MENS 50+) TABS, Take 1 tablet by mouth in the morning., Disp: , Rfl:    potassium chloride (KLOR-CON) 10 MEQ tablet, TAKE ONE (1) TABLET BY MOUTH EVERY DAY, Disp: 90 tablet, Rfl: 1   spironolactone (ALDACTONE) 25 MG tablet, Take 1 tablet (25 mg total) by mouth daily., Disp: 90 tablet, Rfl: 0  No Known Allergies   ROS: Review of Systems Pertinent items noted in HPI and remainder of comprehensive ROS otherwise negative.    Physical exam BP 121/73   Pulse 81   Temp 98.6 F (37 C)   Ht 5\' 11"  (1.803 m)   Wt 291 lb (132 kg)   SpO2 96%   BMI 40.59 kg/m  General appearance: alert, cooperative, appears stated age, and no distress, morbidly obese Head: Normocephalic, without obvious abnormality Eyes: negative findings: lids and lashes normal, conjunctivae and sclerae normal, corneas clear, and pupils equal, round, reactive to light and accomodation Ears: normal TM's and external ear canals both ears Nose: Nares normal. Septum midline. Mucosa normal. No drainage or sinus tenderness. Throat: lips, mucosa, and tongue normal; teeth and gums normal Neck: no adenopathy, supple, symmetrical, trachea midline, and thyroid not enlarged, symmetric, no  tenderness/mass/nodules Back: symmetric, no curvature. ROM normal. No CVA tenderness. Lungs:  clear to auscultation bilaterally Chest wall: no tenderness Heart: regular rate and rhythm, S1, S2 normal, no murmur, click, rub or gallop Abdomen:  Protuberant, soft, nontender.  No palpable hepatomegaly but exam is limited by body habitus Extremities: extremities normal, atraumatic, no cyanosis or edema Pulses: 2+ and symmetric Skin: Skin color, texture, turgor normal. No rashes or lesions Lymph nodes: Cervical, supraclavicular, and axillary nodes normal. Neurologic: Grossly normal  MSK: Right wrist with positive Finkelstein test.  No gross swelling, erythema or warmth along the distal radius     02/08/2024    8:09 AM 07/13/2023   12:45 PM 01/12/2023    9:03 AM  Depression screen PHQ 2/9  Decreased Interest 0 0 0  Down, Depressed, Hopeless 0 0 0  PHQ - 2 Score 0 0 0  Altered sleeping 0 0 0  Tired, decreased energy 0 0 0  Change in appetite 0 0 0  Feeling bad or failure about yourself  0 0 0  Trouble concentrating 0 0 0  Moving slowly or fidgety/restless 0 0 0  Suicidal thoughts 0 0 0  PHQ-9 Score 0 0 0  Difficult doing work/chores Not difficult at all Not difficult at all Not difficult at all      02/08/2024    8:08 AM 07/13/2023   12:45 PM 02/09/2023    8:26 AM 01/12/2023    9:03 AM  GAD 7 : Generalized Anxiety Score  Nervous, Anxious, on Edge 0 0 0 0  Control/stop worrying 0 0 0 0  Worry too much - different things 0 0 0 0  Trouble relaxing 0 0 0 0  Restless 0 0 0 0  Easily annoyed or irritable 0 0 0 0  Afraid - awful might happen 0 0 0 0  Total GAD 7 Score 0 0 0 0  Anxiety Difficulty Not difficult at all Not difficult at all  Not difficult at all     Assessment/ Plan: Hartley Barefoot here for annual physical exam.   Annual physical exam  Paroxysmal A-fib (HCC) - Plan: TSH, Magnesium, CBC  OSA treated with BiPAP  Class 3 severe obesity due to excess calories with  serious comorbidity and body mass index (BMI) of 40.0 to 44.9 in adult [facility])  Essential hypertension with goal blood pressure less than 130/80 - Plan: CMP14+EGFR  Prediabetes - Plan: Bayer DCA Hb A1c Waived  Mild intermittent asthma without complication  Mixed hyperlipidemia - Plan: Lipid Panel  Screening for malignant neoplasm of prostate - Plan: PSA  De Quervain's tenosynovitis, right - Plan: diclofenac Sodium (VOLTAREN) 1 % GEL  Encounter for Prevnar pneumococcal vaccination - Plan: Pneumococcal conjugate vaccine 20-valent (Prevnar 20)  Pneumococcal vaccination administered.  We talked about weight loss and recent indication for Zepbound.  He would qualify due to BMI, obstructive sleep apnea treated with BiPAP.  Prediabetes is present with A1c at 6.1 today.  I obtained a fasting lipid panel and have encouraged him to continue all medications as prescribed by his specialists  Screening PSA collected.  Diclofenac gel prescribed for right wrist.  Avoid oral NSAIDs secondary to chronic anticoagulation.  We discussed consideration for corticosteroid injection if symptoms are ongoing.  Handout provided.  Follow-up in 3 months, sooner if concerns arise  Counseled on healthy lifestyle choices, including diet (rich in fruits, vegetables and lean meats and low in salt and simple carbohydrates) and exercise (at least 30 minutes of moderate physical activity daily).  Patient to follow up 41m  Ozell Juhasz M.  Nadine Counts, DO

## 2024-02-08 NOTE — Patient Instructions (Signed)
 Zepbound is now indicated in sleep apnea patients to help them lose weight. Check with your insurance if they are covering yet.  Irritation and Swelling of the Thumb (De Quervain's Syndrome): What to Know  Tommi Rumps Quervain's syndrome causes irritation and swelling of the tendon on the thumb side of the wrist. Tendons are strong tissues that connect muscle to bone.  The tendons in the hand go through a tunnel called a sheath. A slippery layer of tissue helps the tendons move through the sheath. With de Quervain's syndrome, the sheath swells or thickens. This can cause pressure and pain. De Quervain's syndrome is also called de Quervain's disease or de Quervain's tenosynovitis. What are the causes? The exact cause isn't known. It may be from overuse of the hand and wrist. What increases the risk? You're more likely to get [employer] syndrome if: You use your hands far more than normal. This includes if you do certain movements over and over, such as twisting your hand or using a tight grip. You're pregnant. You're male and middle-aged. You have rheumatoid arthritis. You have diabetes. What are the signs or symptoms? The main symptom is pain on the thumb side of your wrist. The pain may get worse when you grasp something or turn your wrist. You may also have: Pain that goes up your forearm. Swelling of your wrist and hand. Trouble moving your thumb and wrist. A feeling of snapping in the wrist. A cyst. This is a pocket of fluid. How is this diagnosed? You may be diagnosed based on your symptoms, medical history, and an exam. The exam may include a Lourena Simmonds test. This is when your health care provider pulls your thumb and wrist to see if it causes pain. You may also have tests. These may include: An X-ray. An MRI. An ultrasound. How is this treated? You may need to: Avoid doing things that: Cause pain or swelling. Cause you to make the same movements with your hand or thumb over and  over. Take medicines. These may include: Medicines to help with pain. Steroids to help with irritation and swelling. Wear a splint. Have surgery. This may be needed if other treatments don't work. Once the pain and swelling have gone down, you may start: Physical therapy. You may be given exercises to help with movement and strength in your wrist and thumb. Occupational therapy. This includes changing how you move your wrist. Follow these instructions at home: If you have a splint: Wear the splint as told. Take it off only if your provider says you can. Check the skin around it every day. Tell your provider if you see problems. Loosen the splint if your fingers tingle, are numb, or turn cold and blue. Keep the splint clean. If the splint isn't waterproof: Do not let it get wet. Cover it when you take a bath or shower. Use a cover that doesn't let any water in. Managing pain, stiffness, and swelling  Use ice or an ice pack as told. If you have a splint that you can take off, remove it only as told. Place a towel between your skin and the ice. Leave the ice on for 20 minutes, 2-3 times a day. If your skin turns red, take off the ice right away to prevent skin damage. The risk of damage is higher if you can't feel pain, heat, or cold. Move your fingers often to reduce stiffness and swelling. Raise your hand above the level of your heart while you're sitting or  lying down. Use pillows as needed. General instructions Take your medicines only as told. Ask if it's OK for you to lift. Ask when it's safe to drive if you have a splint on your hand. Ask what things are safe for you to do at home. Ask when you can go back to work or school. Where to find more information To learn more: Go to orthoinfo.aaos.org. Click "Search." Type "De Quervain's tenosynovitis" into the search bar. Contact a health care provider if: Your pain doesn't get better with medicine. Your pain gets worse. You get  new symptoms. This information is not intended to replace advice given to you by your health care provider. Make sure you discuss any questions you have with your health care provider. Document Revised: 07/27/2023 Document Reviewed: 07/27/2023 Elsevier Patient Education  2024 [employer].

## 2024-02-09 LAB — LIPID PANEL
Cholesterol, Total: 139 mg/dL (ref 100–199)
HDL: 48 mg/dL (ref >39)
LDL CALC COMMENT:: 2.9 ratio (ref 0.0–5.0)
LDL Chol Calc (NIH): 74 mg/dL (ref 0–99)
Triglycerides: 87 mg/dL (ref 0–149)
VLDL Cholesterol Cal: 17 mg/dL (ref 5–40)

## 2024-02-09 LAB — CMP14+EGFR
ALT: 22 IU/L (ref 0–44)
AST: 21 IU/L (ref 0–40)
Albumin: 4.7 g/dL (ref 3.8–4.9)
Alkaline Phosphatase: 137 IU/L — ABNORMAL HIGH (ref 44–121)
BUN/Creatinine Ratio: 17 (ref 9–20)
BUN: 18 mg/dL (ref 6–24)
Bilirubin Total: 0.3 mg/dL (ref 0.0–1.2)
CO2: 24 mmol/L (ref 20–29)
Calcium: 9.7 mg/dL (ref 8.7–10.2)
Chloride: 103 mmol/L (ref 96–106)
Creatinine, Ser: 1.09 mg/dL (ref 0.76–1.27)
Globulin, Total: 2.6 g/dL (ref 1.5–4.5)
Glucose: 105 mg/dL — ABNORMAL HIGH (ref 70–99)
Potassium: 3.8 mmol/L (ref 3.5–5.2)
Sodium: 143 mmol/L (ref 134–144)
Total Protein: 7.3 g/dL (ref 6.0–8.5)
eGFR: 78 mL/min/{1.73_m2} (ref >59)

## 2024-02-09 LAB — CBC
Hematocrit: 43.3 % (ref 37.5–51.0)
Hemoglobin: 13.9 g/dL (ref 13.0–17.7)
MCH: 28.7 pg (ref 26.6–33.0)
MCHC: 32.1 g/dL (ref 31.5–35.7)
MCV: 90 fL (ref 79–97)
Platelets: 270 10*3/uL (ref 150–450)
RBC: 4.84 x10E6/uL (ref 4.14–5.80)
RDW: 12.6 % (ref 11.6–15.4)
WBC: 7.4 10*3/uL (ref 3.4–10.8)

## 2024-02-09 LAB — MAGNESIUM: Magnesium: 2 mg/dL (ref 1.6–2.3)

## 2024-02-09 LAB — PSA: Prostate Specific Ag, Serum: 1 ng/mL (ref 0.0–4.0)

## 2024-02-09 LAB — TSH: TSH: 1.32 u[IU]/mL (ref 0.450–4.500)

## 2024-02-20 DIAGNOSIS — J9610 Chronic respiratory failure, unspecified whether with hypoxia or hypercapnia: Secondary | ICD-10-CM | POA: Diagnosis not present

## 2024-03-08 ENCOUNTER — Ambulatory Visit: Payer: Self-pay

## 2024-03-08 NOTE — Telephone Encounter (Signed)
 Copied from CRM [id number]. Topic: Clinical - Red Word Triage >> Mar 08, 2024  3:15 PM Turkey B wrote: [facility] that prompted transfer to Nurse Triage: pt called in, having pain in wrist and fingers   Chief Complaint: Wrist pain  Symptoms: Bilateral wrist pain  Frequency: Constant  Disposition: [] ED /[] Urgent Care (no appt availability in office) / [x] Appointment(In office/virtual)/ []  [facility]/ [] Home Care/ [] Refused Recommended Disposition /[] [facility]/ []  Follow-up with PCP Additional Notes: Patient calling in due to increased pain in his wrists. He states his pain is constant and is consistent with his history of arthritis. He denies any other symptoms or complaints. Appointment made for the patient tomorrow for evaluation and treatment of symptoms.     Reason for Disposition  [1] MODERATE pain (e.g., interferes with normal activities) AND [2] present > 3 days  Answer Assessment - Initial Assessment Questions 1. ONSET: "When did the pain start?"     Ongoing problem  2. LOCATION: "Where is the pain located?"     Bilateral wrists at thumb  3. PAIN: "How bad is the pain?" (Scale 1-10; or mild, moderate, severe)   - MILD (1-3): doesn't interfere with normal activities   - MODERATE (4-7): interferes with normal activities (e.g., work or school) or awakens from sleep   - SEVERE (8-10): excruciating pain, unable to use hand at all     Moderate to severe  4. WORK OR EXERCISE: "Has there been any recent work or exercise that involved this part (i.e., hand or wrist) of the body?"     No 5. CAUSE: "What do you think is causing the pain?"     Arthritis  6. AGGRAVATING FACTORS: "What makes the pain worse?" (e.g., using computer)     Using hand  7. OTHER SYMPTOMS: "Do you have any other symptoms?" (e.g., neck pain, swelling, rash, numbness, fever)     No  Protocols used: Hand and Wrist Pain-A-AH

## 2024-03-09 ENCOUNTER — Ambulatory Visit: Admitting: Family Medicine

## 2024-03-09 ENCOUNTER — Encounter: Payer: Self-pay | Admitting: Family Medicine

## 2024-03-09 VITALS — BP 120/72 | HR 89 | Temp 98.6°F | Ht 71.0 in | Wt 289.0 lb

## 2024-03-09 DIAGNOSIS — M654 Radial styloid tenosynovitis [de Quervain]: Secondary | ICD-10-CM | POA: Diagnosis not present

## 2024-03-09 MED ORDER — PREDNISONE 10 MG (21) PO TBPK: ORAL_TABLET | ORAL | 0 refills | Status: DC

## 2024-03-09 NOTE — Patient Instructions (Signed)
 Irritation and Swelling of the Thumb (De Quervain's Syndrome): What to Know  Tommi Rumps Quervain's syndrome causes irritation and swelling of the tendon on the thumb side of the wrist. Tendons are strong tissues that connect muscle to bone.  The tendons in the hand go through a tunnel called a sheath. A slippery layer of tissue helps the tendons move through the sheath. With de Quervain's syndrome, the sheath swells or thickens. This can cause pressure and pain. De Quervain's syndrome is also called de Quervain's disease or de Quervain's tenosynovitis. What are the causes? The exact cause isn't known. It may be from overuse of the hand and wrist. What increases the risk? You're more likely to get [employer] syndrome if: You use your hands far more than normal. This includes if you do certain movements over and over, such as twisting your hand or using a tight grip. You're pregnant. You're male and middle-aged. You have rheumatoid arthritis. You have diabetes. What are the signs or symptoms? The main symptom is pain on the thumb side of your wrist. The pain may get worse when you grasp something or turn your wrist. You may also have: Pain that goes up your forearm. Swelling of your wrist and hand. Trouble moving your thumb and wrist. A feeling of snapping in the wrist. A cyst. This is a pocket of fluid. How is this diagnosed? You may be diagnosed based on your symptoms, medical history, and an exam. The exam may include a Lourena Simmonds test. This is when your health care provider pulls your thumb and wrist to see if it causes pain. You may also have tests. These may include: An X-ray. An MRI. An ultrasound. How is this treated? You may need to: Avoid doing things that: Cause pain or swelling. Cause you to make the same movements with your hand or thumb over and over. Take medicines. These may include: Medicines to help with pain. Steroids to help with irritation and swelling. Wear a  splint. Have surgery. This may be needed if other treatments don't work. Once the pain and swelling have gone down, you may start: Physical therapy. You may be given exercises to help with movement and strength in your wrist and thumb. Occupational therapy. This includes changing how you move your wrist. Follow these instructions at home: If you have a splint: Wear the splint as told. Take it off only if your provider says you can. Check the skin around it every day. Tell your provider if you see problems. Loosen the splint if your fingers tingle, are numb, or turn cold and blue. Keep the splint clean. If the splint isn't waterproof: Do not let it get wet. Cover it when you take a bath or shower. Use a cover that doesn't let any water in. Managing pain, stiffness, and swelling  Use ice or an ice pack as told. If you have a splint that you can take off, remove it only as told. Place a towel between your skin and the ice. Leave the ice on for 20 minutes, 2-3 times a day. If your skin turns red, take off the ice right away to prevent skin damage. The risk of damage is higher if you can't feel pain, heat, or cold. Move your fingers often to reduce stiffness and swelling. Raise your hand above the level of your heart while you're sitting or lying down. Use pillows as needed. General instructions Take your medicines only as told. Ask if it's OK for you to lift. Ask  when it's safe to drive if you have a splint on your hand. Ask what things are safe for you to do at home. Ask when you can go back to work or school. Where to find more information To learn more: Go to orthoinfo.aaos.org. Click "Search." Type "De Quervain's tenosynovitis" into the search bar. Contact a health care provider if: Your pain doesn't get better with medicine. Your pain gets worse. You get new symptoms. This information is not intended to replace advice given to you by your health care provider. Make sure you  discuss any questions you have with your health care provider. Document Revised: 07/27/2023 Document Reviewed: 07/27/2023 Elsevier Patient Education  2024 [employer].

## 2024-03-09 NOTE — Progress Notes (Signed)
 Subjective: CC:Bilateral wrist pain PCP: Kyle Ip, DO ZOX:[id number] [name] is a 58 y.o. male presenting to clinic today for:  1.  Bilateral wrist pain Patient reports several month history of bilateral wrist pain.  He was treated with steroids a while back and felt maybe minimal improvement in symptoms.  When he was seen in February he was complaining of the right side specifically and we discussed use of topical Voltaren gel because he was not amenable to injection therapy at that time.  He returns today and notes that his mobility and that his hands are getting worse and that he even has difficulty picking up his adopted son.  He is right-hand dominant but utilizes both hands for computer work often.   ROS: Per HPI  Not on File Past Medical History:  Diagnosis Date   Acute respiratory failure with hypoxia (HCC) 11/26/2022   Arm DVT (deep venous thromboembolism), acute, right (HCC)    a. 12/2022 U/S R subclavian and axillary DVT (had PICC line during hosp in 11/2022).  Eliquis initiated.   Atrial fibrillation with rapid ventricular response (HCC) 11/26/2022   Back pain    Diastolic dysfunction    a. 04/2018 Echo: EF 60-65%; b. 11/2022 Echo: EF 70-75%, no rwma, Gr I DD, nl RV fxn, triv MR.   GERD (gastroesophageal reflux disease)    History of asthma    HTN (hypertension)    Hyperlipidemia    Incarcerated umbilical hernia    a. 11/2022 s/p repair. Hosp course complicated by post operative ileus, abdominal wall hematoma requiring evacuation, hypoxic respiratory failure requiring intubation, sepsis, and A-fib with RVR.   Mild intermittent asthma 04/02/2022   Onset in childhood ?  / played sports fine s needing saba  - 04/02/2022    symbicort 80 2bid   - 09/24/2022  After extensive coaching inhaler device,  effectiveness =   80% HFa> continue symbicort and max rx for gerd  - Allergy screen 09/24/2022 >  Eos 0.1 /  IgE 25   - FENO 12/30/2022  = 13 off symbicort  > rec leave off          Morbid obesity (HCC)    OSA (obstructive sleep apnea)    a. On BiPAP QHS.   PAF (paroxysmal atrial fibrillation) (HCC)    a. 11/2022 - developed in setting of SBO/VDRF->briefly required amio. CHA2DS2VASc = 1-->No OAC for Afib.   Small bowel obstruction (HCC) 11/26/2022    Current Outpatient Medications:    acetaminophen (TYLENOL) 325 MG tablet, Take 2 tablets (650 mg total) by mouth every 6 (six) hours as needed for mild pain, fever or headache., Disp: , Rfl:    albuterol (VENTOLIN HFA) 108 (90 Base) MCG/ACT inhaler, USE 2 PUFFS EVERY 6 HOURS AS NEEDED FOR WHEEZING (1 for home, 1 for work) (Patient taking differently: Inhale 2 puffs into the lungs every 6 (six) hours as needed for wheezing.), Disp: 36 g, Rfl: 4   apixaban (ELIQUIS) 5 MG TABS tablet, TAKE ONE (1) TABLET BY MOUTH TWO (2) TIMES DAILY, Disp: 180 tablet, Rfl: 0   atorvastatin (LIPITOR) 20 MG tablet, Take 1 tablet (20 mg total) by mouth daily. Change in dose (Patient taking differently: Take 40 mg by mouth daily. Change in dose), Disp: 90 tablet, Rfl: 3   budesonide-formoterol (SYMBICORT) 80-4.5 MCG/ACT inhaler, Inhale 2 puffs into the lungs 2 (two) times daily. (Patient taking differently: Inhale 2 puffs into the lungs daily as needed (wheezing).), Disp: 30.6 g, Rfl:  4   diclofenac Sodium (VOLTAREN) 1 % GEL, Apply 2 g topically 3 (three) times daily as needed (joint pain)., Disp: 200 g, Rfl: PRN   esomeprazole (NEXIUM) 40 MG capsule, Take 1 capsule (40 mg total) by mouth in the morning., Disp: 90 capsule, Rfl: 3   famotidine (PEPCID) 20 MG tablet, TAKE 1 TABLET BY MOUTH DAILY AFTER SUPPER, Disp: 30 tablet, Rfl: 11   FLUoxetine (PROZAC) 20 MG capsule, Take 1 capsule (20 mg total) by mouth daily., Disp: 90 capsule, Rfl: 3   fluticasone (FLONASE) 50 MCG/ACT nasal spray, Place 2 sprays into both nostrils daily. (Patient taking differently: Place 2 sprays into both nostrils in the morning.), Disp: 16 g, Rfl: 6   furosemide (LASIX) 20  MG tablet, TAKE ONE (1) TABLET BY MOUTH EVERY DAY, Disp: 30 tablet, Rfl: 3   levocetirizine (XYZAL) 5 MG tablet, Take 1 tablet (5 mg total) by mouth every evening., Disp: 90 tablet, Rfl: 3   montelukast (SINGULAIR) 10 MG tablet, TAKE ONE TABLET BY MOUTH AT BEDTIME, Disp: 90 tablet, Rfl: 1   Multiple Vitamins-Minerals (ONE-A-DAY MENS 50+) TABS, Take 1 tablet by mouth in the morning., Disp: , Rfl:    potassium chloride (KLOR-CON) 10 MEQ tablet, TAKE ONE (1) TABLET BY MOUTH EVERY DAY, Disp: 90 tablet, Rfl: 1   spironolactone (ALDACTONE) 25 MG tablet, Take 1 tablet (25 mg total) by mouth daily., Disp: 90 tablet, Rfl: 0 Social History   Socioeconomic History   Marital status: Single    Spouse name: Not on file   Number of children: 0   Years of education: Not on file   Highest education level: 12th grade  Occupational History   Not on file  Tobacco Use   Smoking status: Never   Smokeless tobacco: Current    Types: Chew    Last attempt to quit: 10/2019   Tobacco comments:    Uses chew "when he has a bad day"  Vaping Use   Vaping status: Never Used  Substance and Sexual Activity   Alcohol use: No    Comment: Socially   Drug use: No   Sexual activity: Not on file  Other Topics Concern   Not on file  Social History Narrative   Police officer   Currently taking care of his 39 year old nephew after the passing of his sister.   Social Drivers of Corporate investment banker Strain: Low Risk  (02/04/2024)   Overall Financial Resource Strain (CARDIA)    Difficulty of Paying Living Expenses: Not hard at all  Food Insecurity: No Food Insecurity (02/04/2024)   Hunger Vital Sign    Worried About Running Out of Food in the Last Year: Never true    Ran Out of Food in the Last Year: Never true  Transportation Needs: No Transportation Needs (02/04/2024)   PRAPARE - Administrator, Civil Service (Medical): No    Lack of Transportation (Non-Medical): No  Physical Activity: Inactive  (02/04/2024)   Exercise Vital Sign    Days of Exercise per Week: 1 day    Minutes of Exercise per Session: 0 min  Stress: No Stress Concern Present (02/04/2024)   [employer] of Occupational Health - Occupational Stress Questionnaire    Feeling of Stress : Only a little  Social Connections: Moderately Isolated (02/04/2024)   Social Connection and Isolation Panel [NHANES]    Frequency of Communication with Friends and Family: More than three times a week    Frequency  of Social Gatherings with Friends and Family: More than three times a week    Attends Religious Services: More than 4 times per year    Active Member of [employer] or Organizations: No    Attends Engineer, structural: Not on file    Marital Status: Never married  Intimate Partner Violence: Not At Risk (11/22/2022)   Humiliation, Afraid, Rape, and Kick questionnaire    Fear of Current or Ex-Partner: No    Emotionally Abused: No    Physically Abused: No    Sexually Abused: No   Family History  Problem Relation Age of Onset   Crohn's disease Mother    Arthritis Mother        back pain    Diabetes Mother    Asthma Father    Cancer Father        stomach   Arthritis Father    Breast cancer Sister    Hypertension Sister    Breast cancer Sister        stage 4   Multiple sclerosis Brother    Hypertension Maternal Grandmother    Asthma Maternal Grandfather    Diabetes Maternal Grandfather    Stroke Maternal Grandfather    Asthma Paternal Grandfather     Objective: Office vital signs reviewed. BP 120/72   Pulse 89   Temp 98.6 F (37 C)   Ht 5\' 11"  (1.803 m)   Wt 289 lb (131.1 kg)   SpO2 94%   BMI 40.31 kg/m   Physical Examination:  General: Awake, alert, well nourished, No acute distress MSK: Positive Finkelstein sign bilaterally.  No gross swelling but tenderness to palpation  Assessment/ Plan: 58 y.o. male   De Quervain's tenosynovitis, bilateral - Plan: predniSONE (STERAPRED UNI-PAK 21 TAB) 10  MG (21) TBPK tablet, CANCELED: DG Finger Thumb Left, CANCELED: DG Finger Thumb Right  Offered steroid injection into the area but he was quite reserved about this.  I am going to try another round of steroids but if this does not work you may need to return either here or to his orthopedist to have injection therapy to this area.  I have given him a handout with stretching exercises.  Encouraged use of ice.  Sadly not a candidate for NSAIDs due to chronic anticoagulation   Kyle Ip, DO [facility] [phone]

## 2024-03-22 DIAGNOSIS — J9610 Chronic respiratory failure, unspecified whether with hypoxia or hypercapnia: Secondary | ICD-10-CM | POA: Diagnosis not present

## 2024-03-31 ENCOUNTER — Other Ambulatory Visit: Payer: Self-pay | Admitting: Family Medicine

## 2024-03-31 DIAGNOSIS — F418 Other specified anxiety disorders: Secondary | ICD-10-CM

## 2024-03-31 DIAGNOSIS — K219 Gastro-esophageal reflux disease without esophagitis: Secondary | ICD-10-CM

## 2024-04-05 ENCOUNTER — Other Ambulatory Visit: Payer: Self-pay | Admitting: Family Medicine

## 2024-04-05 DIAGNOSIS — I82621 Acute embolism and thrombosis of deep veins of right upper extremity: Secondary | ICD-10-CM

## 2024-04-05 DIAGNOSIS — E782 Mixed hyperlipidemia: Secondary | ICD-10-CM

## 2024-04-05 DIAGNOSIS — J3089 Other allergic rhinitis: Secondary | ICD-10-CM

## 2024-04-05 DIAGNOSIS — I480 Paroxysmal atrial fibrillation: Secondary | ICD-10-CM

## 2024-04-21 DIAGNOSIS — J9610 Chronic respiratory failure, unspecified whether with hypoxia or hypercapnia: Secondary | ICD-10-CM | POA: Diagnosis not present

## 2024-05-10 NOTE — Progress Notes (Unsigned)
 Subjective: CC:*** PCP: Eliodoro Guerin, DO WUJ:[id number] Kyle Burns is a 58 y.o. male presenting to clinic today for:  1. ***   ROS: Per HPI  Not on File Past Medical History:  Diagnosis Date   Acute respiratory failure with hypoxia (HCC) 11/26/2022   Arm DVT (deep venous thromboembolism), acute, right (HCC)    a. 12/2022 U/S R subclavian and axillary DVT (had PICC line during hosp in 11/2022).  Eliquis  initiated.   Atrial fibrillation with rapid ventricular response (HCC) 11/26/2022   Back pain    Diastolic dysfunction    a. 04/2018 Echo: EF 60-65%; b. 11/2022 Echo: EF 70-75%, no rwma, Gr I DD, nl RV fxn, triv MR.   GERD (gastroesophageal reflux disease)    History of asthma    HTN (hypertension)    Hyperlipidemia    Incarcerated umbilical hernia    a. 11/2022 s/p repair. Hosp course complicated by post operative ileus, abdominal wall hematoma requiring evacuation, hypoxic respiratory failure requiring intubation, sepsis, and A-fib with RVR.   Mild intermittent asthma 04/02/2022   Onset in childhood ?  / played sports fine s needing saba  - 04/02/2022    symbicort  80 2bid   - 09/24/2022  After extensive coaching inhaler device,  effectiveness =   80% HFa> continue symbicort  and max rx for gerd  - Allergy screen 09/24/2022 >  Eos 0.1 /  IgE 25   - FENO 12/30/2022  = 13 off symbicort   > rec leave off         Morbid obesity (HCC)    OSA (obstructive sleep apnea)    a. On BiPAP QHS.   PAF (paroxysmal atrial fibrillation) (HCC)    a. 11/2022 - developed in setting of SBO/VDRF->briefly required amio. CHA2DS2VASc = 1-->No OAC for Afib.   Small bowel obstruction (HCC) 11/26/2022    Current Outpatient Medications:    acetaminophen  (TYLENOL ) 325 MG tablet, Take 2 tablets (650 mg total) by mouth every 6 (six) hours as needed for mild pain, fever or headache., Disp: , Rfl:    albuterol  (VENTOLIN  HFA) 108 (90 Base) MCG/ACT inhaler, USE 2 PUFFS EVERY 6 HOURS AS NEEDED FOR WHEEZING (1 for  home, 1 for work) (Patient taking differently: Inhale 2 puffs into the lungs every 6 (six) hours as needed for wheezing.), Disp: 36 g, Rfl: 4   atorvastatin  (LIPITOR) 20 MG tablet, TAKE ONE (1) TABLET BY MOUTH EVERY DAY, Disp: 90 tablet, Rfl: 0   budesonide -formoterol  (SYMBICORT ) 80-4.5 MCG/ACT inhaler, Inhale 2 puffs into the lungs 2 (two) times daily. (Patient taking differently: Inhale 2 puffs into the lungs daily as needed (wheezing).), Disp: 30.6 g, Rfl: 4   diclofenac  Sodium (VOLTAREN ) 1 % GEL, Apply 2 g topically 3 (three) times daily as needed (joint pain)., Disp: 200 g, Rfl: PRN   ELIQUIS  5 MG TABS tablet, TAKE ONE (1) TABLET BY MOUTH TWO (2) TIMES DAILY, Disp: 180 tablet, Rfl: 0   esomeprazole  (NEXIUM ) 40 MG capsule, TAKE 1 CAPSULE BY MOUTH EVERY MORNING, Disp: 90 capsule, Rfl: 0   famotidine  (PEPCID ) 20 MG tablet, TAKE 1 TABLET BY MOUTH DAILY AFTER SUPPER, Disp: 30 tablet, Rfl: 11   FLUoxetine  (PROZAC ) 20 MG capsule, TAKE ONE CAPSULE BY MOUTH DAILY, Disp: 90 capsule, Rfl: 0   fluticasone  (FLONASE ) 50 MCG/ACT nasal spray, Place 2 sprays into both nostrils daily. (Patient taking differently: Place 2 sprays into both nostrils in the morning.), Disp: 16 g, Rfl: 6   furosemide  (LASIX ) 20 MG  tablet, TAKE ONE (1) TABLET BY MOUTH EVERY DAY, Disp: 30 tablet, Rfl: 3   levocetirizine (XYZAL ) 5 MG tablet, TAKE 1 TABLET BY MOUTH EVERY EVENING, Disp: 90 tablet, Rfl: 3   montelukast  (SINGULAIR ) 10 MG tablet, TAKE ONE TABLET BY MOUTH AT BEDTIME, Disp: 90 tablet, Rfl: 1   Multiple Vitamins-Minerals (ONE-A-DAY MENS 50+) TABS, Take 1 tablet by mouth in the morning., Disp: , Rfl:    potassium chloride  (KLOR-CON ) 10 MEQ tablet, TAKE ONE (1) TABLET BY MOUTH EVERY DAY, Disp: 90 tablet, Rfl: 1   predniSONE  (STERAPRED UNI-PAK 21 TAB) 10 MG (21) TBPK tablet, As directed x 6 days, Disp: 21 tablet, Rfl: 0   spironolactone  (ALDACTONE ) 25 MG tablet, TAKE ONE (1) TABLET BY MOUTH EVERY DAY, Disp: 90 tablet, Rfl: 0 Social  History   Socioeconomic History   Marital status: Single    Spouse name: Not on file   Number of children: 0   Years of education: Not on file   Highest education level: 12th grade  Occupational History   Not on file  Tobacco Use   Smoking status: Never   Smokeless tobacco: Current    Types: Chew    Last attempt to quit: 10/2019   Tobacco comments:    Uses chew "when he has a bad day"  Vaping Use   Vaping status: Never Used  Substance and Sexual Activity   Alcohol use: No    Comment: Socially   Drug use: No   Sexual activity: Not on file  Other Topics Concern   Not on file  Social History Narrative   Police officer   Currently taking care of his 31 year old nephew after the passing of his sister.   Social Drivers of Corporate investment banker Strain: Low Risk  (02/04/2024)   Overall Financial Resource Strain (CARDIA)    Difficulty of Paying Living Expenses: Not hard at all  Food Insecurity: No Food Insecurity (02/04/2024)   Hunger Vital Sign    Worried About Running Out of Food in the Last Year: Never true    Ran Out of Food in the Last Year: Never true  Transportation Needs: No Transportation Needs (02/04/2024)   PRAPARE - Administrator, Civil Service (Medical): No    Lack of Transportation (Non-Medical): No  Physical Activity: Inactive (02/04/2024)   Exercise Vital Sign    Days of Exercise per Week: 1 day    Minutes of Exercise per Session: 0 min  Stress: No Stress Concern Present (02/04/2024)   [employer] of Occupational Health - Occupational Stress Questionnaire    Feeling of Stress : Only a little  Social Connections: Moderately Isolated (02/04/2024)   Social Connection and Isolation Panel [NHANES]    Frequency of Communication with Friends and Family: More than three times a week    Frequency of Social Gatherings with Friends and Family: More than three times a week    Attends Religious Services: More than 4 times per year    Active Member of  [employer] or Organizations: No    Attends Engineer, structural: Not on file    Marital Status: Never married  Intimate Partner Violence: Not At Risk (11/22/2022)   Humiliation, Afraid, Rape, and Kick questionnaire    Fear of Current or Ex-Partner: No    Emotionally Abused: No    Physically Abused: No    Sexually Abused: No   Family History  Problem Relation Age of Onset   Crohn's disease  Mother    Arthritis Mother        back pain    Diabetes Mother    Asthma Father    Cancer Father        stomach   Arthritis Father    Breast cancer Sister    Hypertension Sister    Breast cancer Sister        stage 4   Multiple sclerosis Brother    Hypertension Maternal Grandmother    Asthma Maternal Grandfather    Diabetes Maternal Grandfather    Stroke Maternal Grandfather    Asthma Paternal Grandfather     Objective: Office vital signs reviewed. There were no vitals taken for this visit.  Physical Examination:  General: Awake, alert, *** nourished, No acute distress HEENT: Normal    Neck: No masses palpated. No lymphadenopathy    Ears: Tympanic membranes intact, normal light reflex, no erythema, no bulging    Eyes: PERRLA, extraocular membranes intact, sclera ***    Nose: nasal turbinates moist, *** nasal discharge    Throat: moist mucus membranes, no erythema, *** tonsillar exudate.  Airway is patent Cardio: regular rate and rhythm, S1S2 heard, no murmurs appreciated Pulm: clear to auscultation bilaterally, no wheezes, rhonchi or rales; normal work of breathing on room air GI: soft, non-tender, non-distended, bowel sounds present x4, no hepatomegaly, no splenomegaly, no masses GU: external vaginal tissue ***, cervix ***, *** punctate lesions on cervix appreciated, *** discharge from cervical os, *** bleeding, *** cervical motion tenderness, *** abdominal/ adnexal masses Extremities: warm, well perfused, No edema, cyanosis or clubbing; +*** pulses bilaterally MSK: *** gait and  *** station Skin: dry; intact; no rashes or lesions Neuro: *** Strength and light touch sensation grossly intact, *** DTRs ***/4  Assessment/ Plan: 58 y.o. male   De Quervain's tenosynovitis, bilateral  ***   Kyle Burns Bambi Bonine, DO [facility] [phone]

## 2024-05-11 ENCOUNTER — Ambulatory Visit (INDEPENDENT_AMBULATORY_CARE_PROVIDER_SITE_OTHER): Payer: 59 | Admitting: Family Medicine

## 2024-05-11 ENCOUNTER — Encounter: Payer: Self-pay | Admitting: Family Medicine

## 2024-05-11 VITALS — BP 101/65 | HR 70 | Temp 98.7°F | Ht 71.0 in | Wt 290.0 lb

## 2024-05-11 DIAGNOSIS — Z6841 Body Mass Index (BMI) 40.0 and over, adult: Secondary | ICD-10-CM

## 2024-05-11 DIAGNOSIS — M654 Radial styloid tenosynovitis [de Quervain]: Secondary | ICD-10-CM

## 2024-05-11 DIAGNOSIS — Z713 Dietary counseling and surveillance: Secondary | ICD-10-CM | POA: Diagnosis not present

## 2024-05-11 DIAGNOSIS — G4733 Obstructive sleep apnea (adult) (pediatric): Secondary | ICD-10-CM

## 2024-05-11 DIAGNOSIS — E66813 Obesity, class 3: Secondary | ICD-10-CM

## 2024-05-11 NOTE — Patient Instructions (Signed)
 Let me know if you want to proceed with Zepbound and if your insurance will cover.

## 2024-05-12 DIAGNOSIS — N182 Chronic kidney disease, stage 2 (mild): Secondary | ICD-10-CM | POA: Diagnosis not present

## 2024-05-12 DIAGNOSIS — E785 Hyperlipidemia, unspecified: Secondary | ICD-10-CM | POA: Diagnosis not present

## 2024-05-12 DIAGNOSIS — K219 Gastro-esophageal reflux disease without esophagitis: Secondary | ICD-10-CM | POA: Diagnosis not present

## 2024-05-12 DIAGNOSIS — Z7951 Long term (current) use of inhaled steroids: Secondary | ICD-10-CM | POA: Diagnosis not present

## 2024-05-12 DIAGNOSIS — Z7901 Long term (current) use of anticoagulants: Secondary | ICD-10-CM | POA: Diagnosis not present

## 2024-05-12 DIAGNOSIS — Z8249 Family history of ischemic heart disease and other diseases of the circulatory system: Secondary | ICD-10-CM | POA: Diagnosis not present

## 2024-05-12 DIAGNOSIS — F419 Anxiety disorder, unspecified: Secondary | ICD-10-CM | POA: Diagnosis not present

## 2024-05-12 DIAGNOSIS — J309 Allergic rhinitis, unspecified: Secondary | ICD-10-CM | POA: Diagnosis not present

## 2024-05-12 DIAGNOSIS — E876 Hypokalemia: Secondary | ICD-10-CM | POA: Diagnosis not present

## 2024-05-12 DIAGNOSIS — F329 Major depressive disorder, single episode, unspecified: Secondary | ICD-10-CM | POA: Diagnosis not present

## 2024-05-12 DIAGNOSIS — J4489 Other specified chronic obstructive pulmonary disease: Secondary | ICD-10-CM | POA: Diagnosis not present

## 2024-05-18 ENCOUNTER — Encounter: Payer: Self-pay | Admitting: Family Medicine

## 2024-05-20 DIAGNOSIS — G4733 Obstructive sleep apnea (adult) (pediatric): Secondary | ICD-10-CM | POA: Diagnosis not present

## 2024-05-22 DIAGNOSIS — J9610 Chronic respiratory failure, unspecified whether with hypoxia or hypercapnia: Secondary | ICD-10-CM | POA: Diagnosis not present

## 2024-05-30 ENCOUNTER — Encounter: Payer: Self-pay | Admitting: Family Medicine

## 2024-06-03 ENCOUNTER — Other Ambulatory Visit: Payer: Self-pay | Admitting: Family Medicine

## 2024-06-03 ENCOUNTER — Other Ambulatory Visit: Payer: Self-pay | Admitting: Nurse Practitioner

## 2024-06-03 DIAGNOSIS — J3089 Other allergic rhinitis: Secondary | ICD-10-CM

## 2024-06-08 ENCOUNTER — Encounter: Payer: Self-pay | Admitting: Orthopedic Surgery

## 2024-06-08 ENCOUNTER — Ambulatory Visit: Admitting: Orthopedic Surgery

## 2024-06-08 VITALS — BP 134/84 | HR 74 | Ht 71.0 in | Wt 286.0 lb

## 2024-06-08 DIAGNOSIS — M654 Radial styloid tenosynovitis [de Quervain]: Secondary | ICD-10-CM | POA: Diagnosis not present

## 2024-06-08 NOTE — Patient Instructions (Signed)

## 2024-06-08 NOTE — Progress Notes (Signed)
 Return patient Visit  Assessment: Kyle Burns is a 58 y.o. male with the following: 1. De Quervain's tenosynovitis, bilateral  Plan: Kyle Burns has pain in both wrists.  This is consistent with de Quervain's tenosynovitis.  Left is worse than right.  I have offered him a steroid injection, and he elected to proceed.  We started with the left, and he did well, so he elected proceed with right wrist.  Medication as needed.  Recommend that he avoid use of the wristwatch, which may be putting pressure on this part of his wrist.  He states understanding.  He will follow-up as needed.  Procedure note injection Right 1st Dorsal Compartment (DeQuervain's)   Verbal consent was obtained to inject the right wrist  Timeout was completed to confirm the site of injection.  The skin was prepped with alcohol and ethyl chloride was sprayed at the injection site.  A 21-gauge needle was used to inject 40 mg of Depo-Medrol  and 1% lidocaine  (1 cc) into and around the 1st dorsal compartment  There were no complications. A sterile bandage was applied.   Procedure note injection left 1st Dorsal Compartment (DeQuervain's)   Verbal consent was obtained to inject the left wrist  Timeout was completed to confirm the site of injection.  The skin was prepped with alcohol and ethyl chloride was sprayed at the injection site.  A 21-gauge needle was used to inject 40 mg of Depo-Medrol  and 1% lidocaine  (1 cc) into and around the 1st dorsal compartment  There were no complications. A sterile bandage was applied.   Follow-up: Return if symptoms worsen or fail to improve.  Subjective:  Chief Complaint  Patient presents with   Wrist Pain    Bilat for a while but happening more often. Has a pulling ache along the thumb and tightness in the fingers. Pt has had 2 rounds of prednisone  with minor relief. Pt has tried splints as well that help some. Pt is left handed but pain started in the R    History of  Present Illness: Kyle Burns is a 58 y.o. male who has been referred by Rosina Fielding, DO for evaluation of bilateral wrist pain.  He has had pain in both wrists for several months.  Left worse than right.  He is left-hand dominant.  No specific injury.  With certain motions of the wrist, it is very painful.  He has tried braces.  He has had multiple courses of prednisone .  He has tried other medicines, as well as lidocaine  topical treatments.  No sustained relief.   Review of Systems: No fevers or chills No numbness or tingling No chest pain No shortness of breath No bowel or bladder dysfunction No GI distress No headaches   Objective: BP 134/84   Pulse 74   Ht 5' 11 (1.803 m)   Wt 286 lb (129.7 kg)   BMI 39.89 kg/m   Physical Exam:  General: Alert and oriented. and No acute distress. Gait: Normal gait.  Evaluation of bilateral wrist without deformity.  No redness.  No swelling.  Good grip strength bilaterally.  Sensation intact throughout both hands.  Positive Finkelstein's test bilaterally.  Tenderness palpation over the first dorsal compartment.  IMAGING: No new imaging obtained today   New Medications:  No orders of the defined types were placed in this encounter.     Oneil DELENA Horde, MD  06/08/2024 2:50 PM

## 2024-06-21 DIAGNOSIS — J9610 Chronic respiratory failure, unspecified whether with hypoxia or hypercapnia: Secondary | ICD-10-CM | POA: Diagnosis not present

## 2024-06-23 ENCOUNTER — Encounter: Payer: Self-pay | Admitting: Cardiology

## 2024-06-23 ENCOUNTER — Encounter: Payer: Self-pay | Admitting: Family Medicine

## 2024-06-23 ENCOUNTER — Ambulatory Visit: Attending: Cardiology | Admitting: Cardiology

## 2024-06-23 VITALS — BP 110/70 | HR 66 | Ht 71.0 in | Wt 284.0 lb

## 2024-06-23 DIAGNOSIS — G4733 Obstructive sleep apnea (adult) (pediatric): Secondary | ICD-10-CM | POA: Diagnosis not present

## 2024-06-23 DIAGNOSIS — I480 Paroxysmal atrial fibrillation: Secondary | ICD-10-CM

## 2024-06-23 DIAGNOSIS — E782 Mixed hyperlipidemia: Secondary | ICD-10-CM

## 2024-06-23 DIAGNOSIS — I5032 Chronic diastolic (congestive) heart failure: Secondary | ICD-10-CM | POA: Diagnosis not present

## 2024-06-23 NOTE — Patient Instructions (Signed)
 Medication Instructions:  Your physician recommends that you continue on your current medications as directed. Please refer to the Current Medication list given to you today.   Labwork: None today  Testing/Procedures: None today  Follow-Up: 6 months  Any Other Special Instructions Will Be Listed Below (If Applicable).  If you need a refill on your cardiac medications before your next appointment, please call your pharmacy.

## 2024-06-23 NOTE — Progress Notes (Signed)
    Cardiology Office Note  Date: 06/23/2024   ID: JHAMARI MARKOWICZ, DOB [birth year], MRN [id number]  History of Present Illness: Kyle Burns is a 58 y.o. male last seen in September 2024.  He is here today for a follow-up visit.  Reports no sense of recurring palpitations, no exertional chest pain.  No sudden dizziness or syncope.  We went over his medications.  He reports compliance with current regimen.  No spontaneous bleeding problems on Eliquis .  He states that he did talk with his PCP about considering Zepbound for weight loss.  I reviewed his interval lab work, LDL was down to 74 in February.  I reviewed his ECG which shows normal sinus rhythm with right bundle branch block.  Physical Exam: VS:  BP 110/70 (BP Location: Right Arm, Cuff Size: Large)   Pulse 66   Ht 5' 11 (1.803 m)   Wt 284 lb (128.8 kg)   SpO2 93%   BMI 39.61 kg/m , BMI Body mass index is 39.61 kg/m.  Wt Readings from Last 3 Encounters:  06/23/24 284 lb (128.8 kg)  06/08/24 286 lb (129.7 kg)  05/11/24 290 lb (131.5 kg)    General: Patient appears comfortable at rest. HEENT: Conjunctiva and lids normal. Neck: Supple, no elevated JVP or carotid bruits. Lungs: Clear to auscultation, nonlabored breathing at rest. Cardiac: Regular rate and rhythm, no S3 or significant systolic murmur, no pericardial rub. Extremities: No pitting edema.  ECG:  An ECG dated 02/03/2023 was personally reviewed today and demonstrated:  Sinus rhythm with right bundle branch block and left posterior fascicular block.  Labwork: 11/26/2023: BNP 6.0 02/08/2024: ALT 22; AST 21; BUN 18; Creatinine, Ser 1.09; Hemoglobin 13.9; Magnesium  2.0; Platelets 270; Potassium 3.8; Sodium 143; TSH 1.320     Component Value Date/Time   CHOL 139 02/08/2024 0847   TRIG 87 02/08/2024 0847   HDL 48 02/08/2024 0847   CHOLHDL 2.9 02/08/2024 0847   LDLCALC 74 02/08/2024 0847   Other Studies Reviewed Today:  No interval cardiac testing for review  today.  Assessment and Plan:  1.  Paroxysmal atrial fibrillation with CHA2DS2-VASc score of 1, diagnosis made in December 2023 in the setting of comorbid illness and hospital stay.  He has a history of intermittent palpitations at baseline suggesting recurring arrhythmia over time, and his likelihood of progressive arrhythmia is fairly high in the setting of OSA and diastolic dysfunction.  He remains symptomatically stable, ECG shows sinus rhythm.  Plan to continue Eliquis  5 mg twice daily for stroke prophylaxis at this point.  Currently not on any AV nodal blockers.   2.  HFpEF, LVEF 70 to 75% with mild diastolic dysfunction and normal RV contraction by echocardiogram in December 2023.  No progressive fluid retention.  He remains on Aldactone  25 mg daily and Lasix  20 mg daily with potassium supplement.   3.  Primary hypertension.  Blood pressure well-controlled today.  No changes made to current regimen.   4.  Mixed hyperlipidemia.  LDL down to 74 in February.  Continue Lipitor 20 mg daily.   5.  OSA on BiPAP.  Disposition:  Follow up 6 months.  Signed, Jayson JUDITHANN Sierras, M.D., F.A.C.C. [facility]

## 2024-07-01 ENCOUNTER — Other Ambulatory Visit: Payer: Self-pay | Admitting: Family Medicine

## 2024-07-01 DIAGNOSIS — F418 Other specified anxiety disorders: Secondary | ICD-10-CM

## 2024-07-01 DIAGNOSIS — E782 Mixed hyperlipidemia: Secondary | ICD-10-CM

## 2024-07-01 DIAGNOSIS — K219 Gastro-esophageal reflux disease without esophagitis: Secondary | ICD-10-CM

## 2024-07-02 ENCOUNTER — Other Ambulatory Visit: Payer: Self-pay | Admitting: Family Medicine

## 2024-07-02 DIAGNOSIS — I480 Paroxysmal atrial fibrillation: Secondary | ICD-10-CM

## 2024-07-02 DIAGNOSIS — I82621 Acute embolism and thrombosis of deep veins of right upper extremity: Secondary | ICD-10-CM

## 2024-07-07 NOTE — Progress Notes (Unsigned)
 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 Asthma as child no problem as adolescent  self referred to pulmonary clinic in [facility]  04/02/2022  with documented L HD immobility and MO by BMI c/b OSA on cpap     History of Present Illness  04/02/2022  Pulmonary/ 1st Burns eval/ Kyle Burns / Kyle Burns was maintained on symbicort  previously but off x over  a month s change in symptoms  Chief Complaint  Patient presents with   New Patient (Initial Visit)    Has seen Dr. McQuaid.  Feels asthma is going well.   Dyspnea:  not sure symbicort  really helps  Cough: none  SABA use: last used a month prior to OV   Rec Symbicort  80 can be used up to 2 puffs  first thing in am and 2 puff every every 12 hours and then wean off after a week Only use your albuterol  as a rescue medication Ok to try albuterol  15 min before an activity (on alternating days)  that you know would usually make you short of breath  Work on inhaler technique  05/22/22  Sood eval for osa > rx Bipap /02  09/24/2022  f/u ov/[name]/Kyle Burns re: asthma maint on symbicort  80 2bid   Chief Complaint  Patient presents with   Follow-up    Cough is still bothering patient. Dry cough most of the time but sometimes productive   Dyspnea:  ok flat surface / problems with hills  = MMRC1 = can walk nl pace, flat grade, can't hurry or go uphills or steps s sob   Cough: worse with speaking or deep breath x one month   Sleeping: on Bipap  / 02  x 4h  feels rested but not getting enough pressure  Supine/ 4 pillows  has gained 10 lbs since starting  SABA use: qod  02: none daytime/  2lpm hs  Covid status: all but new one  Rec Dr Shellia next available to troubleshoot your bipap problems Continue nexium  but take it 30-60 before 1st meal and add  after supper  Pepcid  20 mg  GERD diet Work on inhaler technique:   Try delsym  2 tsp twice daily  as needed for cough  Allergy screen 09/24/2022 >  Eos 0.1 /  IgE 25       11/14/2022  f/u ov/Kyle Burns re: doe/cough maint   off symbicort  x one week and cough maybe some better / convinced this  is pnds triggered  Chief Complaint  Patient presents with   Follow-up    Cough persistent  Dyspnea:  no change ex tol on vs off symbicort  Cough: mostly dry/ sense of pnds worse before supper and at bedime  Sleeping: not disturbing sleep / bed= flat waterbed / 2 pillows  SABA use: none  02: for bipap   Covid status:   vax x last one,? If ever infected Rec Depomedrol 120 mg IM  Stay on nexium  and pepcid  as you are  GERD diet reviewed, bed blocks rec  For drainage / throat tickle try take CHLORPHENIRAMINE  4 mg  Stop symicort and just use albuterol  as needed for now Please schedule a follow up Burns visit in 6 weeks, call sooner if needed with all medications /inhalers/ solutions in hand    Late add: if not improving > tessalon  200      01/20/2023  f/u ov/Kyle Burns re: cough x 08/2022    maint on singulair / nasal sprays and gerd rx only / no symbicort   or saba since last ov  Chief Complaint  Patient presents with   Follow-up    Cough improved.  Some PND  Dyspnea:  no problem chasing 53 y old  Cough: some pnds / no more purulent  Sleeping: on bipap per Sood  SABA use: none  02: 3lpm bipap  Covid status:   vax all but last  Rec If you start needing the albuterol  more than twice a week, restart the symbiciort  80  2 every 12 hours    07/09/2023  f/u ov/[name]/Kyle Burns re: cough since 08/2022  maint on singulair  and  prn albuterol   Chief Complaint  Patient presents with   Asthma  Dyspnea:  chasing 2 y around house  Cough: none  Sleeping: on cpap / 02  SABA use: not since last ov  02: just hs x 3lpm  Rec Weight control is a matter of calorie balance which needs to be tilted in your favor by eating less and exercising more No change in resp medications       07/08/2024  yearly f/u  ov/[name]/Kyle Burns re: MO/ intermittent asthma   maint on singulair   Chief Complaint  Patient presents with   Follow-up   Asthma   Dyspnea:  chasing 5 y / Not limited by breathing from desired activities   Cough: gone  Sleeping: on back water  bed/ 3 pillows on bipap and mouthguard has sense of not enough air but doesn't have hypersomnia / wakes up feeling refreshed  SABA use: none 02: 3lhs plus bipap per [place] NP      No obvious day to day or daytime variability or assoc excess/ purulent sputum or mucus plugs or hemoptysis or cp or chest tightness, subjective wheeze or overt sinus or hb symptoms.    Also denies any obvious fluctuation of symptoms with weather or environmental changes or other aggravating or alleviating factors except as outlined above   No unusual exposure hx or h/o childhood pna  or knowledge of premature birth.  Current Allergies, Complete Past Medical History, Past Surgical History, Family History, and Social History were reviewed in [name] record.  ROS  The following are not active complaints unless bolded Hoarseness, sore throat, dysphagia, dental problems, itching, sneezing,  nasal congestion or discharge of excess mucus or purulent secretions, ear ache,   fever, chills, sweats, unintended wt loss or wt gain, classically pleuritic or exertional cp,  orthopnea pnd or arm/hand swelling  or leg swelling, presyncope, palpitations, abdominal pain, anorexia, nausea, vomiting, diarrhea  or change in bowel habits or change in bladder habits, change in stools or change in urine, dysuria, hematuria,  rash, arthralgias, visual complaints, headache, numbness, weakness or ataxia or problems with walking or coordination,  change in mood or  memory.        Current Meds  Medication Sig   acetaminophen  (TYLENOL ) 325 MG tablet Take 2 tablets (650 mg total) by mouth every 6 (six) hours as needed for mild pain, fever or headache.   atorvastatin   (LIPITOR) 20 MG tablet TAKE ONE (1) TABLET BY MOUTH EVERY DAY   Coenzyme Q10 (COQ10 PO) Take by mouth.   Cyanocobalamin (VITAMIN B 12) 100 MCG LOZG Take by mouth.   diclofenac  Sodium (VOLTAREN ) 1 % GEL Apply 2 g topically 3 (three) times daily as needed (joint pain).   ELIQUIS  5 MG TABS  tablet TAKE ONE (1) TABLET BY MOUTH TWO (2) TIMES DAILY   esomeprazole  (NEXIUM ) 40 MG capsule TAKE 1 CAPSULE BY MOUTH EVERY MORNING   famotidine  (PEPCID ) 20 MG tablet TAKE 1 TABLET BY MOUTH DAILY AFTER SUPPER   Flaxseed, Linseed, (FLAXSEED OIL) 1400 MG CAPS Take 1,400 mg by mouth daily.   FLUoxetine  (PROZAC ) 20 MG capsule TAKE ONE CAPSULE BY MOUTH DAILY   fluticasone  (FLONASE ) 50 MCG/ACT nasal spray Place 2 sprays into both nostrils daily.   furosemide  (LASIX ) 20 MG tablet TAKE ONE (1) TABLET BY MOUTH EVERY DAY   levocetirizine (XYZAL ) 5 MG tablet TAKE 1 TABLET BY MOUTH EVERY EVENING   Misc Natural Products (TURMERIC, CURCUMIN, PO) Take 1,000 mg by mouth daily.   montelukast  (SINGULAIR ) 10 MG tablet TAKE ONE TABLET BY MOUTH AT BEDTIME   Multiple Vitamins-Minerals (ONE-A-DAY MENS 50+) TABS Take 1 tablet by mouth in the morning.   potassium chloride  (KLOR-CON ) 10 MEQ tablet TAKE ONE (1) TABLET BY MOUTH EVERY DAY   Saw Palmetto 450 MG CAPS Take 450 mg by mouth daily.   spironolactone  (ALDACTONE ) 25 MG tablet TAKE ONE (1) TABLET BY MOUTH EVERY DAY             Past Medical History:  Diagnosis Date   Asthma    hx of    Back pain    Bilevel positive airway pressure (BPAP) dependence    GERD (gastroesophageal reflux disease)    HTN (hypertension)    Hyperlipidemia         Objective:    Wts  07/08/2024      285   07/09/2023      277  01/20/2023        276  12/30/2022      270 11/14/2022      295   09/24/22 295 lb (133.8 kg)  09/12/22 293 lb (132.9 kg)  09/01/22 300 lb (136.1 kg)     Vital signs reviewed  07/08/2024  - Note at rest 02 sats  97% on RA   General appearance:    Mod obese (by bmi) amb bm  nad   HEENT : Oropharynx  clear      Nasal turbinates nl    NECK :  without  apparent JVD/ palpable Nodes/TM    LUNGS: no acc muscle use,  Nl contour chest which is clear to A and P bilaterally without cough on insp or exp maneuvers   CV:  RRR  no s3 or murmur or increase in P2, and no edema   ABD:  quite obese soft and nontender   MS:  Gait nl  ext warm without deformities Or obvious joint restrictions  calf tenderness, cyanosis or clubbing    SKIN: warm and dry without lesions    NEURO:  alert, approp, nl sensorium with  no motor or cerebellar deficits apparent.               Assessment

## 2024-07-08 ENCOUNTER — Ambulatory Visit: Admitting: Internal Medicine

## 2024-07-08 ENCOUNTER — Encounter: Payer: Self-pay | Admitting: Internal Medicine

## 2024-07-08 VITALS — BP 124/81 | HR 73 | Ht 71.0 in | Wt 285.8 lb

## 2024-07-08 DIAGNOSIS — Z6841 Body Mass Index (BMI) 40.0 and over, adult: Secondary | ICD-10-CM

## 2024-07-08 DIAGNOSIS — E66813 Obesity, class 3: Secondary | ICD-10-CM | POA: Diagnosis not present

## 2024-07-08 DIAGNOSIS — J4520 Mild intermittent asthma, uncomplicated: Secondary | ICD-10-CM

## 2024-07-08 DIAGNOSIS — J4489 Other specified chronic obstructive pulmonary disease: Secondary | ICD-10-CM

## 2024-07-08 DIAGNOSIS — G4733 Obstructive sleep apnea (adult) (pediatric): Secondary | ICD-10-CM

## 2024-07-08 DIAGNOSIS — J069 Acute upper respiratory infection, unspecified: Secondary | ICD-10-CM | POA: Diagnosis not present

## 2024-07-08 MED ORDER — BUDESONIDE-FORMOTEROL FUMARATE 80-4.5 MCG/ACT IN AERO: 2.0000 | INHALATION_SPRAY | Freq: Two times a day (BID) | RESPIRATORY_TRACT | 4 refills | Status: AC

## 2024-07-08 MED ORDER — ALBUTEROL SULFATE HFA 108 (90 BASE) MCG/ACT IN AERS: INHALATION_SPRAY | RESPIRATORY_TRACT | 4 refills | Status: AC

## 2024-07-08 NOTE — Assessment & Plan Note (Signed)
 He is not hypersomnolent but has sensation machine is not working well   >>> refer to Almarie Ferrari NP/ sleep medicine

## 2024-07-08 NOTE — Assessment & Plan Note (Addendum)
 He has gained wt back and is not borderline MO again   Body mass index is 39.86 kg/m.  -  trending up Lab Results  Component Value Date   TSH 1.320 02/08/2024    Contributing to doe OSA and risk of GERD/dvt/PE  >>>   reviewed the need and the process to achieve and maintain neg calorie balance > defer f/u primary care including intermittently monitoring thyroid  status             Each maintenance medication was reviewed in detail including emphasizing most importantly the difference between maintenance and prns and under what circumstances the prns are to be triggered using an action plan format where appropriate.  Total time for H and P, chart review, counseling, reviewing hfa device(s) and generating customized AVS unique to this office visit / same day charting = 34 min summary final regular pulmonary f/u.

## 2024-07-08 NOTE — Patient Instructions (Addendum)
 To get the most out of exercise, you need to be continuously aware that you are short of breath, but never out of breath, for at least 30 minutes daily. As you improve, it will actually be easier for you to do the same amount of exercise  in  30 minutes so always push to the level where you are short of breath.      Pulmonary clinic follow up is as needed   I will send your chart out your chart to Landry Ferrari NP to see where you stand with hour BIPAP needs

## 2024-07-08 NOTE — Assessment & Plan Note (Addendum)
 Never smoker - Onset in childhood ?  / played sports fine s needing saba - 04/02/2022    symbicort  80 2bid  - 09/24/2022  After extensive coaching inhaler device,  effectiveness =   80% HFa> continue symbicort  and max rx for gerd - Allergy screen 09/24/2022 >  Eos 0.1 /  IgE 25  - FENO 12/30/2022  = 13 off symbicort   > rec leave off > no flares off symbicort  as of 07/08/2024 and just on singulair  10 mg daily  so pulmonary f/u is prn

## 2024-07-12 NOTE — Procedures (Signed)
Mask fit

## 2024-07-17 ENCOUNTER — Other Ambulatory Visit: Payer: Self-pay | Admitting: Cardiology

## 2024-07-22 ENCOUNTER — Telehealth: Payer: Self-pay

## 2024-07-22 DIAGNOSIS — J9610 Chronic respiratory failure, unspecified whether with hypoxia or hypercapnia: Secondary | ICD-10-CM | POA: Diagnosis not present

## 2024-07-22 NOTE — Telephone Encounter (Signed)
-----   Message from Almarie LELON Ferrari sent at 07/11/2024  1:29 PM EDT ----- Sabra can we call DME and ask if they have a compliance report for Mr [place] If not compliance report please order PSG with titration ----- Message ----- From: Darlean Ozell NOVAK, MD Sent: 07/09/2024   8:14 AM EDT To: Almarie LELON Ferrari, NP  He said he did take the machine in and  never heard from us  so this is a big loose end common in the 02 and cpap bizzness in my experience ----- Message ----- From: Ferrari Almarie LELON, NP Sent: 07/08/2024   1:11 PM EDT To: Ozell NOVAK Darlean, MD; Sabra ONEIDA Leeds, CMA  Hey Dr. Darlean, thanks for the message. When I saw him he was using a loaner BIPAP but did not feel if was sufficient. Attempted to get compliance off SD card but unable. Advised he bring BIPAP machine to medical supply store for download. We can order PSG with titration If he is needing a new machine and we dont have a compliance reports.   Hiroki Wint can you call him and ask if he was bale to bring CPAP machine to DME, if not help provide him contact and ask that they fax me report ----- Message ----- From: Darlean Ozell NOVAK, MD Sent: 07/08/2024  11:41 AM EDT To: Almarie LELON Ferrari, NP  I can't follow where the plan got off track but he says he did the equipment in for a download but never heard anything back  I don't plan to follow him at all as he is doing fine on minimal needed for albuterol

## 2024-07-22 NOTE — Telephone Encounter (Signed)
 Unable to find pt in [facility]. ATC Brad with [employer] but could not reach. LVM for Brad to call me back.

## 2024-08-04 ENCOUNTER — Other Ambulatory Visit: Payer: Self-pay | Admitting: Family Medicine

## 2024-08-04 DIAGNOSIS — E782 Mixed hyperlipidemia: Secondary | ICD-10-CM

## 2024-08-23 ENCOUNTER — Encounter: Payer: Self-pay | Admitting: Family Medicine

## 2024-08-23 ENCOUNTER — Ambulatory Visit: Admitting: Family Medicine

## 2024-08-23 VITALS — BP 129/78 | HR 95 | Temp 98.1°F | Ht 71.0 in | Wt 286.1 lb

## 2024-08-23 DIAGNOSIS — J329 Chronic sinusitis, unspecified: Secondary | ICD-10-CM

## 2024-08-23 DIAGNOSIS — Z23 Encounter for immunization: Secondary | ICD-10-CM

## 2024-08-23 MED ORDER — AZELASTINE HCL 0.1 % NA SOLN: 1.0000 | Freq: Two times a day (BID) | NASAL | 12 refills | Status: AC | PRN

## 2024-08-23 NOTE — Progress Notes (Signed)
 Subjective: RR:Kyle Burns up PCP: Jolinda Norene HERO, DO YEP:[id number] E Parada is a 58 y.o. male presenting to clinic today for:  Discussed the use of AI scribe software for clinical note transcription with the patient, who gave verbal consent to proceed.  History of Present Illness   Kyle Burns is a 58 year old male who presents with sinus congestion and allergy symptoms.  He has been experiencing sinus congestion and symptoms he describes as 'sinus stuff,' which he believes might be related to allergies or the weather change. No fevers, but he describes a scratchy, irritated feeling in his throat and mentions some drainage, although he is unsure if it is significant.  He is currently using Flonase , Singulair , and Xyzal  to manage his symptoms and recently refilled his Flonase  prescription. He does not perform sinus rinses, stating 'I just don't do it.'  He recently switched insurance providers due to changes in coverage in [place]  and is now with a different [place]  Health plan.         ROS: Per HPI  No Known Allergies Past Medical History:  Diagnosis Date   Acute respiratory failure with hypoxia (HCC) 11/26/2022   Arm DVT (deep venous thromboembolism), acute, right (HCC)    a. 12/2022 U/S R subclavian and axillary DVT (had PICC line during hosp in 11/2022).  Eliquis  initiated.   Atrial fibrillation with rapid ventricular response (HCC) 11/26/2022   Back pain    Diastolic dysfunction    a. 04/2018 Echo: EF 60-65%; b. 11/2022 Echo: EF 70-75%, no rwma, Gr I DD, nl RV fxn, triv MR.   GERD (gastroesophageal reflux disease)    History of asthma    HTN (hypertension)    Hyperlipidemia    Incarcerated umbilical hernia    a. 11/2022 s/p repair. Hosp course complicated by post operative ileus, abdominal wall hematoma requiring evacuation, hypoxic respiratory failure requiring intubation, sepsis, and A-fib with RVR.   Mild intermittent asthma 04/02/2022   Onset in childhood  ?  / played sports fine s needing saba  - 04/02/2022    symbicort  80 2bid   - 09/24/2022  After extensive coaching inhaler device,  effectiveness =   80% HFa> continue symbicort  and max rx for gerd  - Allergy screen 09/24/2022 >  Eos 0.1 /  IgE 25   - FENO 12/30/2022  = 13 off symbicort   > rec leave off         Morbid obesity (HCC)    OSA (obstructive sleep apnea)    a. On BiPAP QHS.   PAF (paroxysmal atrial fibrillation) (HCC)    a. 11/2022 - developed in setting of SBO/VDRF->briefly required amio. CHA2DS2VASc = 1-->No OAC for Afib.   Small bowel obstruction (HCC) 11/26/2022    Current Outpatient Medications:    acetaminophen  (TYLENOL ) 325 MG tablet, Take 2 tablets (650 mg total) by mouth every 6 (six) hours as needed for mild pain, fever or headache., Disp: , Rfl:    albuterol  (VENTOLIN  HFA) 108 (90 Base) MCG/ACT inhaler, USE 2 PUFFS EVERY 6 HOURS AS NEEDED FOR WHEEZING (1 for home, 1 for work), Disp: 36 g, Rfl: 4   atorvastatin  (LIPITOR) 20 MG tablet, TAKE ONE (1) TABLET BY MOUTH EVERY DAY, Disp: 90 tablet, Rfl: 0   azelastine  (ASTELIN ) 0.1 % nasal spray, Place 1 spray into both nostrils 2 (two) times daily as needed for rhinitis (runny nose)., Disp: 30 mL, Rfl: 12   budesonide -formoterol  (SYMBICORT ) 80-4.5 MCG/ACT inhaler, Inhale 2 puffs into  the lungs 2 (two) times daily., Disp: 30.6 g, Rfl: 4   Coenzyme Q10 (COQ10 PO), Take by mouth., Disp: , Rfl:    Cyanocobalamin (VITAMIN B 12) 100 MCG LOZG, Take by mouth., Disp: , Rfl:    diclofenac  Sodium (VOLTAREN ) 1 % GEL, Apply 2 g topically 3 (three) times daily as needed (joint pain)., Disp: 200 g, Rfl: PRN   ELIQUIS  5 MG TABS tablet, TAKE ONE (1) TABLET BY MOUTH TWO (2) TIMES DAILY, Disp: 180 tablet, Rfl: 0   esomeprazole  (NEXIUM ) 40 MG capsule, TAKE 1 CAPSULE BY MOUTH EVERY MORNING, Disp: 90 capsule, Rfl: 0   famotidine  (PEPCID ) 20 MG tablet, TAKE 1 TABLET BY MOUTH DAILY AFTER SUPPER, Disp: 30 tablet, Rfl: 11   Flaxseed, Linseed, (FLAXSEED OIL)  1400 MG CAPS, Take 1,400 mg by mouth daily., Disp: , Rfl:    FLUoxetine  (PROZAC ) 20 MG capsule, TAKE ONE CAPSULE BY MOUTH DAILY, Disp: 90 capsule, Rfl: 0   fluticasone  (FLONASE ) 50 MCG/ACT nasal spray, Place 2 sprays into both nostrils daily., Disp: 16 g, Rfl: 6   furosemide  (LASIX ) 20 MG tablet, TAKE ONE (1) TABLET BY MOUTH EVERY DAY, Disp: 30 tablet, Rfl: 3   levocetirizine (XYZAL ) 5 MG tablet, TAKE 1 TABLET BY MOUTH EVERY EVENING, Disp: 90 tablet, Rfl: 3   Misc Natural Products (TURMERIC, CURCUMIN, PO), Take 1,000 mg by mouth daily., Disp: , Rfl:    montelukast  (SINGULAIR ) 10 MG tablet, TAKE ONE TABLET BY MOUTH AT BEDTIME, Disp: 90 tablet, Rfl: 1   Multiple Vitamins-Minerals (ONE-A-DAY MENS 50+) TABS, Take 1 tablet by mouth in the morning., Disp: , Rfl:    potassium chloride  (KLOR-CON ) 10 MEQ tablet, TAKE ONE (1) TABLET BY MOUTH EVERY DAY, Disp: 90 tablet, Rfl: 2   Saw Palmetto 450 MG CAPS, Take 450 mg by mouth daily., Disp: , Rfl:    spironolactone  (ALDACTONE ) 25 MG tablet, TAKE ONE (1) TABLET BY MOUTH EVERY DAY, Disp: 90 tablet, Rfl: 0 Social History   Socioeconomic History   Marital status: Single    Spouse name: Not on file   Number of children: 0   Years of education: Not on file   Highest education level: Some college, no degree  Occupational History   Not on file  Tobacco Use   Smoking status: Never   Smokeless tobacco: Current    Types: Chew    Last attempt to quit: 10/2019   Tobacco comments:    Uses chew when he has a bad day  Vaping Use   Vaping status: Never Used  Substance and Sexual Activity   Alcohol use: Not Currently    Comment: Socially   Drug use: No   Sexual activity: Not on file  Other Topics Concern   Not on file  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   Social Drivers of Corporate investment banker Strain: Low Risk  (08/19/2024)   Overall Financial Resource Strain  (CARDIA)    Difficulty of Paying Living Expenses: Not hard at all  Food Insecurity: No Food Insecurity (08/19/2024)   Hunger Vital Sign    Worried About Running Out of Food in the Last Year: Never true    Ran Out of Food in the Last Year: Never true  Transportation Needs: No Transportation Needs (08/19/2024)   PRAPARE - Transportation    Lack of Transportation (Medical): No    Lack of  Transportation (Non-Medical): No  Physical Activity: Insufficiently Active (08/19/2024)   Exercise Vital Sign    Days of Exercise per Week: 1 day    Minutes of Exercise per Session: 10 min  Stress: No Stress Concern Present (08/19/2024)   [employer] of Occupational Health - Occupational Stress Questionnaire    Feeling of Stress: Not at all  Social Connections: Moderately Integrated (08/19/2024)   Social Connection and Isolation Panel    Frequency of Communication with Friends and Family: More than three times a week    Frequency of Social Gatherings with Friends and Family: Once a week    Attends Religious Services: More than 4 times per year    Active Member of [employer] or Organizations: Yes    Attends Banker Meetings: 1 to 4 times per year    Marital Status: Never married  Intimate Partner Violence: Not At Risk (11/22/2022)   Humiliation, Afraid, Rape, and Kick questionnaire    Fear of Current or Ex-Partner: No    Emotionally Abused: No    Physically Abused: No    Sexually Abused: No   Family History  Problem Relation Age of Onset   Crohn's disease Mother    Arthritis Mother        back pain    Diabetes Mother    Asthma Father    Cancer Father        stomach   Arthritis Father    Breast cancer Sister    Hypertension Sister    Breast cancer Sister        stage 4   Multiple sclerosis Brother    Hypertension Maternal Grandmother    Asthma Maternal Grandfather    Diabetes Maternal Grandfather    Stroke Maternal Grandfather    Asthma Paternal Grandfather     Objective: Office  vital signs reviewed. BP 129/78   Pulse 95   Temp 98.1 F (36.7 C)   Ht 5' 11 (1.803 m)   Wt 286 lb 2 oz (129.8 kg)   SpO2 94%   BMI 39.91 kg/m   Physical Examination:  General: Awake, alert, well nourished, No acute distress HEENT: Normal    Neck: No masses palpated. No lymphadenopathy    Ears: Tympanic membranes intact, normal light reflex, no erythema, no bulging    Eyes: PERRLA, extraocular membranes intact, sclera white    Nose: nasal turbinates moist but mildly erythematous, clear nasal discharge    Throat: moist mucus membranes, mild cobblestone appearance of the oropharynx, no tonsillar exudate.  Airway is patent Cardio: regular rate and rhythm, S1S2 heard, no murmurs appreciated Pulm: clear to auscultation bilaterally, no wheezes, rhonchi or rales; normal work of breathing on room air    Assessment/ Plan: 58 y.o. male   Rhinosinusitis - Plan: azelastine  (ASTELIN ) 0.1 % nasal spray  Encounter for immunization - Plan: Flu vaccine trivalent PF, 6mos and older(Flulaval,Afluria,Fluarix,Fluzone)  Assessment and Plan    Allergic rhinitis Allergic rhinitis with nasal congestion and drainage, likely exacerbated by weather changes. No infection signs. Current treatment includes Flonase , Singulair , and Xyzal . Difficulty with sinus rinses noted. - Prescribed Astelin  nasal spray for additional drainage control. - Continue Flonase  for congestion management.      Norene CHRISTELLA Fielding, DO [facility] [phone]

## 2024-09-04 ENCOUNTER — Encounter: Payer: Self-pay | Admitting: Family Medicine

## 2024-09-13 ENCOUNTER — Other Ambulatory Visit: Payer: Self-pay | Admitting: Internal Medicine

## 2024-09-13 ENCOUNTER — Ambulatory Visit: Admitting: Family Medicine

## 2024-09-23 ENCOUNTER — Telehealth: Payer: Self-pay | Admitting: Family Medicine

## 2024-09-23 NOTE — Telephone Encounter (Signed)
 I called pt to let him know that he has samples of Eliquis   in the cabinet. He will try to get by here on Monday.

## 2024-09-24 ENCOUNTER — Other Ambulatory Visit: Payer: Self-pay | Admitting: Family Medicine

## 2024-09-24 DIAGNOSIS — K219 Gastro-esophageal reflux disease without esophagitis: Secondary | ICD-10-CM

## 2024-09-24 DIAGNOSIS — F418 Other specified anxiety disorders: Secondary | ICD-10-CM

## 2024-09-26 NOTE — Telephone Encounter (Signed)
 30 days sent in - pt ntbs for chronic care

## 2024-09-26 NOTE — Telephone Encounter (Signed)
 Appt scheduled for 10/25/2024

## 2024-10-05 ENCOUNTER — Encounter: Payer: Self-pay | Admitting: Family Medicine

## 2024-10-08 ENCOUNTER — Other Ambulatory Visit: Payer: Self-pay | Admitting: Family Medicine

## 2024-10-17 ENCOUNTER — Other Ambulatory Visit: Payer: Self-pay | Admitting: Internal Medicine

## 2024-10-17 NOTE — Telephone Encounter (Signed)
 Pt is requesting refill for Pepcid . Last seen 7/25 and nothing listed mentions med. Please advise

## 2024-10-25 ENCOUNTER — Ambulatory Visit: Payer: Self-pay | Admitting: Family Medicine

## 2024-10-26 ENCOUNTER — Other Ambulatory Visit: Payer: Self-pay | Admitting: *Deleted

## 2024-10-26 DIAGNOSIS — F418 Other specified anxiety disorders: Secondary | ICD-10-CM

## 2024-10-26 DIAGNOSIS — K219 Gastro-esophageal reflux disease without esophagitis: Secondary | ICD-10-CM

## 2024-10-26 DIAGNOSIS — I82621 Acute embolism and thrombosis of deep veins of right upper extremity: Secondary | ICD-10-CM

## 2024-10-26 DIAGNOSIS — E782 Mixed hyperlipidemia: Secondary | ICD-10-CM

## 2024-10-26 DIAGNOSIS — I480 Paroxysmal atrial fibrillation: Secondary | ICD-10-CM

## 2024-11-02 ENCOUNTER — Encounter: Payer: Self-pay | Admitting: Family Medicine

## 2024-11-02 ENCOUNTER — Ambulatory Visit (INDEPENDENT_AMBULATORY_CARE_PROVIDER_SITE_OTHER): Payer: Self-pay | Admitting: Family Medicine

## 2024-11-02 VITALS — BP 131/84 | HR 75 | Temp 97.9°F | Ht 71.0 in | Wt 284.5 lb

## 2024-11-02 DIAGNOSIS — I10 Essential (primary) hypertension: Secondary | ICD-10-CM

## 2024-11-02 DIAGNOSIS — E66812 Obesity, class 2: Secondary | ICD-10-CM

## 2024-11-02 DIAGNOSIS — G4733 Obstructive sleep apnea (adult) (pediatric): Secondary | ICD-10-CM | POA: Diagnosis not present

## 2024-11-02 DIAGNOSIS — E782 Mixed hyperlipidemia: Secondary | ICD-10-CM

## 2024-11-02 DIAGNOSIS — Z713 Dietary counseling and surveillance: Secondary | ICD-10-CM

## 2024-11-02 DIAGNOSIS — I480 Paroxysmal atrial fibrillation: Secondary | ICD-10-CM

## 2024-11-02 DIAGNOSIS — Z6839 Body mass index (BMI) 39.0-39.9, adult: Secondary | ICD-10-CM

## 2024-11-02 NOTE — Progress Notes (Signed)
 Subjective: Kyle Burns loss counseling PCP: Jolinda Kyle HERO, DO YEP:Qmjwxopw Kyle Burns is a 58 y.o. male presenting to clinic today for:  Morbid obesity associated with OSA on Bipap/HTN/ HLD/ Afib We discussed this several months ago but he forgot to check in with his insurance company as to which GLP or GIP might be covered.  He is compliant with his BiPAP and mouthguard.  He does not reporting chest pain, shortness of breath.  Compliant with cardiac medications for atrial fibrillation, hypertension and hyperlipidemia.  Continues to stay active with his nephew, whom he is the legal guardian of.  He has tried diet modification and increased physical exercise but has not been successful at sustaining the changes in the past.   ROS: Per HPI  No Known Allergies Past Medical History:  Diagnosis Date   Acute respiratory failure with hypoxia (HCC) 11/26/2022   Arm DVT (deep venous thromboembolism), acute, right (HCC)    a. 12/2022 U/S R subclavian and axillary DVT (had PICC line during hosp in 11/2022).  Eliquis  initiated.   Atrial fibrillation with rapid ventricular response (HCC) 11/26/2022   Back pain    Diastolic dysfunction    a. 04/2018 Echo: EF 60-65%; b. 11/2022 Echo: EF 70-75%, no rwma, Gr I DD, nl RV fxn, triv MR.   GERD (gastroesophageal reflux disease)    History of asthma    HTN (hypertension)    Hyperlipidemia    Incarcerated umbilical hernia    a. 11/2022 s/p repair. Hosp course complicated by post operative ileus, abdominal wall hematoma requiring evacuation, hypoxic respiratory failure requiring intubation, sepsis, and A-fib with RVR.   Mild intermittent asthma 04/02/2022   Onset in childhood ?  / played sports fine s needing saba  - 04/02/2022    symbicort  80 2bid   - 09/24/2022  After extensive coaching inhaler device,  effectiveness =   80% HFa> continue symbicort  and max rx for gerd  - Allergy screen 09/24/2022 >  Eos 0.1 /  IgE 25   - FENO 12/30/2022  = 13 off symbicort    > rec leave off         Morbid obesity (HCC)    OSA (obstructive sleep apnea)    a. On BiPAP QHS.   PAF (paroxysmal atrial fibrillation) (HCC)    a. 11/2022 - developed in setting of SBO/VDRF->briefly required amio. CHA2DS2VASc = 1-->No OAC for Afib.   Small bowel obstruction (HCC) 11/26/2022    Current Outpatient Medications:    acetaminophen  (TYLENOL ) 325 MG tablet, Take 2 tablets (650 mg total) by mouth every 6 (six) hours as needed for mild pain, fever or headache., Disp: , Rfl:    albuterol  (VENTOLIN  HFA) 108 (90 Base) MCG/ACT inhaler, USE 2 PUFFS EVERY 6 HOURS AS NEEDED FOR WHEEZING (1 for home, 1 for work), Disp: 36 g, Rfl: 4   atorvastatin  (LIPITOR) 20 MG tablet, TAKE ONE (1) TABLET BY MOUTH EVERY DAY, Disp: 90 tablet, Rfl: 0   azelastine  (ASTELIN ) 0.1 % nasal spray, Place 1 spray into both nostrils 2 (two) times daily as needed for rhinitis (runny nose)., Disp: 30 mL, Rfl: 12   budesonide -formoterol  (SYMBICORT ) 80-4.5 MCG/ACT inhaler, Inhale 2 puffs into the lungs 2 (two) times daily., Disp: 30.6 g, Rfl: 4   Coenzyme Q10 (COQ10 PO), Take by mouth., Disp: , Rfl:    Cyanocobalamin (VITAMIN B 12) 100 MCG LOZG, Take by mouth., Disp: , Rfl:    diclofenac  Sodium (VOLTAREN ) 1 % GEL, Apply 2 g topically 3 (  three) times daily as needed (joint pain)., Disp: 200 g, Rfl: PRN   ELIQUIS  5 MG TABS tablet, TAKE ONE (1) TABLET BY MOUTH TWO (2) TIMES DAILY, Disp: 180 tablet, Rfl: 0   esomeprazole  (NEXIUM ) 40 MG capsule, TAKE 1 CAPSULE BY MOUTH EVERY MORNING, Disp: 30 capsule, Rfl: 0   famotidine  (PEPCID ) 20 MG tablet, TAKE 1 TABLET BY MOUTH DAILY AFTER SUPPER, Disp: 30 tablet, Rfl: 0   Flaxseed, Linseed, (FLAXSEED OIL) 1400 MG CAPS, Take 1,400 mg by mouth daily., Disp: , Rfl:    FLUoxetine  (PROZAC ) 20 MG capsule, TAKE ONE CAPSULE BY MOUTH DAILY, Disp: 30 capsule, Rfl: 0   fluticasone  (FLONASE ) 50 MCG/ACT nasal spray, Place 2 sprays into both nostrils daily., Disp: 16 g, Rfl: 6   furosemide  (LASIX ) 20  MG tablet, TAKE ONE (1) TABLET BY MOUTH EVERY DAY, Disp: 30 tablet, Rfl: 0   levocetirizine (XYZAL ) 5 MG tablet, TAKE 1 TABLET BY MOUTH EVERY EVENING, Disp: 90 tablet, Rfl: 3   Misc Natural Products (TURMERIC, CURCUMIN, PO), Take 1,000 mg by mouth daily., Disp: , Rfl:    montelukast  (SINGULAIR ) 10 MG tablet, TAKE ONE TABLET BY MOUTH AT BEDTIME, Disp: 90 tablet, Rfl: 1   Multiple Vitamins-Minerals (ONE-A-DAY MENS 50+) TABS, Take 1 tablet by mouth in the morning., Disp: , Rfl:    potassium chloride  (KLOR-CON ) 10 MEQ tablet, TAKE ONE (1) TABLET BY MOUTH EVERY DAY, Disp: 90 tablet, Rfl: 2   Saw Palmetto 450 MG CAPS, Take 450 mg by mouth daily., Disp: , Rfl:    spironolactone  (ALDACTONE ) 25 MG tablet, TAKE ONE (1) TABLET BY MOUTH EVERY DAY, Disp: 30 tablet, Rfl: 0 Social History   Socioeconomic History   Marital status: Single    Spouse name: Not on file   Number of children: 0   Years of education: Not on file   Highest education level: 12th grade  Occupational History   Not on file  Tobacco Use   Smoking status: Never   Smokeless tobacco: Current    Types: Chew    Last attempt to quit: 10/2019   Tobacco comments:    Uses chew when he has a bad day  Vaping Use   Vaping status: Never Used  Substance and Sexual Activity   Alcohol use: Not Currently    Comment: Socially   Drug use: No   Sexual activity: Not on file  Other Topics Concern   Not on file  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   Social Drivers of Corporate Investment Banker Strain: Low Risk  (10/21/2024)   Overall Financial Resource Strain (CARDIA)    Difficulty of Paying Living Expenses: Not very hard  Food Insecurity: No Food Insecurity (10/21/2024)   Hunger Vital Sign    Worried About Running Out of Food in the Last Year: Never true    Ran Out of Food in the Last Year: Never true  Transportation Needs: No Transportation Needs  (10/21/2024)   PRAPARE - Administrator, Civil Service (Medical): No    Lack of Transportation (Non-Medical): No  Physical Activity: Insufficiently Active (10/21/2024)   Exercise Vital Sign    Days of Exercise per Week: 1 day    Minutes of Exercise per Session: 10 min  Stress: No Stress Concern Present (10/21/2024)   [employer] of Occupational Health - Occupational Stress Questionnaire    Feeling  of Stress: Not at all  Social Connections: Moderately Isolated (10/21/2024)   Social Connection and Isolation Panel    Frequency of Communication with Friends and Family: More than three times a week    Frequency of Social Gatherings with Friends and Family: More than three times a week    Attends Religious Services: More than 4 times per year    Active Member of [employer] or Organizations: No    Attends Engineer, Structural: Not on file    Marital Status: Never married  Intimate Partner Violence: Not At Risk (11/22/2022)   Humiliation, Afraid, Rape, and Kick questionnaire    Fear of Current or Ex-Partner: No    Emotionally Abused: No    Physically Abused: No    Sexually Abused: No   Family History  Problem Relation Age of Onset   Crohn's disease Mother    Arthritis Mother        back pain    Diabetes Mother    Asthma Father    Cancer Father        stomach   Arthritis Father    Breast cancer Sister    Hypertension Sister    Breast cancer Sister        stage 4   Multiple sclerosis Brother    Hypertension Maternal Grandmother    Asthma Maternal Grandfather    Diabetes Maternal Grandfather    Stroke Maternal Grandfather    Asthma Paternal Grandfather     Objective: Office vital signs reviewed. BP 131/84   Pulse 75   Temp 97.9 F (36.6 C)   Ht 5' 11 (1.803 m)   Wt 284 lb 8 oz (129 kg)   SpO2 93%   BMI 39.68 kg/m   Physical Examination:  General: Awake, alert, well nourished, morbidly obese male.  No acute distress HEENT: sclera white, MMM GI:  Obese, protuberant abdomen  Sleep study: 04/2018   Assessment/ Plan: 58 y.o. male   Class 2 severe obesity due to excess calories with serious comorbidity and body mass index (BMI) of 39.0 to 39.9 in adult  OSA treated with BiPAP  Essential hypertension with goal blood pressure less than 130/80  Mixed hyperlipidemia  Paroxysmal A-fib (HCC)  Weight loss counseling, encounter for   Hopefully we can get Zepbound given his OSA treated with BiPAP.  However, we discussed direct to consumer options which Kyle Burns is slightly cheaper to pursue.  He will also check and see if Qsymia is covered.  He will contact me today and I am glad to place orders accordingly.  Patient's BMI is >30 mg/m2.  Patient's current BMI is Body mass index is 39.68 kg/m.Kyle Burns  Patient is currently enrolled in a healthy eating plan along with encouraged exercise.  Patient has contraindications to phentermine, Contrave & Qsymia (contains phentermine).  Patient does not have a personal or family history of medullary thyroid  carcinoma (MTC) or Multiple Endocrine Neoplasia syndrome type 2 (MEN 2).   Kyle CHRISTELLA Fielding, DO [facility] [phone]

## 2024-11-06 ENCOUNTER — Other Ambulatory Visit: Payer: Self-pay | Admitting: Family Medicine

## 2024-11-07 ENCOUNTER — Encounter: Payer: Self-pay | Admitting: Family Medicine

## 2024-11-18 ENCOUNTER — Other Ambulatory Visit: Payer: Self-pay | Admitting: Internal Medicine

## 2024-11-25 ENCOUNTER — Other Ambulatory Visit: Payer: Self-pay | Admitting: Family Medicine

## 2024-11-25 DIAGNOSIS — K219 Gastro-esophageal reflux disease without esophagitis: Secondary | ICD-10-CM

## 2024-11-25 DIAGNOSIS — F418 Other specified anxiety disorders: Secondary | ICD-10-CM

## 2024-11-28 ENCOUNTER — Other Ambulatory Visit: Payer: Self-pay | Admitting: Family Medicine

## 2024-11-28 DIAGNOSIS — J3089 Other allergic rhinitis: Secondary | ICD-10-CM

## 2024-12-21 ENCOUNTER — Encounter: Payer: Self-pay | Admitting: Family Medicine

## 2024-12-24 ENCOUNTER — Other Ambulatory Visit: Payer: Self-pay | Admitting: Family Medicine

## 2024-12-24 DIAGNOSIS — I480 Paroxysmal atrial fibrillation: Secondary | ICD-10-CM

## 2024-12-24 DIAGNOSIS — I82621 Acute embolism and thrombosis of deep veins of right upper extremity: Secondary | ICD-10-CM

## 2024-12-26 ENCOUNTER — Encounter: Payer: Self-pay | Admitting: Family Medicine

## 2024-12-26 DIAGNOSIS — J0100 Acute maxillary sinusitis, unspecified: Secondary | ICD-10-CM | POA: Diagnosis not present

## 2024-12-26 MED ORDER — AMOXICILLIN-POT CLAVULANATE 875-125 MG PO TABS: 1.0000 | ORAL_TABLET | Freq: Two times a day (BID) | ORAL | 0 refills | Status: DC

## 2024-12-26 NOTE — Telephone Encounter (Signed)

## 2024-12-28 ENCOUNTER — Encounter: Payer: Self-pay | Admitting: Cardiology

## 2024-12-28 ENCOUNTER — Ambulatory Visit: Payer: Self-pay | Attending: Cardiology | Admitting: Cardiology

## 2024-12-28 ENCOUNTER — Ambulatory Visit: Payer: Self-pay | Admitting: Cardiology

## 2024-12-28 VITALS — BP 128/84 | HR 80 | Ht 71.0 in | Wt 289.6 lb

## 2024-12-28 DIAGNOSIS — I5032 Chronic diastolic (congestive) heart failure: Secondary | ICD-10-CM | POA: Diagnosis not present

## 2024-12-28 DIAGNOSIS — I480 Paroxysmal atrial fibrillation: Secondary | ICD-10-CM

## 2024-12-28 DIAGNOSIS — I10 Essential (primary) hypertension: Secondary | ICD-10-CM

## 2024-12-28 DIAGNOSIS — E782 Mixed hyperlipidemia: Secondary | ICD-10-CM | POA: Diagnosis not present

## 2024-12-28 NOTE — Patient Instructions (Signed)
 Medication Instructions:   Your physician recommends that you continue on your current medications as directed. Please refer to the Current Medication list given to you today.   Labwork: None today  Testing/Procedures: None today  Follow-Up: 6 months Dr.McDowell  Any Other Special Instructions Will Be Listed Below (If Applicable).  If you need a refill on your cardiac medications before your next appointment, please call your pharmacy.

## 2024-12-28 NOTE — Progress Notes (Signed)
"  ° ° °  Cardiology Office Note  Date: 12/28/2024   ID: Kyle Burns, DOB Apr 24, 1966, MRN [id number]  History of Present Illness: Kyle Burns is a 60 y.o. male last seen in July 2025.  He is here for a routine visit.  Reports no progressive sense of palpitations, no exertional chest pain or unusual shortness of breath with typical activities.  He has had no dizziness or syncope.  We went over his medications today.  He remains on Eliquis  5 mg twice daily and does not report any spontaneous bleeding problems.  Denies any obvious fluid retention on current diuretic regimen.  He is due for follow-up lab work with PCP.  Physical Exam: VS:  BP 128/84 (BP Location: Right Arm, Cuff Size: Large)   Pulse 80   Ht 5' 11 (1.803 m)   Wt 289 lb 9.6 oz (131.4 kg)   SpO2 94%   BMI 40.39 kg/m , BMI Body mass index is 40.39 kg/m.  Wt Readings from Last 3 Encounters:  12/28/24 289 lb 9.6 oz (131.4 kg)  11/02/24 284 lb 8 oz (129 kg)  08/23/24 286 lb 2 oz (129.8 kg)    General: Patient appears comfortable at rest. HEENT: Conjunctiva and lids normal. Neck: Supple, no elevated JVP or carotid bruits. Lungs: Clear to auscultation, nonlabored breathing at rest. Cardiac: Regular rate and rhythm, no S3 or significant systolic murmur.  ECG:  An ECG dated 06/23/2024 was personally reviewed today and demonstrated:  Sinus rhythm with right bundle branch block.  Labwork: 02/08/2024: ALT 22; AST 21; BUN 18; Creatinine, Ser 1.09; Hemoglobin 13.9; Magnesium  2.0; Platelets 270; Potassium 3.8; Sodium 143; TSH 1.320     Component Value Date/Time   CHOL 139 02/08/2024 0847   TRIG 87 02/08/2024 0847   HDL 48 02/08/2024 0847   CHOLHDL 2.9 02/08/2024 0847   LDLCALC 74 02/08/2024 0847   Other Studies Reviewed Today:  No interval cardiac testing for review today.  Assessment and Plan:  1.  Paroxysmal atrial fibrillation with CHA2DS2-VASc score of 1, diagnosis made in December 2023 in the setting of comorbid  illness and hospital stay.  He has a history of intermittent palpitations at baseline suggesting recurring arrhythmia over time.  At this point remains symptomatically stable, plan to continue Eliquis  5 mg twice daily for stroke prophylaxis.  He is due for follow-up lab work with PCP.   2.  HFpEF, LVEF 70 to 75% with mild diastolic dysfunction and normal RV contraction by echocardiogram in December 2023.  No fluid retention on current regimen including Aldactone  25 mg daily, and Lasix  20 mg daily with potassium supplement.   3.  Primary hypertension.  Blood pressure is adequately controlled today, no changes made to current regimen.   4.  Mixed hyperlipidemia.  LDL down to 74 in February 2025.  Continue Lipitor 20 mg daily.   5.  OSA on BiPAP.  Disposition:  Follow up 6 months.  Signed, Jayson JUDITHANN Sierras, M.D., F.A.C.C. [facility] "

## 2025-01-11 ENCOUNTER — Ambulatory Visit: Admitting: Family Medicine

## 2025-01-11 ENCOUNTER — Encounter: Payer: Self-pay | Admitting: Family Medicine

## 2025-01-11 VITALS — BP 127/77 | HR 87 | Temp 97.7°F | Ht 71.0 in | Wt 294.0 lb

## 2025-01-11 DIAGNOSIS — J208 Acute bronchitis due to other specified organisms: Secondary | ICD-10-CM

## 2025-01-11 DIAGNOSIS — B9689 Other specified bacterial agents as the cause of diseases classified elsewhere: Secondary | ICD-10-CM | POA: Diagnosis not present

## 2025-01-11 LAB — RSV AG, IMMUNOCHR, WAIVED: RSV Ag, Immunochr, Waived: NEGATIVE

## 2025-01-11 LAB — VERITOR SARS-COV-2 AND FLU A+B
BD Veritor SARS-CoV-2 Ag: NEGATIVE
Influenza A: NEGATIVE
Influenza B: NEGATIVE

## 2025-01-11 LAB — RAPID STREP SCREEN (MED CTR MEBANE ONLY): Strep Gp A Ag, IA W/Reflex: NEGATIVE

## 2025-01-11 LAB — CULTURE, GROUP A STREP

## 2025-01-11 MED ORDER — AZITHROMYCIN 250 MG PO TABS: ORAL_TABLET | ORAL | 0 refills | Status: AC

## 2025-01-11 MED ORDER — GUAIFENESIN-CODEINE 100-10 MG/5ML PO SOLN: 5.0000 mL | Freq: Four times a day (QID) | ORAL | 0 refills | Status: AC | PRN

## 2025-01-11 MED ORDER — BENZONATATE 200 MG PO CAPS: 200.0000 mg | ORAL_CAPSULE | Freq: Two times a day (BID) | ORAL | 0 refills | Status: AC | PRN

## 2025-01-11 NOTE — Progress Notes (Signed)
 "  Subjective: CC: Cough PCP: Jolinda Kyle HERO, DO YEP:Qmjwxopw Kyle Burns is a 59 y.o. male presenting to clinic today for:  Patient reports that he completed the antibiotic that was prescribed on 12/26/2024.  He was well for a little bit and then suddenly became sick again last week.  He reports cough, sore throat, congestion.  He also reports some left-sided hip pain since he has been sick.  Reports resolution in nausea, diarrhea.  Has had no hemoptysis.  No fevers.  No wheezing.   ROS: Per HPI  Allergies[1] Past Medical History:  Diagnosis Date   Acute respiratory failure with hypoxia (HCC) 11/26/2022   Arm DVT (deep venous thromboembolism), acute, right (HCC)    a. 12/2022 U/S R subclavian and axillary DVT (had PICC line during hosp in 11/2022).  Eliquis  initiated.   Atrial fibrillation with rapid ventricular response (HCC) 11/26/2022   Back pain    Diastolic dysfunction    a. 04/2018 Echo: EF 60-65%; b. 11/2022 Echo: EF 70-75%, no rwma, Gr I DD, nl RV fxn, triv MR.   GERD (gastroesophageal reflux disease)    History of asthma    HTN (hypertension)    Hyperlipidemia    Incarcerated umbilical hernia    a. 11/2022 s/p repair. Hosp course complicated by post operative ileus, abdominal wall hematoma requiring evacuation, hypoxic respiratory failure requiring intubation, sepsis, and A-fib with RVR.   Mild intermittent asthma 04/02/2022   Onset in childhood ?  / played sports fine s needing saba  - 04/02/2022    symbicort  80 2bid   - 09/24/2022  After extensive coaching inhaler device,  effectiveness =   80% HFa> continue symbicort  and max rx for gerd  - Allergy screen 09/24/2022 >  Eos 0.1 /  IgE 25   - FENO 12/30/2022  = 13 off symbicort   > rec leave off         Morbid obesity (HCC)    OSA (obstructive sleep apnea)    a. On BiPAP QHS.   PAF (paroxysmal atrial fibrillation) (HCC)    a. 11/2022 - developed in setting of SBO/VDRF->briefly required amio. CHA2DS2VASc = 1-->No OAC for Afib.    Small bowel obstruction (HCC) 11/26/2022   Current Medications[2] Social History   Socioeconomic History   Marital status: Single    Spouse name: Not on file   Number of children: 0   Years of education: Not on file   Highest education level: 12th grade  Occupational History   Not on file  Tobacco Use   Smoking status: Never   Smokeless tobacco: Former    Types: Chew    Quit date: 10/2019   Tobacco comments:    Uses chew when he has a bad day  Vaping Use   Vaping status: Never Used  Substance and Sexual Activity   Alcohol use: Not Currently    Comment: Socially   Drug use: No   Sexual activity: Not on file  Other Topics Concern   Not on file  Social History Narrative   Police officer   Currently taking care of his 29 year old nephew after the passing of his sister.   Social Drivers of Health   Tobacco Use: Medium Risk (12/28/2024)   Patient History    Smoking Tobacco Use: Never    Smokeless Tobacco Use: Former    Passive Exposure: Not on Actuary Strain: Low Risk (10/21/2024)   Overall Financial Resource Strain (CARDIA)    Difficulty of  Paying Living Expenses: Not very hard  Food Insecurity: No Food Insecurity (10/21/2024)   Epic    Worried About Programme Researcher, Broadcasting/film/video in the Last Year: Never true    Ran Out of Food in the Last Year: Never true  Transportation Needs: No Transportation Needs (10/21/2024)   Epic    Lack of Transportation (Medical): No    Lack of Transportation (Non-Medical): No  Physical Activity: Insufficiently Active (10/21/2024)   Exercise Vital Sign    Days of Exercise per Week: 1 day    Minutes of Exercise per Session: 10 min  Stress: No Stress Concern Present (10/21/2024)   [employer] of Occupational Health - Occupational Stress Questionnaire    Feeling of Stress: Not at all  Social Connections: Moderately Isolated (10/21/2024)   Social Connection and Isolation Panel    Frequency of Communication with Friends and Family:  More than three times a week    Frequency of Social Gatherings with Friends and Family: More than three times a week    Attends Religious Services: More than 4 times per year    Active Member of [employer] or Organizations: No    Attends Banker Meetings: Not on file    Marital Status: Never married  Intimate Partner Violence: Not At Risk (11/22/2022)   Humiliation, Afraid, Rape, and Kick questionnaire    Fear of Current or Ex-Partner: No    Emotionally Abused: No    Physically Abused: No    Sexually Abused: No  Depression (PHQ2-9): Low Risk (11/02/2024)   Depression (PHQ2-9)    PHQ-2 Score: 1  Alcohol Screen: Low Risk (10/21/2024)   Alcohol Screen    Last Alcohol Screening Score (AUDIT): 0  Housing: Low Risk (10/21/2024)   Epic    Unable to Pay for Housing in the Last Year: No    Number of Times Moved in the Last Year: 0    Homeless in the Last Year: No  Utilities: Not At Risk (11/22/2022)   AHC Utilities    Threatened with loss of utilities: No  Health Literacy: Not on file   Family History  Problem Relation Age of Onset   Crohn's disease Mother    Arthritis Mother        back pain    Diabetes Mother    Asthma Father    Cancer Father        stomach   Arthritis Father    Breast cancer Sister    Hypertension Sister    Breast cancer Sister        stage 4   Multiple sclerosis Brother    Hypertension Maternal Grandmother    Asthma Maternal Grandfather    Diabetes Maternal Grandfather    Stroke Maternal Grandfather    Asthma Paternal Grandfather     Objective: Office vital signs reviewed. BP 127/77   Pulse 87   Temp 97.7 F (36.5 C)   Ht 5' 11 (1.803 m)   Wt 294 lb (133.4 kg)   SpO2 95%   BMI 41.00 kg/m   Physical Examination:  General: Awake, alert, well nourished, No acute distress HEENT: Normal    Neck: No masses palpated. No lymphadenopathy    Ears: Tympanic membranes intact, normal light reflex, no erythema, no bulging    Eyes: PERRLA,  extraocular membranes intact, sclera white    Nose: nasal turbinates moist, clear nasal discharge    Throat: moist mucus membranes, no erythema, no tonsillar exudate.  Airway is patent Cardio: regular rate  and rhythm, S1S2 heard, no murmurs appreciated Pulm: Wheezes appreciated at the right lower lung base.  Otherwise clear to auscultation with normal work of breathing.  No dyspnea with speech or exertion appreciated  Assessment/ Plan: 59 y.o. male   Acute bacterial bronchitis - Plan: Veritor SARS-CoV-2 and Flu A+B, RSV Ag, Immunochr, Waived, Rapid Strep Screen ([facility] ONLY), azithromycin  (ZITHROMAX ) 250 MG tablet, benzonatate  (TESSALON ) 200 MG capsule, guaiFENesin -codeine  100-10 MG/5ML syrup   Exam notable for right lower lung wheezing concerning for possible infiltrates.  Z-Pak prescribed.  Cough medication sent.  Caution sedation with syrup.  Home care instructions reviewed and reasons for reevaluation discussed.  Follow-up as needed  Also rapid viral testing and strep testing negative   Kyle CHRISTELLA Fielding, DO [facility] [phone]     [1] No Known Allergies [2]  Current Outpatient Medications:    acetaminophen  (TYLENOL ) 325 MG tablet, Take 2 tablets (650 mg total) by mouth every 6 (six) hours as needed for mild pain, fever or headache., Disp: , Rfl:    albuterol  (VENTOLIN  HFA) 108 (90 Base) MCG/ACT inhaler, USE 2 PUFFS EVERY 6 HOURS AS NEEDED FOR WHEEZING (1 for home, 1 for work), Disp: 36 g, Rfl: 4   amoxicillin -clavulanate (AUGMENTIN ) 875-125 MG tablet, Take 1 tablet by mouth 2 (two) times daily., Disp: 20 tablet, Rfl: 0   atorvastatin  (LIPITOR) 20 MG tablet, TAKE ONE (1) TABLET BY MOUTH EVERY DAY, Disp: 90 tablet, Rfl: 0   azelastine  (ASTELIN ) 0.1 % nasal spray, Place 1 spray into both nostrils 2 (two) times daily as needed for rhinitis (runny nose)., Disp: 30 mL, Rfl: 12   budesonide -formoterol  (SYMBICORT ) 80-4.5 MCG/ACT inhaler, Inhale 2  puffs into the lungs 2 (two) times daily., Disp: 30.6 g, Rfl: 4   Coenzyme Q10 (COQ10 PO), Take by mouth., Disp: , Rfl:    Cyanocobalamin (VITAMIN B 12) 100 MCG LOZG, Take by mouth., Disp: , Rfl:    diclofenac  Sodium (VOLTAREN ) 1 % GEL, Apply 2 g topically 3 (three) times daily as needed (joint pain). (Patient not taking: Reported on 12/28/2024), Disp: 200 g, Rfl: PRN   ELIQUIS  5 MG TABS tablet, TAKE ONE (1) TABLET BY MOUTH TWO (2) TIMES DAILY, Disp: 180 tablet, Rfl: 0   esomeprazole  (NEXIUM ) 40 MG capsule, TAKE 1 CAPSULE BY MOUTH EVERY MORNING, Disp: 30 capsule, Rfl: 2   famotidine  (PEPCID ) 20 MG tablet, TAKE 1 TABLET BY MOUTH DAILY AFTER SUPPER, Disp: 30 tablet, Rfl: 0   Flaxseed, Linseed, (FLAXSEED OIL) 1400 MG CAPS, Take 1,400 mg by mouth daily., Disp: , Rfl:    FLUoxetine  (PROZAC ) 20 MG capsule, TAKE ONE CAPSULE BY MOUTH DAILY, Disp: 30 capsule, Rfl: 2   fluticasone  (FLONASE ) 50 MCG/ACT nasal spray, Place 2 sprays into both nostrils daily., Disp: 16 g, Rfl: 6   furosemide  (LASIX ) 20 MG tablet, TAKE ONE (1) TABLET BY MOUTH EVERY DAY, Disp: 30 tablet, Rfl: 5   levocetirizine (XYZAL ) 5 MG tablet, TAKE 1 TABLET BY MOUTH EVERY EVENING, Disp: 90 tablet, Rfl: 3   Misc Natural Products (TURMERIC, CURCUMIN, PO), Take 1,000 mg by mouth daily., Disp: , Rfl:    montelukast  (SINGULAIR ) 10 MG tablet, TAKE ONE TABLET BY MOUTH AT BEDTIME, Disp: 90 tablet, Rfl: 1   Multiple Vitamins-Minerals (ONE-A-DAY MENS 50+) TABS, Take 1 tablet by mouth in the morning., Disp: , Rfl:    potassium chloride  (KLOR-CON ) 10 MEQ tablet, TAKE ONE (1) TABLET BY MOUTH EVERY DAY, Disp: 90 tablet, Rfl:  2   Saw Palmetto 450 MG CAPS, Take 450 mg by mouth daily. (Patient not taking: Reported on 12/28/2024), Disp: , Rfl:    spironolactone  (ALDACTONE ) 25 MG tablet, TAKE ONE (1) TABLET BY MOUTH EVERY DAY, Disp: 30 tablet, Rfl: 2  "

## 2025-02-15 ENCOUNTER — Encounter: Payer: Self-pay | Admitting: Family Medicine
